# Patient Record
Sex: Male | Born: 1960 | Race: Black or African American | Hispanic: No | Marital: Single | Smoking: Current every day smoker
Health system: Southern US, Community
[De-identification: ages and names within clinical notes are randomized; demographics above are authoritative.]

## PROBLEM LIST (undated history)

## (undated) DIAGNOSIS — E872 Acidosis: Secondary | ICD-10-CM

## (undated) DIAGNOSIS — Z923 Personal history of irradiation: Secondary | ICD-10-CM

## (undated) DIAGNOSIS — J449 Chronic obstructive pulmonary disease, unspecified: Secondary | ICD-10-CM

## (undated) DIAGNOSIS — D649 Anemia, unspecified: Secondary | ICD-10-CM

## (undated) DIAGNOSIS — M1990 Unspecified osteoarthritis, unspecified site: Secondary | ICD-10-CM

## (undated) HISTORY — PX: LACERATION REPAIR: SHX5168

---
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
## 2011-10-17 ENCOUNTER — Emergency Department (HOSPITAL_COMMUNITY): Payer: Self-pay

## 2011-10-17 ENCOUNTER — Other Ambulatory Visit (HOSPITAL_COMMUNITY): Payer: Self-pay

## 2011-10-17 ENCOUNTER — Inpatient Hospital Stay (HOSPITAL_COMMUNITY)
Admission: EM | Admit: 2011-10-17 | Discharge: 2011-10-20 | DRG: 964 | Disposition: A | Discharge status: Home Health | Payer: Self-pay | Attending: General Surgery | Admitting: General Surgery

## 2011-10-17 DIAGNOSIS — S27329A Contusion of lung, unspecified, initial encounter: Principal | ICD-10-CM | POA: Diagnosis present

## 2011-10-17 DIAGNOSIS — IMO0002 Reserved for concepts with insufficient information to code with codable children: Secondary | ICD-10-CM

## 2011-10-17 DIAGNOSIS — F1010 Alcohol abuse, uncomplicated: Secondary | ICD-10-CM | POA: Diagnosis present

## 2011-10-17 DIAGNOSIS — S060X9A Concussion with loss of consciousness of unspecified duration, initial encounter: Secondary | ICD-10-CM | POA: Diagnosis present

## 2011-10-17 DIAGNOSIS — E041 Nontoxic single thyroid nodule: Secondary | ICD-10-CM | POA: Diagnosis present

## 2011-10-17 DIAGNOSIS — S7290XA Unspecified fracture of unspecified femur, initial encounter for closed fracture: Secondary | ICD-10-CM | POA: Diagnosis present

## 2011-10-17 DIAGNOSIS — D62 Acute posthemorrhagic anemia: Secondary | ICD-10-CM | POA: Diagnosis not present

## 2011-10-17 DIAGNOSIS — Z23 Encounter for immunization: Secondary | ICD-10-CM

## 2011-10-17 DIAGNOSIS — F17200 Nicotine dependence, unspecified, uncomplicated: Secondary | ICD-10-CM | POA: Diagnosis present

## 2011-10-17 DIAGNOSIS — Y92410 Unspecified street and highway as the place of occurrence of the external cause: Secondary | ICD-10-CM

## 2011-10-17 DIAGNOSIS — E876 Hypokalemia: Secondary | ICD-10-CM | POA: Diagnosis not present

## 2011-10-17 DIAGNOSIS — Y9301 Activity, walking, marching and hiking: Secondary | ICD-10-CM

## 2011-10-17 DIAGNOSIS — E871 Hypo-osmolality and hyponatremia: Secondary | ICD-10-CM | POA: Diagnosis not present

## 2011-10-17 LAB — ABO/RH: ABO/RH(D): O POS

## 2011-10-17 LAB — PROTIME-INR
INR: 1.02 (ref 0.00–1.49)
Prothrombin Time: 13.6 seconds (ref 11.6–15.2)

## 2011-10-17 LAB — CBC
HCT: 33.6 % — ABNORMAL LOW (ref 39.0–52.0)
Hemoglobin: 11.2 g/dL — ABNORMAL LOW (ref 13.0–17.0)
MCH: 31.5 pg (ref 26.0–34.0)
Platelets: 282 10*3/uL (ref 150–400)
RDW: 11.7 % (ref 11.5–15.5)
WBC: 5.2 10*3/uL (ref 4.0–10.5)

## 2011-10-17 LAB — POCT I-STAT, CHEM 8
Calcium, Ion: 1.1 mmol/L — ABNORMAL LOW (ref 1.12–1.32)
Chloride: 102 mEq/L (ref 96–112)
Creatinine, Ser: 1.3 mg/dL (ref 0.50–1.35)
HCT: 38 % — ABNORMAL LOW (ref 39.0–52.0)
Hemoglobin: 12.9 g/dL — ABNORMAL LOW (ref 13.0–17.0)
Potassium: 3.6 mEq/L (ref 3.5–5.1)
TCO2: 24 mmol/L (ref 0–100)

## 2011-10-17 LAB — RAPID URINE DRUG SCREEN, HOSP PERFORMED
Amphetamines: NOT DETECTED
Benzodiazepines: NOT DETECTED
Tetrahydrocannabinol: POSITIVE — AB

## 2011-10-17 LAB — URINALYSIS, ROUTINE W REFLEX MICROSCOPIC
Ketones, ur: NEGATIVE mg/dL
Leukocytes, UA: NEGATIVE
Nitrite: NEGATIVE
Protein, ur: NEGATIVE mg/dL
Urobilinogen, UA: 0.2 mg/dL (ref 0.0–1.0)

## 2011-10-17 LAB — COMPREHENSIVE METABOLIC PANEL
ALT: 14 U/L (ref 0–53)
AST: 25 U/L (ref 0–37)
BUN: 10 mg/dL (ref 6–23)
CO2: 25 mEq/L (ref 19–32)
Calcium: 8.8 mg/dL (ref 8.4–10.5)
Creatinine, Ser: 0.93 mg/dL (ref 0.50–1.35)
GFR calc Af Amer: >90 mL/min (ref >90)
GFR calc non Af Amer: >90 mL/min (ref >90)
Glucose, Bld: 120 mg/dL — ABNORMAL HIGH (ref 70–99)

## 2011-10-17 LAB — URINE MICROSCOPIC-ADD ON

## 2011-10-17 LAB — LACTIC ACID, PLASMA: Lactic Acid, Venous: 1.6 mmol/L (ref 0.5–2.2)

## 2011-10-17 LAB — TYPE AND SCREEN: ABO/RH(D): O POS

## 2011-10-17 MED ORDER — IOHEXOL 300 MG/ML  SOLN
100.0000 mL | Freq: Once | INTRAMUSCULAR | Status: AC | PRN
  Administered 2011-10-17: 100 mL via INTRAVENOUS

## 2011-10-18 ENCOUNTER — Emergency Department (HOSPITAL_COMMUNITY): Payer: Self-pay

## 2011-10-18 ENCOUNTER — Inpatient Hospital Stay (HOSPITAL_COMMUNITY): Payer: Self-pay

## 2011-10-18 LAB — BASIC METABOLIC PANEL
CO2: 27 mEq/L (ref 19–32)
Calcium: 8.1 mg/dL — ABNORMAL LOW (ref 8.4–10.5)
Chloride: 100 mEq/L (ref 96–112)
Creatinine, Ser: 0.79 mg/dL (ref 0.50–1.35)
GFR calc Af Amer: >90 mL/min (ref >90)
Sodium: 135 mEq/L (ref 135–145)

## 2011-10-18 LAB — CBC
Platelets: 241 10*3/uL (ref 150–400)
RBC: 3.13 MIL/uL — ABNORMAL LOW (ref 4.22–5.81)
RDW: 11.6 % (ref 11.5–15.5)
WBC: 6.6 10*3/uL (ref 4.0–10.5)

## 2011-10-19 LAB — CBC
HCT: 27 % — ABNORMAL LOW (ref 39.0–52.0)
MCV: 94.7 fL (ref 78.0–100.0)
Platelets: 225 10*3/uL (ref 150–400)
RBC: 2.85 MIL/uL — ABNORMAL LOW (ref 4.22–5.81)
RDW: 11.6 % (ref 11.5–15.5)
WBC: 5.1 10*3/uL (ref 4.0–10.5)

## 2011-10-19 LAB — BASIC METABOLIC PANEL
BUN: 7 mg/dL (ref 6–23)
CO2: 28 mEq/L (ref 19–32)
Chloride: 95 mEq/L — ABNORMAL LOW (ref 96–112)
Creatinine, Ser: 0.71 mg/dL (ref 0.50–1.35)
GFR calc Af Amer: >90 mL/min (ref >90)
Potassium: 3.3 mEq/L — ABNORMAL LOW (ref 3.5–5.1)

## 2011-10-20 LAB — BASIC METABOLIC PANEL
CO2: 27 mEq/L (ref 19–32)
Calcium: 8.1 mg/dL — ABNORMAL LOW (ref 8.4–10.5)
Chloride: 99 mEq/L (ref 96–112)
Glucose, Bld: 129 mg/dL — ABNORMAL HIGH (ref 70–99)
Sodium: 133 mEq/L — ABNORMAL LOW (ref 135–145)

## 2011-10-20 LAB — CBC
HCT: 25.4 % — ABNORMAL LOW (ref 39.0–52.0)
MCH: 32.5 pg (ref 26.0–34.0)
MCV: 93.7 fL (ref 78.0–100.0)
Platelets: 239 10*3/uL (ref 150–400)
RBC: 2.71 MIL/uL — ABNORMAL LOW (ref 4.22–5.81)
WBC: 6.3 10*3/uL (ref 4.0–10.5)
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
## 2011-10-30 ENCOUNTER — Telehealth (INDEPENDENT_AMBULATORY_CARE_PROVIDER_SITE_OTHER): Payer: Self-pay | Admitting: Physician Assistant

## 2011-10-30 NOTE — Telephone Encounter (Signed)
Pt states he is having pain in ribs and in leg, and is coughing up some rusty colored sputum. He has not been running a fever and does not feel like like he is getting pneumonia. He has a Rx for pain medication, but cannot afford to buy it and wonders if we could either give him some medication or help pay for it. I let him know that we could not help him this way, but that he could apply for emergency assistance at social services. The pt will call us back should we be able to do anything further for him

## 2011-11-03 ENCOUNTER — Telehealth (INDEPENDENT_AMBULATORY_CARE_PROVIDER_SITE_OTHER): Payer: Self-pay | Admitting: General Surgery

## 2011-11-03 NOTE — Telephone Encounter (Signed)
Perry Waller of [facility] called, requested a verbal order for social work from Dr. Lindie Spruce, pt wanted to apply of medicaid at this time. Trey Paula asked if he could be called back at [phone].

## 2011-11-03 NOTE — Telephone Encounter (Signed)
I called Trey Paula back from [facility] and gave verbal order to go out for social visit to discuss medicaid to help with medicine expense.

## 2011-11-06 ENCOUNTER — Emergency Department (HOSPITAL_COMMUNITY): Payer: Self-pay

## 2011-11-06 ENCOUNTER — Emergency Department (HOSPITAL_COMMUNITY)
Admission: EM | Admit: 2011-11-06 | Discharge: 2011-11-06 | Disposition: A | Discharge status: Home / Self Care | Payer: Self-pay | Attending: Emergency Medicine | Admitting: Emergency Medicine

## 2011-11-06 ENCOUNTER — Encounter: Payer: Self-pay | Admitting: Emergency Medicine

## 2011-11-06 DIAGNOSIS — S2242XA Multiple fractures of ribs, left side, initial encounter for closed fracture: Secondary | ICD-10-CM

## 2011-11-06 DIAGNOSIS — R079 Chest pain, unspecified: Secondary | ICD-10-CM | POA: Insufficient documentation

## 2011-11-06 DIAGNOSIS — S2249XA Multiple fractures of ribs, unspecified side, initial encounter for closed fracture: Secondary | ICD-10-CM | POA: Insufficient documentation

## 2011-11-06 DIAGNOSIS — IMO0002 Reserved for concepts with insufficient information to code with codable children: Secondary | ICD-10-CM | POA: Insufficient documentation

## 2011-11-06 DIAGNOSIS — M79609 Pain in unspecified limb: Secondary | ICD-10-CM | POA: Insufficient documentation

## 2011-11-06 MED ORDER — OXYCODONE-ACETAMINOPHEN 5-325 MG PO TABS
1.0000 | ORAL_TABLET | Freq: Once | ORAL | Status: AC
  Administered 2011-11-06: 1 via ORAL
  Filled 2011-11-06: qty 1

## 2011-11-06 MED ORDER — OXYCODONE-ACETAMINOPHEN 7.5-325 MG PO TABS: 1.0000 | ORAL_TABLET | Freq: Three times a day (TID) | ORAL | Status: DC | PRN
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
## 2012-07-06 IMAGING — CR DG RIBS W/ CHEST 3+V*L*
4 series · 4 of 4 positions shown · non-contrast
Comparison: 10/26/2011

CLINICAL DATA: Left rib pain

LEFT RIBS AND CHEST - 3+ VIEW

[w chest pa]
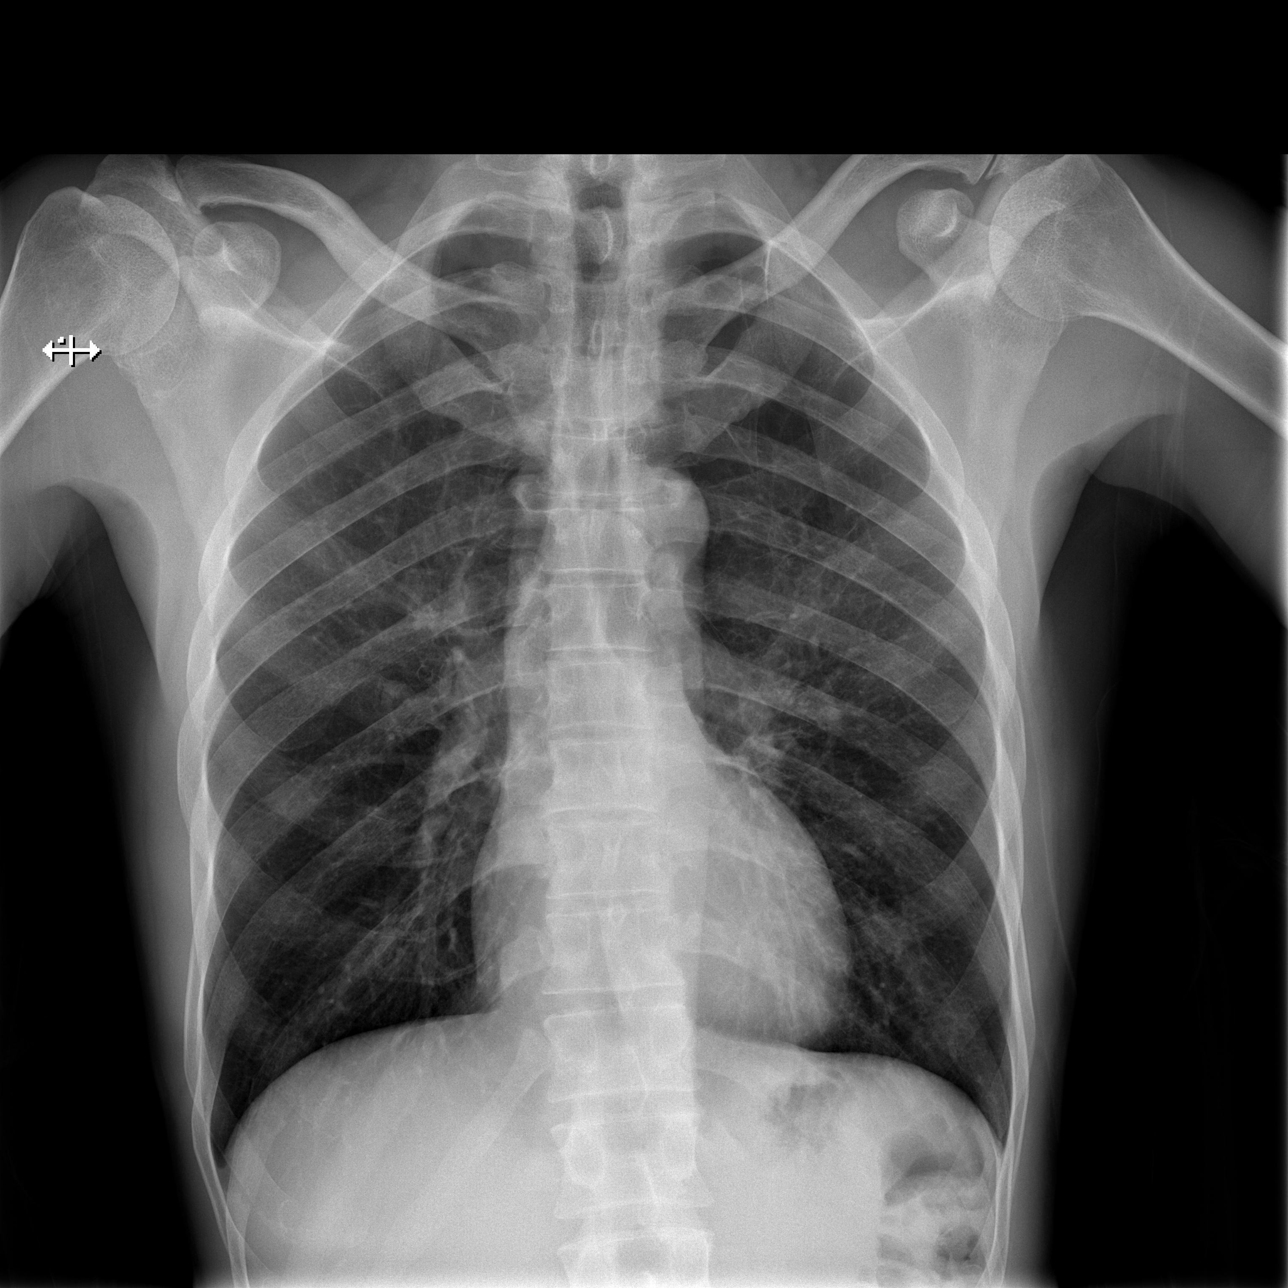

[w ribs ap upper left]
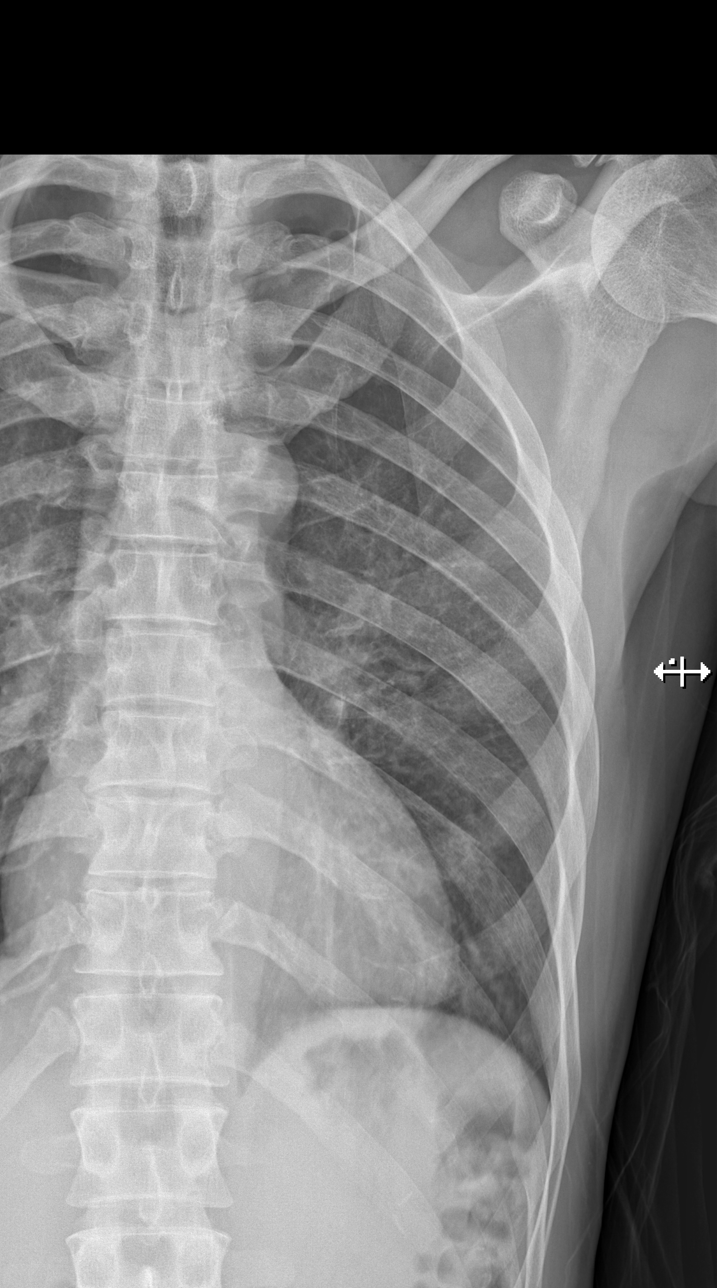

[w ribs ap lower left]
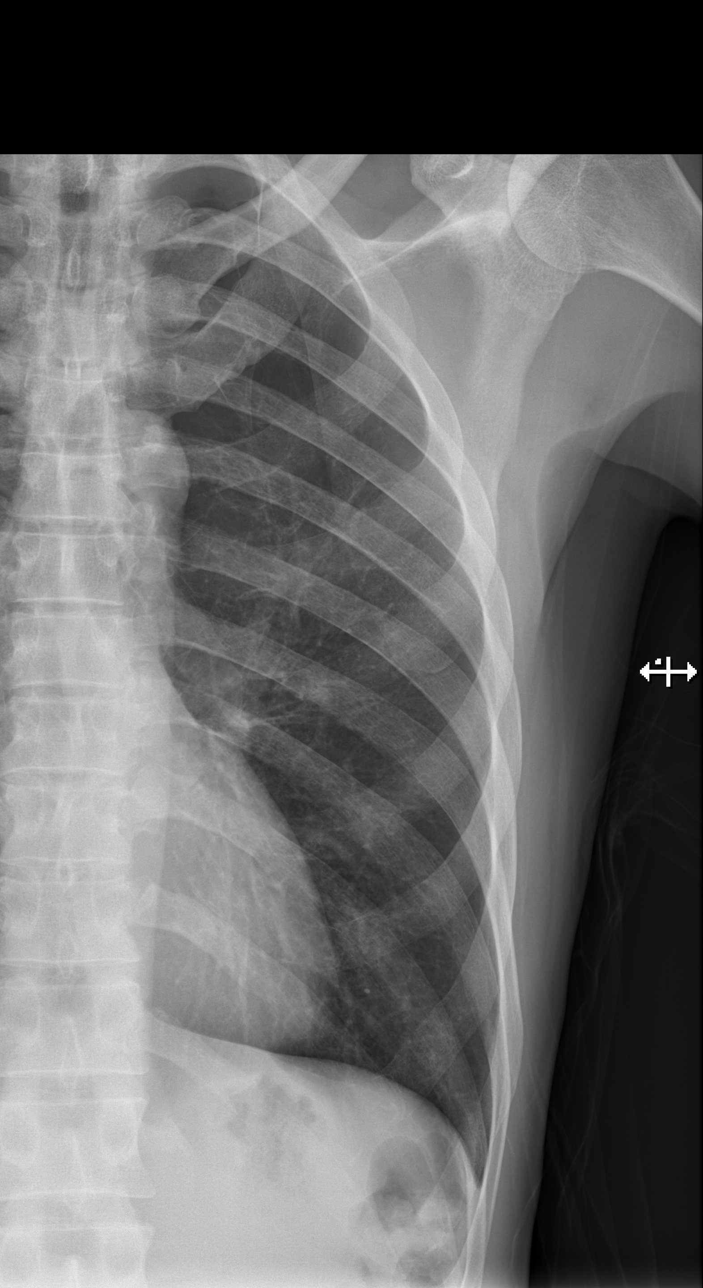

[w ribs obl left]
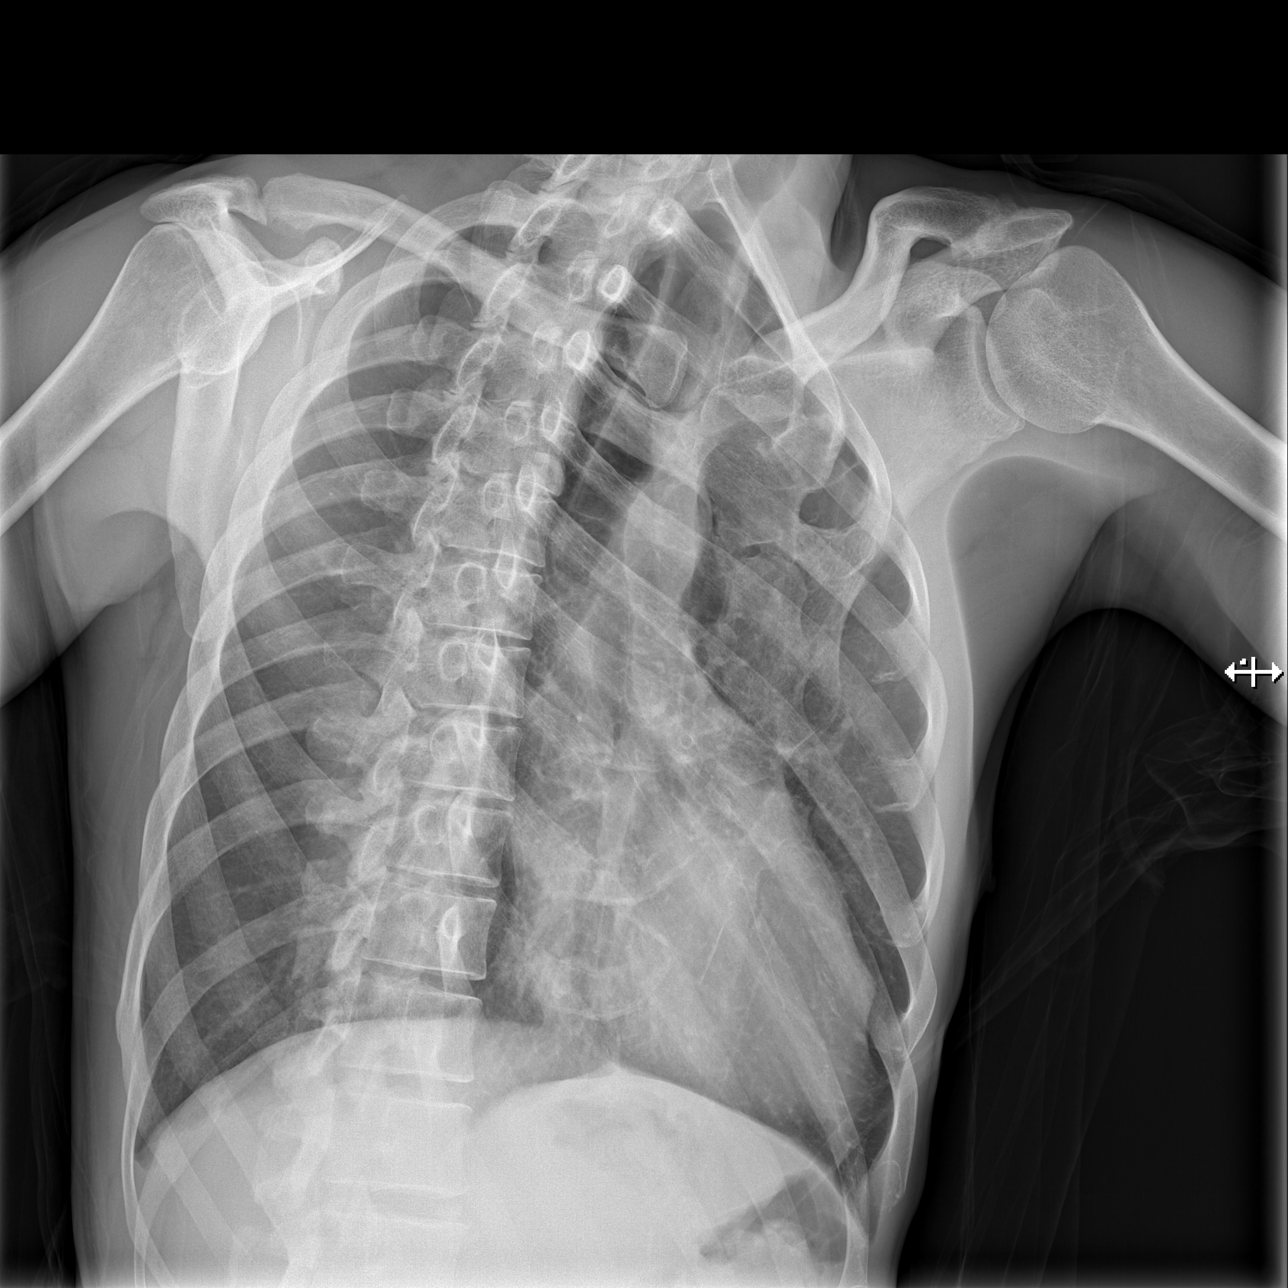

[4 of 4 positions shown; findings below may reference images not displayed]

FINDINGS: No pneumothorax.  No effusion.  Lungs clear.  Heart size
normal.  Minimally-displaced fractures of the posterolateral aspect
left ninth and tenth ribs.
IMPRESSION: 1.  Left ninth and tenth rib fractures without pneumothorax or
effusion.

## 2019-05-17 ENCOUNTER — Other Ambulatory Visit: Payer: Self-pay

## 2019-05-17 ENCOUNTER — Ambulatory Visit (INDEPENDENT_AMBULATORY_CARE_PROVIDER_SITE_OTHER): Payer: Medicaid Other

## 2019-05-17 ENCOUNTER — Ambulatory Visit (HOSPITAL_COMMUNITY)
Admission: EM | Admit: 2019-05-17 | Discharge: 2019-05-17 | Disposition: A | Discharge status: Home / Self Care | Payer: Medicaid Other | Attending: Family Medicine | Admitting: Family Medicine

## 2019-05-17 ENCOUNTER — Encounter (HOSPITAL_COMMUNITY): Payer: Self-pay | Admitting: Emergency Medicine

## 2019-05-17 DIAGNOSIS — J069 Acute upper respiratory infection, unspecified: Secondary | ICD-10-CM

## 2019-05-17 DIAGNOSIS — R05 Cough: Secondary | ICD-10-CM | POA: Diagnosis not present

## 2019-05-17 DIAGNOSIS — B9789 Other viral agents as the cause of diseases classified elsewhere: Secondary | ICD-10-CM

## 2019-05-17 MED ORDER — BENZONATATE 100 MG PO CAPS: ORAL_CAPSULE | ORAL | 0 refills | Status: DC

## 2019-05-17 NOTE — ED Provider Notes (Signed)
[name]   [id number] 05/17/19 Arrival Time: 6734  ASSESSMENT & PLAN:  1. Viral URI with cough    See AVS for discharge instructions.  I have personally viewed the imaging studies ordered this visit. No signs of pneumonia. Normal.  Meds ordered this encounter  Medications  . benzonatate (TESSALON) 100 MG capsule    Sig: Take 1 capsule by mouth every 8 (eight) hours for cough.    Dispense:  21 capsule    Refill:  0   Low suspicion for COVID-19. He does not wish to discuss any more details about his current symptoms; reports "I'm too hungry and have to leave." OTC symptom care as needed. Ensure adequate fluid intake and rest. May f/u with PCP or here as needed.  Reviewed expectations re: course of current medical issues. Questions answered. Outlined signs and symptoms indicating need for more acute intervention. Patient verbalized understanding. After Visit Summary given.   SUBJECTIVE: History from: patient.   Perry Waller is a 58 y.o. male who presents with complaint of nasal congestion, post-nasal drainage, and a persistent dry cough; without sore throat. Onset abrupt, a week ago; with mild fatigue and without body aches. SOB: "Maybe when I cough a lot and sometimes at night". Wheezing: none suspected. Fever: none suspected. Overall normal PO intake without n/v. Known sick contacts: no. No suspected COVID-19 exposure.No specific or significant aggravating or alleviating factors reported. OTC treatment: none reported.  Social History   Tobacco Use  Smoking Status Current Every Day Smoker  Cigarettes.  ROS: As per HPI. All other systems negative.   OBJECTIVE:  Vitals:   05/17/19 1117  BP: (!) 137/101  Pulse: 99  Resp: (!) 22  Temp: (!) 97.5 F (36.4 C)  TempSrc: Temporal  SpO2: 99%    Recheck RR: 18  General appearance: alert; no distress HEENT: mild nasal congestion; clear runny nose; throat irritation/cobblestoning; uvula midline Neck: supple  without LAD CV: RRR Lungs: unlabored respirations, symmetrical air entry without wheezing; cough: mild and dry Abd: soft; non-tender Ext: no LE edema Skin: warm and dry Neuro: normal ambulation Psychological: alert and cooperative; normal mood and affect  Imaging: Dg Chest 2 View  Result Date: 05/17/2019 CLINICAL DATA:  Cough. EXAM: CHEST - 2 VIEW COMPARISON:  Radiographs of November 06, 2011. FINDINGS: The heart size and mediastinal contours are within normal limits. Both lungs are clear. No pneumothorax or pleural effusion is noted. The visualized skeletal structures are unremarkable. IMPRESSION: No active cardiopulmonary disease. Electronically Signed   By: Marijo Conception M.D.   On: 05/17/2019 12:04    No Known Allergies  PMH: Rib fractures.  FH: Question of HTN.  Social History   Socioeconomic History  . Marital status: Single    Spouse name: Not on file  . Number of children: Not on file  . Years of education: Not on file  . Highest education level: Not on file  Occupational History  . Not on file  Social Needs  . Financial resource strain: Not on file  . Food insecurity:    Worry: Not on file    Inability: Not on file  . Transportation needs:    Medical: Not on file    Non-medical: Not on file  Tobacco Use  . Smoking status: Current Every Day Smoker  Substance and Sexual Activity  . Alcohol use: No  . Drug use: No  . Sexual activity: Not on file  Lifestyle  . Physical activity:    Days  per week: Not on file    Minutes per session: Not on file  . Stress: Not on file  Relationships  . Social connections:    Talks on phone: Not on file    Gets together: Not on file    Attends religious service: Not on file    Active member of club or organization: Not on file    Attends meetings of clubs or organizations: Not on file    Relationship status: Not on file  . Intimate partner violence:    Fear of current or ex partner: Not on file    Emotionally abused: Not  on file    Physically abused: Not on file    Forced sexual activity: Not on file  Other Topics Concern  . Not on file  Social History Narrative  . Not on file           Perry Kick, MD 05/17/19 1349

## 2019-05-17 NOTE — ED Triage Notes (Signed)
Pt here for cough and congestion with body aches and hoarse voice

## 2019-05-17 NOTE — Discharge Instructions (Signed)
Follow up with your primary care doctor or here if you are not seeing improvement of your symptoms over the next several days, sooner if you feel you are worsening.  Caring for yourself: Get plenty of rest. Drink plenty of fluids, enough so that your urine is light yellow or clear like water. If you have kidney, heart, or liver disease and have to limit fluids, talk with your doctor before you increase the amount of fluids you drink. Take an over-the-counter pain medicine if needed, such as acetaminophen (Tylenol), ibuprofen (Advil, Motrin), or naproxen (Aleve), to relieve fever, headache, and muscle aches. Read and follow all instructions on the label. No one younger than 20 should take aspirin. It has been linked to Reye syndrome, a serious illness. Before you use over the counter cough and cold medicines, check the label. These medicines may not be safe for children younger than age 48 or for people with certain health problems. If the skin around your nose and lips becomes sore, put some petroleum jelly on the area.  Avoid spreading a respiratory virus: Wash your hands regularly, and keep your hands away from your face.  Stay home from school, work, and other public places until you are feeling better and your fever has been gone for at least 24 hours. The fever needs to have gone away on its own without the help of medicine.

## 2019-06-16 ENCOUNTER — Emergency Department (HOSPITAL_COMMUNITY): Payer: Medicaid Other

## 2019-06-16 ENCOUNTER — Other Ambulatory Visit: Payer: Self-pay

## 2019-06-16 ENCOUNTER — Encounter (HOSPITAL_COMMUNITY): Payer: Self-pay | Admitting: Emergency Medicine

## 2019-06-16 ENCOUNTER — Emergency Department (HOSPITAL_COMMUNITY)
Admission: EM | Admit: 2019-06-16 | Discharge: 2019-06-16 | Disposition: A | Discharge status: Home / Self Care | Payer: Medicaid Other | Attending: Emergency Medicine | Admitting: Emergency Medicine

## 2019-06-16 DIAGNOSIS — R918 Other nonspecific abnormal finding of lung field: Secondary | ICD-10-CM | POA: Insufficient documentation

## 2019-06-16 DIAGNOSIS — R059 Cough, unspecified: Secondary | ICD-10-CM

## 2019-06-16 DIAGNOSIS — R05 Cough: Secondary | ICD-10-CM | POA: Diagnosis present

## 2019-06-16 DIAGNOSIS — R9389 Abnormal findings on diagnostic imaging of other specified body structures: Secondary | ICD-10-CM

## 2019-06-16 LAB — GROUP A STREP BY PCR: Group A Strep by PCR: NOT DETECTED

## 2019-06-16 MED ORDER — PREDNISONE 10 MG (21) PO TBPK: ORAL_TABLET | Freq: Every day | ORAL | 0 refills | Status: DC

## 2019-06-16 MED ORDER — PREDNISONE 20 MG PO TABS
60.0000 mg | ORAL_TABLET | Freq: Once | ORAL | Status: AC
Start: 2019-06-16 — End: 2019-06-16
  Administered 2019-06-16: 60 mg via ORAL
  Filled 2019-06-16: qty 3

## 2019-06-16 MED ORDER — ALBUTEROL SULFATE HFA 108 (90 BASE) MCG/ACT IN AERS
1.0000 | INHALATION_SPRAY | Freq: Once | RESPIRATORY_TRACT | Status: AC
  Administered 2019-06-16: 1 via RESPIRATORY_TRACT
  Filled 2019-06-16: qty 6.7

## 2019-06-16 MED ORDER — ALBUTEROL SULFATE (2.5 MG/3ML) 0.083% IN NEBU
5.0000 mg | INHALATION_SOLUTION | Freq: Once | RESPIRATORY_TRACT | Status: AC
Start: 2019-06-16 — End: 2019-06-16
  Administered 2019-06-16: 5 mg via RESPIRATORY_TRACT
  Filled 2019-06-16: qty 6

## 2019-06-16 NOTE — Discharge Instructions (Addendum)
You have an abnormality on the right side of your chest that was seen on your x-ray which could be cancer.  You need to have a CAT scan of your chest performed within the next 1 to 2 months to exclude this.

## 2019-06-16 NOTE — ED Provider Notes (Signed)
[facility] DEPT Provider Note   CSN: [id number] Arrival date & time: 06/16/19  2010     History   Chief Complaint Chief Complaint  Patient presents with  . Cough  . Hoarse  . Sore Throat    HPI Perry Waller is a 58 y.o. male.     58 year old male presents with 2 weeks of sore throat along with nonproductive cough and congestion.  Denies any fever or chills.  No trouble swallowing.  Has been using his albuterol inhaler without relief.  Does have a greater than 40-pack-year history tobacco use.  No hemoptysis.  No weight loss.  Denies any prior history of COPD.     History reviewed. No pertinent past medical history.  There are no active problems to display for this patient.   History reviewed. No pertinent surgical history.      Home Medications    Prior to Admission medications   Medication Sig Start Date End Date Taking? Authorizing Provider  benzonatate (TESSALON) 100 MG capsule Take 1 capsule by mouth every 8 (eight) hours for cough. 05/17/19   Vanessa Kick, MD  oxyCODONE-acetaminophen (PERCOCET) 7.5-325 MG per tablet Take 1 tablet by mouth every 8 (eight) hours as needed. For pain Patient not taking: Reported on 05/17/2019 11/06/11   Dorie Rank, MD    Family History No family history on file.  Social History Social History   Tobacco Use  . Smoking status: Current Every Day Smoker  . Smokeless tobacco: Never Used  Substance Use Topics  . Alcohol use: No  . Drug use: No     Allergies   Patient has no known allergies.   Review of Systems Review of Systems  All other systems reviewed and are negative.    Physical Exam Updated Vital Signs BP (!) 157/101 (BP Location: Right Arm)   Pulse 96   Temp 98.4 F (36.9 C) (Oral)   Resp 18   Ht 1.803 m (5\' 11" )   Wt 58.1 kg   SpO2 99%   BMI 17.85 kg/m   Physical Exam Vitals signs and nursing note reviewed.  Constitutional:      General: He is not in acute distress.    Appearance: Normal appearance. He is well-developed. He is not toxic-appearing.  HENT:     Head: Normocephalic and atraumatic.     Mouth/Throat:     Dentition: Dental caries present.  Eyes:     General: Lids are normal.     Conjunctiva/sclera: Conjunctivae normal.     Pupils: Pupils are equal, round, and reactive to light.  Neck:     Musculoskeletal: Normal range of motion and neck supple.     Thyroid: No thyroid mass.     Trachea: No tracheal deviation.  Cardiovascular:     Rate and Rhythm: Normal rate and regular rhythm.     Heart sounds: Normal heart sounds. No murmur. No gallop.   Pulmonary:     Effort: Pulmonary effort is normal. No respiratory distress.     Breath sounds: No stridor. Examination of the right-upper field reveals decreased breath sounds. Examination of the left-upper field reveals decreased breath sounds. Decreased breath sounds present. No wheezing, rhonchi or rales.  Abdominal:     General: Bowel sounds are normal. There is no distension.     Palpations: Abdomen is soft.     Tenderness: There is no abdominal tenderness. There is no rebound.  Musculoskeletal: Normal range of motion.  General: No tenderness.  Skin:    General: Skin is warm and dry.     Findings: No abrasion or rash.  Neurological:     Mental Status: He is alert and oriented to person, place, and time.     GCS: GCS eye subscore is 4. GCS verbal subscore is 5. GCS motor subscore is 6.     Cranial Nerves: No cranial nerve deficit.     Sensory: No sensory deficit.  Psychiatric:        Speech: Speech normal.        Behavior: Behavior normal.      ED Treatments / Results  Labs (all labs ordered are listed, but only abnormal results are displayed) Labs Reviewed  GROUP A STREP BY PCR    EKG    Radiology Dg Chest 2 View  Result Date: 06/16/2019 CLINICAL DATA:  Cough EXAM: CHEST - 2 VIEW COMPARISON:  05/17/2019 FINDINGS: Pulmonary hyperinflation. No pleural effusion. Normal  cardiomediastinal silhouette. Linear scarring in the right upper lobe. Bullous emphysematous disease. Possible small spiculated nodule in the right upper lobe. IMPRESSION: 1. Hyperinflation with emphysematous disease. No acute pulmonary infiltrate 2. Possible small spiculated right upper lobe lung nodule. CT chest could be obtained to further evaluate. Electronically Signed   By: Donavan Foil M.D.   On: 06/16/2019 21:37    Procedures Procedures (including critical care time)  Medications Ordered in ED Medications  predniSONE (DELTASONE) tablet 60 mg (has no administration in time range)  albuterol (VENTOLIN HFA) 108 (90 Base) MCG/ACT inhaler 1 puff (has no administration in time range)  albuterol (PROVENTIL) (2.5 MG/3ML) 0.083% nebulizer solution 5 mg (5 mg Nebulization Given 06/16/19 2044)     Initial Impression / Assessment and Plan / ED Course  I have reviewed the triage vital signs and the nursing notes.  Pertinent labs & imaging results that were available during my care of the patient were reviewed by me and considered in my medical decision making (see chart for details).       Rapid strep test was negative.  Chest x-ray does show a lung spicule and the possibility of lung cancer was explained to him in the importance of follow-up with a CT of the chest at a later date. Patient with mild expiratory wheezing here.  Has been treated with albuterol prior to me seeing the patient he feels much better.  Suspect mild COPD exacerbation and will be given albuterol HFA to go home with.  Was also dose of prednisone will be placed on taper.  Final Clinical Impressions(s) / ED Diagnoses   Final diagnoses:  None    ED Discharge Orders    None       Lacretia Leigh, MD 06/16/19 2154

## 2019-06-16 NOTE — ED Triage Notes (Signed)
Patient here from home with complaints of dry cough worse at night, sore throat, and wheezing x 1 month. Reports being seen at Urgent Care for same 1 month ago. No relief. Smoker. Denies COVID exposure.

## 2019-06-29 ENCOUNTER — Encounter (HOSPITAL_COMMUNITY): Payer: Self-pay | Admitting: *Deleted

## 2019-06-29 ENCOUNTER — Other Ambulatory Visit: Payer: Self-pay

## 2019-06-29 ENCOUNTER — Emergency Department (HOSPITAL_COMMUNITY): Payer: Medicaid Other

## 2019-06-29 ENCOUNTER — Observation Stay (HOSPITAL_COMMUNITY)
Admission: EM | Admit: 2019-06-29 | Discharge: 2019-06-30 | Disposition: A | Discharge status: Home / Self Care | Payer: Medicaid Other | Attending: Internal Medicine | Admitting: Internal Medicine

## 2019-06-29 DIAGNOSIS — J439 Emphysema, unspecified: Secondary | ICD-10-CM | POA: Insufficient documentation

## 2019-06-29 DIAGNOSIS — J441 Chronic obstructive pulmonary disease with (acute) exacerbation: Secondary | ICD-10-CM | POA: Diagnosis present

## 2019-06-29 DIAGNOSIS — J9601 Acute respiratory failure with hypoxia: Secondary | ICD-10-CM

## 2019-06-29 DIAGNOSIS — E872 Acidosis: Secondary | ICD-10-CM

## 2019-06-29 DIAGNOSIS — E871 Hypo-osmolality and hyponatremia: Secondary | ICD-10-CM

## 2019-06-29 DIAGNOSIS — I252 Old myocardial infarction: Secondary | ICD-10-CM | POA: Diagnosis not present

## 2019-06-29 DIAGNOSIS — R911 Solitary pulmonary nodule: Secondary | ICD-10-CM | POA: Diagnosis not present

## 2019-06-29 DIAGNOSIS — Z1159 Encounter for screening for other viral diseases: Secondary | ICD-10-CM | POA: Insufficient documentation

## 2019-06-29 DIAGNOSIS — R9431 Abnormal electrocardiogram [ECG] [EKG]: Secondary | ICD-10-CM | POA: Insufficient documentation

## 2019-06-29 DIAGNOSIS — E876 Hypokalemia: Secondary | ICD-10-CM

## 2019-06-29 DIAGNOSIS — R0603 Acute respiratory distress: Secondary | ICD-10-CM | POA: Diagnosis not present

## 2019-06-29 DIAGNOSIS — J449 Chronic obstructive pulmonary disease, unspecified: Secondary | ICD-10-CM

## 2019-06-29 DIAGNOSIS — F17210 Nicotine dependence, cigarettes, uncomplicated: Secondary | ICD-10-CM | POA: Insufficient documentation

## 2019-06-29 DIAGNOSIS — J9602 Acute respiratory failure with hypercapnia: Secondary | ICD-10-CM

## 2019-06-29 DIAGNOSIS — Z79899 Other long term (current) drug therapy: Secondary | ICD-10-CM | POA: Insufficient documentation

## 2019-06-29 HISTORY — DX: Unspecified osteoarthritis, unspecified site: M19.90

## 2019-06-29 HISTORY — DX: Acute respiratory failure with hypercapnia: J96.02

## 2019-06-29 HISTORY — DX: Acidosis: E87.2

## 2019-06-29 LAB — CBC WITH DIFFERENTIAL/PLATELET
Abs Immature Granulocytes: 0.1 10*3/uL — ABNORMAL HIGH (ref 0.00–0.07)
Basophils Absolute: 0.1 10*3/uL (ref 0.0–0.1)
Basophils Relative: 0 %
Eosinophils Absolute: 0.1 10*3/uL (ref 0.0–0.5)
Eosinophils Relative: 1 %
HCT: 42.2 % (ref 39.0–52.0)
Hemoglobin: 13.2 g/dL (ref 13.0–17.0)
Immature Granulocytes: 1 %
Lymphocytes Relative: 61 %
Lymphs Abs: 8 10*3/uL — ABNORMAL HIGH (ref 0.7–4.0)
MCH: 31 pg (ref 26.0–34.0)
MCHC: 31.3 g/dL (ref 30.0–36.0)
MCV: 99.1 fL (ref 80.0–100.0)
Monocytes Absolute: 0.9 10*3/uL (ref 0.1–1.0)
Monocytes Relative: 7 %
Neutro Abs: 3.9 10*3/uL (ref 1.7–7.7)
Neutrophils Relative %: 30 %
Platelets: 344 10*3/uL (ref 150–400)
RBC: 4.26 MIL/uL (ref 4.22–5.81)
RDW: 11.9 % (ref 11.5–15.5)
WBC: 13 10*3/uL — ABNORMAL HIGH (ref 4.0–10.5)
nRBC: 0 % (ref 0.0–0.2)

## 2019-06-29 LAB — MAGNESIUM: Magnesium: 1.8 mg/dL (ref 1.7–2.4)

## 2019-06-29 LAB — BASIC METABOLIC PANEL
Anion gap: 18 — ABNORMAL HIGH (ref 5–15)
BUN: 6 mg/dL (ref 6–20)
CO2: 22 mmol/L (ref 22–32)
Calcium: 8.8 mg/dL — ABNORMAL LOW (ref 8.9–10.3)
Chloride: 92 mmol/L — ABNORMAL LOW (ref 98–111)
Creatinine, Ser: 0.8 mg/dL (ref 0.61–1.24)
GFR calc Af Amer: >60 mL/min (ref >60)
GFR calc non Af Amer: >60 mL/min (ref >60)
Glucose, Bld: 181 mg/dL — ABNORMAL HIGH (ref 70–99)
Potassium: 3.1 mmol/L — ABNORMAL LOW (ref 3.5–5.1)
Sodium: 132 mmol/L — ABNORMAL LOW (ref 135–145)

## 2019-06-29 LAB — PATHOLOGIST SMEAR REVIEW

## 2019-06-29 LAB — HEMOGLOBIN A1C
Hgb A1c MFr Bld: 5.7 % — ABNORMAL HIGH (ref 4.8–5.6)
Mean Plasma Glucose: 116.89 mg/dL

## 2019-06-29 LAB — SARS CORONAVIRUS 2 BY RT PCR (HOSPITAL ORDER, PERFORMED IN ~~LOC~~ HOSPITAL LAB): SARS Coronavirus 2: NEGATIVE

## 2019-06-29 MED ORDER — SENNOSIDES-DOCUSATE SODIUM 8.6-50 MG PO TABS: 1.0000 | ORAL_TABLET | Freq: Every evening | ORAL | Status: DC | PRN

## 2019-06-29 MED ORDER — ENOXAPARIN SODIUM 40 MG/0.4ML ~~LOC~~ SOLN
40.0000 mg | SUBCUTANEOUS | Status: DC
  Administered 2019-06-29 – 2019-06-30 (×2): 40 mg via SUBCUTANEOUS
  Filled 2019-06-29 (×2): qty 0.4

## 2019-06-29 MED ORDER — ALBUTEROL SULFATE HFA 108 (90 BASE) MCG/ACT IN AERS
8.0000 | INHALATION_SPRAY | Freq: Once | RESPIRATORY_TRACT | Status: AC
  Administered 2019-06-29: 04:00:00 8 via RESPIRATORY_TRACT
  Filled 2019-06-29: qty 6.7

## 2019-06-29 MED ORDER — MAGNESIUM SULFATE 2 GM/50ML IV SOLN
2.0000 g | Freq: Once | INTRAVENOUS | Status: AC
  Administered 2019-06-29: 2 g via INTRAVENOUS
  Filled 2019-06-29: qty 50

## 2019-06-29 MED ORDER — IPRATROPIUM-ALBUTEROL 0.5-2.5 (3) MG/3ML IN SOLN
3.0000 mL | Freq: Four times a day (QID) | RESPIRATORY_TRACT | Status: DC
  Administered 2019-06-29 – 2019-06-30 (×6): 3 mL via RESPIRATORY_TRACT
  Filled 2019-06-29 (×6): qty 3

## 2019-06-29 MED ORDER — METHYLPREDNISOLONE SODIUM SUCC 125 MG IJ SOLR
125.0000 mg | Freq: Once | INTRAMUSCULAR | Status: AC
  Administered 2019-06-29: 05:00:00 125 mg via INTRAVENOUS
  Filled 2019-06-29: qty 2

## 2019-06-29 MED ORDER — AZITHROMYCIN 250 MG PO TABS
250.0000 mg | ORAL_TABLET | Freq: Every day | ORAL | Status: DC
  Administered 2019-06-30: 250 mg via ORAL
  Filled 2019-06-29: qty 1

## 2019-06-29 MED ORDER — IPRATROPIUM-ALBUTEROL 0.5-2.5 (3) MG/3ML IN SOLN: 3.0000 mL | Freq: Three times a day (TID) | RESPIRATORY_TRACT | Status: DC | PRN

## 2019-06-29 MED ORDER — IPRATROPIUM-ALBUTEROL 0.5-2.5 (3) MG/3ML IN SOLN
3.0000 mL | Freq: Once | RESPIRATORY_TRACT | Status: AC
  Administered 2019-06-29: 3 mL via RESPIRATORY_TRACT
  Filled 2019-06-29: qty 3

## 2019-06-29 MED ORDER — PREDNISONE 20 MG PO TABS
40.0000 mg | ORAL_TABLET | Freq: Every day | ORAL | Status: DC
  Administered 2019-06-30: 40 mg via ORAL
  Filled 2019-06-29: qty 2

## 2019-06-29 MED ORDER — ALBUTEROL SULFATE HFA 108 (90 BASE) MCG/ACT IN AERS
8.0000 | INHALATION_SPRAY | Freq: Once | RESPIRATORY_TRACT | Status: AC
  Administered 2019-06-29: 8 via RESPIRATORY_TRACT

## 2019-06-29 MED ORDER — POTASSIUM CHLORIDE CRYS ER 20 MEQ PO TBCR
40.0000 meq | EXTENDED_RELEASE_TABLET | Freq: Once | ORAL | Status: AC
  Administered 2019-06-29: 40 meq via ORAL
  Filled 2019-06-29: qty 2

## 2019-06-29 MED ORDER — DM-GUAIFENESIN ER 30-600 MG PO TB12
1.0000 | ORAL_TABLET | Freq: Two times a day (BID) | ORAL | Status: DC
  Administered 2019-06-29 – 2019-06-30 (×3): 1 via ORAL
  Filled 2019-06-29 (×3): qty 1

## 2019-06-29 MED ORDER — AEROCHAMBER PLUS FLO-VU LARGE MISC
Status: AC
  Administered 2019-06-29: 04:00:00
  Filled 2019-06-29: qty 1

## 2019-06-29 MED ORDER — ACETAMINOPHEN 325 MG PO TABS
650.0000 mg | ORAL_TABLET | Freq: Four times a day (QID) | ORAL | Status: DC | PRN
  Administered 2019-06-29: 650 mg via ORAL
  Filled 2019-06-29: qty 2

## 2019-06-29 MED ORDER — EPINEPHRINE 0.3 MG/0.3ML IJ SOAJ
0.3000 mg | Freq: Once | INTRAMUSCULAR | Status: AC
  Administered 2019-06-29: 0.3 mg via INTRAMUSCULAR
  Filled 2019-06-29: qty 0.3

## 2019-06-29 MED ORDER — PROMETHAZINE HCL 25 MG PO TABS: 12.5000 mg | ORAL_TABLET | Freq: Four times a day (QID) | ORAL | Status: DC | PRN

## 2019-06-29 MED ORDER — ORAL CARE MOUTH RINSE
15.0000 mL | Freq: Two times a day (BID) | OROMUCOSAL | Status: DC
  Administered 2019-06-29 – 2019-06-30 (×2): 15 mL via OROMUCOSAL

## 2019-06-29 MED ORDER — PHENOL 1.4 % MT LIQD
1.0000 | OROMUCOSAL | Status: DC | PRN
  Administered 2019-06-29: 1 via OROMUCOSAL
  Filled 2019-06-29: qty 177

## 2019-06-29 MED ORDER — ACETAMINOPHEN 650 MG RE SUPP: 650.0000 mg | Freq: Four times a day (QID) | RECTAL | Status: DC | PRN

## 2019-06-29 MED ORDER — AZITHROMYCIN 250 MG PO TABS
500.0000 mg | ORAL_TABLET | Freq: Once | ORAL | Status: AC
  Administered 2019-06-29: 500 mg via ORAL
  Filled 2019-06-29: qty 2

## 2019-06-29 NOTE — ED Notes (Addendum)
ED TO INPATIENT HANDOFF REPORT  ED Nurse Name and Phone #:   S Name/Age/Gender Tempie Hoist 58 y.o. male Room/Bed: 024C/024C  Code Status   Code Status: Full Code  Home/SNF/Other Home Patient oriented to: self and situation Is this baseline? Yes   Triage Complete: Triage complete  Chief Complaint sick  Triage Note Pt arrives via POV with onset of SOB that awoke him from sleep this morning. Obvious SOB, pt says he used his inhaler and has taken his steriods at home as prescribed   Allergies No Known Allergies  Level of Care/Admitting Diagnosis ED Disposition    ED Disposition Condition [place]: [facility] [[id number]]  Level of Care: Med-Surg [16]  Covid Evaluation: Confirmed COVID Negative  Diagnosis: Acute respiratory distress [329924]  Admitting Physician: Aline Brochure  Attending Physician: Larey Dresser A [[id number]]  PT Class (Do Not Modify): Observation [104]  PT Acc Code (Do Not Modify): Observation [10022]       B Medical/Surgery History History reviewed. No pertinent past medical history. History reviewed. No pertinent surgical history.   A IV Location/Drains/Wounds Patient Lines/Drains/Airways Status   Active Line/Drains/Airways    Name:   Placement date:   Placement time:   Site:   Days:   Peripheral IV 06/29/19 Right;Upper Arm   06/29/19    0414    Arm   less than 1          Intake/Output Last 24 hours No intake or output data in the 24 hours ending 06/29/19 0946  Labs/Imaging Results for orders placed or performed during the hospital encounter of 06/29/19 (from the past 48 hour(s))  SARS Coronavirus 2 (CEPHEID - Performed in [facility] hospital lab), Hosp Order     Status: None   Collection Time: 06/29/19  4:14 AM   Specimen: Nasopharyngeal Swab  Result Value Ref Range   SARS Coronavirus 2 NEGATIVE NEGATIVE    Comment: (NOTE) If result is NEGATIVE SARS-CoV-2 target nucleic acids are  NOT DETECTED. The SARS-CoV-2 RNA is generally detectable in upper and lower  respiratory specimens during the acute phase of infection. The lowest  concentration of SARS-CoV-2 viral copies this assay can detect is 250  copies / mL. A negative result does not preclude SARS-CoV-2 infection  and should not be used as the sole basis for treatment or other  patient management decisions.  A negative result may occur with  improper specimen collection / handling, submission of specimen other  than nasopharyngeal swab, presence of viral mutation(s) within the  areas targeted by this assay, and inadequate number of viral copies  (<250 copies / mL). A negative result must be combined with clinical  observations, patient history, and epidemiological information. If result is POSITIVE SARS-CoV-2 target nucleic acids are DETECTED. The SARS-CoV-2 RNA is generally detectable in upper and lower  respiratory specimens dur ing the acute phase of infection.  Positive  results are indicative of active infection with SARS-CoV-2.  Clinical  correlation with patient history and other diagnostic information is  necessary to determine patient infection status.  Positive results do  not rule out bacterial infection or co-infection with other viruses. If result is PRESUMPTIVE POSTIVE SARS-CoV-2 nucleic acids MAY BE PRESENT.   A presumptive positive result was obtained on the submitted specimen  and confirmed on repeat testing.  While 2019 novel coronavirus  (SARS-CoV-2) nucleic acids may be present in the submitted sample  additional confirmatory testing may be necessary  for epidemiological  and / or clinical management purposes  to differentiate between  SARS-CoV-2 and other Sarbecovirus currently known to infect humans.  If clinically indicated additional testing with an alternate test  methodology [phone]) is advised. The SARS-CoV-2 RNA is generally  detectable in upper and lower respiratory sp ecimens  during the acute  phase of infection. The expected result is Negative. Fact Sheet for Patients:  StrictlyIdeas.no Fact Sheet for Healthcare Providers: BankingDealers.co.za This test is not yet approved or cleared by the [place] FDA and has been authorized for detection and/or diagnosis of SARS-CoV-2 by FDA under an Emergency Use Authorization (EUA).  This EUA will remain in effect (meaning this test can be used) for the duration of the COVID-19 declaration under Section 564(b)(1) of the Act, 21 U.S.C. section 360bbb-3(b)(1), unless the authorization is terminated or revoked sooner. Performed at [facility], [place] [address] [place], [place] [address]   Basic metabolic panel     Status: Abnormal   Collection Time: 06/29/19  4:19 AM  Result Value Ref Range   Sodium 132 (L) 135 - 145 mmol/L   Potassium 3.1 (L) 3.5 - 5.1 mmol/L   Chloride 92 (L) 98 - 111 mmol/L   CO2 22 22 - 32 mmol/L   Glucose, Bld 181 (H) 70 - 99 mg/dL   BUN 6 6 - 20 mg/dL   Creatinine, Ser 0.80 0.61 - 1.24 mg/dL   Calcium 8.8 (L) 8.9 - 10.3 mg/dL   GFR calc non Af Amer >60 >60 mL/min   GFR calc Af Amer >60 >60 mL/min   Anion gap 18 (H) 5 - 15    Comment: Performed at [facility] [address] [place], [place] [address]  CBC with Differential     Status: Abnormal   Collection Time: 06/29/19  4:19 AM  Result Value Ref Range   WBC 13.0 (H) 4.0 - 10.5 K/uL   RBC 4.26 4.22 - 5.81 MIL/uL   Hemoglobin 13.2 13.0 - 17.0 g/dL   HCT 42.2 39.0 - 52.0 %   MCV 99.1 80.0 - 100.0 fL   MCH 31.0 26.0 - 34.0 pg   MCHC 31.3 30.0 - 36.0 g/dL   RDW 11.9 11.5 - 15.5 %   Platelets 344 150 - 400 K/uL   nRBC 0.0 0.0 - 0.2 %   Neutrophils Relative % 30 %   Neutro Abs 3.9 1.7 - 7.7 K/uL   Lymphocytes Relative 61 %   Lymphs Abs 8.0 (H) 0.7 - 4.0 K/uL   Monocytes Relative 7 %   Monocytes Absolute 0.9 0.1 - 1.0 K/uL   Eosinophils Relative 1 %   Eosinophils  Absolute 0.1 0.0 - 0.5 K/uL   Basophils Relative 0 %   Basophils Absolute 0.1 0.0 - 0.1 K/uL   WBC Morphology ABSOLUTE LYMPHOCYTOSIS     Comment: MILD LEFT SHIFT (1-5% METAS, OCC MYELO, OCC BANDS)   Immature Granulocytes 1 %   Abs Immature Granulocytes 0.10 (H) 0.00 - 0.07 K/uL    Comment: Performed at [facility], [place] [address] [place], [place] [address]  Magnesium     Status: None   Collection Time: 06/29/19  4:19 AM  Result Value Ref Range   Magnesium 1.8 1.7 - 2.4 mg/dL    Comment: Performed at [facility] [address] [place], [place] [address]   Dg Chest Port 1 View  Result Date: 06/29/2019 CLINICAL DATA:  Shortness of breath EXAM: PORTABLE  CHEST 1 VIEW COMPARISON:  06/16/2019 FINDINGS: Normal heart size and mediastinal contours. 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 Chronic mild right upper lobe scarring. No acute infiltrate or edema. No effusion or pneumothorax. No acute osseous findings. IMPRESSION: No acute finding. Emphysema. Electronically Signed   By: Monte Fantasia M.D.   On: 06/29/2019 05:20    Pending Labs Unresulted Labs (From admission, onward)    Start     Ordered   06/30/19 9562  Basic metabolic panel  Tomorrow morning,   R     06/29/19 0833   06/30/19 0500  CBC  Tomorrow morning,   R     06/29/19 0833   06/29/19 0828  HIV antibody (Routine Testing)  Once,   STAT     06/29/19 0833   06/29/19 0419  Pathologist smear review  Once,   STAT     06/29/19 0419          Vitals/Pain Today's Vitals   06/29/19 0830 06/29/19 0845 06/29/19 0900 06/29/19 0922  BP: (!) 147/98 (!) 149/97    Pulse:      Resp: 19 18    Temp:      TempSrc:      SpO2:   100% 100%  PainSc:        Isolation Precautions No active isolations  Medications Medications  enoxaparin (LOVENOX) injection 40 mg (has no administration in time range)  acetaminophen (TYLENOL) tablet 650 mg (has no administration in time range)    Or  acetaminophen (TYLENOL) suppository  650 mg (has no administration in time range)  senna-docusate (Senokot-S) tablet 1 tablet (has no administration in time range)  promethazine (PHENERGAN) tablet 12.5 mg (has no administration in time range)  azithromycin (ZITHROMAX) tablet 500 mg (has no administration in time range)    Followed by  azithromycin (ZITHROMAX) tablet 250 mg (has no administration in time range)  predniSONE (DELTASONE) tablet 40 mg (has no administration in time range)  ipratropium-albuterol (DUONEB) 0.5-2.5 (3) MG/3ML nebulizer solution 3 mL (3 mLs Nebulization Given 06/29/19 0921)  dextromethorphan-guaiFENesin (MUCINEX DM) 30-600 MG per 12 hr tablet 1 tablet (has no administration in time range)  magnesium sulfate IVPB 2 g 50 mL (0 g Intravenous Stopped 06/29/19 0518)  albuterol (VENTOLIN HFA) 108 (90 Base) MCG/ACT inhaler 8 puff (8 puffs Inhalation Given 06/29/19 0426)  methylPREDNISolone sodium succinate (SOLU-MEDROL) 125 mg/2 mL injection 125 mg (125 mg Intravenous Given 06/29/19 0433)  AeroChamber Plus Flo-Vu Large MISC (  Given 06/29/19 0426)  EPINEPHrine (EPI-PEN) injection 0.3 mg (0.3 mg Intramuscular Given 06/29/19 0515)  albuterol (VENTOLIN HFA) 108 (90 Base) MCG/ACT inhaler 8 puff (8 puffs Inhalation Given 06/29/19 0517)  potassium chloride SA (K-DUR) CR tablet 40 mEq (40 mEq Oral Given 06/29/19 0725)  ipratropium-albuterol (DUONEB) 0.5-2.5 (3) MG/3ML nebulizer solution 3 mL (3 mLs Nebulization Given 06/29/19 0724)    Mobility walks Low fall risk   Focused Assessments Pulmonary Assessment Handoff:  Lung sounds: Bilateral Breath Sounds: Expiratory wheezes L Breath Sounds: Stridor R Breath Sounds: Stridor O2 Device: Nasal Cannula O2 Flow Rate (L/min): 3 L/min      R Recommendations: See Admitting Provider Note  Report given to:   Additional Notes:  [name] is a 58 y.o male with tobacco use disorder who presents with dyspnea, productive cough, wheezing, and in respiratory distress. Treating for presumed  copd exacerbation.   Na=132, K=3.1, Mg 1.8 Anion gap 18 WBC 13

## 2019-06-29 NOTE — ED Triage Notes (Signed)
Pt arrives via POV with onset of SOB that awoke him from sleep this morning. Obvious SOB, pt says he used his inhaler and has taken his steriods at home as prescribed

## 2019-06-29 NOTE — ED Notes (Signed)
[place]

## 2019-06-29 NOTE — H&P (Addendum)
Date: 06/29/2019               Patient Name:  Rosaire Cueto MRN: [id number]  DOB: [birth year] Age / Sex: 58 y.o., male   PCP: Patient, No Pcp Per         Medical Service: Internal Medicine Teaching Service         Attending Physician: Dr. Bartholomew Crews, MD    First Contact: Dr. Benjamine Mola Pager: [phone]  Second Contact: Dr. Isac Sarna Pager: [phone]       After Hours (After 5p/  First Contact Pager: [phone]  weekends / holidays): Second Contact Pager: [phone]   Chief Complaint: Dyspnea   History of Present Illness:  History was elicited from patient, but it was difficult to understand him due to severe hoarseness.   Mr. Dudash is a 58 y.o male with tobacco use disorder who presented with severe dyspnea that woke him up from sleep one day prior to admission 6/30. He has been having accompanied chills, sweats, and productive cough bringing up white colored sputum.  He also has a hoarse voice since Saturday June 27th. He used an albuterol inhaler at home but states that it did not give him any relief.   He denies any sick contacts, fevers, muscle aches. States that he has seasonal allergies to pollen that he uses flonase for.   The patient does not have an official diagnosis of copd  And states that he has not had pfts done in the past.   Of note, the patient had an ED visit on 6/18 for sore throat, cough, and congestion that was treated as a mild copd exacerbation. He was given an albuterol inhaler to go home with along with a 12 day prednisone taper. The patient states he took all of his prednisone as instructed and required albuterol inhaler 2-3times daily, which caused him to run out day prior to admission.  In the ED the patient had normal pulse, tachypneic (20-30s), saturating at 100% on 2L via [place]. He was given a dose of solumedrol, epinephrine , and albuterol. His potassium and magnesium were also repleted.   Meds:  No outpatient medications have been marked as taking for  the 06/29/19 encounter [facility] Encounter).    Allergies: Allergies as of 06/29/2019  . (No Known Allergies)   History reviewed. No pertinent past medical history.  Family History:   None that he knows of   Social History:  Recently moved to [place], unable to understand from where  Lives with sister  1ppd tobacco use for 30 yrs  Drinks 1 pint of vodka on weekends No other substance use history   Review of Systems: A complete ROS was negative except as per HPI.   Physical Exam: Blood pressure (!) 156/102, pulse 82, temperature 98.3 F (36.8 C), temperature source Oral, resp. rate 18, SpO2 100 %.  Physical Exam  Constitutional: He is oriented to person, place, and time. He appears cachectic.  HENT:  Head: Normocephalic and atraumatic.  Mouth/Throat: Oropharynx is clear and moist. No oropharyngeal exudate.  Eyes: Conjunctivae are normal.  Cardiovascular: Normal rate, regular rhythm, normal heart sounds and intact distal pulses.  No murmur heard. Respiratory: He is in respiratory distress (use of accessory muscles ). He has wheezes (in bilateral upper lung fields, mildly decreased breaths sounds in lower lung fields ).  stridor  GI: Soft. Bowel sounds are normal. He exhibits no distension. There is no abdominal tenderness.  Musculoskeletal:  General: No tenderness or edema.  Neurological: He is alert and oriented to person, place, and time.  Skin: No rash noted. No erythema.  Psychiatric: He has a normal mood and affect. His behavior is normal. Judgment and thought content normal.   Labs: Na=132, K=3.1, Mg 1.8 Anion gap 18 WBC 13  EKG: personally reviewed my interpretation is sinus rhythm with lvh  CXR: personally reviewed my interpretation is barrel shaped chest, no significant infiltrate or effusion noted. Some chronic scarring in right upper lobe. Bullae on left lungs.   Assessment & Plan by Problem: Active Problems:   Acute respiratory distress  Mr. Margraf is  a 58 y.o male with tobacco use disorder who presents with dyspnea, productive cough, wheezing, and in respiratory distress. Treating for presumed copd exacerbation.   Acute respiratory distress  Patient with cardinal symptoms of copd, appears euvolemic on exam, and does not have any anemia. Therefore, will treat patient for presumed copd exacerbation. The patient does not have an official copd exacerbation. The exacerbation is likely due to a viral infection given that patient also has hoarseness and not being on any maintenance medications.  -Given Methylprednisone in ED, Prednisone 40mg  starting 7/2 -Azithromycin 500mg  day 1, then 250mg  day 2-5  -Duonebs q6hrs -Hydration, voice rest, and mucinex for hoarseness -PFTs should be done as an outpatient  -Will need to be started on maintenance medications on discharge -consulted care management to get patient a pcp  Possible lung nodule  Per chest xray 06/16/19 was read as with presence of small spiculated right upper lobe lung nodule that can still be visualized somewhat on 7/1 chest xray.   -Recommend follow up CT chest   Dispo: Admit patient to Observation with expected length of stay less than 2 midnights.  SignedLars Mage, MD 06/29/2019, 8:46 AM  Pager: [phone]

## 2019-06-29 NOTE — Progress Notes (Signed)
MD Ronnald Ramp paged regarding pt consistently elevated BP.  Call returned and no new order at this time. Advised to continue monitoring pt BP and symptom progression.  Will continue to monitor.

## 2019-06-29 NOTE — ED Provider Notes (Addendum)
[facility] EMERGENCY DEPARTMENT Provider Note   CSN: [id number] Arrival date & time: 06/29/19  0401   History   Chief Complaint Chief Complaint  Patient presents with  . Shortness of Breath    HPI Perry Waller is a 58 y.o. male.   The history is provided by the patient. The history is limited by the condition of the patient (Severe respiratory distress).  He had onset about 3 hours ago of severe difficulty breathing.  He has had subjective fever as well as chills and sweats and he has had a nonproductive cough.  He tried using his albuterol inhaler, but it did not give him any relief.  He denies any pain anywhere.  He denies any sick contacts and specifically denies exposure to COVID-19.  He is still smoking.  History reviewed. No pertinent past medical history.  There are no active problems to display for this patient.   No past surgical history on file.      Home Medications    Prior to Admission medications   Medication Sig Start Date End Date Taking? Authorizing Provider  benzonatate (TESSALON) 100 MG capsule Take 1 capsule by mouth every 8 (eight) hours for cough. 05/17/19   Vanessa Kick, MD  oxyCODONE-acetaminophen (PERCOCET) 7.5-325 MG per tablet Take 1 tablet by mouth every 8 (eight) hours as needed. For pain Patient not taking: Reported on 05/17/2019 11/06/11   Dorie Rank, MD  predniSONE (STERAPRED UNI-PAK 21 TAB) 10 MG (21) TBPK tablet Take by mouth daily. Take 6 tabs by mouth daily  for 2 days, then 5 tabs for 2 days, then 4 tabs for 2 days, then 3 tabs for 2 days, 2 tabs for 2 days, then 1 tab by mouth daily for 2 days 06/16/19   Lacretia Leigh, MD    Family History No family history on file.  Social History Social History   Tobacco Use  . Smoking status: Current Every Day Smoker  . Smokeless tobacco: Never Used  Substance Use Topics  . Alcohol use: No  . Drug use: No     Allergies   Patient has no known allergies.   Review of  Systems Review of Systems  Unable to perform ROS: Severe respiratory distress     Physical Exam Updated Vital Signs BP (!) 160/100   Pulse 87   Temp 98.3 F (36.8 C) (Oral)   Resp (!) 32   SpO2 100%   Physical Exam Vitals signs and nursing note reviewed.    Somewhat cachectic 58 year old male, in moderate respiratory distress. Vital signs are significant for elevated blood pressure. Oxygen saturation is 93%, which is normal. Head is normocephalic and atraumatic. PERRLA, EOMI. Oropharynx is clear. Neck is nontender and supple without adenopathy or JVD. Back is nontender and there is no CVA tenderness. Lungs: Some stridor is present as well as prolonged exhalation phase without overt rales, wheezes, or rhonchi. Chest is nontender.  Intercostal retractions are noted. Heart has regular rate and rhythm without murmur. Abdomen is soft, flat, nontender without masses or hepatosplenomegaly and peristalsis is normoactive. Extremities have no cyanosis or edema, full range of motion is present. Skin is warm and dry without rash. Neurologic: Mental status is normal, cranial nerves are intact, there are no motor or sensory deficits.  ED Treatments / Results  Labs (all labs ordered are listed, but only abnormal results are displayed) Labs Reviewed  BASIC METABOLIC PANEL - Abnormal; Notable for the following components:  Result Value   Sodium 132 (*)    Potassium 3.1 (*)    Chloride 92 (*)    Glucose, Bld 181 (*)    Calcium 8.8 (*)    Anion gap 18 (*)    All other components within normal limits  CBC WITH DIFFERENTIAL/PLATELET - Abnormal; Notable for the following components:   WBC 13.0 (*)    Lymphs Abs 8.0 (*)    Abs Immature Granulocytes 0.10 (*)    All other components within normal limits  SARS CORONAVIRUS 2 (HOSPITAL ORDER, [facility])  MAGNESIUM  PATHOLOGIST SMEAR REVIEW    EKG EKG Interpretation  Date/Time:  Wednesday June 29 2019  04:15:27 EDT Ventricular Rate:  101 PR Interval:    QRS Duration: 87 QT Interval:  389 QTC Calculation: 505 R Axis:   72 Text Interpretation:  Sinus tachycardia Probable left atrial enlargement Left ventricular hypertrophy Anterior infarct, old Prolonged QT interval No old tracing to compare Confirmed by Delora Fuel ([id number]) on 06/29/2019 4:22:41 AM   Radiology Dg Chest Port 1 View  Result Date: 06/29/2019 CLINICAL DATA:  Shortness of breath EXAM: PORTABLE CHEST 1 VIEW COMPARISON:  06/16/2019 FINDINGS: Normal heart size and mediastinal contours. 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 Chronic mild right upper lobe scarring. No acute infiltrate or edema. No effusion or pneumothorax. No acute osseous findings. IMPRESSION: No acute finding. Emphysema. Electronically Signed   By: Monte Fantasia M.D.   On: 06/29/2019 05:20    Procedures Procedures  CRITICAL CARE Performed by: Delora Fuel Total critical care time: 50 minutes Critical care time was exclusive of separately billable procedures and treating other patients. Critical care was necessary to treat or prevent imminent or life-threatening deterioration. Critical care was time spent personally by me on the following activities: development of treatment plan with patient and/or surrogate as well as nursing, discussions with consultants, evaluation of patient's response to treatment, examination of patient, obtaining history from patient or surrogate, ordering and performing treatments and interventions, ordering and review of laboratory studies, ordering and review of radiographic studies, pulse oximetry and re-evaluation of patient's condition.  Medications Ordered in ED Medications  magnesium sulfate IVPB 2 g 50 mL (0 g Intravenous Stopped 06/29/19 0518)  albuterol (VENTOLIN HFA) 108 (90 Base) MCG/ACT inhaler 8 puff (8 puffs Inhalation Given 06/29/19 0426)  methylPREDNISolone sodium succinate (SOLU-MEDROL) 125 mg/2 mL injection 125 mg (125  mg Intravenous Given 06/29/19 0433)  AeroChamber Plus Flo-Vu Large MISC (  Given 06/29/19 0426)  EPINEPHrine (EPI-PEN) injection 0.3 mg (0.3 mg Intramuscular Given 06/29/19 0515)  albuterol (VENTOLIN HFA) 108 (90 Base) MCG/ACT inhaler 8 puff (8 puffs Inhalation Given 06/29/19 0517)  potassium chloride SA (K-DUR) CR tablet 40 mEq (40 mEq Oral Given 06/29/19 0725)  ipratropium-albuterol (DUONEB) 0.5-2.5 (3) MG/3ML nebulizer solution 3 mL (3 mLs Nebulization Given 06/29/19 0724)     Initial Impression / Assessment and Plan / ED Course  I have reviewed the triage vital signs and the nursing notes.  Pertinent labs & imaging results that were available during my care of the patient were reviewed by me and considered in my medical decision making (see chart for details).  Acute respiratory distress with evidence of bronchospasm.  Old records are reviewed, and he was seen in the ED 2 weeks ago with cough and probable COPD.  Chest x-ray had shown a possible spiculated right upper lobe nodule.  He is given intravenous magnesium and methylprednisolone and is given albuterol via  inhaler.  Chest x-ray will be repeated.  ECG shows LVH and mildly prolonged QT interval.  Will check magnesium level even though he is already scheduled to get intravenous magnesium.  5:07 AM Resting a little bit more comfortably, but still using accessory muscles of respiration.  Airway issues seem to be more upper airway at this point.  Chest x-ray shows no obvious infiltrates.  We will give an injection of epinephrine and give additional albuterol via inhaler.  7:17 AM There is modest additional improvement with above-noted treatment.  He is resting but still using accessory muscles of respiration.  There is still mild upper airway stridor but now there is more lower airway wheezing.  COVID-19 test has come back negative.  He is given an albuterol with ipratropium nebulizer treatment.  He will need to be admitted.  Potassium is noted to be  low at 3.1, and he is given oral potassium.  Magnesium level has come back normal.  Case is discussed with resident on for internal medicine teaching service who agrees to admit the patient.  Final Clinical Impressions(s) / ED Diagnoses   Final diagnoses:  COPD exacerbation [facility])  Hypokalemia    ED Discharge Orders    None       Delora Fuel, MD 01/06/31 [id number]    Delora Fuel, MD 32/20/25 [phone]

## 2019-06-29 NOTE — ED Notes (Signed)
[name] Nicholson(SISTER)- [phone] [facility] UPDATES

## 2019-06-29 NOTE — ED Provider Notes (Signed)
Admit to hospitalist for COPD exacerbation. Had Epi, Steroid and multiple inhaler tx. Not somnolent but still increased WOB. Physical Exam  BP (!) 147/103   Pulse 86   Temp 98.3 F (36.8 C) (Oral)   Resp 16   SpO2 100%   Physical Exam  ED Course/Procedures     Procedures  MDM         Charlesetta Shanks, MD 06/29/19 0730

## 2019-06-30 ENCOUNTER — Encounter (HOSPITAL_COMMUNITY): Payer: Self-pay

## 2019-06-30 DIAGNOSIS — J9601 Acute respiratory failure with hypoxia: Secondary | ICD-10-CM

## 2019-06-30 DIAGNOSIS — R0603 Acute respiratory distress: Secondary | ICD-10-CM | POA: Diagnosis not present

## 2019-06-30 DIAGNOSIS — T17920A Food in respiratory tract, part unspecified causing asphyxiation, initial encounter: Secondary | ICD-10-CM | POA: Diagnosis not present

## 2019-06-30 DIAGNOSIS — Z720 Tobacco use: Secondary | ICD-10-CM

## 2019-06-30 DIAGNOSIS — R918 Other nonspecific abnormal finding of lung field: Secondary | ICD-10-CM | POA: Diagnosis not present

## 2019-06-30 DIAGNOSIS — J441 Chronic obstructive pulmonary disease with (acute) exacerbation: Secondary | ICD-10-CM

## 2019-06-30 DIAGNOSIS — J449 Chronic obstructive pulmonary disease, unspecified: Secondary | ICD-10-CM | POA: Diagnosis not present

## 2019-06-30 HISTORY — DX: Acute respiratory failure with hypoxia: J96.01

## 2019-06-30 LAB — CBC
HCT: 38.9 % — ABNORMAL LOW (ref 39.0–52.0)
Hemoglobin: 12.6 g/dL — ABNORMAL LOW (ref 13.0–17.0)
MCH: 30.7 pg (ref 26.0–34.0)
MCHC: 32.4 g/dL (ref 30.0–36.0)
MCV: 94.9 fL (ref 80.0–100.0)
Platelets: 253 10*3/uL (ref 150–400)
RBC: 4.1 MIL/uL — ABNORMAL LOW (ref 4.22–5.81)
RDW: 12 % (ref 11.5–15.5)
WBC: 18.8 10*3/uL — ABNORMAL HIGH (ref 4.0–10.5)
nRBC: 0 % (ref 0.0–0.2)

## 2019-06-30 LAB — BASIC METABOLIC PANEL
Anion gap: 10 (ref 5–15)
BUN: 10 mg/dL (ref 6–20)
CO2: 29 mmol/L (ref 22–32)
Calcium: 8.9 mg/dL (ref 8.9–10.3)
Chloride: 97 mmol/L — ABNORMAL LOW (ref 98–111)
Creatinine, Ser: 0.82 mg/dL (ref 0.61–1.24)
GFR calc Af Amer: >60 mL/min (ref >60)
GFR calc non Af Amer: >60 mL/min (ref >60)
Glucose, Bld: 124 mg/dL — ABNORMAL HIGH (ref 70–99)
Potassium: 4.6 mmol/L (ref 3.5–5.1)
Sodium: 136 mmol/L (ref 135–145)

## 2019-06-30 LAB — MAGNESIUM: Magnesium: 1.8 mg/dL (ref 1.7–2.4)

## 2019-06-30 LAB — HIV ANTIBODY (ROUTINE TESTING W REFLEX): HIV Screen 4th Generation wRfx: NONREACTIVE

## 2019-06-30 MED ORDER — SPIRIVA HANDIHALER 18 MCG IN CAPS: 18.0000 ug | ORAL_CAPSULE | Freq: Every day | RESPIRATORY_TRACT | 0 refills | Status: DC

## 2019-06-30 MED ORDER — PREDNISONE 20 MG PO TABS: 40.0000 mg | ORAL_TABLET | Freq: Every day | ORAL | 0 refills | Status: AC

## 2019-06-30 MED ORDER — AZITHROMYCIN 250 MG PO TABS: ORAL_TABLET | ORAL | 0 refills | Status: AC

## 2019-06-30 MED ORDER — ALBUTEROL SULFATE HFA 108 (90 BASE) MCG/ACT IN AERS: 2.0000 | INHALATION_SPRAY | RESPIRATORY_TRACT | 0 refills | Status: DC | PRN

## 2019-06-30 NOTE — Progress Notes (Signed)
  Subjective:  Patient states that he is feeling much better today and feels almost back to normal. His breathing has improved and he denies chest pain. Patient says that he can walk to the bathroom without getting short of breath.   UPDATE: Was called to patient room at approximately 1230 as patient had aspirated while eating corn flakes. Per RN, patient was given heimlich which aided in him coughing up some of what he aspirated. Of note, per staff, patient was still coughing at time heimlich was rendered. At time I arrived at room, patient was breathing comfortably and stated he did not have any increased difficulty breathing following the aspiration. Patient O2 sat at 94% while on 2L [place], unchanged from before aspiration episode. Nursing staff did not observe any desaturation. Patient denied any history of aspiration/difficulty eating/drinking. Patient was advised to come to ED or call physician if he developed fever following discharge as this could be a sign of pneumonia. Patient's sister was also called and informed of event and instructed to watch for fever.  Objective:    Vital Signs (last 24 hours): Vitals:   06/30/19 0240 06/30/19 0737 06/30/19 0820 06/30/19 1129  BP:   120/89 (!) 180/134  Pulse:  (!) 108 (!) 109 (!) 127  Resp:  20 19   Temp:   99 F (37.2 C)   TempSrc:   Oral   SpO2: 97% 98% 100% 94%  Weight:      Height:        Physical Exam: General Alert and oriented, no acute distress  Cardiac Regular rate and rhythm, no murmurs, rubs, or gallops  Pulmonary Clear to auscultation bilaterally without wheezes, rhonchi, or rales  Extremities No Peripheral edema        Assessment/Plan:   Principal Problem:   Acute respiratory failure with hypoxia (HCC) Active Problems:   COPD exacerbation (HCC)  Perry Waller is a 58 year old male with tobacco use disorder who presents with dyspnea, productive cough, wheezing, and respiratory distress. Treating for presumed COPD  exacerbation. Patient much improved this morning and is breathing comfortably and saturating well on 2 L [place]. Plan for discharge today.  # Acute Respiratory Distress likely representing COPD exacerbation: * Given methylprednisone x1 and started on 5 day course of prednisone 40 mg * Azithromycin 500 mg day 1, then 250 mg day 2-5 (started on 06/29/19) * Duonebs Q6HR PRN * Mucinex for hourseness * PFTs as outpatient. * Plan for discharge today  # Possible lung nodules * CXR on 6/18 ead as presence of small spiculated right upper lobe lung nodule - may still be appreciable on 7/1 CXR * Recommend followup CT as outpatient  Dispo: Anticipated discharge today  Jeanmarie Hubert, MD 06/30/2019, 1:37 PM Pager: [phone]

## 2019-06-30 NOTE — Progress Notes (Signed)
SATURATION QUALIFICATIONS: (This note is used to comply with regulatory documentation for home oxygen)  Patient Saturations on Room Air at Rest = 100%  Patient Saturations on Room Air while Ambulating = 96%  Patient Saturations on  Liters of oxygen while Ambulating = %  Please briefly explain why patient needs home oxygen:

## 2019-06-30 NOTE — Progress Notes (Signed)
Student Pharmacist rounding with the [employer] service. Asked by IMTS to provide patient education and technique for use of his Spiriva Handihaler.  Patient is admitted for a mild chronic obstructive pulmonary disease exacerbation who is planning to be discharged on Spiriva. He is managed inpatient on azithromycin 250mg  by mouth once daily for five days and prednisone 40mg  by mouth once daily for five days. Patient is a 58 year old male with a new onset chronic obstructive pulmonary disease exacerbation.  Team is planning to discharge Perry Waller later today with a new prescription for Spiriva. Patient received education on the proper usage of the Spiriva Handihaler device at bedside. Patient was provided a printout of instructions and states he and his sister will review inhaler technique once discharged. Continue to monitor for worsening pulmonary function and assess technique in the outpatient setting.

## 2019-07-02 NOTE — Discharge Summary (Signed)
Name: Perry Waller MRN: [id number] DOB: October 21, 1961 58 y.o. PCP: Patient, No Pcp Per  Date of Admission: 06/29/2019  4:01 AM Date of Discharge: 06/30/2019 Attending Physician: No att. providers found  Discharge Diagnosis: 1. Acute Respiratory Distress likely representing a COPD exacerbation  Discharge Medications: Allergies as of 06/30/2019   No Known Allergies     Medication List    STOP taking these medications   benzonatate 100 MG capsule Commonly known as: TESSALON   naproxen sodium 220 MG tablet Commonly known as: ALEVE   oxyCODONE-acetaminophen 7.5-325 MG tablet Commonly known as: PERCOCET     TAKE these medications   albuterol 108 (90 Base) MCG/ACT inhaler Commonly known as: VENTOLIN HFA Inhale 2 puffs into the lungs every 4 (four) hours as needed for wheezing or shortness of breath.   azithromycin 250 MG tablet Commonly known as: ZITHROMAX Take one tablet every day for the next three days   predniSONE 20 MG tablet Commonly known as: DELTASONE Take 2 tablets (40 mg total) by mouth daily with breakfast for 3 days. What changed:   medication strength  how much to take  when to take this  additional instructions  Another medication with the same name was removed. Continue taking this medication, and follow the directions you see here.   Spiriva HandiHaler 18 MCG inhalation capsule Generic drug: tiotropium Place 1 capsule (18 mcg total) into inhaler and inhale daily.       Disposition and follow-up:   Mr.Perry Waller was discharged from [facility] in Stable condition.  At the hospital follow up visit please address:  1.  PFTs needed to diagnose presumed COPD. Patient may need escalation of his pulmonary medications.  2.  Labs / imaging needed at time of follow-up: CT should be considered to assess potential right upper lobe lung nodule that was appreciated on the 06/16/19 CXR  3.  Pending labs/tests needing follow-up: None  Follow-up  Appointments: Follow-up [place] Follow up on 07/13/2019.   Specialty: Internal Medicine Why: 9:20 for hospital follow up Contact information: [place] [id number] [place] [facility] Course by problem list: 1. Acute Respiratory Distress: [name] presented with severe dyspnea that was accompanied by chills, sweats, and productive cough. Patient had similar episode on 6/18 and was treated in the ED as a mild COPD exacerbation. Patient has a 30 pack year history of smoking and has never had PFTs performed. On exam, patient had accessory muscle usage and wheezes in bilateral upper lung fields. Patient was provided standard COPD treatment and treated with prednisone, azithromycin, and duonebs. By the next day, the patient was significantly improved and was near baseline according to the patient. Oxygen saturation test performed and patient had saturations of 100% on room air and 96% while ambulating. Patient was discharged to home on albuterol, spirivia, prednisone, and azithromycin.  2. Possible lung nodule: CXR from 06/16/19 showed a possible small spiculated right upper lung nodule. Radiology recommended that followup CT should be considered.  Discharge Vitals:   BP (!) 180/134   Pulse (!) 120   Temp 99 F (37.2 C) (Oral)   Resp 20   Ht 5\' 11"  (1.803 m)   Wt 58.1 kg   SpO2 95%   BMI 17.85 kg/m   Pertinent Labs, Studies, and Procedures:   CXR Portable 1 View (06/29/2019): FINDINGS: Normal heart size and  mediastinal contours. 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 Chronic mild right upper lobe scarring. No acute infiltrate or edema. No effusion or pneumothorax. No acute osseous findings.  IMPRESSION: No acute finding.  Emphysema.  CXR 2 Views (06/16/19): FINDINGS: Pulmonary hyperinflation. No pleural effusion. Normal cardiomediastinal silhouette. Linear scarring in the right  upper lobe. Bullous emphysematous disease. Possible small spiculated nodule in the right upper lobe.  IMPRESSION: 1. Hyperinflation with emphysematous disease. No acute pulmonary infiltrate 2. Possible small spiculated right upper lobe lung nodule. CT chest could be obtained to further evaluate.   Discharge Instructions: Discharge Instructions    Call MD for:  difficulty breathing, headache or visual disturbances   Complete by: As directed    Call MD for:  difficulty breathing, headache or visual disturbances   Complete by: As directed    Call MD for:  temperature >100.4   Complete by: As directed    Call MD for:  temperature >100.4   Complete by: As directed    Diet - low sodium heart healthy   Complete by: As directed    Diet - low sodium heart healthy   Complete by: As directed    Discharge instructions   Complete by: As directed    You were treated at [facility] for a COPD exacerbation. COPD is a disease of your lungs that is most likely related to smoking. In addition to   Discharge instructions   Complete by: As directed    During your hospitalization, you were treated for a COPD exacerbation. COPD is a disease of your lungs that can be caused by smoking. In addition to quitting smoking, it is important that you take your medications as prescribed and followup with your primary care provider to help manage your medications and any issues that you might be having.   Increase activity slowly   Complete by: As directed    Increase activity slowly   Complete by: As directed       Signed: Jeanmarie Hubert, MD 07/02/2019, 3:18 PM   Pager: [phone]

## 2019-07-04 ENCOUNTER — Telehealth: Payer: Self-pay | Admitting: Internal Medicine

## 2019-07-13 ENCOUNTER — Ambulatory Visit: Payer: Medicaid Other | Admitting: Family Medicine

## 2019-07-13 ENCOUNTER — Encounter: Payer: Self-pay | Admitting: Family Medicine

## 2019-07-13 ENCOUNTER — Other Ambulatory Visit: Payer: Self-pay

## 2019-07-13 VITALS — BP 119/78 | HR 98 | Temp 97.7°F | Resp 24 | Ht 71.0 in | Wt 103.0 lb

## 2019-07-13 DIAGNOSIS — J449 Chronic obstructive pulmonary disease, unspecified: Secondary | ICD-10-CM | POA: Diagnosis not present

## 2019-07-13 DIAGNOSIS — Z7689 Persons encountering health services in other specified circumstances: Secondary | ICD-10-CM | POA: Diagnosis not present

## 2019-07-13 DIAGNOSIS — R911 Solitary pulmonary nodule: Secondary | ICD-10-CM

## 2019-07-13 DIAGNOSIS — R636 Underweight: Secondary | ICD-10-CM

## 2019-07-13 DIAGNOSIS — Z1211 Encounter for screening for malignant neoplasm of colon: Secondary | ICD-10-CM | POA: Diagnosis not present

## 2019-07-13 DIAGNOSIS — Z1159 Encounter for screening for other viral diseases: Secondary | ICD-10-CM

## 2019-07-13 MED ORDER — AMOXICILLIN-POT CLAVULANATE 875-125 MG PO TABS: 1.0000 | ORAL_TABLET | Freq: Two times a day (BID) | ORAL | 0 refills | Status: AC

## 2019-07-13 MED ORDER — SPIRIVA HANDIHALER 18 MCG IN CAPS: 18.0000 ug | ORAL_CAPSULE | Freq: Every day | RESPIRATORY_TRACT | 2 refills | Status: DC

## 2019-07-13 MED ORDER — PREDNISONE 10 MG (21) PO TBPK: ORAL_TABLET | ORAL | 0 refills | Status: DC

## 2019-07-13 MED ORDER — ALBUTEROL SULFATE HFA 108 (90 BASE) MCG/ACT IN AERS: 2.0000 | INHALATION_SPRAY | RESPIRATORY_TRACT | 2 refills | Status: DC | PRN

## 2019-07-13 NOTE — Progress Notes (Signed)
Patient [facility]  New Patient Encounter Provider: [name], [place]  XQJ:[id number]  DOB - 1961-05-17  SUBJECTIVE:   Perry Waller, is a 58 y.o. male who presents to establish care with this clinic.   Current problems/concerns:  Patient recently relocated to the area from [place]. He has been to the ED 3 times in the past 3 months for COPD exacerbations. Patient reports taking albuterol and spiriva regularly and ran out of albuterol in the past few days. He states that he is having coughing that is worsened when he lays down. He denies acid reflux or heartburn. He reports occassionally drinking and smoking 4 cigs per day. He has smoked for over 40 years.   No Known Allergies Past Medical History:  Diagnosis Date  . Acute respiratory acidosis 06/29/2019  . Arthritis    Current Outpatient Medications on File Prior to Visit  Medication Sig Dispense Refill  . albuterol (VENTOLIN HFA) 108 (90 Base) MCG/ACT inhaler Inhale 2 puffs into the lungs every 4 (four) hours as needed for wheezing or shortness of breath. 18 g 0  . tiotropium (SPIRIVA HANDIHALER) 18 MCG inhalation capsule Place 1 capsule (18 mcg total) into inhaler and inhale daily. 30 capsule 0   No current facility-administered medications on file prior to visit.    History reviewed. No pertinent family history. Social History   Socioeconomic History  . Marital status: Single    Spouse name: Not on file  . Number of children: Not on file  . Years of education: Not on file  . Highest education level: Not on file  Occupational History  . Not on file  Social Needs  . Financial resource strain: Not on file  . Food insecurity    Worry: Not on file    Inability: Not on file  . Transportation needs    Medical: Not on file    Non-medical: Not on file  Tobacco Use  . Smoking status: Current Every Day Smoker    Packs/day: 0.50    Years: 45.00    Pack years:  22.50    Types: Cigarettes  . Smokeless tobacco: Never Used  Substance and Sexual Activity  . Alcohol use: Yes    Comment: occ  . Drug use: No  . Sexual activity: Not on file  Lifestyle  . Physical activity    Days per week: Not on file    Minutes per session: Not on file  . Stress: Not on file  Relationships  . Social Herbalist on phone: Not on file    Gets together: Not on file    Attends religious service: Not on file    Active member of club or organization: Not on file    Attends meetings of clubs or organizations: Not on file    Relationship status: Not on file  . Intimate partner violence    Fear of current or ex partner: Not on file    Emotionally abused: Not on file    Physically abused: Not on file    Forced sexual activity: Not on file  Other Topics Concern  . Not on file  Social History Narrative   Patient is new to [facility], he moved from [place] two weeks ago.     Review of Systems  Constitutional: Negative.   HENT: Negative.   Eyes: Negative.   Respiratory: Positive for cough, sputum production, shortness of breath and wheezing.  Cardiovascular: Negative.   Gastrointestinal: Negative.   Genitourinary: Negative.   Musculoskeletal: Negative.   Skin: Negative.   Neurological: Negative.   Psychiatric/Behavioral: Negative.      OBJECTIVE:    BP 119/78 (BP Location: Left Arm, Patient Position: Sitting, Cuff Size: Normal)   Pulse 98   Temp 97.7 F (36.5 C) (Oral)   Resp (!) 24   Ht 5\' 11"  (1.803 m)   Wt 103 lb (46.7 kg)   SpO2 100%   BMI 14.37 kg/m   Physical Exam  Constitutional: He is oriented to person, place, and time and well-developed, well-nourished, and in no distress. No distress.  HENT:  Head: Normocephalic and atraumatic.  Mouth/Throat: Uvula is midline. Abnormal dentition. Dental caries present.  Eyes: Pupils are equal, round, and reactive to light. Conjunctivae and EOM are normal.  Neck: Normal range of motion. Neck  supple.  Cardiovascular: Normal rate, regular rhythm and intact distal pulses. Exam reveals no gallop and no friction rub.  No murmur heard. Pulmonary/Chest: Effort normal. No respiratory distress. He has wheezes.  Abdominal: Soft. Bowel sounds are normal. There is no abdominal tenderness.  Musculoskeletal: Normal range of motion.        General: No tenderness or edema.  Lymphadenopathy:    He has no cervical adenopathy.  Neurological: He is alert and oriented to person, place, and time. Gait normal.  Skin: Skin is warm and dry.  Psychiatric: Mood, memory, affect and judgment normal.  Nursing note and vitals reviewed.    ASSESSMENT/PLAN:  1. Chronic obstructive pulmonary disease, unspecified COPD type ([place]) Wheezed noted throughout all lung fields. Encouraged smoking cessation. Referral to pulm. Started on prednisone, augmentin. Refilled meds.  - Ambulatory referral to Pulmonology - albuterol (VENTOLIN HFA) 108 (90 Base) MCG/ACT inhaler; Inhale 2 puffs into the lungs every 4 (four) hours as needed for wheezing or shortness of breath.  Dispense: 18 g; Refill: 2 - tiotropium (SPIRIVA HANDIHALER) 18 MCG inhalation capsule; Place 1 capsule (18 mcg total) into inhaler and inhale daily.  Dispense: 30 capsule; Refill: 2 - amoxicillin-clavulanate (AUGMENTIN) 875-125 MG tablet; Take 1 tablet by mouth every 12 (twelve) hours for 7 days.  Dispense: 14 tablet; Refill: 0 - predniSONE (STERAPRED UNI-PAK 21 TAB) 10 MG (21) TBPK tablet; Take as directed on pack  Dispense: 21 tablet; Refill: 0 - B12 and Folate Panel  2. Right upper lobe pulmonary nodule Referral to pulm for further evaluation and treatment.   3. Screen for colon cancer Discussed with patient and brochure given.  - Cologuard - [facility] COLONOSCOPY  4. Establishing care with new doctor, encounter for Abnormal CBC at ED. Will repeat today.  - CBC with Differential - Comprehensive metabolic panel - Lipid Panel  5. Low weight Encourage  small frequent meals.  - B12 and Folate Panel - TSH - Vitamin D, 25-hydroxy  6. Need for hepatitis C screening test Ordered per Health Maintenance - Hepatitis c antibody (reflex)   Return in about 3 months (around 10/13/2019).  The patient was given clear instructions to go to ER or return to medical center if symptoms don't improve, worsen or new problems develop. The patient verbalized understanding. The patient was told to call to get lab results if they haven't heard anything in the next week.     This note has been created with Surveyor, quantity. Any transcriptional errors are unintentional.   Ms. Andr L. Nathaneil Canary, FNP-BC Patient [place]  [address]  [place], [place] [address] [phone]

## 2019-07-13 NOTE — Patient Instructions (Addendum)
It was a pleasure meeting you. I sent your refills to the [facility] at [place]. You will receive a package in the mail to do the colon screening. Follow the directions for that.  I also referred you to a specialist for your lungs (Pulmonologist).  I will see you in 6 weeks. If you need anything sooner than that, please give [place] a call.    Chronic Obstructive Pulmonary Disease Chronic obstructive pulmonary disease (COPD) is a long-term (chronic) lung problem. When you have COPD, it is hard for air to get in and out of your lungs. Usually the condition gets worse over time, and your lungs will never return to normal. There are things you can do to keep yourself as healthy as possible.  Your doctor may treat your condition with: ? Medicines. ? Oxygen. ? Lung surgery.  Your doctor may also recommend: ? Rehabilitation. This includes steps to make your body work better. It may involve a team of specialists. ? Quitting smoking, if you smoke. ? Exercise and changes to your diet. ? Comfort measures (palliative care). Follow these instructions at home: Medicines  Take over-the-counter and prescription medicines only as told by your doctor.  Talk to your doctor before taking any cough or allergy medicines. You may need to avoid medicines that cause your lungs to be dry. Lifestyle  If you smoke, stop. Smoking makes the problem worse. If you need help quitting, ask your doctor.  Avoid being around things that make your breathing worse. This may include smoke, chemicals, and fumes.  Stay active, but remember to rest as well.  Learn and use tips on how to relax.  Make sure you get enough sleep. Most adults need at least 7 hours of sleep every night.  Eat healthy foods. Eat smaller meals more often. Rest before meals. Controlled breathing Learn and use tips on how to control your breathing as told by your doctor. Try:  Breathing in (inhaling) through your nose for 1 second. Then, pucker  your lips and breath out (exhale) through your lips for 2 seconds.  Putting one hand on your belly (abdomen). Breathe in slowly through your nose for 1 second. Your hand on your belly should move out. Pucker your lips and breathe out slowly through your lips. Your hand on your belly should move in as you breathe out.  Controlled coughing Learn and use controlled coughing to clear mucus from your lungs. Follow these steps: 1. Lean your head a little forward. 2. Breathe in deeply. 3. Try to hold your breath for 3 seconds. 4. Keep your mouth slightly open while coughing 2 times. 5. Spit any mucus out into a tissue. 6. Rest and do the steps again 1 or 2 times as needed. General instructions  Make sure you get all the shots (vaccines) that your doctor recommends. Ask your doctor about a flu shot and a pneumonia shot.  Use oxygen therapy and pulmonary rehabilitation if told by your doctor. If you need home oxygen therapy, ask your doctor if you should buy a tool to measure your oxygen level (oximeter).  Make a COPD action plan with your doctor. This helps you to know what to do if you feel worse than usual.  Manage any other conditions you have as told by your doctor.  Avoid going outside when it is very hot, cold, or humid.  Avoid people who have a sickness you can catch (contagious).  Keep all follow-up visits as told by your doctor. This is  important. Contact a doctor if:  You cough up more mucus than usual.  There is a change in the color or thickness of the mucus.  It is harder to breathe than usual.  Your breathing is faster than usual.  You have trouble sleeping.  You need to use your medicines more often than usual.  You have trouble doing your normal activities such as getting dressed or walking around the house. Get help right away if:  You have shortness of breath while resting.  You have shortness of breath that stops you from: ? Being able to talk. ? Doing  normal activities.  Your chest hurts for longer than 5 minutes.  Your skin color is more blue than usual.  Your pulse oximeter shows that you have low oxygen for longer than 5 minutes.  You have a fever.  You feel too tired to breathe normally. Summary  Chronic obstructive pulmonary disease (COPD) is a long-term lung problem.  The way your lungs work will never return to normal. Usually the condition gets worse over time. There are things you can do to keep yourself as healthy as possible.  Take over-the-counter and prescription medicines only as told by your doctor.  If you smoke, stop. Smoking makes the problem worse. This information is not intended to replace advice given to you by your health care provider. Make sure you discuss any questions you have with your health care provider. Document Released: 06/02/2008 Document Revised: 11/27/2017 Document Reviewed: 01/19/2017 Elsevier Patient Education  2020 [employer]. Steps to Quit Smoking Smoking tobacco is the leading cause of preventable death. It can affect almost every organ in the body. Smoking puts you and people around you at risk for many serious, long-lasting (chronic) diseases. Quitting smoking can be hard, but it is one of the best things that you can do for your health. It is never too late to quit. How do I get ready to quit? When you decide to quit smoking, make a plan to help you succeed. Before you quit:  Pick a date to quit. Set a date within the next 2 weeks to give you time to prepare.  Write down the reasons why you are quitting. Keep this list in places where you will see it often.  Tell your family, friends, and co-workers that you are quitting. Their support is important.  Talk with your doctor about the choices that may help you quit.  Find out if your health insurance will pay for these treatments.  Know the people, places, things, and activities that make you want to smoke (triggers). Avoid them.  What first steps can I take to quit smoking?  Throw away all cigarettes at home, at work, and in your car.  Throw away the things that you use when you smoke, such as ashtrays and lighters.  Clean your car. Make sure to empty the ashtray.  Clean your home, including curtains and carpets. What can I do to help me quit smoking? Talk with your doctor about taking medicines and seeing a counselor at the same time. You are more likely to succeed when you do both.  If you are pregnant or breastfeeding, talk with your doctor about counseling or other ways to quit smoking. Do not take medicine to help you quit smoking unless your doctor tells you to do so. To quit smoking: Quit right away  Quit smoking totally, instead of slowly cutting back on how much you smoke over a period of time.  Go to counseling. You are more likely to quit if you go to counseling sessions regularly. Take medicine You may take medicines to help you quit. Some medicines need a prescription, and some you can buy over-the-counter. Some medicines may contain a drug called nicotine to replace the nicotine in cigarettes. Medicines may:  Help you to stop having the desire to smoke (cravings).  Help to stop the problems that come when you stop smoking (withdrawal symptoms). Your doctor may ask you to use:  Nicotine patches, gum, or lozenges.  Nicotine inhalers or sprays.  Non-nicotine medicine that is taken by mouth. Find resources Find resources and other ways to help you quit smoking and remain smoke-free after you quit. These resources are most helpful when you use them often. They include:  Online chats with a Social worker.  Phone quitlines.  Printed Furniture conservator/restorer.  Support groups or group counseling.  Text messaging programs.  Mobile phone apps. Use apps on your mobile phone or tablet that can help you stick to your quit plan. There are many free apps for mobile phones and tablets as well as websites.  Examples include Quit Guide from the [employer] and smokefree.gov  What things can I do to make it easier to quit?   Talk to your family and friends. Ask them to support and encourage you.  Call a phone quitline (1-800-QUIT-NOW), reach out to support groups, or work with a Social worker.  Ask people who smoke to not smoke around you.  Avoid places that make you want to smoke, such as: ? Bars. ? Parties. ? Smoke-break areas at work.  Spend time with people who do not smoke.  Lower the stress in your life. Stress can make you want to smoke. Try these things to help your stress: ? Getting regular exercise. ? Doing deep-breathing exercises. ? Doing yoga. ? Meditating. ? Doing a body scan. To do this, close your eyes, focus on one area of your body at a time from head to toe. Notice which parts of your body are tense. Try to relax the muscles in those areas. How will I feel when I quit smoking? Day 1 to 3 weeks Within the first 24 hours, you may start to have some problems that come from quitting tobacco. These problems are very bad 2-3 days after you quit, but they do not often last for more than 2-3 weeks. You may get these symptoms:  Mood swings.  Feeling restless, nervous, angry, or annoyed.  Trouble concentrating.  Dizziness.  Strong desire for high-sugar foods and nicotine.  Weight gain.  Trouble pooping (constipation).  Feeling like you may vomit (nausea).  Coughing or a sore throat.  Changes in how the medicines that you take for other issues work in your body.  Depression.  Trouble sleeping (insomnia). Week 3 and afterward After the first 2-3 weeks of quitting, you may start to notice more positive results, such as:  Better sense of smell and taste.  Less coughing and sore throat.  Slower heart rate.  Lower blood pressure.  Clearer skin.  Better breathing.  Fewer sick days. Quitting smoking can be hard. Do not give up if you fail the first time. Some people  need to try a few times before they succeed. Do your best to stick to your quit plan, and talk with your doctor if you have any questions or concerns. Summary  Smoking tobacco is the leading cause of preventable death. Quitting smoking can be hard, but it is one  of the best things that you can do for your health.  When you decide to quit smoking, make a plan to help you succeed.  Quit smoking right away, not slowly over a period of time.  When you start quitting, seek help from your doctor, family, or friends. This information is not intended to replace advice given to you by your health care provider. Make sure you discuss any questions you have with your health care provider. Document Released: 10/11/2009 Document Revised: 03/04/2019 Document Reviewed: 03/05/2019 Elsevier Patient Education  2020 [employer].

## 2019-07-14 ENCOUNTER — Other Ambulatory Visit: Payer: Self-pay | Admitting: Family Medicine

## 2019-07-14 DIAGNOSIS — J42 Unspecified chronic bronchitis: Secondary | ICD-10-CM

## 2019-07-14 LAB — B12 AND FOLATE PANEL
Folate: 5.2 ng/mL (ref >3.0)
Vitamin B-12: 390 pg/mL (ref 232–1245)

## 2019-07-14 LAB — COMPREHENSIVE METABOLIC PANEL
ALT: 10 IU/L (ref 0–44)
AST: 15 IU/L (ref 0–40)
Albumin/Globulin Ratio: 1.3 (ref 1.2–2.2)
Albumin: 4.1 g/dL (ref 3.8–4.9)
Alkaline Phosphatase: 62 IU/L (ref 39–117)
BUN/Creatinine Ratio: 12 (ref 9–20)
BUN: 10 mg/dL (ref 6–24)
Bilirubin Total: 0.6 mg/dL (ref 0.0–1.2)
CO2: 23 mmol/L (ref 20–29)
Calcium: 9.5 mg/dL (ref 8.7–10.2)
Chloride: 99 mmol/L (ref 96–106)
Creatinine, Ser: 0.86 mg/dL (ref 0.76–1.27)
GFR calc Af Amer: 110 mL/min/{1.73_m2} (ref >59)
GFR calc non Af Amer: 96 mL/min/{1.73_m2} (ref >59)
Globulin, Total: 3.2 g/dL (ref 1.5–4.5)
Glucose: 91 mg/dL (ref 65–99)
Potassium: 3.8 mmol/L (ref 3.5–5.2)
Sodium: 141 mmol/L (ref 134–144)
Total Protein: 7.3 g/dL (ref 6.0–8.5)

## 2019-07-14 LAB — CBC WITH DIFFERENTIAL/PLATELET
Basophils Absolute: 0 10*3/uL (ref 0.0–0.2)
Basos: 1 %
EOS (ABSOLUTE): 0.1 10*3/uL (ref 0.0–0.4)
Eos: 2 %
Hematocrit: 40.2 % (ref 37.5–51.0)
Hemoglobin: 13.3 g/dL (ref 13.0–17.7)
Immature Grans (Abs): 0 10*3/uL (ref 0.0–0.1)
Immature Granulocytes: 0 %
Lymphocytes Absolute: 2.2 10*3/uL (ref 0.7–3.1)
Lymphs: 52 %
MCH: 30.4 pg (ref 26.6–33.0)
MCHC: 33.1 g/dL (ref 31.5–35.7)
MCV: 92 fL (ref 79–97)
Monocytes Absolute: 0.3 10*3/uL (ref 0.1–0.9)
Monocytes: 7 %
Neutrophils Absolute: 1.6 10*3/uL (ref 1.4–7.0)
Neutrophils: 38 %
Platelets: 274 10*3/uL (ref 150–450)
RBC: 4.37 x10E6/uL (ref 4.14–5.80)
RDW: 11.2 % — ABNORMAL LOW (ref 11.6–15.4)
WBC: 4.1 10*3/uL (ref 3.4–10.8)

## 2019-07-14 LAB — LIPID PANEL
Chol/HDL Ratio: 3.5 ratio (ref 0.0–5.0)
Cholesterol, Total: 203 mg/dL — ABNORMAL HIGH (ref 100–199)
HDL: 58 mg/dL (ref >39)
LDL Calculated: 120 mg/dL — ABNORMAL HIGH (ref 0–99)
Triglycerides: 123 mg/dL (ref 0–149)
VLDL Cholesterol Cal: 25 mg/dL (ref 5–40)

## 2019-07-14 LAB — TSH: TSH: 1.09 u[IU]/mL (ref 0.450–4.500)

## 2019-07-14 LAB — HCV COMMENT:

## 2019-07-14 LAB — HEPATITIS C ANTIBODY (REFLEX): HCV Ab: <0.1 s/co ratio (ref 0.0–0.9)

## 2019-07-14 LAB — VITAMIN D 25 HYDROXY (VIT D DEFICIENCY, FRACTURES): Vit D, 25-Hydroxy: 20.3 ng/mL — ABNORMAL LOW (ref 30.0–100.0)

## 2019-07-25 ENCOUNTER — Other Ambulatory Visit: Payer: Self-pay | Admitting: Family Medicine

## 2019-07-25 MED ORDER — VITAMIN D (ERGOCALCIFEROL) 1.25 MG (50000 UNIT) PO CAPS: 50000.0000 [IU] | ORAL_CAPSULE | ORAL | 0 refills | Status: DC

## 2019-07-25 NOTE — Progress Notes (Signed)
Low Vitamin D. All other labs are stable. I will send Vit D to the pharmacy. After completion of the prescription, patient will need otc vitamin D of 1,000units daily.

## 2019-07-25 NOTE — Progress Notes (Unsigned)
Low Vitamin D. All other labs are stable. I will send Vit D to the pharmacy. After completion of the prescription, patient will need otc vitamin D of 1,000units daily.

## 2019-07-26 ENCOUNTER — Telehealth: Payer: Self-pay

## 2019-07-26 DIAGNOSIS — J449 Chronic obstructive pulmonary disease, unspecified: Secondary | ICD-10-CM

## 2019-07-26 MED ORDER — ALBUTEROL SULFATE HFA 108 (90 BASE) MCG/ACT IN AERS: 2.0000 | INHALATION_SPRAY | RESPIRATORY_TRACT | 2 refills | Status: DC | PRN

## 2019-07-26 NOTE — Telephone Encounter (Signed)
Patient's caregiver is concerned because patient hasn't been able to give a stool sample for cologard because he hasn't had a true bowel movement in a week. She also states he is not eating well. She is giving him ensure. She was advised to come in for these issues and appointment was moved up to 08/05/2019. Please advise any recommendations before coming in for appointment. Thanks !

## 2019-07-26 NOTE — Telephone Encounter (Signed)
Refill for albuterol sent into pharmacy. Thanks!

## 2019-07-26 NOTE — Telephone Encounter (Signed)
Called and spoke with patient's daughter regarding vitamin D being low and to start vitamin D once weekly as directed until completed. Asked that after completion he take otc vitamin d 1000 units daily. Caregiver verbalized understanding. Thanks!

## 2019-07-27 NOTE — Telephone Encounter (Signed)
Patient may not have large bowel movements if he is not eating. Continue with hydration.

## 2019-07-27 NOTE — Telephone Encounter (Signed)
Called and spoke with patient's care giver (sister) advised that he needs to continue to hydrate and he may not have large bowel movements due to not eating well. Asked that he keep next appointment on 08/05/2019. Thanks!

## 2019-08-05 ENCOUNTER — Ambulatory Visit (HOSPITAL_COMMUNITY)
Admission: RE | Admit: 2019-08-05 | Discharge: 2019-08-05 | Disposition: A | Discharge status: Home / Self Care | Payer: Medicaid Other | Source: Ambulatory Visit | Attending: Family Medicine | Admitting: Family Medicine

## 2019-08-05 ENCOUNTER — Ambulatory Visit (INDEPENDENT_AMBULATORY_CARE_PROVIDER_SITE_OTHER): Payer: Medicaid Other | Admitting: Family Medicine

## 2019-08-05 ENCOUNTER — Other Ambulatory Visit: Payer: Self-pay

## 2019-08-05 ENCOUNTER — Encounter (HOSPITAL_COMMUNITY): Payer: Self-pay | Admitting: Emergency Medicine

## 2019-08-05 ENCOUNTER — Encounter: Payer: Self-pay | Admitting: Family Medicine

## 2019-08-05 ENCOUNTER — Inpatient Hospital Stay (HOSPITAL_COMMUNITY)
Admission: EM | Admit: 2019-08-05 | Discharge: 2019-08-17 | DRG: 004 | Disposition: A | Discharge status: Home Health | Payer: Medicaid Other | Attending: Internal Medicine | Admitting: Internal Medicine

## 2019-08-05 ENCOUNTER — Emergency Department (HOSPITAL_COMMUNITY): Payer: Medicaid Other

## 2019-08-05 VITALS — BP 98/64 | HR 100 | Temp 97.8°F | Resp 20 | Ht 71.0 in | Wt 100.0 lb

## 2019-08-05 DIAGNOSIS — Z720 Tobacco use: Secondary | ICD-10-CM | POA: Diagnosis not present

## 2019-08-05 DIAGNOSIS — Z515 Encounter for palliative care: Secondary | ICD-10-CM

## 2019-08-05 DIAGNOSIS — J441 Chronic obstructive pulmonary disease with (acute) exacerbation: Secondary | ICD-10-CM

## 2019-08-05 DIAGNOSIS — J42 Unspecified chronic bronchitis: Secondary | ICD-10-CM | POA: Diagnosis not present

## 2019-08-05 DIAGNOSIS — T380X5A Adverse effect of glucocorticoids and synthetic analogues, initial encounter: Secondary | ICD-10-CM | POA: Diagnosis not present

## 2019-08-05 DIAGNOSIS — D638 Anemia in other chronic diseases classified elsewhere: Secondary | ICD-10-CM | POA: Diagnosis present

## 2019-08-05 DIAGNOSIS — Z681 Body mass index (BMI) 19 or less, adult: Secondary | ICD-10-CM | POA: Diagnosis not present

## 2019-08-05 DIAGNOSIS — Z7189 Other specified counseling: Secondary | ICD-10-CM

## 2019-08-05 DIAGNOSIS — R739 Hyperglycemia, unspecified: Secondary | ICD-10-CM | POA: Diagnosis not present

## 2019-08-05 DIAGNOSIS — M129 Arthropathy, unspecified: Secondary | ICD-10-CM | POA: Diagnosis not present

## 2019-08-05 DIAGNOSIS — K5900 Constipation, unspecified: Secondary | ICD-10-CM | POA: Diagnosis present

## 2019-08-05 DIAGNOSIS — E876 Hypokalemia: Secondary | ICD-10-CM | POA: Diagnosis present

## 2019-08-05 DIAGNOSIS — R221 Localized swelling, mass and lump, neck: Secondary | ICD-10-CM | POA: Diagnosis present

## 2019-08-05 DIAGNOSIS — J432 Centrilobular emphysema: Principal | ICD-10-CM | POA: Diagnosis present

## 2019-08-05 DIAGNOSIS — C329 Malignant neoplasm of larynx, unspecified: Secondary | ICD-10-CM | POA: Diagnosis not present

## 2019-08-05 DIAGNOSIS — C4442 Squamous cell carcinoma of skin of scalp and neck: Secondary | ICD-10-CM | POA: Diagnosis not present

## 2019-08-05 DIAGNOSIS — I10 Essential (primary) hypertension: Secondary | ICD-10-CM | POA: Diagnosis present

## 2019-08-05 DIAGNOSIS — C321 Malignant neoplasm of supraglottis: Secondary | ICD-10-CM | POA: Diagnosis present

## 2019-08-05 DIAGNOSIS — Z91013 Allergy to seafood: Secondary | ICD-10-CM

## 2019-08-05 DIAGNOSIS — E43 Unspecified severe protein-calorie malnutrition: Secondary | ICD-10-CM | POA: Diagnosis present

## 2019-08-05 DIAGNOSIS — Z66 Do not resuscitate: Secondary | ICD-10-CM | POA: Diagnosis not present

## 2019-08-05 DIAGNOSIS — F17210 Nicotine dependence, cigarettes, uncomplicated: Secondary | ICD-10-CM | POA: Diagnosis present

## 2019-08-05 DIAGNOSIS — Z23 Encounter for immunization: Secondary | ICD-10-CM

## 2019-08-05 DIAGNOSIS — Z20828 Contact with and (suspected) exposure to other viral communicable diseases: Secondary | ICD-10-CM | POA: Diagnosis present

## 2019-08-05 DIAGNOSIS — Z809 Family history of malignant neoplasm, unspecified: Secondary | ICD-10-CM

## 2019-08-05 DIAGNOSIS — J449 Chronic obstructive pulmonary disease, unspecified: Secondary | ICD-10-CM | POA: Diagnosis not present

## 2019-08-05 DIAGNOSIS — Z7901 Long term (current) use of anticoagulants: Secondary | ICD-10-CM | POA: Diagnosis not present

## 2019-08-05 DIAGNOSIS — Z794 Long term (current) use of insulin: Secondary | ICD-10-CM | POA: Diagnosis not present

## 2019-08-05 DIAGNOSIS — Z79899 Other long term (current) drug therapy: Secondary | ICD-10-CM | POA: Diagnosis not present

## 2019-08-05 DIAGNOSIS — Z930 Tracheostomy status: Secondary | ICD-10-CM | POA: Diagnosis not present

## 2019-08-05 HISTORY — DX: Chronic obstructive pulmonary disease, unspecified: J44.9

## 2019-08-05 HISTORY — DX: Localized swelling, mass and lump, neck: R22.1

## 2019-08-05 LAB — COMPREHENSIVE METABOLIC PANEL
ALT: 13 U/L (ref 0–44)
AST: 25 U/L (ref 15–41)
Albumin: 3.1 g/dL — ABNORMAL LOW (ref 3.5–5.0)
Alkaline Phosphatase: 66 U/L (ref 38–126)
Anion gap: 15 (ref 5–15)
BUN: 5 mg/dL — ABNORMAL LOW (ref 6–20)
CO2: 22 mmol/L (ref 22–32)
Calcium: 8.7 mg/dL — ABNORMAL LOW (ref 8.9–10.3)
Chloride: 99 mmol/L (ref 98–111)
Creatinine, Ser: 0.71 mg/dL (ref 0.61–1.24)
GFR calc Af Amer: >60 mL/min (ref >60)
GFR calc non Af Amer: >60 mL/min (ref >60)
Glucose, Bld: 82 mg/dL (ref 70–99)
Potassium: 3.4 mmol/L — ABNORMAL LOW (ref 3.5–5.1)
Sodium: 136 mmol/L (ref 135–145)
Total Bilirubin: 0.7 mg/dL (ref 0.3–1.2)
Total Protein: 6.8 g/dL (ref 6.5–8.1)

## 2019-08-05 LAB — CBC WITH DIFFERENTIAL/PLATELET
Abs Immature Granulocytes: 0.02 10*3/uL (ref 0.00–0.07)
Basophils Absolute: 0 10*3/uL (ref 0.0–0.1)
Basophils Relative: 0 %
Eosinophils Absolute: 0.1 10*3/uL (ref 0.0–0.5)
Eosinophils Relative: 1 %
HCT: 42.7 % (ref 39.0–52.0)
Hemoglobin: 13.7 g/dL (ref 13.0–17.0)
Immature Granulocytes: 0 %
Lymphocytes Relative: 29 %
Lymphs Abs: 1.4 10*3/uL (ref 0.7–4.0)
MCH: 30.6 pg (ref 26.0–34.0)
MCHC: 32.1 g/dL (ref 30.0–36.0)
MCV: 95.5 fL (ref 80.0–100.0)
Monocytes Absolute: 0.4 10*3/uL (ref 0.1–1.0)
Monocytes Relative: 8 %
Neutro Abs: 3 10*3/uL (ref 1.7–7.7)
Neutrophils Relative %: 62 %
Platelets: 257 10*3/uL (ref 150–400)
RBC: 4.47 MIL/uL (ref 4.22–5.81)
RDW: 12.1 % (ref 11.5–15.5)
WBC: 4.9 10*3/uL (ref 4.0–10.5)
nRBC: 0 % (ref 0.0–0.2)

## 2019-08-05 LAB — SARS CORONAVIRUS 2 BY RT PCR (HOSPITAL ORDER, PERFORMED IN ~~LOC~~ HOSPITAL LAB): SARS Coronavirus 2: NEGATIVE

## 2019-08-05 LAB — TSH: TSH: 0.924 u[IU]/mL (ref 0.350–4.500)

## 2019-08-05 MED ORDER — DM-GUAIFENESIN ER 30-600 MG PO TB12
1.0000 | ORAL_TABLET | Freq: Two times a day (BID) | ORAL | Status: DC | PRN
  Administered 2019-08-08 – 2019-08-13 (×5): 1 via ORAL
  Filled 2019-08-05 (×6): qty 1

## 2019-08-05 MED ORDER — IPRATROPIUM BROMIDE HFA 17 MCG/ACT IN AERS: 2.0000 | INHALATION_SPRAY | RESPIRATORY_TRACT | Status: DC

## 2019-08-05 MED ORDER — ONDANSETRON HCL 4 MG/2ML IJ SOLN: 4.0000 mg | Freq: Three times a day (TID) | INTRAMUSCULAR | Status: DC | PRN

## 2019-08-05 MED ORDER — ALBUTEROL SULFATE HFA 108 (90 BASE) MCG/ACT IN AERS
2.0000 | INHALATION_SPRAY | Freq: Once | RESPIRATORY_TRACT | Status: AC
  Administered 2019-08-05: 2 via RESPIRATORY_TRACT
  Filled 2019-08-05: qty 6.7

## 2019-08-05 MED ORDER — ALBUTEROL SULFATE HFA 108 (90 BASE) MCG/ACT IN AERS: 2.0000 | INHALATION_SPRAY | RESPIRATORY_TRACT | 2 refills | Status: DC | PRN

## 2019-08-05 MED ORDER — DEXAMETHASONE SODIUM PHOSPHATE 10 MG/ML IJ SOLN
10.0000 mg | Freq: Once | INTRAMUSCULAR | Status: AC
  Administered 2019-08-05: 19:00:00 10 mg via INTRAVENOUS
  Filled 2019-08-05: qty 1

## 2019-08-05 MED ORDER — ENSURE ENLIVE PO LIQD
237.0000 mL | Freq: Three times a day (TID) | ORAL | Status: DC
  Administered 2019-08-05 – 2019-08-13 (×13): 237 mL via ORAL
  Filled 2019-08-05: qty 237

## 2019-08-05 MED ORDER — SPIRIVA HANDIHALER 18 MCG IN CAPS: 18.0000 ug | ORAL_CAPSULE | Freq: Every day | RESPIRATORY_TRACT | 2 refills | Status: DC

## 2019-08-05 MED ORDER — POLYETHYLENE GLYCOL 3350 17 GM/SCOOP PO POWD: 17.0000 g | Freq: Every day | ORAL | 2 refills | Status: DC

## 2019-08-05 MED ORDER — ACETAMINOPHEN 325 MG PO TABS
650.0000 mg | ORAL_TABLET | Freq: Four times a day (QID) | ORAL | Status: DC | PRN
  Administered 2019-08-10 – 2019-08-11 (×2): 650 mg via ORAL
  Filled 2019-08-05 (×2): qty 2

## 2019-08-05 MED ORDER — SODIUM CHLORIDE 0.9 % IV BOLUS
1000.0000 mL | Freq: Once | INTRAVENOUS | Status: AC
  Administered 2019-08-05: 1000 mL via INTRAVENOUS

## 2019-08-05 MED ORDER — SODIUM CHLORIDE 0.9 % IV SOLN
INTRAVENOUS | Status: DC
  Administered 2019-08-06 – 2019-08-10 (×8): via INTRAVENOUS

## 2019-08-05 MED ORDER — IPRATROPIUM BROMIDE 0.02 % IN SOLN
0.5000 mg | RESPIRATORY_TRACT | Status: DC
  Administered 2019-08-05: 23:00:00 0.5 mg via RESPIRATORY_TRACT
  Filled 2019-08-05 (×2): qty 2.5

## 2019-08-05 MED ORDER — IOHEXOL 300 MG/ML  SOLN
75.0000 mL | Freq: Once | INTRAMUSCULAR | Status: AC | PRN
  Administered 2019-08-05: 16:00:00 75 mL via INTRAVENOUS

## 2019-08-05 MED ORDER — METHYLPREDNISOLONE SODIUM SUCC 125 MG IJ SOLR
60.0000 mg | Freq: Three times a day (TID) | INTRAMUSCULAR | Status: DC
  Administered 2019-08-05 – 2019-08-08 (×9): 60 mg via INTRAVENOUS
  Filled 2019-08-05 (×9): qty 2

## 2019-08-05 NOTE — ED Triage Notes (Signed)
Pt here for airway constriction. CT results confirmed cancer causing constriction to airway.

## 2019-08-05 NOTE — Progress Notes (Signed)
Dr Carlis Abbott, radiologist, called to report CT findings. 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 He advised to have emergency ENT referral through the ED. Called patient and spoke with sister who is his emergency contact. Advised her to have patient go to [facility] ED for further evaluation due to compromised airway. Sister agrees to take patient to the ED for further evaluation.

## 2019-08-05 NOTE — ED Notes (Signed)
ENT doctor at the bedside.
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
## 2019-08-05 NOTE — Patient Instructions (Signed)
Constipation, Adult Constipation is when a person:  Poops (has a bowel movement) fewer times in a week than normal.  Has a hard time pooping.  Has poop that is dry, hard, or bigger than normal. Follow these instructions at home: Eating and drinking   Eat foods that have a lot of fiber, such as: ? Fresh fruits and vegetables. ? Whole grains. ? Beans.  Eat less of foods that are high in fat, low in fiber, or overly processed, such as: ? [place] fries. ? Hamburgers. ? Cookies. ? Candy. ? Soda.  Drink enough fluid to keep your pee (urine) clear or pale yellow. General instructions  Exercise regularly or as told by your doctor.  Go to the restroom when you feel like you need to poop. Do not hold it in.  Take over-the-counter and prescription medicines only as told by your doctor. These include any fiber supplements.  Do pelvic floor retraining exercises, such as: ? Doing deep breathing while relaxing your lower belly (abdomen). ? Relaxing your pelvic floor while pooping.  Watch your condition for any changes.  Keep all follow-up visits as told by your doctor. This is important. Contact a doctor if:  You have pain that gets worse.  You have a fever.  You have not pooped for 4 days.  You throw up (vomit).  You are not hungry.  You lose weight.  You are bleeding from the anus.  You have thin, pencil-like poop (stool). Get help right away if:  You have a fever, and your symptoms suddenly get worse.  You leak poop or have blood in your poop.  Your belly feels hard or bigger than normal (is bloated).  You have very bad belly pain.  You feel dizzy or you faint. This information is not intended to replace advice given to you by your health care provider. Make sure you discuss any questions you have with your health care provider. Document Released: 06/02/2008 Document Revised: 11/27/2017 Document Reviewed: 06/04/2016 Elsevier Patient Education  2020 [employer].
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
## 2019-08-05 NOTE — ED Notes (Signed)
Ct scan done at [facility] of throat, MD called them with the report of edema and concerned about constriction of airway. Seen at [facility], treated and sent home.  Returned to [facility] and was treated with breathing tx. And was dc'd.   Edema started to neck 2 days ago. Audible wheezing to noted to upper airway.  No stridor noted.  Voice is hoarse States able to swallow fluids, but foods are getting stuck.  Does c/o difficulty breathing at times.
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
## 2019-08-05 NOTE — Progress Notes (Signed)
Patient [facility]   Progress Note: General Provider: [name], FNP  SUBJECTIVE:   Perry Waller is a 58 y.o. male who  has a past medical history of Acute respiratory acidosis (06/29/2019) and Arthritis.. Patient presents today for COPD, Edema (both sides of neck ), and Constipation  Patient presents for concerns of constipation. He reports not being able to swallow. Accompanied by family member who also serves as the historian. She reports that Mr. Krajewski has not had a bowel movement in over 2 weeks. She has tried to give him ensure. He is having difficulty with swallowing anything but soft foods. He has lost 28 pounds since June 29, 2019. Today he is 100 pounds. He states that he is having shortness of breath and wheezing. Continues to smoke daily and is not interested in quitting.  Patient referred to pulmonology at the last visit. Has appointment schedules for 08/09/2019. Also states that he has laryngitis.    Review of Systems  Constitutional: Positive for malaise/fatigue and weight loss.  HENT:       Difficulty with swallowing.    Respiratory: Positive for cough, sputum production, shortness of breath and wheezing.   All other systems reviewed and are negative.    OBJECTIVE: BP 98/64 (BP Location: Left Arm, Patient Position: Sitting, Cuff Size: Normal)   Pulse 100   Temp 97.8 F (36.6 C) (Oral)   Resp 20   Ht 5\' 11"  (1.803 m)   Wt 100 lb (45.4 kg)   SpO2 97%   BMI 13.95 kg/m   Wt Readings from Last 3 Encounters:  08/05/19 100 lb (45.4 kg)  07/13/19 103 lb (46.7 kg)  06/29/19 128 lb (58.1 kg)     Physical Exam Vitals signs and nursing note reviewed.  Constitutional:      General: He is not in acute distress.    Appearance: Normal appearance.  HENT:     Head: Normocephalic and atraumatic.     Mouth/Throat:     Mouth: Mucous membranes are moist.     Comments: dysphonia Eyes:     Extraocular Movements: Extraocular movements  intact.     Conjunctiva/sclera: Conjunctivae normal.     Pupils: Pupils are equal, round, and reactive to light.  Neck:     Comments: There are palpable large masses noted to the neck with anterior cervical adenopathy.  Cardiovascular:     Rate and Rhythm: Normal rate and regular rhythm.     Heart sounds: No murmur.  Pulmonary:     Effort: Pulmonary effort is normal.     Breath sounds: Wheezing (throughout all lung fields. Patient with laryngitis. ) present.  Abdominal:     General: Abdomen is flat. Bowel sounds are increased.     Palpations: Abdomen is soft.     Tenderness: There is no abdominal tenderness.  Musculoskeletal: Normal range of motion.  Lymphadenopathy:     Cervical: Cervical adenopathy present.  Skin:    General: Skin is warm and dry.  Neurological:     Mental Status: He is alert and oriented to person, place, and time.  Psychiatric:        Mood and Affect: Mood normal.        Behavior: Behavior normal.        Thought Content: Thought content normal.        Judgment: Judgment normal.     ASSESSMENT/PLAN:  1. Chronic obstructive pulmonary disease, unspecified COPD type (HCC) - albuterol (VENTOLIN HFA)  108 (90 Base) MCG/ACT inhaler; Inhale 2 puffs into the lungs every 4 (four) hours as needed for wheezing or shortness of breath.  Dispense: 18 g; Refill: 2 - tiotropium (SPIRIVA HANDIHALER) 18 MCG inhalation capsule; Place 1 capsule (18 mcg total) into inhaler and inhale daily.  Dispense: 30 capsule; Refill: 2  2. Constipation, unspecified constipation type - polyethylene glycol powder (GLYCOLAX/MIRALAX) 17 GM/SCOOP powder; Take 17 g by mouth daily.  Dispense: 578 g; Refill: 2  3. Localized swelling, mass or lump of neck - US SOFT TISSUE HEAD AND NECK; Future  Dr. Carlis Abbott, radiologist, called after [place] of neck stating that patient needs a stat CT with contrast due to him not being able to visualize Mr. Dunn airway on ultrasound. 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 Order placed for stat CT due to possible airway obstruction.   Return in about 2 weeks (around 08/19/2019).    The patient was given clear instructions to go to ER or return to medical center if symptoms do not improve, worsen or new problems develop. The patient verbalized understanding and agreed with plan of care.   Ms. Doug Sou. Nathaneil Canary, FNP-BC [facility] [address] [place], [place] [address] [phone]

## 2019-08-06 LAB — CBC WITH DIFFERENTIAL/PLATELET
Abs Immature Granulocytes: 0.02 10*3/uL (ref 0.00–0.07)
Basophils Absolute: 0 10*3/uL (ref 0.0–0.1)
Basophils Relative: 0 %
Eosinophils Absolute: 0 10*3/uL (ref 0.0–0.5)
Eosinophils Relative: 0 %
HCT: 36.3 % — ABNORMAL LOW (ref 39.0–52.0)
Hemoglobin: 12 g/dL — ABNORMAL LOW (ref 13.0–17.0)
Immature Granulocytes: 0 %
Lymphocytes Relative: 15 %
Lymphs Abs: 1.1 10*3/uL (ref 0.7–4.0)
MCH: 30.8 pg (ref 26.0–34.0)
MCHC: 33.1 g/dL (ref 30.0–36.0)
MCV: 93.1 fL (ref 80.0–100.0)
Monocytes Absolute: 0.2 10*3/uL (ref 0.1–1.0)
Monocytes Relative: 3 %
Neutro Abs: 5.7 10*3/uL (ref 1.7–7.7)
Neutrophils Relative %: 82 %
Platelets: 262 10*3/uL (ref 150–400)
RBC: 3.9 MIL/uL — ABNORMAL LOW (ref 4.22–5.81)
RDW: 12.2 % (ref 11.5–15.5)
WBC: 7 10*3/uL (ref 4.0–10.5)
nRBC: 0 % (ref 0.0–0.2)

## 2019-08-06 LAB — RESPIRATORY PANEL BY PCR

## 2019-08-06 LAB — BASIC METABOLIC PANEL
Anion gap: 10 (ref 5–15)
BUN: 6 mg/dL (ref 6–20)
CO2: 23 mmol/L (ref 22–32)
Calcium: 8.4 mg/dL — ABNORMAL LOW (ref 8.9–10.3)
Chloride: 105 mmol/L (ref 98–111)
Creatinine, Ser: 0.8 mg/dL (ref 0.61–1.24)
GFR calc Af Amer: >60 mL/min (ref >60)
GFR calc non Af Amer: >60 mL/min (ref >60)
Glucose, Bld: 170 mg/dL — ABNORMAL HIGH (ref 70–99)
Potassium: 4 mmol/L (ref 3.5–5.1)
Sodium: 138 mmol/L (ref 135–145)

## 2019-08-06 LAB — APTT: aPTT: 31 seconds (ref 24–36)

## 2019-08-06 LAB — MAGNESIUM: Magnesium: 1.6 mg/dL — ABNORMAL LOW (ref 1.7–2.4)

## 2019-08-06 LAB — PROTIME-INR
INR: 1.1 (ref 0.8–1.2)
Prothrombin Time: 13.9 seconds (ref 11.4–15.2)

## 2019-08-06 LAB — TYPE AND SCREEN
ABO/RH(D): O POS
Antibody Screen: NEGATIVE

## 2019-08-06 LAB — HIV ANTIBODY (ROUTINE TESTING W REFLEX): HIV Screen 4th Generation wRfx: NONREACTIVE

## 2019-08-06 MED ORDER — PNEUMOCOCCAL VAC POLYVALENT 25 MCG/0.5ML IJ INJ
0.5000 mL | INJECTION | INTRAMUSCULAR | Status: AC
  Administered 2019-08-07: 0.5 mL via INTRAMUSCULAR
  Filled 2019-08-06: qty 0.5

## 2019-08-06 MED ORDER — IPRATROPIUM-ALBUTEROL 0.5-2.5 (3) MG/3ML IN SOLN
3.0000 mL | Freq: Three times a day (TID) | RESPIRATORY_TRACT | Status: DC
  Administered 2019-08-06 – 2019-08-07 (×4): 3 mL via RESPIRATORY_TRACT
  Filled 2019-08-06 (×4): qty 3

## 2019-08-06 MED ORDER — POTASSIUM CHLORIDE CRYS ER 20 MEQ PO TBCR: 20.0000 meq | EXTENDED_RELEASE_TABLET | Freq: Once | ORAL | Status: DC

## 2019-08-06 MED ORDER — MAGNESIUM SULFATE 2 GM/50ML IV SOLN
2.0000 g | Freq: Once | INTRAVENOUS | Status: AC
  Administered 2019-08-06: 2 g via INTRAVENOUS
  Filled 2019-08-06: qty 50

## 2019-08-06 MED ORDER — POLYETHYLENE GLYCOL 3350 17 G PO PACK
17.0000 g | PACK | Freq: Every day | ORAL | Status: DC
  Administered 2019-08-07 – 2019-08-16 (×6): 17 g via ORAL
  Filled 2019-08-06 (×8): qty 1

## 2019-08-06 MED ORDER — ENOXAPARIN SODIUM 30 MG/0.3ML ~~LOC~~ SOLN
30.0000 mg | SUBCUTANEOUS | Status: DC
  Administered 2019-08-06 – 2019-08-10 (×4): 30 mg via SUBCUTANEOUS
  Filled 2019-08-06 (×5): qty 0.3

## 2019-08-06 MED ORDER — POTASSIUM CHLORIDE 20 MEQ/15ML (10%) PO SOLN
20.0000 meq | Freq: Once | ORAL | Status: AC
  Administered 2019-08-06: 20 meq via ORAL
  Filled 2019-08-06: qty 15

## 2019-08-06 MED ORDER — ENOXAPARIN SODIUM 40 MG/0.4ML ~~LOC~~ SOLN: 40.0000 mg | SUBCUTANEOUS | Status: DC

## 2019-08-06 MED ORDER — NICOTINE 21 MG/24HR TD PT24
21.0000 mg | MEDICATED_PATCH | Freq: Every day | TRANSDERMAL | Status: DC
  Administered 2019-08-08 – 2019-08-17 (×9): 21 mg via TRANSDERMAL
  Filled 2019-08-06 (×11): qty 1

## 2019-08-06 MED ORDER — ALBUTEROL SULFATE (2.5 MG/3ML) 0.083% IN NEBU
2.5000 mg | INHALATION_SOLUTION | Freq: Four times a day (QID) | RESPIRATORY_TRACT | Status: DC | PRN
  Administered 2019-08-06 (×2): 2.5 mg via RESPIRATORY_TRACT
  Filled 2019-08-06 (×2): qty 3

## 2019-08-06 MED ORDER — ADULT MULTIVITAMIN W/MINERALS CH
1.0000 | ORAL_TABLET | Freq: Every day | ORAL | Status: DC
  Administered 2019-08-06 – 2019-08-17 (×10): 1 via ORAL
  Filled 2019-08-06 (×9): qty 1

## 2019-08-06 MED ORDER — NAPROXEN 250 MG PO TABS
500.0000 mg | ORAL_TABLET | Freq: Every day | ORAL | Status: DC
  Administered 2019-08-07: 500 mg via ORAL
  Filled 2019-08-06 (×2): qty 2

## 2019-08-06 MED ORDER — SENNOSIDES-DOCUSATE SODIUM 8.6-50 MG PO TABS: 1.0000 | ORAL_TABLET | Freq: Every evening | ORAL | Status: DC | PRN

## 2019-08-06 NOTE — Progress Notes (Signed)
Admitted 4E22 VSS placed on monitor NSR, CHG completed, oriented to unit routine., call bell and phone within reach.

## 2019-08-06 NOTE — Progress Notes (Addendum)
Subjective/Chief Complaint: He is doing well but mad about being here and no biopsy   Objective: Vital signs in last 24 hours: Temp:  [97.8 F (36.6 C)-98.5 F (36.9 C)] 97.8 F (36.6 C) (08/08 0751) Pulse Rate:  [37-101] 62 (08/08 0751) Resp:  [16-25] 20 (08/08 0751) BP: (98-147)/(64-110) 135/95 (08/08 0751) SpO2:  [95 %-100 %] 95 % (08/08 0831) Weight:  [45.4 kg-47 kg] 47 kg (08/08 0120) Last BM Date: 08/01/19  Intake/Output from previous day: 08/07 0701 - 08/08 0700 In: 1000 [IV Piggyback:1000] Out: -  Intake/Output this shift: No intake/output data recorded.  vocie same and no stridor or breathing issues  Lab Results:  Recent Labs    08/05/19 1821  WBC 4.9  HGB 13.7  HCT 42.7  PLT 257   BMET Recent Labs    08/05/19 1821  NA 136  K 3.4*  CL 99  CO2 22  GLUCOSE 82  BUN 5*  CREATININE 0.71  CALCIUM 8.7*   PT/INR Recent Labs    08/06/19 0248  LABPROT 13.9  INR 1.1   ABG No results for input(s): PHART, HCO3 in the last 72 hours.  Invalid input(s): PCO2, PO2  Studies/Results: Ct Soft Tissue Neck W Contrast  Result Date: 08/05/2019 CLINICAL DATA:  Neck masses. Difficulty breathing. Hoarseness. Abnormal ultrasound today. EXAM: CT NECK WITH CONTRAST TECHNIQUE: Multidetector CT imaging of the neck was performed using the standard protocol following the bolus administration of intravenous contrast. CONTRAST:  76mL OMNIPAQUE IOHEXOL 300 MG/ML  SOLN COMPARISON:  Ultrasound neck today FINDINGS: Pharynx and larynx: Large mass involving the larynx. The mass is bilateral but larger on the right than the left. Overall, the mass measures approximately 38 x 36 mm. 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 The mass is narrowing the airway which is 8 mm in diameter. Possible subglottic extension especially on the right. Tumor extends through the thyroid cartilage on the right and possibly on the left. Mass extends superiorly into the  aryepiglottic fold and piriform sinus on the right Tongue and tonsils normal.  Epiglottis normal. Salivary glands: No inflammation, mass, or stone. Thyroid: No thyroid mass. 3 mm left thyroid nodule. Normal thyroid size Lymph nodes: Malignant adenopathy on the right. Posterior right upper lymph node 8.6 mm with irregular enhancement compatible with tumor. 9.5 mm adjacent lymph node posteriorly. Right level 2 lymph node with malignant enhancement pattern measuring 19 x 31 mm. Right posterior lymph node 15 mm in the mid neck. Subcentimeter enhancing lesions in the level 4 station compatible with metastatic disease on the right. Left level 4 lymph node 8 mm likely metastatic. Vascular: Right jugular vein is compressed due to enlarged lymph node on the right and appears severely stenotic or occluded. Left jugular vein enhances normally. Both carotid arteries are patent. Limited intracranial: Negative Visualized orbits: Not imaged Mastoids and visualized paranasal sinuses: Mucosal edema and air-fluid level left maxillary sinus. Mild mucosal edema right maxillary sinus. Skeleton: Cervical spondylosis.  No skeletal lesions. 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 Other: None IMPRESSION: 1. 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 The mass originated on the right and crosses the midline anteriorly into the left larynx. The tumor extends through the thyroid cartilage on the right and possibly on the left. Airway is significantly narrowed to 8 mm at the level of the tumor. 2. Malignant adenopathy in the neck. 8 mm probable malignant node in the left level 4 lymph node chain.  3. Compression right jugular vein due to enlarged lymph node on the right. 4. These results were called by telephone at the time of interpretation on 08/05/2019 at 4:50 pm to [name] NP , who verbally acknowledged these results. I recommend emergency evaluation of the airway Electronically Signed   By: Franchot Gallo M.D.   On: 08/05/2019 16:54   US Soft Tissue Head And Neck  Result Date: 08/05/2019 CLINICAL DATA:  Bilateral neck swelling/mass/lump. EXAM: ULTRASOUND OF HEAD/NECK SOFT TISSUES TECHNIQUE: Ultrasound examination of the head and neck soft tissues was performed in the area of clinical concern. COMPARISON:  None. FINDINGS: Anterior neck masses are present bilaterally and demonstrate heterogeneous internal echotexture with possible small areas of cystic change. More laterally located right neck masses measure 2.4 x 3.4 x 1.8 cm in level II and 1.5 x 2.6 x 1.1 cm in level IV. A more medially located right neck mass superior to the thyroid measures 3.2 x 4.9 x 2.7 cm. A left neck mass in level III measures 0.8 x 1.6 x 0.8 cm. IMPRESSION: Bilateral neck masses likely reflecting metastatic lymphadenopathy. Contrast-enhanced neck CT is recommended for further evaluation. Electronically Signed   By: Logan Bores M.D.   On: 08/05/2019 15:20   Dg Chest Port 1 View  Result Date: 08/05/2019 CLINICAL DATA:  Shortness of breath. EXAM: PORTABLE CHEST 1 VIEW COMPARISON:  June 29, 2019 FINDINGS: No pneumothorax. Scarring remains in the right apex. Hyperinflation of the lungs suggests COPD or emphysema. The cardiomediastinal silhouette is unremarkable. No other acute abnormalities. IMPRESSION: Chronic scarring in the right apex. Hyperinflation of the lungs suggesting COPD or emphysema. Electronically Signed   By: Dorise Bullion III M.D   On: 08/05/2019 19:30    Anti-infectives: Anti-infectives (From admission, onward)   None      Assessment/Plan: s/p * No surgery found * he is due to have Ultrasound guided FNA. He can have outpatient workup if  not able to get since he seems to have a stable airway. Patient wants to eat and go home. After talking to the patient about the tumor he is again totally against a laryngectomy. We need to get a radiation oncology consult. He will need a trach to get through the treatment  given his narrow airway and location of the tumor. He is in agreement with the trach and a direct laryngoscopy. We can cancel the FNA and schedule the above procedure on Monday or Tuesday.   LOS: 1 day    Lupe Bonner 08/06/2019

## 2019-08-06 NOTE — Plan of Care (Signed)
?  Problem: Clinical Measurements: ?Goal: Will remain free from infection ?Outcome: Progressing ?  ?

## 2019-08-06 NOTE — Progress Notes (Signed)
PROGRESS NOTE  Perry Waller RAQ:[id number] DOB: Jul 17, 1961 DOA: 08/05/2019 PCP: [name], FNP  HPI/Recap of past 24 hours: HPI from Dr Davis Gourd is a 58 y.o. male with medical history significant of COPD, tobacco abuse, arthritis, who presents with shortness breath, neck swelling. Patient states that he has been having cough, shortness of breath, wheezing for more than 1 week.  No chest pain.  No fever or chills.  He notices neck swelling and has difficult swallowing.  He states that he has lost more than 40 pounds in the past several months. Pt was seen by outpatient provider this morning who referred him for CT neck.Based on imaging findings there was concern for airway compromise, and patient was instructed to go to the ER. In the ED, oxygen saturation 97% on room air, chest x-ray showed COPD with emphysema. Patient admitted to stepdown as inpatient. Dr. Janace Hoard of ENT was consulted.   Today, patient complained of pain kept n.p.o., while no biopsy was done.  Denies any worsening shortness of breath, cough, chest pain, fever/chills.  Still noted to be hoarse.  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 Presented with hoarse voice, bilateral wheezing, SOB Currently somewhat stable, has airway narrowing to 8 mm on CT CT soft tissue neck showed large mass of the larynx consistent with carcinoma, compression right jugular vein due to enlarged lymph node on the right ENT is on board, plan for a trach and a direct laryngoscopy for treatment, likely on Monday or Tuesday of next week Radiation oncology consulted SLP consulted Continue IV steroid Monitor closely in the progressive unit for signs of respiratory compromise, may require intubation  Acute COPD exacerbation Likely worsened by airway narrowing secondary to neck  mass Afebrile, with no leukocytosis COVID-19 negative, respiratory viral panel negative Chest x-ray showed hyperinflation of the lungs, suggesting COPD or emphysema Duo nebs, IV Solu-Medrol, PRN inhalers, Mucinex Incentive spirometry, supplemental oxygen PRN  Hypokalemia/hypomagnesemia  Replace PRN  Tobacco abuse: Advised to quit Nicotine patch         Malnutrition Type:      Malnutrition Characteristics:      Nutrition Interventions:       Estimated body mass index is 17.24 kg/m as calculated from the following:   Height as of this encounter: 5\' 5"  (1.651 m).   Weight as of this encounter: 47 kg.     Code Status: Full  Family Communication: None at bedside  Disposition Plan: To be determined   Consultants:  ENT  Radiation oncology  Procedures:  None  Antimicrobials:  None  DVT prophylaxis: Lovenox   Objective: Vitals:   08/06/19 0354 08/06/19 0751 08/06/19 0831 08/06/19 1141  BP: (!) 122/93 (!) 135/95  122/89  Pulse: 69 62  67  Resp: 19 20  17   Temp: 97.8 F (36.6 C) 97.8 F (36.6 C)  98.9 F (37.2 C)  TempSrc: Oral Oral  Oral  SpO2: 98% 98% 95% 100%  Weight:      Height:        Intake/Output Summary (Last 24 hours) at 08/06/2019 1418 Last data filed at 08/06/2019 0933 Gross per 24 hour  Intake 1000 ml  Output --  Net 1000 ml   Filed Weights   08/06/19 0120  Weight: 47 kg    Exam:  General: NAD, hoarse  voice, no stridor noted  Cardiovascular: S1, S2 present  Respiratory:  Mild bilateral wheezing noted, decreased air entry bilaterally  Abdomen: Soft, nontender, nondistended, bowel sounds present  Musculoskeletal: No bilateral pedal edema noted  Skin: Normal  Psychiatry: Normal mood   Data Reviewed: CBC: Recent Labs  Lab 08/05/19 1821  WBC 4.9  NEUTROABS 3.0  HGB 13.7  HCT 42.7  MCV 95.5  PLT 657   Basic Metabolic Panel: Recent Labs  Lab 08/05/19 1821 08/06/19 0248  NA 136  --   K 3.4*  --    CL 99  --   CO2 22  --   GLUCOSE 82  --   BUN 5*  --   CREATININE 0.71  --   CALCIUM 8.7*  --   MG  --  1.6*   GFR: Estimated Creatinine Clearance: 66.9 mL/min (by C-G formula based on SCr of 0.71 mg/dL). Liver Function Tests: Recent Labs  Lab 08/05/19 1821  AST 25  ALT 13  ALKPHOS 66  BILITOT 0.7  PROT 6.8  ALBUMIN 3.1*   No results for input(s): LIPASE, AMYLASE in the last 168 hours. No results for input(s): AMMONIA in the last 168 hours. Coagulation Profile: Recent Labs  Lab 08/06/19 0248  INR 1.1   Cardiac Enzymes: No results for input(s): CKTOTAL, CKMB, CKMBINDEX, TROPONINI in the last 168 hours. BNP (last 3 results) No results for input(s): PROBNP in the last 8760 hours. HbA1C: No results for input(s): HGBA1C in the last 72 hours. CBG: No results for input(s): GLUCAP in the last 168 hours. Lipid Profile: No results for input(s): CHOL, HDL, LDLCALC, TRIG, CHOLHDL, LDLDIRECT in the last 72 hours. Thyroid Function Tests: Recent Labs    08/05/19 2000  TSH 0.924   Anemia Panel: No results for input(s): VITAMINB12, FOLATE, FERRITIN, TIBC, IRON, RETICCTPCT in the last 72 hours. Urine analysis:    Component Value Date/Time   COLORURINE YELLOW 10/17/2011 2052   APPEARANCEUR CLEAR 10/17/2011 2052   LABSPEC 1.024 10/17/2011 2052   PHURINE 6.5 10/17/2011 2052   GLUCOSEU NEGATIVE 10/17/2011 2052   HGBUR TRACE (A) 10/17/2011 2052   BILIRUBINUR NEGATIVE 10/17/2011 2052   KETONESUR NEGATIVE 10/17/2011 2052   PROTEINUR NEGATIVE 10/17/2011 2052   UROBILINOGEN 0.2 10/17/2011 2052   NITRITE NEGATIVE 10/17/2011 2052   LEUKOCYTESUR NEGATIVE 10/17/2011 2052   Sepsis Labs: @LABRCNTIP (procalcitonin:4,lacticidven:4)  ) Recent Results (from the past 240 hour(s))  SARS Coronavirus 2 [facility] order, Performed in [facility] hospital lab) Nasopharyngeal Nasopharyngeal Swab     Status: None   Collection Time: 08/05/19  7:35 PM   Specimen: Nasopharyngeal Swab  Result  Value Ref Range Status   SARS Coronavirus 2 NEGATIVE NEGATIVE Final    Comment: (NOTE) If result is NEGATIVE SARS-CoV-2 target nucleic acids are NOT DETECTED. The SARS-CoV-2 RNA is generally detectable in upper and lower  respiratory specimens during the acute phase of infection. The lowest  concentration of SARS-CoV-2 viral copies this assay can detect is 250  copies / mL. A negative result does not preclude SARS-CoV-2 infection  and should not be used as the sole basis for treatment or other  patient management decisions.  A negative result may occur with  improper specimen collection / handling, submission of specimen other  than nasopharyngeal swab, presence of viral mutation(s) within the  areas targeted by this assay, and inadequate number of viral copies  (<250 copies / mL). A negative result must be combined with clinical  observations, patient history, and epidemiological  information. If result is POSITIVE SARS-CoV-2 target nucleic acids are DETECTED. The SARS-CoV-2 RNA is generally detectable in upper and lower  respiratory specimens dur ing the acute phase of infection.  Positive  results are indicative of active infection with SARS-CoV-2.  Clinical  correlation with patient history and other diagnostic information is  necessary to determine patient infection status.  Positive results do  not rule out bacterial infection or co-infection with other viruses. If result is PRESUMPTIVE POSTIVE SARS-CoV-2 nucleic acids MAY BE PRESENT.   A presumptive positive result was obtained on the submitted specimen  and confirmed on repeat testing.  While 2019 novel coronavirus  (SARS-CoV-2) nucleic acids may be present in the submitted sample  additional confirmatory testing may be necessary for epidemiological  and / or clinical management purposes  to differentiate between  SARS-CoV-2 and other Sarbecovirus currently known to infect humans.  If clinically indicated additional testing  with an alternate test  methodology [phone]) is advised. The SARS-CoV-2 RNA is generally  detectable in upper and lower respiratory sp ecimens during the acute  phase of infection. The expected result is Negative. Fact Sheet for Patients:  StrictlyIdeas.no Fact Sheet for Healthcare Providers: BankingDealers.co.za This test is not yet approved or cleared by the [place] FDA and has been authorized for detection and/or diagnosis of SARS-CoV-2 by FDA under an Emergency Use Authorization (EUA).  This EUA will remain in effect (meaning this test can be used) for the duration of the COVID-19 declaration under Section 564(b)(1) of the Act, 21 U.S.C. section 360bbb-3(b)(1), unless the authorization is terminated or revoked sooner. Performed at [facility], [place] [address], [place], [place] [address]   Respiratory Panel by PCR     Status: None   Collection Time: 08/06/19  1:48 AM   Specimen: Nasopharyngeal Swab; Respiratory  Result Value Ref Range Status   Adenovirus NOT DETECTED NOT DETECTED Final   Coronavirus 229E NOT DETECTED NOT DETECTED Final    Comment: (NOTE) The Coronavirus on the Respiratory Panel, DOES NOT test for the novel  Coronavirus (2019 nCoV)    Coronavirus HKU1 NOT DETECTED NOT DETECTED Final   Coronavirus NL63 NOT DETECTED NOT DETECTED Final   Coronavirus OC43 NOT DETECTED NOT DETECTED Final   Metapneumovirus NOT DETECTED NOT DETECTED Final   Rhinovirus / Enterovirus NOT DETECTED NOT DETECTED Final   Influenza A NOT DETECTED NOT DETECTED Final   Influenza B NOT DETECTED NOT DETECTED Final   Parainfluenza Virus 1 NOT DETECTED NOT DETECTED Final   Parainfluenza Virus 2 NOT DETECTED NOT DETECTED Final   Parainfluenza Virus 3 NOT DETECTED NOT DETECTED Final   Parainfluenza Virus 4 NOT DETECTED NOT DETECTED Final   Respiratory Syncytial Virus NOT DETECTED NOT DETECTED Final   Bordetella pertussis NOT DETECTED NOT  DETECTED Final   Chlamydophila pneumoniae NOT DETECTED NOT DETECTED Final   Mycoplasma pneumoniae NOT DETECTED NOT DETECTED Final    Comment: Performed at [facility], [place]. [address], [place], [place] [address]      Studies: Ct Soft Tissue Neck W Contrast  Result Date: 08/05/2019 CLINICAL DATA:  Neck masses. Difficulty breathing. Hoarseness. Abnormal ultrasound today. EXAM: CT NECK WITH CONTRAST TECHNIQUE: Multidetector CT imaging of the neck was performed using the standard protocol following the bolus administration of intravenous contrast. CONTRAST:  3mL OMNIPAQUE IOHEXOL 300 MG/ML  SOLN COMPARISON:  Ultrasound neck today FINDINGS: Pharynx and larynx: Large mass involving the larynx. The mass is bilateral but larger on the right  than the left. Overall, the mass measures approximately 38 x 36 mm. 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 The mass is narrowing the airway which is 8 mm in diameter. Possible subglottic extension especially on the right. Tumor extends through the thyroid cartilage on the right and possibly on the left. Mass extends superiorly into the aryepiglottic fold and piriform sinus on the right Tongue and tonsils normal.  Epiglottis normal. Salivary glands: No inflammation, mass, or stone. Thyroid: No thyroid mass. 3 mm left thyroid nodule. Normal thyroid size Lymph nodes: Malignant adenopathy on the right. Posterior right upper lymph node 8.6 mm with irregular enhancement compatible with tumor. 9.5 mm adjacent lymph node posteriorly. Right level 2 lymph node with malignant enhancement pattern measuring 19 x 31 mm. Right posterior lymph node 15 mm in the mid neck. Subcentimeter enhancing lesions in the level 4 station compatible with metastatic disease on the right. Left level 4 lymph node 8 mm likely metastatic. Vascular: Right jugular vein is compressed due to enlarged lymph node on the right and appears severely stenotic or occluded. Left  jugular vein enhances normally. Both carotid arteries are patent. Limited intracranial: Negative Visualized orbits: Not imaged Mastoids and visualized paranasal sinuses: Mucosal edema and air-fluid level left maxillary sinus. Mild mucosal edema right maxillary sinus. Skeleton: Cervical spondylosis.  No skeletal lesions. 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 Other: None IMPRESSION: 1. 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 The mass originated on the right and crosses the midline anteriorly into the left larynx. The tumor extends through the thyroid cartilage on the right and possibly on the left. Airway is significantly narrowed to 8 mm at the level of the tumor. 2. Malignant adenopathy in the neck. 8 mm probable malignant node in the left level 4 lymph node chain. 3. Compression right jugular vein due to enlarged lymph node on the right. 4. These results were called by telephone at the time of interpretation on 08/05/2019 at 4:50 pm to [name] NP , who verbally acknowledged these results. I recommend emergency evaluation of the airway Electronically Signed   By: Franchot Gallo M.D.   On: 08/05/2019 16:54   US Soft Tissue Head And Neck  Result Date: 08/05/2019 CLINICAL DATA:  Bilateral neck swelling/mass/lump. EXAM: ULTRASOUND OF HEAD/NECK SOFT TISSUES TECHNIQUE: Ultrasound examination of the head and neck soft tissues was performed in the area of clinical concern. COMPARISON:  None. FINDINGS: Anterior neck masses are present bilaterally and demonstrate heterogeneous internal echotexture with possible small areas of cystic change. More laterally located right neck masses measure 2.4 x 3.4 x 1.8 cm in level II and 1.5 x 2.6 x 1.1 cm in level IV. A more medially located right neck mass superior to the thyroid measures 3.2 x 4.9 x 2.7 cm. A left neck mass in level III measures 0.8 x 1.6 x 0.8 cm. IMPRESSION: Bilateral neck masses likely reflecting metastatic  lymphadenopathy. Contrast-enhanced neck CT is recommended for further evaluation. Electronically Signed   By: Logan Bores M.D.   On: 08/05/2019 15:20   Dg Chest Port 1 View  Result Date: 08/05/2019 CLINICAL DATA:  Shortness of breath. EXAM: PORTABLE CHEST 1 VIEW COMPARISON:  June 29, 2019 FINDINGS: No pneumothorax. Scarring remains in the right apex. Hyperinflation of the lungs suggests COPD or emphysema. The cardiomediastinal silhouette is unremarkable. No other acute abnormalities. IMPRESSION: Chronic scarring in the right apex. Hyperinflation of the lungs suggesting COPD or emphysema. Electronically Signed  By: Dorise Bullion III M.D   On: 08/05/2019 19:30    Scheduled Meds:  feeding supplement (ENSURE ENLIVE)  237 mL Oral TID   ipratropium-albuterol  3 mL Nebulization TID   methylPREDNISolone (SOLU-MEDROL) injection  60 mg Intravenous TID   naproxen  500 mg Oral Daily   nicotine  21 mg Transdermal Daily   [START ON 08/07/2019] pneumococcal 23 valent vaccine  0.5 mL Intramuscular Tomorrow-1000   polyethylene glycol  17 g Oral Daily    Continuous Infusions:  sodium chloride 100 mL/hr at 08/06/19 0134     LOS: 1 day     Alma Friendly, MD [facility]  If 7PM-7AM, please contact night-coverage [email or web address] 08/06/2019, 2:18 PM

## 2019-08-06 NOTE — Progress Notes (Signed)
Initial Nutrition Assessment  DOCUMENTATION CODES:   Severe malnutrition in context of chronic illness, Underweight  INTERVENTION:  -Ensure Enlive po TID, each supplement provides 350 kcal and 20 grams of protein -Magic cup TID with meals, each supplement provides 290 kcal and 9 grams of protein -MVI   NUTRITION DIAGNOSIS:   Severe Malnutrition related to chronic illness as evidenced by energy intake < 75% for > or equal to 3 months, percent weight loss, per patient/family report  GOAL:   Patient will meet greater than or equal to 90% of their needs, Weight gain   MONITOR:   PO intake, Labs, I & O's, Supplement acceptance, Weight trends  REASON FOR ASSESSMENT:   Malnutrition Screening Tool    ASSESSMENT:  RD working remotely.   58 year old male with medical history significant of COPD and arthritis presented to ED  with SOB, neck swelling, reported 40lb wt loss in the past several months, and 2 week history of constipation. Patient seen by outpt provider yesterday who referred him CT of neck; based on imaging there were concerns for airway compromise and patient instructed to go to ER.  CT-Neck revealed lg mass of larynx consistent with carcinoma. Airway significantly narrowed to 55mm at the level of tumor. Malignant adenopathy in the neck  US-neck- bilateral neck masses likely reflecting metastatic lymphadenopathy Per surgery note today, patient against laryngectomy US guided FNA  biopsy cancelled; patient wishes for direct laryngoscopy; plans for procedure on Monday or Tuesday   Spoke with patient via phone this afternoon. Patient sounds hoarse and speaking very softly. He reports doing ok today. He recalls eating most of a hamburger steak for lunch today and tells RD that "it could have used some salt, but it was pretty good"   At home patients diet has consisted of small snacks, he reports recent swallowing difficulties and poor intake the last 3 months. Prior to that he  recalls healthy appetite and 3-4 meals/day. RD encouraged daily high protein high calorie ONS at home to meet daily needs. Patient receptive of recommendations and would like to try all Ensure flavors during admission.   Patient endorses 40+ lb wt loss over the past few months and recalls UBW 150 lbs stating "Donnald Garre been that way all my life"   Current wt - 47 kg (103.4 lb) Noted 46.6 lb (31.1%) wt loss in 3 months per pt; severe for time frame  Patient meets criteria for severe malnutrition in the context of chronic disease given severe wt loss and ongoing poor intake.   NUTRITION - FOCUSED PHYSICAL EXAM: Unable to complete at this time   Diet Order:   Diet Order            Diet regular Room service appropriate? Yes; Fluid consistency: Thin  Diet effective now              EDUCATION NEEDS:   Education needs have been addressed  Skin:  Skin Assessment: Reviewed RN Assessment  Last BM:  8/8; per pt  Height:   Ht Readings from Last 1 Encounters:  08/06/19 5\' 5"  (1.651 m)    Weight:   Wt Readings from Last 1 Encounters:  08/06/19 47 kg    Ideal Body Weight:  61.6 kg  BMI:  Body mass index is 17.24 kg/m.  Estimated Nutritional Needs:   Kcal:  1900-2100  Protein:  95-105  Fluid:  >1.9L  Lajuan Lines, RD, LDN [employer] Telephone [phone] After Hours/Weekend Pager: [phone]

## 2019-08-07 ENCOUNTER — Inpatient Hospital Stay (HOSPITAL_COMMUNITY): Payer: Medicaid Other

## 2019-08-07 ENCOUNTER — Encounter (HOSPITAL_COMMUNITY): Payer: Self-pay

## 2019-08-07 DIAGNOSIS — E43 Unspecified severe protein-calorie malnutrition: Secondary | ICD-10-CM | POA: Insufficient documentation

## 2019-08-07 LAB — CBC WITH DIFFERENTIAL/PLATELET
Abs Immature Granulocytes: 0.05 10*3/uL (ref 0.00–0.07)
Basophils Absolute: 0 10*3/uL (ref 0.0–0.1)
Basophils Relative: 0 %
Eosinophils Absolute: 0 10*3/uL (ref 0.0–0.5)
Eosinophils Relative: 0 %
HCT: 34.1 % — ABNORMAL LOW (ref 39.0–52.0)
Hemoglobin: 11 g/dL — ABNORMAL LOW (ref 13.0–17.0)
Immature Granulocytes: 1 %
Lymphocytes Relative: 11 %
Lymphs Abs: 0.9 10*3/uL (ref 0.7–4.0)
MCH: 30.6 pg (ref 26.0–34.0)
MCHC: 32.3 g/dL (ref 30.0–36.0)
MCV: 94.7 fL (ref 80.0–100.0)
Monocytes Absolute: 0.2 10*3/uL (ref 0.1–1.0)
Monocytes Relative: 2 %
Neutro Abs: 7 10*3/uL (ref 1.7–7.7)
Neutrophils Relative %: 86 %
Platelets: 257 10*3/uL (ref 150–400)
RBC: 3.6 MIL/uL — ABNORMAL LOW (ref 4.22–5.81)
RDW: 12 % (ref 11.5–15.5)
WBC: 8.1 10*3/uL (ref 4.0–10.5)
nRBC: 0 % (ref 0.0–0.2)

## 2019-08-07 LAB — BASIC METABOLIC PANEL
Anion gap: 12 (ref 5–15)
BUN: 6 mg/dL (ref 6–20)
CO2: 24 mmol/L (ref 22–32)
Calcium: 8.4 mg/dL — ABNORMAL LOW (ref 8.9–10.3)
Chloride: 101 mmol/L (ref 98–111)
Creatinine, Ser: 0.9 mg/dL (ref 0.61–1.24)
GFR calc Af Amer: >60 mL/min (ref >60)
GFR calc non Af Amer: >60 mL/min (ref >60)
Glucose, Bld: 258 mg/dL — ABNORMAL HIGH (ref 70–99)
Potassium: 4 mmol/L (ref 3.5–5.1)
Sodium: 137 mmol/L (ref 135–145)

## 2019-08-07 LAB — MAGNESIUM: Magnesium: 1.6 mg/dL — ABNORMAL LOW (ref 1.7–2.4)

## 2019-08-07 LAB — GLUCOSE, CAPILLARY
Glucose-Capillary: 157 mg/dL — ABNORMAL HIGH (ref 70–99)
Glucose-Capillary: 187 mg/dL — ABNORMAL HIGH (ref 70–99)

## 2019-08-07 MED ORDER — TRAMADOL HCL 50 MG PO TABS
25.0000 mg | ORAL_TABLET | Freq: Four times a day (QID) | ORAL | Status: DC | PRN
  Administered 2019-08-10 – 2019-08-17 (×7): 25 mg via ORAL
  Filled 2019-08-07 (×7): qty 1

## 2019-08-07 MED ORDER — RESOURCE THICKENUP CLEAR PO POWD
ORAL | Status: DC | PRN
  Filled 2019-08-07: qty 125

## 2019-08-07 MED ORDER — INSULIN ASPART 100 UNIT/ML ~~LOC~~ SOLN
0.0000 [IU] | Freq: Three times a day (TID) | SUBCUTANEOUS | Status: DC
  Administered 2019-08-07: 2 [IU] via SUBCUTANEOUS
  Administered 2019-08-08 – 2019-08-10 (×3): 1 [IU] via SUBCUTANEOUS
  Administered 2019-08-11 – 2019-08-13 (×2): 2 [IU] via SUBCUTANEOUS
  Administered 2019-08-13: 1 [IU] via SUBCUTANEOUS
  Administered 2019-08-14 – 2019-08-15 (×2): 2 [IU] via SUBCUTANEOUS
  Administered 2019-08-16: 1 [IU] via SUBCUTANEOUS

## 2019-08-07 MED ORDER — IPRATROPIUM-ALBUTEROL 0.5-2.5 (3) MG/3ML IN SOLN
3.0000 mL | Freq: Two times a day (BID) | RESPIRATORY_TRACT | Status: DC
  Administered 2019-08-07 – 2019-08-10 (×6): 3 mL via RESPIRATORY_TRACT
  Filled 2019-08-07 (×6): qty 3

## 2019-08-07 MED ORDER — MAGNESIUM SULFATE 2 GM/50ML IV SOLN
2.0000 g | Freq: Once | INTRAVENOUS | Status: AC
  Administered 2019-08-07: 2 g via INTRAVENOUS
  Filled 2019-08-07: qty 50

## 2019-08-07 NOTE — Plan of Care (Signed)
Poc progressing.  
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
## 2019-08-07 NOTE — Progress Notes (Signed)
Subjective/Chief Complaint: Doing well   Objective: Vital signs in last 24 hours: Temp:  [97.9 F (36.6 C)-98.9 F (37.2 C)] 97.9 F (36.6 C) (08/09 0500) Pulse Rate:  [59-79] 59 (08/09 0500) Resp:  [17-21] 19 (08/09 0500) BP: (115-148)/(76-91) 148/91 (08/09 0500) SpO2:  [97 %-100 %] 97 % (08/09 0753) Last BM Date: 08/06/19  Intake/Output from previous day: 08/08 0701 - 08/09 0700 In: 1860.8 [P.O.:780; I.V.:1080.8] Out: 400 [Urine:400] Intake/Output this shift: No intake/output data recorded.  no change  Lab Results:  Recent Labs    08/06/19 1522 08/07/19 0259  WBC 7.0 8.1  HGB 12.0* 11.0*  HCT 36.3* 34.1*  PLT 262 257   BMET Recent Labs    08/06/19 1522 08/07/19 0259  NA 138 137  K 4.0 4.0  CL 105 101  CO2 23 24  GLUCOSE 170* 258*  BUN 6 6  CREATININE 0.80 0.90  CALCIUM 8.4* 8.4*   PT/INR Recent Labs    08/06/19 0248  LABPROT 13.9  INR 1.1   ABG No results for input(s): PHART, HCO3 in the last 72 hours.  Invalid input(s): PCO2, PO2  Studies/Results: Ct Soft Tissue Neck W Contrast  Result Date: 08/05/2019 CLINICAL DATA:  Neck masses. Difficulty breathing. Hoarseness. Abnormal ultrasound today. EXAM: CT NECK WITH CONTRAST TECHNIQUE: Multidetector CT imaging of the neck was performed using the standard protocol following the bolus administration of intravenous contrast. CONTRAST:  47mL OMNIPAQUE IOHEXOL 300 MG/ML  SOLN COMPARISON:  Ultrasound neck today FINDINGS: Pharynx and larynx: Large mass involving the larynx. The mass is bilateral but larger on the right than the left. Overall, the mass measures approximately 38 x 36 mm. 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 The mass is narrowing the airway which is 8 mm in diameter. Possible subglottic extension especially on the right. Tumor extends through the thyroid cartilage on the right and possibly on the left. Mass extends superiorly into the aryepiglottic fold and  piriform sinus on the right Tongue and tonsils normal.  Epiglottis normal. Salivary glands: No inflammation, mass, or stone. Thyroid: No thyroid mass. 3 mm left thyroid nodule. Normal thyroid size Lymph nodes: Malignant adenopathy on the right. Posterior right upper lymph node 8.6 mm with irregular enhancement compatible with tumor. 9.5 mm adjacent lymph node posteriorly. Right level 2 lymph node with malignant enhancement pattern measuring 19 x 31 mm. Right posterior lymph node 15 mm in the mid neck. Subcentimeter enhancing lesions in the level 4 station compatible with metastatic disease on the right. Left level 4 lymph node 8 mm likely metastatic. Vascular: Right jugular vein is compressed due to enlarged lymph node on the right and appears severely stenotic or occluded. Left jugular vein enhances normally. Both carotid arteries are patent. Limited intracranial: Negative Visualized orbits: Not imaged Mastoids and visualized paranasal sinuses: Mucosal edema and air-fluid level left maxillary sinus. Mild mucosal edema right maxillary sinus. Skeleton: Cervical spondylosis.  No skeletal lesions. 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 Other: None IMPRESSION: 1. 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 The mass originated on the right and crosses the midline anteriorly into the left larynx. The tumor extends through the thyroid cartilage on the right and possibly on the left. Airway is significantly narrowed to 8 mm at the level of the tumor. 2. Malignant adenopathy in the neck. 8 mm probable malignant node in the left level 4 lymph node chain. 3. Compression right jugular vein due to enlarged lymph  node on the right. 4. These results were called by telephone at the time of interpretation on 08/05/2019 at 4:50 pm to [name] NP , who verbally acknowledged these results. I recommend emergency evaluation of the airway Electronically Signed   By: Franchot Gallo M.D.   On:  08/05/2019 16:54   US Soft Tissue Head And Neck  Result Date: 08/05/2019 CLINICAL DATA:  Bilateral neck swelling/mass/lump. EXAM: ULTRASOUND OF HEAD/NECK SOFT TISSUES TECHNIQUE: Ultrasound examination of the head and neck soft tissues was performed in the area of clinical concern. COMPARISON:  None. FINDINGS: Anterior neck masses are present bilaterally and demonstrate heterogeneous internal echotexture with possible small areas of cystic change. More laterally located right neck masses measure 2.4 x 3.4 x 1.8 cm in level II and 1.5 x 2.6 x 1.1 cm in level IV. A more medially located right neck mass superior to the thyroid measures 3.2 x 4.9 x 2.7 cm. A left neck mass in level III measures 0.8 x 1.6 x 0.8 cm. IMPRESSION: Bilateral neck masses likely reflecting metastatic lymphadenopathy. Contrast-enhanced neck CT is recommended for further evaluation. Electronically Signed   By: Logan Bores M.D.   On: 08/05/2019 15:20   Dg Chest Port 1 View  Result Date: 08/05/2019 CLINICAL DATA:  Shortness of breath. EXAM: PORTABLE CHEST 1 VIEW COMPARISON:  June 29, 2019 FINDINGS: No pneumothorax. Scarring remains in the right apex. Hyperinflation of the lungs suggests COPD or emphysema. The cardiomediastinal silhouette is unremarkable. No other acute abnormalities. IMPRESSION: Chronic scarring in the right apex. Hyperinflation of the lungs suggesting COPD or emphysema. Electronically Signed   By: Dorise Bullion III M.D   On: 08/05/2019 19:30    Anti-infectives: Anti-infectives (From admission, onward)   None      Assessment/Plan: s/p * No surgery found * I discussed the tracheotomy and direct laryngoscopy with him.  He was informed the risk and benefits as well as options of the procedure.  The tracheotomy will be performed under local and he understands how that will be performed.  All his questions were answered and consent was obtained.  The procedure is scheduled for 730 tomorrow morning.  LOS: 2 days     Perry Waller 08/07/2019

## 2019-08-07 NOTE — Anesthesia Preprocedure Evaluation (Addendum)
Anesthesia Evaluation  Patient identified by MRN, date of birth, ID band Patient awake    Reviewed: Allergy & Precautions, NPO status , Patient's Chart, lab work & pertinent test results  History of Anesthesia Complications Negative for: history of anesthetic complications  Airway Mallampati: I  TM Distance: >3 FB     Dental no notable dental hx. (+) Edentulous Upper, Missing   Pulmonary COPD,  COPD inhaler, Current Smoker,    Pulmonary exam normal        Cardiovascular negative cardio ROS Normal cardiovascular exam     Neuro/Psych negative neurological ROS     GI/Hepatic negative GI ROS, Neg liver ROS,   Endo/Other  negative endocrine ROS  Renal/GU negative Renal ROS     Musculoskeletal negative musculoskeletal ROS (+)   Abdominal   Peds  Hematology negative hematology ROS (+)   Anesthesia Other Findings Day of surgery medications reviewed with the patient.  Reproductive/Obstetrics                            Anesthesia Physical Anesthesia Plan  ASA: IV  Anesthesia Plan: General   Post-op Pain Management:    Induction: Intravenous  PONV Risk Score and Plan: 1 and 2 and Ondansetron and Dexamethasone  Airway Management Planned: Tracheostomy  Additional Equipment:   Intra-op Plan:   Post-operative Plan:   Informed Consent: I have reviewed the patients History and Physical, chart, labs and discussed the procedure including the risks, benefits and alternatives for the proposed anesthesia with the patient or authorized representative who has indicated his/her understanding and acceptance.     Dental advisory given  Plan Discussed with: CRNA and Anesthesiologist  Anesthesia Plan Comments: (Tracheostomy under local, then GA for DL once airway is secure.)      Anesthesia Quick Evaluation

## 2019-08-07 NOTE — Progress Notes (Signed)
Modified Barium Swallow Progress Note  Patient Details  Name: Perry Waller MRN: [id number] Date of Birth: 1961-05-25  Today's Date: 08/07/2019  Modified Barium Swallow completed.  Full report located under Chart Review in the Imaging Section.  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 He does not want any surgical intervention at this time. He is being scheduled for a tracheostomy, laryngeal biopsies and following up with radiation. MBS was completed due to signs/symptoms of dysphagia at the bedside. Patient had prolonged mastication (edentulous) with soft solids and piecemeal swallow, but had mild oral residue. He had delayed swallow reflex with all consistencies to the valleculae,  reduced laryngeal elevation, laryngeal anterior movement and incomplete laryngeal vestibule closure resulting in penetration to the cords. Pt was sensate and had a delayed cough that appeared to be successful in ejecting aspirate. Thin and nectar was given in only tsp size bolus. Likely his ability to protect airway would be reduced with larger bolus sizes. he tolerated Honey thickened liquids with no penetration. Recommend Dys 3, honey thickened liquids at this time. Medication to be given crushed in puree. ST will need to follow patient closely as he gets tracheostomy and begins radiation to make best recommendations for safest diet.     Swallow Evaluation Recommendations       SLP Diet Recommendations: Dysphagia 3 (Mech soft) solids;Honey thick liquids   Liquid Administration via: Cup;No straw   Medication Administration: Crushed with puree   Supervision: Patient able to self feed   Compensations: Slow rate;Small sips/bites   Postural Changes: Remain semi-upright after after feeds/meals (Comment)   Oral Care Recommendations: Oral care BID   Other Recommendations: Order thickener from [place], [place],  CCC-SLP 08/07/2019 1:21 PM

## 2019-08-08 ENCOUNTER — Inpatient Hospital Stay (HOSPITAL_COMMUNITY): Payer: Medicaid Other

## 2019-08-08 ENCOUNTER — Ambulatory Visit
Admission: RE | Admit: 2019-08-08 | Discharge: 2019-08-08 | Disposition: A | Discharge status: Home / Self Care | Payer: Medicaid Other | Source: Ambulatory Visit | Attending: Radiation Oncology | Admitting: Radiation Oncology

## 2019-08-08 ENCOUNTER — Encounter (HOSPITAL_COMMUNITY): Payer: Self-pay | Admitting: Certified Registered"

## 2019-08-08 ENCOUNTER — Encounter (HOSPITAL_COMMUNITY)
Admission: EM | Disposition: A | Discharge status: Home / Self Care | Payer: Self-pay | Source: Home / Self Care | Attending: Internal Medicine

## 2019-08-08 ENCOUNTER — Inpatient Hospital Stay (HOSPITAL_COMMUNITY): Payer: Medicaid Other | Admitting: Anesthesiology

## 2019-08-08 DIAGNOSIS — C321 Malignant neoplasm of supraglottis: Secondary | ICD-10-CM | POA: Insufficient documentation

## 2019-08-08 DIAGNOSIS — Z510 Encounter for antineoplastic radiation therapy: Secondary | ICD-10-CM | POA: Insufficient documentation

## 2019-08-08 HISTORY — PX: DIRECT LARYNGOSCOPY: SHX5326

## 2019-08-08 HISTORY — PX: TRACHEOSTOMY TUBE PLACEMENT: SHX814

## 2019-08-08 LAB — GLUCOSE, CAPILLARY
Glucose-Capillary: 128 mg/dL — ABNORMAL HIGH (ref 70–99)
Glucose-Capillary: 96 mg/dL (ref 70–99)
Glucose-Capillary: 158 mg/dL — ABNORMAL HIGH (ref 70–99)

## 2019-08-08 LAB — CBC WITH DIFFERENTIAL/PLATELET
Abs Immature Granulocytes: 0.03 10*3/uL (ref 0.00–0.07)
Basophils Absolute: 0 10*3/uL (ref 0.0–0.1)
Basophils Relative: 0 %
Eosinophils Absolute: 0 10*3/uL (ref 0.0–0.5)
Eosinophils Relative: 0 %
HCT: 34.2 % — ABNORMAL LOW (ref 39.0–52.0)
Hemoglobin: 11 g/dL — ABNORMAL LOW (ref 13.0–17.0)
Immature Granulocytes: 0 %
Lymphocytes Relative: 13 %
Lymphs Abs: 1 10*3/uL (ref 0.7–4.0)
MCH: 30.3 pg (ref 26.0–34.0)
MCHC: 32.2 g/dL (ref 30.0–36.0)
MCV: 94.2 fL (ref 80.0–100.0)
Monocytes Absolute: 0.2 10*3/uL (ref 0.1–1.0)
Monocytes Relative: 3 %
Neutro Abs: 6.6 10*3/uL (ref 1.7–7.7)
Neutrophils Relative %: 84 %
Platelets: 237 10*3/uL (ref 150–400)
RBC: 3.63 MIL/uL — ABNORMAL LOW (ref 4.22–5.81)
RDW: 11.9 % (ref 11.5–15.5)
WBC: 7.8 10*3/uL (ref 4.0–10.5)
nRBC: 0 % (ref 0.0–0.2)

## 2019-08-08 LAB — BASIC METABOLIC PANEL
Anion gap: 8 (ref 5–15)
BUN: 7 mg/dL (ref 6–20)
CO2: 27 mmol/L (ref 22–32)
Calcium: 8.4 mg/dL — ABNORMAL LOW (ref 8.9–10.3)
Chloride: 104 mmol/L (ref 98–111)
Creatinine, Ser: 0.65 mg/dL (ref 0.61–1.24)
GFR calc Af Amer: >60 mL/min (ref >60)
GFR calc non Af Amer: >60 mL/min (ref >60)
Glucose, Bld: 166 mg/dL — ABNORMAL HIGH (ref 70–99)
Potassium: 4.2 mmol/L (ref 3.5–5.1)
Sodium: 139 mmol/L (ref 135–145)

## 2019-08-08 LAB — MAGNESIUM: Magnesium: 1.7 mg/dL (ref 1.7–2.4)

## 2019-08-08 LAB — SURGICAL PCR SCREEN
MRSA, PCR: NEGATIVE
Staphylococcus aureus: NEGATIVE

## 2019-08-08 SURGERY — CREATION, TRACHEOSTOMY
Anesthesia: General | Site: Throat

## 2019-08-08 MED ORDER — CELECOXIB 200 MG PO CAPS
400.0000 mg | ORAL_CAPSULE | Freq: Once | ORAL | Status: AC
  Administered 2019-08-08: 400 mg via ORAL
  Filled 2019-08-08: qty 2
  Filled 2019-08-08: qty 1

## 2019-08-08 MED ORDER — LIDOCAINE-EPINEPHRINE 1 %-1:100000 IJ SOLN
INTRAMUSCULAR | Status: AC
  Filled 2019-08-08: qty 1

## 2019-08-08 MED ORDER — METHYLPREDNISOLONE SODIUM SUCC 125 MG IJ SOLR
40.0000 mg | Freq: Two times a day (BID) | INTRAMUSCULAR | Status: DC
  Administered 2019-08-09 – 2019-08-10 (×3): 40 mg via INTRAVENOUS
  Filled 2019-08-08 (×3): qty 2

## 2019-08-08 MED ORDER — SUGAMMADEX SODIUM 200 MG/2ML IV SOLN
INTRAVENOUS | Status: DC | PRN
  Administered 2019-08-08: 150 mg via INTRAVENOUS

## 2019-08-08 MED ORDER — LIDOCAINE 2% (20 MG/ML) 5 ML SYRINGE
INTRAMUSCULAR | Status: DC | PRN
  Administered 2019-08-08: 20 mg via INTRAVENOUS
  Administered 2019-08-08: 80 mg via INTRAVENOUS

## 2019-08-08 MED ORDER — SUCCINYLCHOLINE CHLORIDE 200 MG/10ML IV SOSY
PREFILLED_SYRINGE | INTRAVENOUS | Status: AC
  Filled 2019-08-08: qty 10

## 2019-08-08 MED ORDER — FENTANYL CITRATE (PF) 100 MCG/2ML IJ SOLN: 25.0000 ug | INTRAMUSCULAR | Status: DC | PRN

## 2019-08-08 MED ORDER — HYDRALAZINE HCL 20 MG/ML IJ SOLN
10.0000 mg | Freq: Four times a day (QID) | INTRAMUSCULAR | Status: DC | PRN
  Administered 2019-08-08 – 2019-08-12 (×7): 10 mg via INTRAVENOUS
  Filled 2019-08-08 (×7): qty 1

## 2019-08-08 MED ORDER — OXYMETAZOLINE HCL 0.05 % NA SOLN
NASAL | Status: DC | PRN
  Administered 2019-08-08: 1 via TOPICAL

## 2019-08-08 MED ORDER — DEXAMETHASONE SODIUM PHOSPHATE 10 MG/ML IJ SOLN
INTRAMUSCULAR | Status: DC | PRN
  Administered 2019-08-08: 10 mg via INTRAVENOUS

## 2019-08-08 MED ORDER — PHENYLEPHRINE 40 MCG/ML (10ML) SYRINGE FOR IV PUSH (FOR BLOOD PRESSURE SUPPORT)
PREFILLED_SYRINGE | INTRAVENOUS | Status: AC
  Filled 2019-08-08: qty 10

## 2019-08-08 MED ORDER — GLYCOPYRROLATE 0.2 MG/ML IJ SOLN
INTRAMUSCULAR | Status: DC | PRN
  Administered 2019-08-08: 0.1 mg via INTRAVENOUS

## 2019-08-08 MED ORDER — ONDANSETRON HCL 4 MG/2ML IJ SOLN
INTRAMUSCULAR | Status: AC
  Filled 2019-08-08: qty 2

## 2019-08-08 MED ORDER — IOHEXOL 300 MG/ML  SOLN
75.0000 mL | Freq: Once | INTRAMUSCULAR | Status: AC | PRN
  Administered 2019-08-08: 75 mL via INTRAVENOUS

## 2019-08-08 MED ORDER — LIDOCAINE-EPINEPHRINE 1 %-1:100000 IJ SOLN
INTRAMUSCULAR | Status: DC | PRN
  Administered 2019-08-08: 8 mL

## 2019-08-08 MED ORDER — ROCURONIUM BROMIDE 50 MG/5ML IV SOSY
PREFILLED_SYRINGE | INTRAVENOUS | Status: DC | PRN
  Administered 2019-08-08: 50 mg via INTRAVENOUS

## 2019-08-08 MED ORDER — PROPOFOL 10 MG/ML IV BOLUS
INTRAVENOUS | Status: AC
  Filled 2019-08-08: qty 20

## 2019-08-08 MED ORDER — ONDANSETRON HCL 4 MG/2ML IJ SOLN
INTRAMUSCULAR | Status: DC | PRN
  Administered 2019-08-08: 4 mg via INTRAVENOUS

## 2019-08-08 MED ORDER — ACETAMINOPHEN 500 MG PO TABS
1000.0000 mg | ORAL_TABLET | Freq: Once | ORAL | Status: AC
  Administered 2019-08-08: 1000 mg via ORAL
  Filled 2019-08-08: qty 2

## 2019-08-08 MED ORDER — PROMETHAZINE HCL 25 MG/ML IJ SOLN: 6.2500 mg | INTRAMUSCULAR | Status: DC | PRN

## 2019-08-08 MED ORDER — CHLORHEXIDINE GLUCONATE CLOTH 2 % EX PADS
6.0000 | MEDICATED_PAD | Freq: Every day | CUTANEOUS | Status: DC
  Administered 2019-08-08 – 2019-08-13 (×6): 6 via TOPICAL

## 2019-08-08 MED ORDER — 0.9 % SODIUM CHLORIDE (POUR BTL) OPTIME
TOPICAL | Status: DC | PRN
  Administered 2019-08-08: 1000 mL

## 2019-08-08 MED ORDER — MIDAZOLAM HCL 5 MG/5ML IJ SOLN
INTRAMUSCULAR | Status: DC | PRN
  Administered 2019-08-08: 1 mg via INTRAVENOUS

## 2019-08-08 MED ORDER — GLYCOPYRROLATE PF 0.2 MG/ML IJ SOSY
PREFILLED_SYRINGE | INTRAMUSCULAR | Status: AC
  Filled 2019-08-08: qty 1

## 2019-08-08 MED ORDER — MIDAZOLAM HCL 2 MG/2ML IJ SOLN
INTRAMUSCULAR | Status: AC
  Filled 2019-08-08: qty 2

## 2019-08-08 MED ORDER — FENTANYL CITRATE (PF) 100 MCG/2ML IJ SOLN
INTRAMUSCULAR | Status: DC | PRN
  Administered 2019-08-08: 25 ug via INTRAVENOUS
  Administered 2019-08-08: 50 ug via INTRAVENOUS

## 2019-08-08 MED ORDER — ROCURONIUM BROMIDE 10 MG/ML (PF) SYRINGE
PREFILLED_SYRINGE | INTRAVENOUS | Status: AC
  Filled 2019-08-08: qty 10

## 2019-08-08 MED ORDER — DEXAMETHASONE SODIUM PHOSPHATE 10 MG/ML IJ SOLN
INTRAMUSCULAR | Status: AC
  Filled 2019-08-08: qty 1

## 2019-08-08 MED ORDER — HYDRALAZINE HCL 20 MG/ML IJ SOLN
INTRAMUSCULAR | Status: AC
  Filled 2019-08-08: qty 1

## 2019-08-08 MED ORDER — DEXMEDETOMIDINE HCL 200 MCG/2ML IV SOLN
INTRAVENOUS | Status: DC | PRN
  Administered 2019-08-08: 12 ug via INTRAVENOUS

## 2019-08-08 MED ORDER — PROPOFOL 10 MG/ML IV BOLUS
INTRAVENOUS | Status: DC | PRN
  Administered 2019-08-08: 100 mg via INTRAVENOUS

## 2019-08-08 MED ORDER — LACTATED RINGERS IV SOLN
INTRAVENOUS | Status: DC | PRN
  Administered 2019-08-08: 07:00:00 via INTRAVENOUS

## 2019-08-08 MED ORDER — HYDROCOD POLST-CPM POLST ER 10-8 MG/5ML PO SUER
5.0000 mL | Freq: Once | ORAL | Status: AC
  Administered 2019-08-08: 5 mL via ORAL
  Filled 2019-08-08: qty 5

## 2019-08-08 MED ORDER — HYDRALAZINE HCL 20 MG/ML IJ SOLN
5.0000 mg | INTRAMUSCULAR | Status: AC
  Administered 2019-08-08: 5 mg via INTRAVENOUS

## 2019-08-08 MED ORDER — FENTANYL CITRATE (PF) 250 MCG/5ML IJ SOLN
INTRAMUSCULAR | Status: AC
  Filled 2019-08-08: qty 5

## 2019-08-08 MED ORDER — OXYMETAZOLINE HCL 0.05 % NA SOLN
NASAL | Status: AC
  Filled 2019-08-08: qty 30

## 2019-08-08 MED ORDER — LIDOCAINE 2% (20 MG/ML) 5 ML SYRINGE
INTRAMUSCULAR | Status: AC
  Filled 2019-08-08: qty 5

## 2019-08-08 MED ORDER — MORPHINE SULFATE (PF) 2 MG/ML IV SOLN
1.0000 mg | INTRAVENOUS | Status: DC | PRN
  Administered 2019-08-08 – 2019-08-09 (×2): 1 mg via INTRAVENOUS
  Filled 2019-08-08 (×2): qty 1

## 2019-08-08 SURGICAL SUPPLY — 46 items
BLADE CLIPPER SURG (BLADE) IMPLANT
BLADE SURG 15 STRL LF DISP TIS (BLADE) IMPLANT
BLADE SURG 15 STRL SS (BLADE)
CANISTER SUCT 3000ML PPV (MISCELLANEOUS) ×4 IMPLANT
CLEANER TIP ELECTROSURG 2X2 (MISCELLANEOUS) ×4 IMPLANT
CLOSURE WOUND 1/2 X4 (GAUZE/BANDAGES/DRESSINGS)
COVER BACK TABLE 60X90IN (DRAPES) ×4 IMPLANT
COVER MAYO STAND STRL (DRAPES) ×4 IMPLANT
COVER SURGICAL LIGHT HANDLE (MISCELLANEOUS) ×4 IMPLANT
COVER WAND RF STERILE (DRAPES) ×4 IMPLANT
DRAPE HALF SHEET 40X57 (DRAPES) ×4 IMPLANT
DRESSING ALLEVYN 2X4 GNTL LT (GAUZE/BANDAGES/DRESSINGS) ×4 IMPLANT
ELECT COATED BLADE 2.86 ST (ELECTRODE) ×4 IMPLANT
ELECT REM PT RETURN 9FT ADLT (ELECTROSURGICAL) ×4
ELECTRODE REM PT RTRN 9FT ADLT (ELECTROSURGICAL) ×2 IMPLANT
GAUZE 4X4 16PLY RFD (DISPOSABLE) ×4 IMPLANT
GLOVE ECLIPSE 7.5 STRL STRAW (GLOVE) ×8 IMPLANT
GOWN STRL REUS W/ TWL LRG LVL3 (GOWN DISPOSABLE) ×4 IMPLANT
GOWN STRL REUS W/TWL LRG LVL3 (GOWN DISPOSABLE) ×4
GUARD TEETH (MISCELLANEOUS) IMPLANT
KIT BASIN OR (CUSTOM PROCEDURE TRAY) ×4 IMPLANT
KIT TURNOVER KIT B (KITS) ×4 IMPLANT
NEEDLE HYPO 25GX1X1/2 BEV (NEEDLE) ×4 IMPLANT
NS IRRIG 1000ML POUR BTL (IV SOLUTION) ×4 IMPLANT
PAD ARMBOARD 7.5X6 YLW CONV (MISCELLANEOUS) ×8 IMPLANT
PATTIES SURGICAL .5 X3 (DISPOSABLE) IMPLANT
PENCIL FOOT CONTROL (ELECTRODE) ×4 IMPLANT
POSITIONER HEAD DONUT 9IN (MISCELLANEOUS) IMPLANT
SOLUTION ANTI FOG 6CC (MISCELLANEOUS) IMPLANT
SPECIMEN JAR SMALL (MISCELLANEOUS) IMPLANT
SPLINT NASAL DOYLE BI-VL (GAUZE/BANDAGES/DRESSINGS) ×4 IMPLANT
SPONGE DRAIN TRACH 4X4 STRL 2S (GAUZE/BANDAGES/DRESSINGS) IMPLANT
STRIP CLOSURE SKIN 1/2X4 (GAUZE/BANDAGES/DRESSINGS) IMPLANT
SURGILUBE 2OZ TUBE FLIPTOP (MISCELLANEOUS) IMPLANT
SUT CHROMIC GUT 2 0 PS 2 27 (SUTURE) ×4 IMPLANT
SUT ETHILON 3 0 PS 1 (SUTURE) ×4 IMPLANT
SUT SILK 3 0 SH 30 (SUTURE) ×4 IMPLANT
SUT SILK 3 0 TIES 17X18 (SUTURE) ×2
SUT SILK 3-0 18XBRD TIE BLK (SUTURE) ×2 IMPLANT
SYR CONTROL 10ML LL (SYRINGE) ×4 IMPLANT
TOWEL GREEN STERILE FF (TOWEL DISPOSABLE) ×8 IMPLANT
TRAY ENT MC OR (CUSTOM PROCEDURE TRAY) ×4 IMPLANT
TUBE CONNECTING 12'X1/4 (SUCTIONS) ×1
TUBE CONNECTING 12X1/4 (SUCTIONS) ×3 IMPLANT
TUBE TRACH SHILEY  6 DIST  CUF (TUBING) ×4 IMPLANT
WATER STERILE IRR 1000ML POUR (IV SOLUTION) ×4 IMPLANT

## 2019-08-08 NOTE — Anesthesia Procedure Notes (Signed)
Date/Time: 08/08/2019 7:44 AM Performed by: Orlie Dakin, CRNA Pre-anesthesia Checklist: Patient identified, Emergency Drugs available, Suction available and Patient being monitored Patient Re-evaluated:Patient Re-evaluated prior to induction Oxygen Delivery Method: Circle system utilized Preoxygenation: Pre-oxygenation with 100% oxygen Induction Type: IV induction Number of attempts: 1 Airway Equipment and Method: Tracheostomy Placement Confirmation: positive ETCO2 Comments: Trach performed by Dr Janace Hoard, +EtCO2 present, then induction.

## 2019-08-08 NOTE — Anesthesia Postprocedure Evaluation (Signed)
Anesthesia Post Note  Patient: Perry Waller  Procedure(s) Performed: TRACHEOSTOMY (N/A Neck) DIRECT LARYNGOSCOPY (N/A Throat)     Patient location during evaluation: PACU Anesthesia Type: General Level of consciousness: sedated Pain management: pain level controlled Vital Signs Assessment: post-procedure vital signs reviewed and stable Respiratory status: spontaneous breathing and respiratory function stable Cardiovascular status: stable Postop Assessment: no apparent nausea or vomiting Anesthetic complications: no    Last Vitals:  Vitals:   08/08/19 0845 08/08/19 0847  BP:  (!) 159/94  Pulse: 68 69  Resp: 14 (!) 23  Temp:  36.4 C  SpO2: 99% 99%    Last Pain:  Vitals:   08/08/19 0847  TempSrc:   PainSc: 0-No pain                 Jaeline Whobrey DANIEL

## 2019-08-08 NOTE — Progress Notes (Signed)
Pt left to OR via bed with OR staff. Report called to [facility]. Vitals stable . CCMD called and notified.

## 2019-08-08 NOTE — Anesthesia Procedure Notes (Signed)
Procedure Name: MAC Date/Time: 08/08/2019 7:40 AM Performed by: Orlie Dakin, CRNA Pre-anesthesia Checklist: Patient identified, Emergency Drugs available, Suction available and Patient being monitored Patient Re-evaluated:Patient Re-evaluated prior to induction Oxygen Delivery Method: Nasal cannula Preoxygenation: Pre-oxygenation with 100% oxygen Induction Type: IV induction

## 2019-08-08 NOTE — Progress Notes (Signed)
Talked to Anesthesia about pre up meds, she said they give it in holding. Made pt aware that he will be going to OR in an hour. Will continue to monitor.

## 2019-08-08 NOTE — Progress Notes (Addendum)
PROGRESS NOTE  Perry Waller ERX:[id number] DOB: Jan 28, 1961 DOA: 08/05/2019 PCP: [name], FNP  HPI/Recap of past 24 hours: HPI from Dr Davis Gourd is a 58 y.o. male with medical history significant of COPD, tobacco abuse, arthritis, who presents with shortness breath, neck swelling. Patient states that he has been having cough, shortness of breath, wheezing for more than 1 week.  No chest pain.  No fever or chills.  He notices neck swelling and has difficult swallowing.  He states that he has lost more than 40 pounds in the past several months. Pt was seen by outpatient provider this morning who referred him for CT neck.Based on imaging findings there was concern for airway compromise, and patient was instructed to go to the ER. In the ED, oxygen saturation 97% on room air, chest x-ray showed COPD with emphysema. Patient admitted to stepdown as inpatient. Dr. Janace Hoard of ENT was consulted.    Today, patient reports persistent cough, denies any new complaints.  Patient denies any chest pain, worsening shortness of breath, abdominal pain, nausea/vomiting/diarrhea, fever/chills.  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 SLP on board, will follow Continue IV steroid, tapering down Monitor closely in the SDU for signs of respiratory compromise  Acute COPD exacerbation Likely worsened by airway narrowing secondary to neck mass Afebrile, with no  leukocytosis COVID-19 negative, respiratory viral panel negative Chest x-ray showed hyperinflation of the lungs, suggesting COPD or emphysema Duo nebs, IV Solu-Medrol, PRN inhalers, Mucinex Incentive spirometry, supplemental oxygen PRN  Steroid-induced hyperglycemia SSI, Accu-Cheks Continue to taper of steroids  Hypokalemia/hypomagnesemia  Replace PRN  Tobacco abuse: Advised to quit Nicotine patch         Malnutrition Type:  Nutrition Problem: Severe Malnutrition Etiology: chronic illness   Malnutrition Characteristics:  Signs/Symptoms: energy intake < 75% for > or equal to 3 months, percent weight loss, per patient/family report Percent weight loss: 31.1 %(46.6 lbs x 3 months)   Nutrition Interventions:  Interventions: Ensure Enlive (each supplement provides 350kcal and 20 grams of protein), MVI, Magic cup    Estimated body mass index is 17.24 kg/m as calculated from the following:   Height as of this encounter: 5\' 5"  (1.651 m).   Weight as of this encounter: 47 kg.     Code Status: Full  Family Communication: None at bedside  Disposition Plan: To be determined   Consultants:  ENT  Radiation oncology  Procedures:  None  Antimicrobials:  None  DVT prophylaxis: Lovenox   Objective: Vitals:   08/08/19 1415 08/08/19 1430 08/08/19 1445 08/08/19 1530  BP: (!) 183/96 (!) 171/103 (!) 180/114 (!) 178/91  Pulse: (!) 58 (!) 59 (!) 54 67  Resp: 13 20 18 17   Temp:      TempSrc:      SpO2: 100% 100% 99% 94%  Weight:      Height:        Intake/Output Summary (Last 24 hours) at 08/08/2019 1537 Last data filed at 08/08/2019  1400 Gross per 24 hour  Intake 2606.85 ml  Output 1450 ml  Net 1156.85 ml   Filed Weights   08/06/19 0120  Weight: 47 kg    Exam:  General: NAD, trach site noted  Cardiovascular: S1, S2 present  Respiratory: CTAB  Abdomen: Soft, nontender, nondistended, bowel sounds present  Musculoskeletal: No bilateral  pedal edema noted  Skin: Normal  Psychiatry: Normal mood   Data Reviewed: CBC: Recent Labs  Lab 08/05/19 1821 08/06/19 1522 08/07/19 0259 08/08/19 0325  WBC 4.9 7.0 8.1 7.8  NEUTROABS 3.0 5.7 7.0 6.6  HGB 13.7 12.0* 11.0* 11.0*  HCT 42.7 36.3* 34.1* 34.2*  MCV 95.5 93.1 94.7 94.2  PLT 257 262 257 867   Basic Metabolic Panel: Recent Labs  Lab 08/05/19 1821 08/06/19 0248 08/06/19 1522 08/07/19 0259 08/08/19 0325  NA 136  --  138 137 139  K 3.4*  --  4.0 4.0 4.2  CL 99  --  105 101 104  CO2 22  --  23 24 27   GLUCOSE 82  --  170* 258* 166*  BUN 5*  --  6 6 7   CREATININE 0.71  --  0.80 0.90 0.65  CALCIUM 8.7*  --  8.4* 8.4* 8.4*  MG  --  1.6*  --  1.6* 1.7   GFR: Estimated Creatinine Clearance: 66.9 mL/min (by C-G formula based on SCr of 0.65 mg/dL). Liver Function Tests: Recent Labs  Lab 08/05/19 1821  AST 25  ALT 13  ALKPHOS 66  BILITOT 0.7  PROT 6.8  ALBUMIN 3.1*   No results for input(s): LIPASE, AMYLASE in the last 168 hours. No results for input(s): AMMONIA in the last 168 hours. Coagulation Profile: Recent Labs  Lab 08/06/19 0248  INR 1.1   Cardiac Enzymes: No results for input(s): CKTOTAL, CKMB, CKMBINDEX, TROPONINI in the last 168 hours. BNP (last 3 results) No results for input(s): PROBNP in the last 8760 hours. HbA1C: No results for input(s): HGBA1C in the last 72 hours. CBG: Recent Labs  Lab 08/07/19 1524 08/07/19 2129 08/08/19 0625 08/08/19 0821  GLUCAP 157* 187* 128* 96   Lipid Profile: No results for input(s): CHOL, HDL, LDLCALC, TRIG, CHOLHDL, LDLDIRECT in the last 72 hours. Thyroid Function Tests: Recent Labs    08/05/19 2000  TSH 0.924   Anemia Panel: No results for input(s): VITAMINB12, FOLATE, FERRITIN, TIBC, IRON, RETICCTPCT in the last 72 hours. Urine analysis:    Component Value Date/Time   COLORURINE YELLOW 10/17/2011 2052   APPEARANCEUR CLEAR 10/17/2011 2052   LABSPEC 1.024 10/17/2011 2052   PHURINE 6.5  10/17/2011 2052   GLUCOSEU NEGATIVE 10/17/2011 2052   HGBUR TRACE (A) 10/17/2011 2052   BILIRUBINUR NEGATIVE 10/17/2011 2052   KETONESUR NEGATIVE 10/17/2011 2052   PROTEINUR NEGATIVE 10/17/2011 2052   UROBILINOGEN 0.2 10/17/2011 2052   NITRITE NEGATIVE 10/17/2011 2052   LEUKOCYTESUR NEGATIVE 10/17/2011 2052   Sepsis Labs: @LABRCNTIP (procalcitonin:4,lacticidven:4)  ) Recent Results (from the past 240 hour(s))  SARS Coronavirus 2 [facility] order, Performed in [facility] hospital lab) Nasopharyngeal Nasopharyngeal Swab     Status: None   Collection Time: 08/05/19  7:35 PM   Specimen: Nasopharyngeal Swab  Result Value Ref Range Status   SARS Coronavirus 2 NEGATIVE NEGATIVE Final    Comment: (NOTE) If result is NEGATIVE SARS-CoV-2 target nucleic acids are NOT DETECTED. The SARS-CoV-2 RNA is generally detectable in upper and lower  respiratory specimens during the acute phase of infection. The lowest  concentration of SARS-CoV-2  viral copies this assay can detect is 250  copies / mL. A negative result does not preclude SARS-CoV-2 infection  and should not be used as the sole basis for treatment or other  patient management decisions.  A negative result may occur with  improper specimen collection / handling, submission of specimen other  than nasopharyngeal swab, presence of viral mutation(s) within the  areas targeted by this assay, and inadequate number of viral copies  (<250 copies / mL). A negative result must be combined with clinical  observations, patient history, and epidemiological information. If result is POSITIVE SARS-CoV-2 target nucleic acids are DETECTED. The SARS-CoV-2 RNA is generally detectable in upper and lower  respiratory specimens dur ing the acute phase of infection.  Positive  results are indicative of active infection with SARS-CoV-2.  Clinical  correlation with patient history and other diagnostic information is  necessary to determine patient infection  status.  Positive results do  not rule out bacterial infection or co-infection with other viruses. If result is PRESUMPTIVE POSTIVE SARS-CoV-2 nucleic acids MAY BE PRESENT.   A presumptive positive result was obtained on the submitted specimen  and confirmed on repeat testing.  While 2019 novel coronavirus  (SARS-CoV-2) nucleic acids may be present in the submitted sample  additional confirmatory testing may be necessary for epidemiological  and / or clinical management purposes  to differentiate between  SARS-CoV-2 and other Sarbecovirus currently known to infect humans.  If clinically indicated additional testing with an alternate test  methodology [phone]) is advised. The SARS-CoV-2 RNA is generally  detectable in upper and lower respiratory sp ecimens during the acute  phase of infection. The expected result is Negative. Fact Sheet for Patients:  StrictlyIdeas.no Fact Sheet for Healthcare Providers: BankingDealers.co.za This test is not yet approved or cleared by the [place] FDA and has been authorized for detection and/or diagnosis of SARS-CoV-2 by FDA under an Emergency Use Authorization (EUA).  This EUA will remain in effect (meaning this test can be used) for the duration of the COVID-19 declaration under Section 564(b)(1) of the Act, 21 U.S.C. section 360bbb-3(b)(1), unless the authorization is terminated or revoked sooner. Performed at [facility], [place] [address], [place], [place] [address]   Respiratory Panel by PCR     Status: None   Collection Time: 08/06/19  1:48 AM   Specimen: Nasopharyngeal Swab; Respiratory  Result Value Ref Range Status   Adenovirus NOT DETECTED NOT DETECTED Final   Coronavirus 229E NOT DETECTED NOT DETECTED Final    Comment: (NOTE) The Coronavirus on the Respiratory Panel, DOES NOT test for the novel  Coronavirus (2019 nCoV)    Coronavirus HKU1 NOT DETECTED NOT DETECTED Final    Coronavirus NL63 NOT DETECTED NOT DETECTED Final   Coronavirus OC43 NOT DETECTED NOT DETECTED Final   Metapneumovirus NOT DETECTED NOT DETECTED Final   Rhinovirus / Enterovirus NOT DETECTED NOT DETECTED Final   Influenza A NOT DETECTED NOT DETECTED Final   Influenza B NOT DETECTED NOT DETECTED Final   Parainfluenza Virus 1 NOT DETECTED NOT DETECTED Final   Parainfluenza Virus 2 NOT DETECTED NOT DETECTED Final   Parainfluenza Virus 3 NOT DETECTED NOT DETECTED Final   Parainfluenza Virus 4 NOT DETECTED NOT DETECTED Final   Respiratory Syncytial Virus NOT DETECTED NOT DETECTED Final   Bordetella pertussis NOT DETECTED NOT DETECTED Final   Chlamydophila pneumoniae NOT DETECTED NOT DETECTED Final   Mycoplasma pneumoniae NOT DETECTED NOT DETECTED Final    Comment: Performed  at [facility], [place] [address], [place], [place] [address]  Surgical PCR screen     Status: None   Collection Time: 08/07/19 10:15 PM   Specimen: Nasal Mucosa; Nasal Swab  Result Value Ref Range Status   MRSA, PCR NEGATIVE NEGATIVE Final   Staphylococcus aureus NEGATIVE NEGATIVE Final    Comment: (NOTE) The Xpert SA Assay (FDA approved for NASAL specimens in patients 72 years of age and older), is one component of a comprehensive surveillance program. It is not intended to diagnose infection nor to guide or monitor treatment. Performed at [facility], [place] [address], [place], [place] [address]       Studies: Ct Chest W Contrast  Result Date: 08/08/2019 CLINICAL DATA:  Supraglottic and glottic mass. Tracheostomy placement. EXAM: CT CHEST WITH CONTRAST TECHNIQUE: Multidetector CT imaging of the chest was performed during intravenous contrast administration. CONTRAST:  69mL OMNIPAQUE IOHEXOL 300 MG/ML  SOLN COMPARISON:  CT neck 08/05/2019 FINDINGS: Cardiovascular: Coronary artery calcification and aortic atherosclerotic calcification. Great vessels normal Mediastinum/Nodes: Interval placement of a  tracheostomy tube with tip in the proximal trachea. There is gas within the tissue planes of the the thoracic inlet consistent with recent the procedure same day. Small LEFT paratracheal lymph node measures 8 mm. RIGHT hilar lymph node measures 10 mm (image 69/3). No supraclavicular adenopathy. No axillary adenopathy. No internal mammary adenopathy Lungs/Pleura: Severe centrilobular emphysema the upper lobes as well as paraseptal emphysema. No suspicious pulmonary nodules. Small RIGHT effusion. Small LEFT effusion additionally Upper Abdomen: Limited view of the liver, kidneys, pancreas are unremarkable. Normal adrenal glands. Musculoskeletal: No aggressive osseous lesion. IMPRESSION: 1. Interval placement of tracheostomy tube without complication. Gas within the tissue planes of the thoracic inlet consistent with procedure same day. 2. Small mediastinal lymph nodes are upper limits of normal in measurement. PET-CT may add specificity as to malignant potential. 3. No suspicious pulmonary nodules. Paraseptal and centrilobular emphysema noted. Electronically Signed   By: Suzy Bouchard M.D.   On: 08/08/2019 15:32    Scheduled Meds:  Chlorhexidine Gluconate Cloth  6 each Topical Daily   enoxaparin (LOVENOX) injection  30 mg Subcutaneous Q24H   feeding supplement (ENSURE ENLIVE)  237 mL Oral TID   hydrALAZINE       insulin aspart  0-9 Units Subcutaneous TID WC   ipratropium-albuterol  3 mL Nebulization BID   methylPREDNISolone (SOLU-MEDROL) injection  60 mg Intravenous TID   multivitamin with minerals  1 tablet Oral Daily   nicotine  21 mg Transdermal Daily   polyethylene glycol  17 g Oral Daily    Continuous Infusions:  sodium chloride 75 mL/hr at 08/08/19 1400     LOS: 3 days     Alma Friendly, MD [facility]  If 7PM-7AM, please contact night-coverage [email or web address] 08/08/2019, 3:37 PM

## 2019-08-08 NOTE — Progress Notes (Signed)
Pts trach site began bleeding after coughing. Blood was bright red in color and had several clots. Trach suctioned using sterile technique, mouth suctioned, and site was cleaned.  [facility] paged and made aware of the situation.   Will continue to monitor closely.

## 2019-08-08 NOTE — Progress Notes (Signed)
Pt began coughing again and trach site began bleeding again.  Site cleaned again and new gauze applied. [facility] was paged and ordered tussionex. Medication administered. Will continue to monitor closely.

## 2019-08-08 NOTE — Op Note (Signed)
Preop/postop diagnosis: Laryngeal mass Procedure: Tracheotomy under local, direct laryngoscopy with biopsy Anesthesia: General Estimated blood loss: Less than 5 cc Indications: 58 year old with a large mass in his larynx that seems to be both supraglottic glottic and subglottic.  It does appear to erode the thyroid cartilage.  The patient has no interest in a total laryngectomy and wants to proceed with radiation treatment.  This will require a tracheotomy given the narrow nests of his airway.  He was informed the risk and benefits of the procedure and options were discussed all questions were answered and consent was obtained. Procedure: Patient was taken the operating placed supine position after prepping and draping in the usual sterile manner the trach site was outlined and injected with 1% lidocaine with 1 100,000 epinephrine.  The dissection was carried out with electrocautery through the skin and then through the diastases of the strap muscles.  The thyroid isthmus was immediately identified and divided with the electrocautery.  There was good hemostasis.  A inferior based flap was created and a 3-0 chromic suture placed through the flap and sutured to the skin.  The flap was then elevated inferiorly and the #6 Shiley was placed without difficulty.  Good end-tidal CO2 return.  The trach was sutured with 3-0 nylon and cuff inflated.  The direction was then carried to his pharynx where the Dedo scope was inserted and the glottis was examined.  The tumor was on the laryngeal surface of the epiglottis extending down into the left side looked more prominent than the right but tumor identified on both sides.  The cords were even difficulty even identify structurally.  The tumor definitely appeared to go subglottic.  Both cords were stiff but the left one seemed to be fixed.  Biopsies were taken with the cup forcep in multiple locations.  The pledget was used for hemostasis.  The scope was removed the patient  was then awakened brought to recovery in stable condition counts correct

## 2019-08-08 NOTE — Transfer of Care (Signed)
Immediate Anesthesia Transfer of Care Note  Patient: Perry Waller  Procedure(s) Performed: TRACHEOSTOMY (N/A Neck) DIRECT LARYNGOSCOPY (N/A Throat)  Patient Location: PACU  Anesthesia Type:General  Level of Consciousness: drowsy  Airway & Oxygen Therapy: Patient Spontanous Breathing  Post-op Assessment: Report given to RN and Post -op Vital signs reviewed and stable  Post vital signs: Reviewed and stable  Last Vitals:  Vitals Value Taken Time  BP 149/102 08/08/19 0811  Temp    Pulse 59 08/08/19 0812  Resp 19 08/08/19 0812  SpO2 100 % 08/08/19 0812  Vitals shown include unvalidated device data.  Last Pain:  Vitals:   08/08/19 0415  TempSrc: Oral  PainSc:          Complications: No apparent anesthesia complications

## 2019-08-08 NOTE — Progress Notes (Signed)
Spoke with patient sister Blain Pais) and informed her of patients location and status post-trach placement. Stated she will be visiting within the hour. Patients belongings received from 4E and given to patient.

## 2019-08-08 NOTE — Progress Notes (Signed)
RN made this RT aware of some bleeding that developed at pts. new Trach site after pt. coughing, #6 Cuffed Shiley was placed this date by ENT at [facility] by Dr. Janace Hoard with sutures remaining intact. This RT has seen pt. initially upon beginning of shift after his arrival shortly there before, transferred from [facility] to [facility] on room air for Radiation tx.'s, bleeding was mild to moderated and was controlled initially by RN with towel, RT place # gauze pads@ site with bleeding noted to be minimal at this point, b/l b.s. with pt. in little distress, IC changed prior upon arrival by this RT on initial pt. contact/rounds, pt. remains on 28% Fi02 ATC with plans to transist to humidified room air soon. RN made NP-Kirby aware of pt. with plans for RT/RN to monitor and contact further if needed.

## 2019-08-08 NOTE — Progress Notes (Signed)
  Speech Language Pathology Treatment: Dysphagia  Patient Details Name: Perry Waller MRN: [id number] DOB: 01/12/1961 Today's Date: 08/08/2019 Time: 1212-1232 SLP Time Calculation (min) (ACUTE ONLY): 20 min  Assessment / Plan / Recommendation Clinical Impression  Pt has had Shiley #6 cuffed trach placed this morning.  Cuff inflated.  RN reports that RT had deflated cuff earlier, but it was reinflated following increased WoB with higher activity level.  Pt seen for swallowing assessment post procedure.  On 8/9 pt had MBS with recommendations for mechanical soft diet and honey thick liquids. Based on this study, only honey thick liquid, puree, and mechanical soft solids were trialed at bedside today. Audible swallow noted.  Pt tolerated all consistencies trialed.  Pt required two swallows with honey thick liquids.  Pt exhibited good oral clearance of solids.  Plans to transfer to [facility] step down unit later today to initiate tumor management.  Pt has declined total laryngectomy with plans to pursue radiation treatment.  Recommend resuming mechanical soft diet with honey thick liquid with cuff inflated.  If pt exhibits any difficulty with PO intake, please make pt NPO and notify SLP so that a repeat instrumental evaluation can be scheduled.  Consider PMV evaluation when pt is medically appropriate and can tolerate cuff deflation.    HPI HPI: [name], 58y/m, presented to ER with shortness of breath and neck swelling. PMH significant for COPD, tobacco abuse and arthritis. 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 He has lost more than 40 pounds in the last several months. Tumor is stable with no obvious immedicate airway distress. Trach placed 8/10 at 8 AM, Shiley #6 cuffed.      SLP Plan  Continue with current plan of care       Recommendations  Diet recommendations: Dysphagia 3 (mechanical soft);Honey-thick  liquid Liquids provided via: Cup;No straw Supervision: Staff to assist with self feeding Compensations: Slow rate;Small sips/bites Postural Changes and/or Swallow Maneuvers: Seated upright 90 degrees                Oral Care Recommendations: Oral care BID SLP Visit Diagnosis: Dysphagia, oropharyngeal phase (R13.12) Plan: Continue with current plan of care       [place], [place], [place] Office: [phone]; Pager [phone]): [phone] 08/08/2019, 1:05 PM

## 2019-08-08 NOTE — Progress Notes (Signed)
Report given to [facility] ICU RN and [facility] transport request submitted. Will arrive in appx 2 hours.

## 2019-08-09 ENCOUNTER — Telehealth: Payer: Self-pay | Admitting: *Deleted

## 2019-08-09 ENCOUNTER — Encounter (HOSPITAL_COMMUNITY): Payer: Self-pay | Admitting: Otolaryngology

## 2019-08-09 ENCOUNTER — Ambulatory Visit
Admit: 2019-08-09 | Discharge: 2019-08-09 | Disposition: A | Discharge status: Home / Self Care | Payer: Medicaid Other | Attending: Radiation Oncology | Admitting: Radiation Oncology

## 2019-08-09 ENCOUNTER — Institutional Professional Consult (permissible substitution): Payer: Medicaid Other | Admitting: Critical Care Medicine

## 2019-08-09 ENCOUNTER — Ambulatory Visit
Admit: 2019-08-09 | Discharge: 2019-08-09 | Disposition: A | Discharge status: Home / Self Care | Payer: Medicaid Other | Source: Ambulatory Visit | Attending: Radiation Oncology | Admitting: Radiation Oncology

## 2019-08-09 ENCOUNTER — Encounter: Payer: Self-pay | Admitting: *Deleted

## 2019-08-09 VITALS — BP 137/88 | HR 78 | Temp 98.3°F

## 2019-08-09 DIAGNOSIS — J449 Chronic obstructive pulmonary disease, unspecified: Secondary | ICD-10-CM | POA: Insufficient documentation

## 2019-08-09 DIAGNOSIS — Z809 Family history of malignant neoplasm, unspecified: Secondary | ICD-10-CM | POA: Insufficient documentation

## 2019-08-09 DIAGNOSIS — Z79899 Other long term (current) drug therapy: Secondary | ICD-10-CM | POA: Insufficient documentation

## 2019-08-09 DIAGNOSIS — Z794 Long term (current) use of insulin: Secondary | ICD-10-CM | POA: Insufficient documentation

## 2019-08-09 DIAGNOSIS — C321 Malignant neoplasm of supraglottis: Secondary | ICD-10-CM

## 2019-08-09 DIAGNOSIS — C329 Malignant neoplasm of larynx, unspecified: Secondary | ICD-10-CM

## 2019-08-09 DIAGNOSIS — E43 Unspecified severe protein-calorie malnutrition: Secondary | ICD-10-CM

## 2019-08-09 DIAGNOSIS — Z7901 Long term (current) use of anticoagulants: Secondary | ICD-10-CM | POA: Insufficient documentation

## 2019-08-09 DIAGNOSIS — M129 Arthropathy, unspecified: Secondary | ICD-10-CM | POA: Insufficient documentation

## 2019-08-09 DIAGNOSIS — F17210 Nicotine dependence, cigarettes, uncomplicated: Secondary | ICD-10-CM | POA: Insufficient documentation

## 2019-08-09 DIAGNOSIS — K5900 Constipation, unspecified: Secondary | ICD-10-CM | POA: Insufficient documentation

## 2019-08-09 LAB — BASIC METABOLIC PANEL
Anion gap: 9 (ref 5–15)
BUN: 8 mg/dL (ref 6–20)
CO2: 28 mmol/L (ref 22–32)
Calcium: 8.4 mg/dL — ABNORMAL LOW (ref 8.9–10.3)
Chloride: 100 mmol/L (ref 98–111)
Creatinine, Ser: 0.59 mg/dL — ABNORMAL LOW (ref 0.61–1.24)
GFR calc Af Amer: >60 mL/min (ref >60)
GFR calc non Af Amer: >60 mL/min (ref >60)
Glucose, Bld: 140 mg/dL — ABNORMAL HIGH (ref 70–99)
Potassium: 3.6 mmol/L (ref 3.5–5.1)
Sodium: 137 mmol/L (ref 135–145)

## 2019-08-09 LAB — CBC WITH DIFFERENTIAL/PLATELET
Abs Immature Granulocytes: 0.05 10*3/uL (ref 0.00–0.07)
Basophils Absolute: 0 10*3/uL (ref 0.0–0.1)
Basophils Relative: 0 %
Eosinophils Absolute: 0 10*3/uL (ref 0.0–0.5)
Eosinophils Relative: 0 %
HCT: 40.9 % (ref 39.0–52.0)
Hemoglobin: 12.9 g/dL — ABNORMAL LOW (ref 13.0–17.0)
Immature Granulocytes: 1 %
Lymphocytes Relative: 10 %
Lymphs Abs: 1.1 10*3/uL (ref 0.7–4.0)
MCH: 30.1 pg (ref 26.0–34.0)
MCHC: 31.5 g/dL (ref 30.0–36.0)
MCV: 95.6 fL (ref 80.0–100.0)
Monocytes Absolute: 0.6 10*3/uL (ref 0.1–1.0)
Monocytes Relative: 5 %
Neutro Abs: 9.3 10*3/uL — ABNORMAL HIGH (ref 1.7–7.7)
Neutrophils Relative %: 84 %
Platelets: 257 10*3/uL (ref 150–400)
RBC: 4.28 MIL/uL (ref 4.22–5.81)
RDW: 12.5 % (ref 11.5–15.5)
WBC: 11 10*3/uL — ABNORMAL HIGH (ref 4.0–10.5)
nRBC: 0 % (ref 0.0–0.2)

## 2019-08-09 LAB — GLUCOSE, CAPILLARY
Glucose-Capillary: 124 mg/dL — ABNORMAL HIGH (ref 70–99)
Glucose-Capillary: 112 mg/dL — ABNORMAL HIGH (ref 70–99)
Glucose-Capillary: 121 mg/dL — ABNORMAL HIGH (ref 70–99)
Glucose-Capillary: 142 mg/dL — ABNORMAL HIGH (ref 70–99)

## 2019-08-09 NOTE — Progress Notes (Addendum)
  Speech Language Pathology Treatment: Dysphagia;Passy Muir Speaking valve  Patient Details Name: Perry Waller MRN: [id number] DOB: 05/31/1961 Today's Date: 08/09/2019 Time: 1310-1350 SLP Time Calculation (min) (ACUTE ONLY): 40 min  Assessment / Plan / Recommendation Clinical Impression  Pt was seen at bedside during lunch to assess diet tolerance and provide education. MBS was completed 08/07/2019, with recommendation for mechanical soft solids and honey thick liquids. Pt demonstrated no overt s/s aspiration today. Safe swallow precautions per MBS were reviewed with pt and posted at [name].    #6 Shiley trach was placed 08/08/2019, which may alter pressures necessary for safe swallow. Pt was unable to produce voicing with finger occlusion today. Prior to trach placement, SLP notes indicate hoarse voice quality. Recommend [name] evaluation to maximize swallow safety and facilitate effective communication with staff and family. RN informed.   HPI HPI: [name], 58y/m, presented to ER with shortness of breath and neck swelling. PMH significant for COPD, tobacco abuse and arthritis. 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 He has lost more than 40 pounds in the last several months. Tumor is stable with no obvious immediate airway distress. Trach placed 8/10 at 8 AM, Shiley #6 cuffed.      SLP Plan  Continue with current plan of care: recommend [name] evaluation       Recommendations  Diet recommendations: Dysphagia 3 (mechanical soft);Honey-thick liquid Liquids provided via: Cup;No straw Medication Administration: Crushed with puree Supervision: Staff to assist with self feeding;Intermittent supervision to cue for compensatory strategies Compensations: Slow rate;Small sips/bites;Minimize environmental distractions Postural Changes and/or Swallow Maneuvers: Seated upright 90 degrees;Upright 30-60 min after meal      Patient may  use Passy-Muir Speech Valve: (Recommend order for [name] evaluation) MD: Please consider changing trach tube to : Smaller size;Cuffless         Oral Care Recommendations: Oral care BID Follow up Recommendations: (TBD) SLP Visit Diagnosis: Dysphagia, oropharyngeal phase (R13.12) Plan: Continue with current plan of care(recommend [name] orders)       GO           Perry Waller B. Perry Waller [facility] [facility], CCC-SLP Speech Language Pathologist [phone]  Shonna Chock 08/09/2019, 2:02 PM

## 2019-08-09 NOTE — Telephone Encounter (Signed)
Oncology Nurse Navigator Documentation  Called pt's sister to provide update re treatment plan for XRT and possibly chemotherapy.  She was bedside. She voiced understanding of 7 weeks XRT, M-F, first tmt next Wed. She confirmed he will need transportation assistance as she is returning to work next week. She indicated she has been reading through New Patient Packet I provided him earlier, noted my contact information. I encouraged her to call me with questions/concerns as he moves forward with tmt.  She agreed.  [name], RN, BSN Head & Neck Oncology Nurse [place] [facility] [place] [phone]
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
## 2019-08-09 NOTE — Progress Notes (Signed)
Pt sister Blain Pais) called and would like a physician to call her and update her on what is going on with the pt, says that she nor the pt knows what is going on with the pt condition and treatment. RN tried to update her on what information she knew. Will continue to monitor.
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
## 2019-08-09 NOTE — Progress Notes (Signed)
Marland Kitchen  PROGRESS NOTE    Perry Waller  XIP:[id number] DOB: [birth year] DOA: 08/05/2019 PCP: [name], FNP   Brief Narrative:   Perry Waller a 58 y.o.malewith medical history significant ofCOPD, tobacco abuse, arthritis, who presents with shortness breath, neck swelling. Patient states that he has been having cough, shortness of breath, wheezing for more than 1 week. No chest pain. No fever or chills. He notices neckswelling and has difficult swallowing. He states that he haslost more than 40 pounds in the past several months. Pt was seen byoutpatient provider this morning who referred him for CT neck.Based on imaging findings there was concern for airway compromise, and patient was instructed to go to the ER. In the ED, oxygen saturation 97% on room air, chest x-ray showed COPD with emphysema. Patient admitted to stepdown asinpatient. Dr. Janace Hoard of ENT was consulted.   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     - Presented with hoarse voice, bilateral wheezing, SOB     - CT soft tissue neck showed large mass of the larynx consistent with carcinoma, compression right jugular vein due to enlarged lymph node on the right     - CT chest with no suspicious pulmonary nodules     - ENT and rad onc on board     - SLP on board (dysphagia 3 diet)     - Continue IV steroid, tapering down     - Bx results pending  Acute COPD exacerbation     - Likely worsened by airway narrowing secondary to neck mass     - Afebrile, with no leukocytosis     - COVID-19 negative, respiratory viral panel negative     - Chest x-ray showed hyperinflation of the lungs, suggesting COPD or emphysema     - Duo nebs, IV Solu-Medrol, PRN inhalers, Mucinex  Steroid-induced hyperglycemia     - SSI,  Accu-Cheks     - Continue to taper of steroids  Hypokalemia/hypomagnesemia      - Replete, monitor  Tobacco abuse:     - Advised to quit     - Nicotine patch  DVT prophylaxis: lovenox Code Status: FULL Family Communication: to sister by phone   Disposition Plan: TBD  Consultants:   ENT  Rad Onc  Procedures:   Trach  ROS:  Denies CP, dyspnea. Reports pain. Remainder 10-pt ROS is negative for all not previously mentioned.  Subjective: Writing that he had blood around his trach cuff  Objective: Vitals:   08/09/19 0800 08/09/19 0900 08/09/19 1152 08/09/19 1205  BP: (!) 148/102 (!) 128/96    Pulse: 70 84    Resp: 17 (!) 26    Temp: 98.5 F (36.9 C)   98.6 F (37 C)  TempSrc: Oral   Oral  SpO2: 98% 100% 100%   Weight:      Height:        Intake/Output Summary (Last 24 hours) at 08/09/2019 1400 Last data filed at 08/09/2019 0900 Gross per 24 hour  Intake 1425 ml  Output 1025 ml  Net 400 ml   Filed Weights   08/06/19 0120  Weight: 47 kg    Examination:  General: 58 y.o. male resting in bed in NAD, ill appearing Eyes: PERRL, normal sclera ENMT:  Nares patent w/o discharge, orophaynx clear, dentition normal, ears w/o discharge/lesions/ulcers; trach noted w/ dried blood Cardiovascular: tachy, +S1, S2, no m/g/r, equal pulses throughout Respiratory: minor b/l wheeze noted, no w/r/r, normal WOB GI: BS+, NDNT, no masses noted, no organomegaly noted MSK: No e/c/c Skin: No rashes, bruises, ulcerations noted Neuro: alert to name, follows commands, no focal deficits Psyc: Appropriate interaction and affect, calm/cooperative   Data Reviewed: I have personally reviewed following labs and imaging studies.  CBC: Recent Labs  Lab 08/05/19 1821 08/06/19 1522 08/07/19 0259 08/08/19 0325 08/09/19 0834  WBC 4.9 7.0 8.1 7.8 11.0*  NEUTROABS 3.0 5.7 7.0 6.6 9.3*  HGB 13.7 12.0* 11.0* 11.0* 12.9*  HCT 42.7 36.3* 34.1* 34.2* 40.9  MCV 95.5 93.1 94.7 94.2 95.6    PLT 257 262 257 237 035   Basic Metabolic Panel: Recent Labs  Lab 08/05/19 1821 08/06/19 0248 08/06/19 1522 08/07/19 0259 08/08/19 0325 08/09/19 0834  NA 136  --  138 137 139 137  K 3.4*  --  4.0 4.0 4.2 3.6  CL 99  --  105 101 104 100  CO2 22  --  23 24 27 28   GLUCOSE 82  --  170* 258* 166* 140*  BUN 5*  --  6 6 7 8   CREATININE 0.71  --  0.80 0.90 0.65 0.59*  CALCIUM 8.7*  --  8.4* 8.4* 8.4* 8.4*  MG  --  1.6*  --  1.6* 1.7  --    GFR: Estimated Creatinine Clearance: 66.9 mL/min (A) (by C-G formula based on SCr of 0.59 mg/dL (L)). Liver Function Tests: Recent Labs  Lab 08/05/19 1821  AST 25  ALT 13  ALKPHOS 66  BILITOT 0.7  PROT 6.8  ALBUMIN 3.1*   No results for input(s): LIPASE, AMYLASE in the last 168 hours. No results for input(s): AMMONIA in the last 168 hours. Coagulation Profile: Recent Labs  Lab 08/06/19 0248  INR 1.1   Cardiac Enzymes: No results for input(s): CKTOTAL, CKMB, CKMBINDEX, TROPONINI in the last 168 hours. BNP (last 3 results) No results for input(s): PROBNP in the last 8760 hours. HbA1C: No results for input(s): HGBA1C in the last 72 hours. CBG: Recent Labs  Lab 08/08/19 0625 08/08/19 0821 08/08/19 2150 08/09/19 0803 08/09/19 1206  GLUCAP 128* 96 158* 124* 112*   Lipid Profile: No results for input(s): CHOL, HDL, LDLCALC, TRIG, CHOLHDL, LDLDIRECT in the last 72 hours. Thyroid Function Tests: No results for input(s): TSH, T4TOTAL, FREET4, T3FREE, THYROIDAB in the last 72 hours. Anemia Panel: No results for input(s): VITAMINB12, FOLATE, FERRITIN, TIBC, IRON, RETICCTPCT in the last 72 hours. Sepsis Labs: No results for input(s): PROCALCITON, LATICACIDVEN in the last 168 hours.  Recent Results (from the past 240 hour(s))  SARS Coronavirus 2 [facility] order, Performed in [facility] hospital lab) Nasopharyngeal Nasopharyngeal Swab     Status: None   Collection Time: 08/05/19  7:35 PM   Specimen: Nasopharyngeal Swab  Result  Value Ref Range Status   SARS Coronavirus 2 NEGATIVE NEGATIVE Final    Comment: (NOTE) If result is NEGATIVE SARS-CoV-2 target nucleic acids are NOT DETECTED. The SARS-CoV-2 RNA is generally detectable in upper and lower  respiratory specimens during the acute phase of infection. The lowest  concentration of SARS-CoV-2 viral copies this assay can detect is 250  copies / mL. A negative result does not preclude SARS-CoV-2 infection  and should not be used as the sole basis for treatment or other  patient management  decisions.  A negative result may occur with  improper specimen collection / handling, submission of specimen other  than nasopharyngeal swab, presence of viral mutation(s) within the  areas targeted by this assay, and inadequate number of viral copies  (<250 copies / mL). A negative result must be combined with clinical  observations, patient history, and epidemiological information. If result is POSITIVE SARS-CoV-2 target nucleic acids are DETECTED. The SARS-CoV-2 RNA is generally detectable in upper and lower  respiratory specimens dur ing the acute phase of infection.  Positive  results are indicative of active infection with SARS-CoV-2.  Clinical  correlation with patient history and other diagnostic information is  necessary to determine patient infection status.  Positive results do  not rule out bacterial infection or co-infection with other viruses. If result is PRESUMPTIVE POSTIVE SARS-CoV-2 nucleic acids MAY BE PRESENT.   A presumptive positive result was obtained on the submitted specimen  and confirmed on repeat testing.  While 2019 novel coronavirus  (SARS-CoV-2) nucleic acids may be present in the submitted sample  additional confirmatory testing may be necessary for epidemiological  and / or clinical management purposes  to differentiate between  SARS-CoV-2 and other Sarbecovirus currently known to infect humans.  If clinically indicated additional testing  with an alternate test  methodology [phone]) is advised. The SARS-CoV-2 RNA is generally  detectable in upper and lower respiratory sp ecimens during the acute  phase of infection. The expected result is Negative. Fact Sheet for Patients:  StrictlyIdeas.no Fact Sheet for Healthcare Providers: BankingDealers.co.za This test is not yet approved or cleared by the [place] FDA and has been authorized for detection and/or diagnosis of SARS-CoV-2 by FDA under an Emergency Use Authorization (EUA).  This EUA will remain in effect (meaning this test can be used) for the duration of the COVID-19 declaration under Section 564(b)(1) of the Act, 21 U.S.C. section 360bbb-3(b)(1), unless the authorization is terminated or revoked sooner. Performed at [facility], [place] [address] [place], [place] [address]   Respiratory Panel by PCR     Status: None   Collection Time: 08/06/19  1:48 AM   Specimen: Nasopharyngeal Swab; Respiratory  Result Value Ref Range Status   Adenovirus NOT DETECTED NOT DETECTED Final   Coronavirus 229E NOT DETECTED NOT DETECTED Final    Comment: (NOTE) The Coronavirus on the Respiratory Panel, DOES NOT test for the novel  Coronavirus (2019 nCoV)    Coronavirus HKU1 NOT DETECTED NOT DETECTED Final   Coronavirus NL63 NOT DETECTED NOT DETECTED Final   Coronavirus OC43 NOT DETECTED NOT DETECTED Final   Metapneumovirus NOT DETECTED NOT DETECTED Final   Rhinovirus / Enterovirus NOT DETECTED NOT DETECTED Final   Influenza A NOT DETECTED NOT DETECTED Final   Influenza B NOT DETECTED NOT DETECTED Final   Parainfluenza Virus 1 NOT DETECTED NOT DETECTED Final   Parainfluenza Virus 2 NOT DETECTED NOT DETECTED Final   Parainfluenza Virus 3 NOT DETECTED NOT DETECTED Final   Parainfluenza Virus 4 NOT DETECTED NOT DETECTED Final   Respiratory Syncytial Virus NOT DETECTED NOT DETECTED Final   Bordetella pertussis NOT DETECTED NOT  DETECTED Final   Chlamydophila pneumoniae NOT DETECTED NOT DETECTED Final   Mycoplasma pneumoniae NOT DETECTED NOT DETECTED Final    Comment: Performed at [facility], [place]. [address] [place], [place] [address]  Surgical PCR screen     Status: None   Collection Time: 08/07/19 10:15 PM   Specimen: Nasal Mucosa; Nasal Swab  Result Value Ref Range Status   MRSA, PCR NEGATIVE NEGATIVE Final   Staphylococcus aureus NEGATIVE NEGATIVE Final    Comment: (NOTE) The Xpert SA Assay (FDA approved for NASAL specimens in patients 44 years of age and older), is one component of a comprehensive surveillance program. It is not intended to diagnose infection nor to guide or monitor treatment. Performed at [facility], [place] [address], [place], [place] [address]       Radiology Studies: Ct Chest W Contrast  Result Date: 08/08/2019 CLINICAL DATA:  Supraglottic and glottic mass. Tracheostomy placement. EXAM: CT CHEST WITH CONTRAST TECHNIQUE: Multidetector CT imaging of the chest was performed during intravenous contrast administration. CONTRAST:  30mL OMNIPAQUE IOHEXOL 300 MG/ML  SOLN COMPARISON:  CT neck 08/05/2019 FINDINGS: Cardiovascular: Coronary artery calcification and aortic atherosclerotic calcification. Great vessels normal Mediastinum/Nodes: Interval placement of a tracheostomy tube with tip in the proximal trachea. There is gas within the tissue planes of the the thoracic inlet consistent with recent the procedure same day. Small LEFT paratracheal lymph node measures 8 mm. RIGHT hilar lymph node measures 10 mm (image 69/3). No supraclavicular adenopathy. No axillary adenopathy. No internal mammary adenopathy Lungs/Pleura: Severe centrilobular emphysema the upper lobes as well as paraseptal emphysema. No suspicious pulmonary nodules. Small RIGHT effusion. Small LEFT effusion additionally Upper Abdomen: Limited view of the liver, kidneys, pancreas are unremarkable. Normal adrenal glands.  Musculoskeletal: No aggressive osseous lesion. IMPRESSION: 1. Interval placement of tracheostomy tube without complication. Gas within the tissue planes of the thoracic inlet consistent with procedure same day. 2. Small mediastinal lymph nodes are upper limits of normal in measurement. PET-CT may add specificity as to malignant potential. 3. No suspicious pulmonary nodules. Paraseptal and centrilobular emphysema noted. Electronically Signed   By: Suzy Bouchard M.D.   On: 08/08/2019 15:32     Scheduled Meds:  Chlorhexidine Gluconate Cloth  6 each Topical Daily   enoxaparin (LOVENOX) injection  30 mg Subcutaneous Q24H   feeding supplement (ENSURE ENLIVE)  237 mL Oral TID   insulin aspart  0-9 Units Subcutaneous TID WC   ipratropium-albuterol  3 mL Nebulization BID   methylPREDNISolone (SOLU-MEDROL) injection  40 mg Intravenous Q12H   multivitamin with minerals  1 tablet Oral Daily   nicotine  21 mg Transdermal Daily   polyethylene glycol  17 g Oral Daily   Continuous Infusions:  sodium chloride 75 mL/hr at 08/08/19 2255     LOS: 4 days    Time spent: 25 minutes spent in the coordination of care today.    Jonnie Finner, DO [facility] Pager [phone]  If 7PM-7AM, please contact night-coverage [email or web address] Password [email or web address] 08/09/2019, 2:00 PM

## 2019-08-09 NOTE — Progress Notes (Signed)
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  All relevant records and images related to the planned course of therapy were reviewed.  The patient freely provided informed written consent to proceed with treatment after reviewing the details related to the planned course of therapy. The consent form was witnessed and verified by the simulation staff.    The patient was laid in the supine position on the table. An Aquaplast head and shoulder mask was custom fitted to the patient's anatomy. High-resolution CT axial imaging was obtained of the head and neck without contrast. I verified that the quality of the imaging is good for treatment planning. 1 Medically Necessary Treatment Device was fabricated and supervised by me: Aquaplast mask.  Treatment planning note I plan to treat the patient with IMRT. I plan to treat the patient's tumor and bilateral neck nodes. I plan to treat to a total dose of 70 Gray in 35  fractions. Dose calculation was ordered from dosimetry.  IMRT planning Note  IMRT is medically necessary and an important modality to deliver adequate dose to the patient's at risk tissues while sparing the patient's normal structures, including the: esophagus, parotid tissue, mandible, brain stem, spinal cord, oral cavity, brachial plexus.  This justifies the use of IMRT in the patient's treatment.   Special Treatment Procedure Note:  The patient will likely be receiving chemotherapy concurrently. Chemotherapy heightens the risk of side effects. I have considered this during the patient's treatment planning process and will monitor the patient accordingly for side effects on a weekly basis. Concurrent chemotherapy increases the complexity of this patient's treatment and therefore  this constitutes a special treatment procedure.  -----------------------------------  Eppie Gibson, MD

## 2019-08-09 NOTE — Progress Notes (Signed)
Oncology Nurse Navigator Documentation  Met with patient during initial consult with Dr. Isidore Moos.  He was brought to RadOnc by bed from [facility] 1226.  He arrived with trach O2 on 4L.    . Iintroduced myself as his Navigator, explained my role as a member of the Care Team.   . Provided New Patient Information packet, discussed contents: o Contact information for physician(s), myself, other members of the Care Team. o Advance Directive information ([place] blue pamphlet with LCSW contact info); provided [facility] AD booklet at his request, encouraged him to contact Gwinda Maine LCSW to complete. o Fall Prevention Patient [facility] campus map with highlight of [facility] information . Provided introductory explanation of radiation treatment including SIM planning and purpose of Aquaplast head and shoulder mask, showed him example.   . Provided and discussed education handouts for PEG and PAC.  Marland Kitchen He indicated: Marland Kitchen Will need transportation assistance upon DC and start of RT.  He indicated understanding I will coordinate. . Lives with sister and niece.  He requested she be called to provide update, voiced understanding I will do so. . Accompanied him to CT SIM.  He tolerated procedure without incident. . I encouraged him to contact me with questions/concerns as treatments/procedures begin.    [name], RN, BSN Head & Neck Oncology Nurse [place] at [place] [phone]

## 2019-08-10 ENCOUNTER — Other Ambulatory Visit: Payer: Self-pay | Admitting: Radiation Oncology

## 2019-08-10 ENCOUNTER — Encounter: Payer: Self-pay | Admitting: Radiation Oncology

## 2019-08-10 ENCOUNTER — Telehealth: Payer: Self-pay | Admitting: *Deleted

## 2019-08-10 DIAGNOSIS — C321 Malignant neoplasm of supraglottis: Secondary | ICD-10-CM

## 2019-08-10 LAB — GLUCOSE, CAPILLARY
Glucose-Capillary: 136 mg/dL — ABNORMAL HIGH (ref 70–99)
Glucose-Capillary: 103 mg/dL — ABNORMAL HIGH (ref 70–99)
Glucose-Capillary: 122 mg/dL — ABNORMAL HIGH (ref 70–99)
Glucose-Capillary: 97 mg/dL (ref 70–99)

## 2019-08-10 LAB — CBC WITH DIFFERENTIAL/PLATELET
Abs Immature Granulocytes: 0.03 10*3/uL (ref 0.00–0.07)
Basophils Absolute: 0 10*3/uL (ref 0.0–0.1)
Basophils Relative: 0 %
Eosinophils Absolute: 0 10*3/uL (ref 0.0–0.5)
Eosinophils Relative: 0 %
HCT: 36.6 % — ABNORMAL LOW (ref 39.0–52.0)
Hemoglobin: 11.5 g/dL — ABNORMAL LOW (ref 13.0–17.0)
Immature Granulocytes: 0 %
Lymphocytes Relative: 17 %
Lymphs Abs: 1.6 10*3/uL (ref 0.7–4.0)
MCH: 30.4 pg (ref 26.0–34.0)
MCHC: 31.4 g/dL (ref 30.0–36.0)
MCV: 96.8 fL (ref 80.0–100.0)
Monocytes Absolute: 1 10*3/uL (ref 0.1–1.0)
Monocytes Relative: 11 %
Neutro Abs: 6.7 10*3/uL (ref 1.7–7.7)
Neutrophils Relative %: 72 %
Platelets: 254 10*3/uL (ref 150–400)
RBC: 3.78 MIL/uL — ABNORMAL LOW (ref 4.22–5.81)
RDW: 12.4 % (ref 11.5–15.5)
WBC: 9.4 10*3/uL (ref 4.0–10.5)
nRBC: 0 % (ref 0.0–0.2)

## 2019-08-10 LAB — BASIC METABOLIC PANEL
Anion gap: 8 (ref 5–15)
BUN: 15 mg/dL (ref 6–20)
CO2: 29 mmol/L (ref 22–32)
Calcium: 8 mg/dL — ABNORMAL LOW (ref 8.9–10.3)
Chloride: 103 mmol/L (ref 98–111)
Creatinine, Ser: 0.75 mg/dL (ref 0.61–1.24)
GFR calc Af Amer: >60 mL/min (ref >60)
GFR calc non Af Amer: >60 mL/min (ref >60)
Glucose, Bld: 147 mg/dL — ABNORMAL HIGH (ref 70–99)
Potassium: 3.6 mmol/L (ref 3.5–5.1)
Sodium: 140 mmol/L (ref 135–145)

## 2019-08-10 LAB — MAGNESIUM: Magnesium: 2 mg/dL (ref 1.7–2.4)

## 2019-08-10 MED ORDER — METHYLPREDNISOLONE SODIUM SUCC 125 MG IJ SOLR
60.0000 mg | Freq: Every day | INTRAMUSCULAR | Status: DC
  Administered 2019-08-11 – 2019-08-12 (×2): 60 mg via INTRAVENOUS
  Filled 2019-08-10 (×2): qty 2

## 2019-08-10 MED ORDER — AMLODIPINE BESYLATE 5 MG PO TABS
5.0000 mg | ORAL_TABLET | Freq: Every day | ORAL | Status: DC
  Administered 2019-08-10 – 2019-08-12 (×3): 5 mg via ORAL
  Filled 2019-08-10 (×3): qty 1

## 2019-08-10 NOTE — Progress Notes (Signed)
Pt. Sister updated on patient status and transfer to 1510. She states she will come after she gets off work at 7 pm.

## 2019-08-10 NOTE — Telephone Encounter (Signed)
Oncology Nurse Navigator Documentation  Per agreed upon notification procedure, sent e-mail to Transportation Coordinator Drucie Ip requesting transportation for patient beginning 8/19.  [name], RN, BSN Head & Neck Oncology Nurse [place] at [place] [phone]

## 2019-08-10 NOTE — Progress Notes (Signed)
Speech Language Pathology Treatment: Dysphagia  Patient Details Name: Perry Waller MRN: [id number] DOB: 03/14/1961 Today's Date: 08/10/2019 Time: 3295-1884 SLP Time Calculation (min) (ACUTE ONLY): 39 min  Assessment / Plan / Recommendation Clinical Impression  Pt is reporting frustration as he "wants to talk".  SLP concerned regarding lumen of pt's airway/laryngeal opening being 8 mm- suspect air flow via upper airway will be severely compromised with a [name] and may quickly result in breath-stacking/CO2 retention. He understood concept when described in laymen terms *eg straw vs paper towel cardboard.  Advised pt SLP would document his concerns/wishes.    SLP provided pt with 2 communication boards for efficient communication and he is using pen and paper. Also conveyed possibility for [name] use in future with tumor decreasing in size with XRT, defer to ENT for [name] appropriateness at this time.    Addressed importance of adequate po intake with pt undergoing radiation tx - as he is resistant to feeding tube.  He wrote that his weight loss was "over months" however educated him to dysphagia with XRT and feeding tube would provide nutrition while pt would be encouraged to continue po by mouth for enjoyment and to decrease disuse muscle atrophy.  Assured pt that he would eat by mouth also in addition to feeding tube.  He continues to deny having it placed.    Reviewed pt's MBS compared to a normal MBS with him to facilitate his understanding.   Pt provided with honey thick liquid, nectar liquid - using soda to try more palatable liquid.  NO indication of aspiration or significant dysphagia with po observed.  Pt's MBS was done prior to trach placement therefore his risk of silent aspiration is higher.  Will monitor pt's po tolerance closely by monitoring lung sounds, WBC, temperatures, etc.    Recommend to advance to nectar liquids, allow pt ice - SLP will follow up for dysphagia management and to aid pt  with expressive communication.  He does not have a smart phone, thus apps are not available to him for communication unfortunately.      HPI HPI: Mr. Linley Moxley, 58y/m, presented to ER with shortness of breath and neck swelling. PMH significant for COPD, tobacco abuse and arthritis. 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 He has lost more than 40 pounds in the last several months. Tumor is stable with no obvious immediate airway distress. Trach placed 8/10 at 8 AM, Shiley #6 cuffed.      SLP Plan  Continue with current plan of care(recommend [name] orders)       Recommendations  Diet recommendations: Dysphagia 3 (mechanical soft);Nectar-thick liquid(free ice chips) Liquids provided via: Cup;No straw Medication Administration: Whole meds with puree Supervision: Staff to assist with self feeding;Intermittent supervision to cue for compensatory strategies Compensations: Slow rate;Small sips/bites;Minimize environmental distractions Postural Changes and/or Swallow Maneuvers: Seated upright 90 degrees;Upright 30-60 min after meal(stop intake if pt coughing)      Patient may use Passy-Muir Speech Valve: (Recommend order for [name] evaluation) MD: Please consider changing trach tube to : Smaller size;Cuffless         Oral Care Recommendations: Oral care BID Follow up Recommendations: (TBD) SLP Visit Diagnosis: Dysphagia, oropharyngeal phase (R13.12) Plan: Continue with current plan of care(recommend [name] orders)       GO                Macario Golds 08/10/2019, 5:05 PM  Luanna Salk, [facility] SLP Acute Rehab Services Pager [phone] Office [phone]

## 2019-08-10 NOTE — Progress Notes (Signed)
PROGRESS NOTE    Perry Waller  OQH:[id number] DOB: 27-Jan-1961 DOA: 08/05/2019 PCP: [name], FNP   Brief Narrative:  58 year old male with prior h/o COPD, tobacco abuse, arthritis presents with sob and neck swelling associated with weight loss . CT neck showed large laryngeal mass with air way compromise, admitted to the [facility] for further evaluation. ENT consulted, underwent tracheostomy and biopsy showing squamous cell carcinoma. Oncology consulted, recommendations were for laryngectomy but patient refused, chemotherapy and radiation therapy.  Radiation therapy starting next week. No plans for chemotherapy. SLP evaluation recommending soft diet with honey nectar liquids.   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  Plan to taper his steroids now that his wheezing has improved.  Oncology further suggests PEG placement as his radiation therapy is going to worsen his dysphagia. Pain control with tramadol and morphine for neck pain.   Acute COPD exacerbation   Continues to improve, with better air entry and improvement in wheezing.  Plan to transition to oral prednisone in the next 24 to 48 hours.  Resume duo nebs.   Severe Protein calorie malnutrition:  - nutrition on board, SLP on board. Meanwhile IR consulted to see if he will benefit from PEG for nutrition, as his radiation therapy might worsen his dysphagia.     Hypokalemia:  Replaced.    Hypertension:  Sub optimally controlled. Probably from neck mass pain.  Prn hydralazine. Add low dose norvasc.   Tobacco abuse  On Nicotine patch.  Constipation: on senna  and docusate   Mild anemia of chronic disease Monitor prn.    DVT prophylaxis: Lovenox.  Code Status: FULL CODE.  Family Communication: NONE AT BEDSIDE.  Disposition Plan: pending further evaluation by IR.   Consultants:   ENT  IR  Oncology  Radiation oncology.    Procedures: Tracheostomy  By Dr Janace Hoard on 8/10   Antimicrobials: none.    Subjective: Reports he is hurting at the neck and chest area.   Objective: Vitals:   08/10/19 0342 08/10/19 0500 08/10/19 0600 08/10/19 0603  BP:  (!) 181/97  (!) 166/88  Pulse:  (!) 59 72 70  Resp:  14 15 16   Temp: 98.2 F (36.8 C)     TempSrc: Oral     SpO2:  100% 98% 100%  Weight:      Height:        Intake/Output Summary (Last 24 hours) at 08/10/2019 0859 Last data filed at 08/10/2019 0400 Gross per 24 hour  Intake 2037.35 ml  Output 700 ml  Net 1337.35 ml   Filed Weights   08/06/19 0120  Weight: 47 kg    Examination:  General exam: reports he is in pain. Mild distress from neck pain. S/p trach on 28% oxygen.  Respiratory system: clear to auscultation, scatter wheezing heard.  Cardiovascular system: S1 & S2 heard, RRR. Gastrointestinal system: Abdomen is nondistended, soft and nontender. No organomegaly or masses felt. Normal bowel sounds heard. Central nervous system: Alert and oriented. Non focal Extremities: no leg edema.  Skin: No rashes, lesions or ulcers Psychiatry:  Mood okay.  Data Reviewed: I have personally reviewed following labs and imaging studies  CBC: Recent Labs  Lab 08/06/19 1522 08/07/19 0259 08/08/19 0325 08/09/19 0834 08/10/19 0231  WBC 7.0 8.1 7.8 11.0* 9.4  NEUTROABS 5.7 7.0 6.6 9.3* 6.7  HGB 12.0* 11.0* 11.0* 12.9* 11.5*  HCT 36.3* 34.1* 34.2* 40.9 36.6*  MCV 93.1 94.7 94.2 95.6 96.8  PLT 262 257 237 257 517   Basic Metabolic Panel: Recent Labs  Lab 08/06/19 0248 08/06/19 1522 08/07/19 0259 08/08/19 0325 08/09/19 0834 08/10/19 0231  NA  --  138 137 139 137 140    K  --  4.0 4.0 4.2 3.6 3.6  CL  --  105 101 104 100 103  CO2  --  23 24 27 28 29   GLUCOSE  --  170* 258* 166* 140* 147*  BUN  --  6 6 7 8 15   CREATININE  --  0.80 0.90 0.65 0.59* 0.75  CALCIUM  --  8.4* 8.4* 8.4* 8.4* 8.0*  MG 1.6*  --  1.6* 1.7  --  2.0   GFR: Estimated Creatinine Clearance: 66.9 mL/min (by C-G formula based on SCr of 0.75 mg/dL). Liver Function Tests: Recent Labs  Lab 08/05/19 1821  AST 25  ALT 13  ALKPHOS 66  BILITOT 0.7  PROT 6.8  ALBUMIN 3.1*   No results for input(s): LIPASE, AMYLASE in the last 168 hours. No results for input(s): AMMONIA in the last 168 hours. Coagulation Profile: Recent Labs  Lab 08/06/19 0248  INR 1.1   Cardiac Enzymes: No results for input(s): CKTOTAL, CKMB, CKMBINDEX, TROPONINI in the last 168 hours. BNP (last 3 results) No results for input(s): PROBNP in the last 8760 hours. HbA1C: No results for input(s): HGBA1C in the last 72 hours. CBG: Recent Labs  Lab 08/09/19 0803 08/09/19 1206 08/09/19 1700 08/09/19 2121 08/10/19 0813  GLUCAP 124* 112* 121* 142* 136*   Lipid Profile: No results for input(s): CHOL, HDL, LDLCALC, TRIG, CHOLHDL, LDLDIRECT in the last 72 hours. Thyroid Function Tests: No results for input(s): TSH, T4TOTAL, FREET4, T3FREE, THYROIDAB in the last 72 hours. Anemia Panel: No results for input(s): VITAMINB12, FOLATE, FERRITIN, TIBC, IRON, RETICCTPCT in the last 72 hours. Sepsis Labs: No results for input(s): PROCALCITON, LATICACIDVEN in the last 168 hours.  Recent Results (from the past 240 hour(s))  SARS Coronavirus 2 [facility] order, Performed in [facility] hospital lab) Nasopharyngeal Nasopharyngeal Swab     Status: None   Collection Time: 08/05/19  7:35 PM   Specimen: Nasopharyngeal Swab  Result Value Ref Range Status   SARS Coronavirus 2 NEGATIVE NEGATIVE Final    Comment: (NOTE) If result is NEGATIVE SARS-CoV-2 target nucleic acids are NOT DETECTED. The SARS-CoV-2 RNA is generally  detectable in upper and lower  respiratory specimens during the acute phase of infection. The lowest  concentration of SARS-CoV-2 viral copies this assay can detect is 250  copies / mL. A negative result does not preclude SARS-CoV-2 infection  and should not be used as the sole basis for treatment or other  patient management decisions.  A negative result may occur with  improper specimen collection / handling, submission of specimen other  than nasopharyngeal swab, presence of viral mutation(s) within the  areas targeted by this assay, and inadequate number of viral copies  (<250 copies / mL). A negative result must be combined with clinical  observations, patient history, and epidemiological information. If result is POSITIVE SARS-CoV-2 target nucleic acids are DETECTED. The SARS-CoV-2 RNA  is generally detectable in upper and lower  respiratory specimens dur ing the acute phase of infection.  Positive  results are indicative of active infection with SARS-CoV-2.  Clinical  correlation with patient history and other diagnostic information is  necessary to determine patient infection status.  Positive results do  not rule out bacterial infection or co-infection with other viruses. If result is PRESUMPTIVE POSTIVE SARS-CoV-2 nucleic acids MAY BE PRESENT.   A presumptive positive result was obtained on the submitted specimen  and confirmed on repeat testing.  While 2019 novel coronavirus  (SARS-CoV-2) nucleic acids may be present in the submitted sample  additional confirmatory testing may be necessary for epidemiological  and / or clinical management purposes  to differentiate between  SARS-CoV-2 and other Sarbecovirus currently known to infect humans.  If clinically indicated additional testing with an alternate test  methodology [id number]) is advised. The SARS-CoV-2 RNA is generally  detectable in upper and lower respiratory sp ecimens during the acute  phase of infection. The  expected result is Negative. Fact Sheet for Patients:  StrictlyIdeas.no Fact Sheet for Healthcare Providers: BankingDealers.co.za This test is not yet approved or cleared by the [place] FDA and has been authorized for detection and/or diagnosis of SARS-CoV-2 by FDA under an Emergency Use Authorization (EUA).  This EUA will remain in effect (meaning this test can be used) for the duration of the COVID-19 declaration under Section 564(b)(1) of the Act, 21 U.S.C. section 360bbb-3(b)(1), unless the authorization is terminated or revoked sooner. Performed at [facility], [place] [address] [place], [place] [address]   Respiratory Panel by PCR     Status: None   Collection Time: 08/06/19  1:48 AM   Specimen: Nasopharyngeal Swab; Respiratory  Result Value Ref Range Status   Adenovirus NOT DETECTED NOT DETECTED Final   Coronavirus 229E NOT DETECTED NOT DETECTED Final    Comment: (NOTE) The Coronavirus on the Respiratory Panel, DOES NOT test for the novel  Coronavirus (2019 nCoV)    Coronavirus HKU1 NOT DETECTED NOT DETECTED Final   Coronavirus NL63 NOT DETECTED NOT DETECTED Final   Coronavirus OC43 NOT DETECTED NOT DETECTED Final   Metapneumovirus NOT DETECTED NOT DETECTED Final   Rhinovirus / Enterovirus NOT DETECTED NOT DETECTED Final   Influenza A NOT DETECTED NOT DETECTED Final   Influenza B NOT DETECTED NOT DETECTED Final   Parainfluenza Virus 1 NOT DETECTED NOT DETECTED Final   Parainfluenza Virus 2 NOT DETECTED NOT DETECTED Final   Parainfluenza Virus 3 NOT DETECTED NOT DETECTED Final   Parainfluenza Virus 4 NOT DETECTED NOT DETECTED Final   Respiratory Syncytial Virus NOT DETECTED NOT DETECTED Final   Bordetella pertussis NOT DETECTED NOT DETECTED Final   Chlamydophila pneumoniae NOT DETECTED NOT DETECTED Final   Mycoplasma pneumoniae NOT DETECTED NOT DETECTED Final    Comment: Performed at [facility], [place].  [address], [place], [place] [address]  Surgical PCR screen     Status: None   Collection Time: 08/07/19 10:15 PM   Specimen: Nasal Mucosa; Nasal Swab  Result Value Ref Range Status   MRSA, PCR NEGATIVE NEGATIVE Final   Staphylococcus aureus NEGATIVE NEGATIVE Final    Comment: (NOTE) The Xpert SA Assay (FDA approved for NASAL specimens in patients 7 years of age and older), is one component of a comprehensive surveillance program. It is not intended to diagnose infection nor to guide or monitor treatment. Performed at [facility], [place],  [place] [address]          Radiology Studies: Ct Chest W Contrast  Result Date: 08/08/2019 CLINICAL DATA:  Supraglottic and glottic mass. Tracheostomy placement. EXAM: CT CHEST WITH CONTRAST TECHNIQUE: Multidetector CT imaging of the chest was performed during intravenous contrast administration. CONTRAST:  27mL OMNIPAQUE IOHEXOL 300 MG/ML  SOLN COMPARISON:  CT neck 08/05/2019 FINDINGS: Cardiovascular: Coronary artery calcification and aortic atherosclerotic calcification. Great vessels normal Mediastinum/Nodes: Interval placement of a tracheostomy tube with tip in the proximal trachea. There is gas within the tissue planes of the the thoracic inlet consistent with recent the procedure same day. Small LEFT paratracheal lymph node measures 8 mm. RIGHT hilar lymph node measures 10 mm (image 69/3). No supraclavicular adenopathy. No axillary adenopathy. No internal mammary adenopathy Lungs/Pleura: Severe centrilobular emphysema the upper lobes as well as paraseptal emphysema. No suspicious pulmonary nodules. Small RIGHT effusion. Small LEFT effusion additionally Upper Abdomen: Limited view of the liver, kidneys, pancreas are unremarkable. Normal adrenal glands. Musculoskeletal: No aggressive osseous lesion. IMPRESSION: 1. Interval placement of tracheostomy tube without complication. Gas within the tissue planes of the thoracic inlet consistent  with procedure same day. 2. Small mediastinal lymph nodes are upper limits of normal in measurement. PET-CT may add specificity as to malignant potential. 3. No suspicious pulmonary nodules. Paraseptal and centrilobular emphysema noted. Electronically Signed   By: Suzy Bouchard M.D.   On: 08/08/2019 15:32        Scheduled Meds:  Chlorhexidine Gluconate Cloth  6 each Topical Daily   enoxaparin (LOVENOX) injection  30 mg Subcutaneous Q24H   feeding supplement (ENSURE ENLIVE)  237 mL Oral TID   insulin aspart  0-9 Units Subcutaneous TID WC   ipratropium-albuterol  3 mL Nebulization BID   methylPREDNISolone (SOLU-MEDROL) injection  40 mg Intravenous Q12H   multivitamin with minerals  1 tablet Oral Daily   nicotine  21 mg Transdermal Daily   polyethylene glycol  17 g Oral Daily   Continuous Infusions:  sodium chloride 75 mL/hr at 08/10/19 0524     LOS: 5 days    Time spent: 32 minutes    Hosie Poisson, MD [facility] Pager [phone]   If 7PM-7AM, please contact night-coverage [email or web address] Password [facility] 08/10/2019, 8:59 AM

## 2019-08-10 NOTE — Progress Notes (Signed)
Pt. Endorses that he does not want to receive any surgical procedures. Order was placed IR to do PEG. Pt. Adamant he does not want procedure, even after explaining reasoning.  He also has declined the CT imaging for preparation for PEG tube placement. MD notified. We will further educate pt. but also respect his wishes. Will continue to monitor.

## 2019-08-11 ENCOUNTER — Inpatient Hospital Stay (HOSPITAL_COMMUNITY): Payer: Medicaid Other

## 2019-08-11 ENCOUNTER — Encounter (HOSPITAL_COMMUNITY): Payer: Self-pay | Admitting: Radiology

## 2019-08-11 DIAGNOSIS — Z7189 Other specified counseling: Secondary | ICD-10-CM

## 2019-08-11 LAB — BASIC METABOLIC PANEL
Anion gap: 6 (ref 5–15)
BUN: 8 mg/dL (ref 6–20)
CO2: 30 mmol/L (ref 22–32)
Calcium: 8 mg/dL — ABNORMAL LOW (ref 8.9–10.3)
Chloride: 102 mmol/L (ref 98–111)
Creatinine, Ser: 0.55 mg/dL — ABNORMAL LOW (ref 0.61–1.24)
GFR calc Af Amer: >60 mL/min (ref >60)
GFR calc non Af Amer: >60 mL/min (ref >60)
Glucose, Bld: 106 mg/dL — ABNORMAL HIGH (ref 70–99)
Potassium: 3.1 mmol/L — ABNORMAL LOW (ref 3.5–5.1)
Sodium: 138 mmol/L (ref 135–145)

## 2019-08-11 LAB — GLUCOSE, CAPILLARY
Glucose-Capillary: 93 mg/dL (ref 70–99)
Glucose-Capillary: 116 mg/dL — ABNORMAL HIGH (ref 70–99)
Glucose-Capillary: 197 mg/dL — ABNORMAL HIGH (ref 70–99)
Glucose-Capillary: 102 mg/dL — ABNORMAL HIGH (ref 70–99)

## 2019-08-11 LAB — CBC WITH DIFFERENTIAL/PLATELET
Abs Immature Granulocytes: 0.01 10*3/uL (ref 0.00–0.07)
Basophils Absolute: 0 10*3/uL (ref 0.0–0.1)
Basophils Relative: 0 %
Eosinophils Absolute: 0.1 10*3/uL (ref 0.0–0.5)
Eosinophils Relative: 1 %
HCT: 38.4 % — ABNORMAL LOW (ref 39.0–52.0)
Hemoglobin: 11.9 g/dL — ABNORMAL LOW (ref 13.0–17.0)
Immature Granulocytes: 0 %
Lymphocytes Relative: 27 %
Lymphs Abs: 1.7 10*3/uL (ref 0.7–4.0)
MCH: 29.7 pg (ref 26.0–34.0)
MCHC: 31 g/dL (ref 30.0–36.0)
MCV: 95.8 fL (ref 80.0–100.0)
Monocytes Absolute: 0.8 10*3/uL (ref 0.1–1.0)
Monocytes Relative: 12 %
Neutro Abs: 3.7 10*3/uL (ref 1.7–7.7)
Neutrophils Relative %: 60 %
Platelets: 238 10*3/uL (ref 150–400)
RBC: 4.01 MIL/uL — ABNORMAL LOW (ref 4.22–5.81)
RDW: 12.4 % (ref 11.5–15.5)
WBC: 6.2 10*3/uL (ref 4.0–10.5)
nRBC: 0 % (ref 0.0–0.2)

## 2019-08-11 MED ORDER — POTASSIUM CHLORIDE 10 MEQ/100ML IV SOLN
10.0000 meq | INTRAVENOUS | Status: AC
  Administered 2019-08-11 (×2): 10 meq via INTRAVENOUS
  Filled 2019-08-11 (×2): qty 100

## 2019-08-11 MED ORDER — ENOXAPARIN SODIUM 30 MG/0.3ML ~~LOC~~ SOLN
30.0000 mg | SUBCUTANEOUS | Status: DC
  Administered 2019-08-13 – 2019-08-16 (×4): 30 mg via SUBCUTANEOUS
  Filled 2019-08-11 (×4): qty 0.3

## 2019-08-11 NOTE — Progress Notes (Signed)
Placed PT on new ATC set up- uneventful. Replaced inner cannula, replaced drain sponge, cleaned trach flange, PT refused suctioning at this time.
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
## 2019-08-11 NOTE — Progress Notes (Signed)
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, I do not think that he is a good candidate for chemotherapy concurrent with radiation -I agree with proceeding with radiation alone -I informed the patient that as he will not be receiving chemotherapy, radiation oncology will be guiding his care  Severe malnutrition -Likely multifactorial, including advanced laryngeal cancer, history of cocaine and EtOH abuse, and prolonged homelessness -Patient initially declined feeding tube -I discussed with patient some of the benefits of feeding tube, including maintaining nutrition and hydration while receiving radiation treatment, which significantly increases risk of mucositis, further weight loss and dehydration -Patient expressed understanding, and will think about it more before making a decision  Goals of care  -I discussed with the patient's sister that while CT did not show any evidence of definite metastatic disease, he is at a high risk for occult metastases due to the very locally advanced disease -While it is still theoretically possible to achieve cure with radiation alone in the absence of metastatic disease, the chance of cure is very small -Patient has mentioned to the nursing staff several times that he did not wish to pursue aggressive care -Therefore, I think it is reasonable to consider palliative care consult to further define goals of care  Thank you for the consult.  Oncology will sign off.  Please feel free to contact us if there are any questions.  Tish Men, MD 08/11/2019  5:55 PM  Subjective:  Perry Waller is nonverbal due to the tracheostomy,  but is able to write down his symptoms.  He reports that he has persistent coughing with mostly white secretions, as well as some discomfort in the throat.  He asks for a longer tubing to [place] so that he can reach his tracheostomy easier.  He denies any other complaint this afternoon.  ROS: Constitutional: ( - ) fevers, ( - )  chills , ( - ) night sweats Ears, nose, mouth, throat, and face: ( - ) mucositis, ( + ) sore throat Respiratory: ( + ) cough, ( + ) dyspnea, ( - ) wheezes Cardiovascular: ( - ) palpitation, ( - ) chest discomfort, ( - ) lower extremity swelling Gastrointestinal:  ( - ) nausea, ( - ) heartburn, ( - ) change in bowel habits Skin: ( - ) abnormal skin rashes Behavioral/Psych: ( - ) mood change, ( - ) new changes  All other systems were reviewed with the patient and are negative.  Objective:  Vitals:   08/11/19 1157 08/11/19 1535  BP:    Pulse: 77 76  Resp: 18   Temp:    SpO2: 100% 98%     Intake/Output Summary (Last 24 hours) at 08/11/2019 1755 Last data filed at 08/11/2019 1425 Gross per 24 hour  Intake -  Output 1300 ml  Net -1300 ml    GENERAL: alert, no distress, frail appearing, older than stated age  SKIN: skin color, texture, turgor are normal, no rashes or significant lesions EYES: conjunctiva are pink and non-injected, sclera clear NECK: thin, tracheostomy in place  LUNGS: decreased air movement bilaterally on anterior auscultation, some upper respiratory rattling from secretions  HEART: regular rate & rhythm  and no murmurs and no lower extremity edema ABDOMEN: soft, non-tender, non-distended, normal bowel sounds PSYCH: flat affect    Labs:  Lab Results  Component Value Date   WBC 6.2 08/11/2019   HGB 11.9 (L) 08/11/2019   HCT 38.4 (L) 08/11/2019   MCV 95.8 08/11/2019   PLT 238 08/11/2019   NEUTROABS 3.7 08/11/2019    Lab Results  Component Value Date   NA 138 08/11/2019   K 3.1 (L) 08/11/2019   CL 102 08/11/2019   CO2 30 08/11/2019     Studies:  Ct Abdomen Wo Contrast  Result Date: 08/11/2019 CLINICAL DATA:  Laryngeal mass with airway narrowing, regional malignant adenopathy, now post tracheostomy placement. Preop planning for gastrostomy tube EXAM: CT ABDOMEN WITHOUT CONTRAST TECHNIQUE: Multidetector CT imaging of the abdomen was performed following the standard protocol without IV contrast. COMPARISON:  10/17/2011 FINDINGS: Lower chest: Small bilateral pleural effusions. Consolidation/atelectasis posteriorly in both lower lobes. Coronary calcifications. Hepatobiliary: No focal liver abnormality is seen. No gallstones, gallbladder Mcconahy thickening, or biliary dilatation. Pancreas: Unremarkable. No pancreatic ductal dilatation or surrounding inflammatory changes. Spleen: Normal in size without focal abnormality. Adrenals/Urinary Tract: Adrenal glands are unremarkable. Kidneys are normal, without renal calculi, focal lesion, or hydronephrosis. Stomach/Bowel: Stomach is incompletely distended. There is a safe percutaneous window for gastrostomy placement. Visualized small bowel decompressed. Residual oral contrast in the visualized colon which is nondilated. Vascular/Lymphatic: Ectatic abdominal aorta up to 2.5 cm diameter with coarse atheromatous calcifications. No evident abdominal or mesenteric adenopathy. Other: No ascites.  No free air. Musculoskeletal: Spondylitic changes in the lower lumbar spine. No fracture or worrisome bone lesion. IMPRESSION: 1. Safe percutaneous approach for gastrostomy placement identified. 2. Small bilateral pleural effusions with consolidation/atelectasis in the lower lobes. 3. Coronary and aortoiliac  atherosclerosis (ICD10-170.0). 4. Ectatic abdominal aorta at risk for aneurysm development. Recommend followup by ultrasound in 5 years. This recommendation follows ACR consensus guidelines: White Paper of the ACR Incidental Findings Committee II on Vascular Findings. J Am Coll Radiol 2013; 10:789-794.  Electronically Signed   By: Lucrezia Europe M.D.   On: 08/11/2019 09:30

## 2019-08-11 NOTE — Progress Notes (Signed)
PROGRESS NOTE    Perry Waller  QPR:[id number] DOB: June 01, 1961 DOA: 08/05/2019 PCP: [name], FNP   Brief Narrative:  58 year old male with prior h/o COPD, tobacco abuse, arthritis presents with sob and neck swelling associated with weight loss . CT neck showed large laryngeal mass with air way compromise, admitted to the [facility] for further evaluation. ENT consulted, underwent tracheostomy and biopsy showing squamous cell carcinoma. Oncology consulted, recommendations were for laryngectomy but patient refused, chemotherapy and radiation therapy.  Radiation therapy starting next week. No plans for chemotherapy. SLP evaluation recommending soft diet with honey nectar liquids.   Meanwhile as per oncology recommendations,  IR consulted for PEG placement,.  8/13 PT seen and examined today. He has cough and increased secretions. initially refused PEG placement but later on agreed.       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  Radiation treatments to start next week.  Continue with IV solumedrol as he has wheezing.  Oncology further suggests PEG placement as his radiation therapy is going to worsen his dysphagia. Patient initially refused CT aabd, but agreed to get it done today.  Pain control with tramadol and morphine for neck pain.   Acute COPD exacerbation   Persistent wheezing and rhonchi on exam today. Would recommend to continue with IV solumedrol for another 24 hours if his wheezing improves, Plan to transition to oral prednisone in the next 24 to 48 hours.  Resume duo nebs.   Severe Protein calorie malnutrition:  - nutrition on  board, SLP on board. Meanwhile IR consulted to see if he will benefit from PEG for nutrition, as his radiation therapy might worsen his dysphagia.  Plan for PEG tomorrow.     Hypokalemia:  Replaced. Repeat in am. Check magnesium levels.    Hypertension:  Better controlled than yesterday. On norvasc. Will increase the dose of norvasc to 10 mg if he continues to remain hypertensive.   Tobacco abuse  On Nicotine patch.  Constipation: on senna and docusate , will add prn miralax.   Mild anemia of chronic disease Hemoglobin stable between 11 to 12.  No signs of bleeding from the trach site.     DVT prophylaxis: Lovenox.  Code Status: FULL CODE.  Family Communication: NONE AT BEDSIDE.  Disposition Plan: pending further evaluation by IR.   Consultants:   ENT  IR  Oncology  Radiation oncology.    Procedures: Tracheostomy  By Dr Janace Hoard on 8/10   Antimicrobials: none.    Subjective: Has increased secretions from the trach site.  Continues to cough.  He initially did not want any PEG tube. But later on agreed.    Objective: Vitals:   08/11/19 0609 08/11/19 0844 08/11/19 1157 08/11/19 1535  BP: (!) 136/94     Pulse: 85 68 77 76  Resp: 18 18 18    Temp: 98.7 F (37.1 C)     TempSrc: Oral     SpO2: 100% 100% 100% 98%  Weight:      Height:        Intake/Output Summary (Last 24 hours) at 08/11/2019 1827 Last data filed at 08/11/2019  1425 Gross per 24 hour  Intake -  Output 1300 ml  Net -1300 ml   Filed Weights   08/06/19 0120  Weight: 47 kg    Examination:  General exam:  Cachetic looking gentleman. Alert and not in distress. S/p trach on 28% of oxygen.  Respiratory system: rhonchi and wheezing heard both anteriorly and posteriorly.  Cardiovascular system: s1s2, RRR, no pedal edema.  Gastrointestinal system: Abdomen is soft NT nd bs+ Central nervous system: Alert and oriented. Non focal Extremities: no leg edema. No cyanosis or clubbing.  Skin: No  rashes, lesions or ulcers Psychiatry:  Flat affect.     Data Reviewed: I have personally reviewed following labs and imaging studies  CBC: Recent Labs  Lab 08/07/19 0259 08/08/19 0325 08/09/19 0834 08/10/19 0231 08/11/19 0543  WBC 8.1 7.8 11.0* 9.4 6.2  NEUTROABS 7.0 6.6 9.3* 6.7 3.7  HGB 11.0* 11.0* 12.9* 11.5* 11.9*  HCT 34.1* 34.2* 40.9 36.6* 38.4*  MCV 94.7 94.2 95.6 96.8 95.8  PLT 257 237 257 254 130   Basic Metabolic Panel: Recent Labs  Lab 08/06/19 0248  08/07/19 0259 08/08/19 0325 08/09/19 0834 08/10/19 0231 08/11/19 0543  NA  --    < > 137 139 137 140 138  K  --    < > 4.0 4.2 3.6 3.6 3.1*  CL  --    < > 101 104 100 103 102  CO2  --    < > 24 27 28 29 30   GLUCOSE  --    < > 258* 166* 140* 147* 106*  BUN  --    < > 6 7 8 15 8   CREATININE  --    < > 0.90 0.65 0.59* 0.75 0.55*  CALCIUM  --    < > 8.4* 8.4* 8.4* 8.0* 8.0*  MG 1.6*  --  1.6* 1.7  --  2.0  --    < > = values in this interval not displayed.   GFR: Estimated Creatinine Clearance: 66.9 mL/min (A) (by C-G formula based on SCr of 0.55 mg/dL (L)). Liver Function Tests: Recent Labs  Lab 08/05/19 1821  AST 25  ALT 13  ALKPHOS 66  BILITOT 0.7  PROT 6.8  ALBUMIN 3.1*   No results for input(s): LIPASE, AMYLASE in the last 168 hours. No results for input(s): AMMONIA in the last 168 hours. Coagulation Profile: Recent Labs  Lab 08/06/19 0248  INR 1.1   Cardiac Enzymes: No results for input(s): CKTOTAL, CKMB, CKMBINDEX, TROPONINI in the last 168 hours. BNP (last 3 results) No results for input(s): PROBNP in the last 8760 hours. HbA1C: No results for input(s): HGBA1C in the last 72 hours. CBG: Recent Labs  Lab 08/10/19 1628 08/10/19 2231 08/11/19 0743 08/11/19 1141 08/11/19 1611  GLUCAP 122* 97 93 116* 197*   Lipid Profile: No results for input(s): CHOL, HDL, LDLCALC, TRIG, CHOLHDL, LDLDIRECT in the last 72 hours. Thyroid Function Tests: No results for input(s): TSH, T4TOTAL,  FREET4, T3FREE, THYROIDAB in the last 72 hours. Anemia Panel: No results for input(s): VITAMINB12, FOLATE, FERRITIN, TIBC, IRON, RETICCTPCT in the last 72 hours. Sepsis Labs: No results for input(s): PROCALCITON, LATICACIDVEN in the last 168 hours.  Recent Results (from the past 240 hour(s))  SARS Coronavirus 2 [facility] order, Performed in [facility] hospital lab) Nasopharyngeal Nasopharyngeal Swab     Status: None   Collection Time: 08/05/19  7:35 PM   Specimen: Nasopharyngeal Swab  Result Value Ref Range Status  SARS Coronavirus 2 NEGATIVE NEGATIVE Final    Comment: (NOTE) If result is NEGATIVE SARS-CoV-2 target nucleic acids are NOT DETECTED. The SARS-CoV-2 RNA is generally detectable in upper and lower  respiratory specimens during the acute phase of infection. The lowest  concentration of SARS-CoV-2 viral copies this assay can detect is 250  copies / mL. A negative result does not preclude SARS-CoV-2 infection  and should not be used as the sole basis for treatment or other  patient management decisions.  A negative result may occur with  improper specimen collection / handling, submission of specimen other  than nasopharyngeal swab, presence of viral mutation(s) within the  areas targeted by this assay, and inadequate number of viral copies  (<250 copies / mL). A negative result must be combined with clinical  observations, patient history, and epidemiological information. If result is POSITIVE SARS-CoV-2 target nucleic acids are DETECTED. The SARS-CoV-2 RNA is generally detectable in upper and lower  respiratory specimens dur ing the acute phase of infection.  Positive  results are indicative of active infection with SARS-CoV-2.  Clinical  correlation with patient history and other diagnostic information is  necessary to determine patient infection status.  Positive results do  not rule out bacterial infection or co-infection with other viruses. If result is PRESUMPTIVE  POSTIVE SARS-CoV-2 nucleic acids MAY BE PRESENT.   A presumptive positive result was obtained on the submitted specimen  and confirmed on repeat testing.  While 2019 novel coronavirus  (SARS-CoV-2) nucleic acids may be present in the submitted sample  additional confirmatory testing may be necessary for epidemiological  and / or clinical management purposes  to differentiate between  SARS-CoV-2 and other Sarbecovirus currently known to infect humans.  If clinically indicated additional testing with an alternate test  methodology [phone]) is advised. The SARS-CoV-2 RNA is generally  detectable in upper and lower respiratory sp ecimens during the acute  phase of infection. The expected result is Negative. Fact Sheet for Patients:  StrictlyIdeas.no Fact Sheet for Healthcare Providers: BankingDealers.co.za This test is not yet approved or cleared by the [place] FDA and has been authorized for detection and/or diagnosis of SARS-CoV-2 by FDA under an Emergency Use Authorization (EUA).  This EUA will remain in effect (meaning this test can be used) for the duration of the COVID-19 declaration under Section 564(b)(1) of the Act, 21 U.S.C. section 360bbb-3(b)(1), unless the authorization is terminated or revoked sooner. Performed at [facility], [place] [address] [name], [place] [address]   Respiratory Panel by PCR     Status: None   Collection Time: 08/06/19  1:48 AM   Specimen: Nasopharyngeal Swab; Respiratory  Result Value Ref Range Status   Adenovirus NOT DETECTED NOT DETECTED Final   Coronavirus 229E NOT DETECTED NOT DETECTED Final    Comment: (NOTE) The Coronavirus on the Respiratory Panel, DOES NOT test for the novel  Coronavirus (2019 nCoV)    Coronavirus HKU1 NOT DETECTED NOT DETECTED Final   Coronavirus NL63 NOT DETECTED NOT DETECTED Final   Coronavirus OC43 NOT DETECTED NOT DETECTED Final   Metapneumovirus NOT  DETECTED NOT DETECTED Final   Rhinovirus / Enterovirus NOT DETECTED NOT DETECTED Final   Influenza A NOT DETECTED NOT DETECTED Final   Influenza B NOT DETECTED NOT DETECTED Final   Parainfluenza Virus 1 NOT DETECTED NOT DETECTED Final   Parainfluenza Virus 2 NOT DETECTED NOT DETECTED Final   Parainfluenza Virus 3 NOT DETECTED NOT DETECTED Final   Parainfluenza Virus 4  NOT DETECTED NOT DETECTED Final   Respiratory Syncytial Virus NOT DETECTED NOT DETECTED Final   Bordetella pertussis NOT DETECTED NOT DETECTED Final   Chlamydophila pneumoniae NOT DETECTED NOT DETECTED Final   Mycoplasma pneumoniae NOT DETECTED NOT DETECTED Final    Comment: Performed at [facility], [place] [address], [place], [place] [address]  Surgical PCR screen     Status: None   Collection Time: 08/07/19 10:15 PM   Specimen: Nasal Mucosa; Nasal Swab  Result Value Ref Range Status   MRSA, PCR NEGATIVE NEGATIVE Final   Staphylococcus aureus NEGATIVE NEGATIVE Final    Comment: (NOTE) The Xpert SA Assay (FDA approved for NASAL specimens in patients 53 years of age and older), is one component of a comprehensive surveillance program. It is not intended to diagnose infection nor to guide or monitor treatment. Performed at [facility], [place] [address] [place], [place] [address]          Radiology Studies: Ct Abdomen Wo Contrast  Result Date: 08/11/2019 CLINICAL DATA:  Laryngeal mass with airway narrowing, regional malignant adenopathy, now post tracheostomy placement. Preop planning for gastrostomy tube EXAM: CT ABDOMEN WITHOUT CONTRAST TECHNIQUE: Multidetector CT imaging of the abdomen was performed following the standard protocol without IV contrast. COMPARISON:  10/17/2011 FINDINGS: Lower chest: Small bilateral pleural effusions. Consolidation/atelectasis posteriorly in both lower lobes. Coronary calcifications. Hepatobiliary: No focal liver abnormality is seen. No gallstones, gallbladder Fore  thickening, or biliary dilatation. Pancreas: Unremarkable. No pancreatic ductal dilatation or surrounding inflammatory changes. Spleen: Normal in size without focal abnormality. Adrenals/Urinary Tract: Adrenal glands are unremarkable. Kidneys are normal, without renal calculi, focal lesion, or hydronephrosis. Stomach/Bowel: Stomach is incompletely distended. There is a safe percutaneous window for gastrostomy placement. Visualized small bowel decompressed. Residual oral contrast in the visualized colon which is nondilated. Vascular/Lymphatic: Ectatic abdominal aorta up to 2.5 cm diameter with coarse atheromatous calcifications. No evident abdominal or mesenteric adenopathy. Other: No ascites.  No free air. Musculoskeletal: Spondylitic changes in the lower lumbar spine. No fracture or worrisome bone lesion. IMPRESSION: 1. Safe percutaneous approach for gastrostomy placement identified. 2. Small bilateral pleural effusions with consolidation/atelectasis in the lower lobes. 3. Coronary and aortoiliac  atherosclerosis (ICD10-170.0). 4. Ectatic abdominal aorta at risk for aneurysm development. Recommend followup by ultrasound in 5 years. This recommendation follows ACR consensus guidelines: White Paper of the ACR Incidental Findings Committee II on Vascular Findings. J Am Coll Radiol 2013; 10:789-794. Electronically Signed   By: Lucrezia Europe M.D.   On: 08/11/2019 09:30        Scheduled Meds: . amLODipine  5 mg Oral Daily  . Chlorhexidine Gluconate Cloth  6 each Topical Daily  . [START ON 08/13/2019] enoxaparin (LOVENOX) injection  30 mg Subcutaneous Q24H  . feeding supplement (ENSURE ENLIVE)  237 mL Oral TID  . insulin aspart  0-9 Units Subcutaneous TID WC  . methylPREDNISolone (SOLU-MEDROL) injection  60 mg Intravenous Daily  . multivitamin with minerals  1 tablet Oral Daily  . nicotine  21 mg Transdermal Daily  . polyethylene glycol  17 g Oral Daily   Continuous Infusions: . sodium chloride 75 mL/hr at  08/10/19 1750     LOS: 6 days    Time spent: 36 minutes    Hosie Poisson, MD [facility] Pager [phone]   If 7PM-7AM, please contact night-coverage [email or web address] Password [facility] 08/11/2019, 6:27 PM

## 2019-08-12 ENCOUNTER — Encounter (HOSPITAL_COMMUNITY): Payer: Self-pay | Admitting: Interventional Radiology

## 2019-08-12 ENCOUNTER — Inpatient Hospital Stay (HOSPITAL_COMMUNITY): Payer: Medicaid Other

## 2019-08-12 HISTORY — PX: IR GASTROSTOMY TUBE MOD SED: IMG625

## 2019-08-12 LAB — CBC WITH DIFFERENTIAL/PLATELET
Abs Immature Granulocytes: 0.02 10*3/uL (ref 0.00–0.07)
Basophils Absolute: 0 10*3/uL (ref 0.0–0.1)
Basophils Relative: 0 %
Eosinophils Absolute: 0.1 10*3/uL (ref 0.0–0.5)
Eosinophils Relative: 1 %
HCT: 40.6 % (ref 39.0–52.0)
Hemoglobin: 12.5 g/dL — ABNORMAL LOW (ref 13.0–17.0)
Immature Granulocytes: 0 %
Lymphocytes Relative: 28 %
Lymphs Abs: 1.9 10*3/uL (ref 0.7–4.0)
MCH: 29.8 pg (ref 26.0–34.0)
MCHC: 30.8 g/dL (ref 30.0–36.0)
MCV: 96.9 fL (ref 80.0–100.0)
Monocytes Absolute: 0.9 10*3/uL (ref 0.1–1.0)
Monocytes Relative: 13 %
Neutro Abs: 3.9 10*3/uL (ref 1.7–7.7)
Neutrophils Relative %: 58 %
Platelets: 272 10*3/uL (ref 150–400)
RBC: 4.19 MIL/uL — ABNORMAL LOW (ref 4.22–5.81)
RDW: 12.3 % (ref 11.5–15.5)
WBC: 6.8 10*3/uL (ref 4.0–10.5)
nRBC: 0 % (ref 0.0–0.2)

## 2019-08-12 LAB — SARS CORONAVIRUS 2 (TAT 6-24 HRS): SARS Coronavirus 2: NEGATIVE

## 2019-08-12 LAB — BASIC METABOLIC PANEL
Anion gap: 10 (ref 5–15)
BUN: 9 mg/dL (ref 6–20)
CO2: 30 mmol/L (ref 22–32)
Calcium: 8.2 mg/dL — ABNORMAL LOW (ref 8.9–10.3)
Chloride: 99 mmol/L (ref 98–111)
Creatinine, Ser: 0.54 mg/dL — ABNORMAL LOW (ref 0.61–1.24)
GFR calc Af Amer: >60 mL/min (ref >60)
GFR calc non Af Amer: >60 mL/min (ref >60)
Glucose, Bld: 89 mg/dL (ref 70–99)
Potassium: 3.1 mmol/L — ABNORMAL LOW (ref 3.5–5.1)
Sodium: 139 mmol/L (ref 135–145)

## 2019-08-12 LAB — GLUCOSE, CAPILLARY
Glucose-Capillary: 104 mg/dL — ABNORMAL HIGH (ref 70–99)
Glucose-Capillary: 154 mg/dL — ABNORMAL HIGH (ref 70–99)

## 2019-08-12 LAB — PROTIME-INR
INR: 0.9 (ref 0.8–1.2)
Prothrombin Time: 12.3 seconds (ref 11.4–15.2)

## 2019-08-12 LAB — MAGNESIUM: Magnesium: 2 mg/dL (ref 1.7–2.4)

## 2019-08-12 MED ORDER — AMLODIPINE BESYLATE 10 MG PO TABS
10.0000 mg | ORAL_TABLET | Freq: Every day | ORAL | Status: DC
  Administered 2019-08-13 – 2019-08-17 (×5): 10 mg via ORAL
  Filled 2019-08-12 (×5): qty 1

## 2019-08-12 MED ORDER — CEFAZOLIN SODIUM-DEXTROSE 2-4 GM/100ML-% IV SOLN
2.0000 g | INTRAVENOUS | Status: AC
  Administered 2019-08-12: 2 g via INTRAVENOUS
  Filled 2019-08-12: qty 100

## 2019-08-12 MED ORDER — IOHEXOL 300 MG/ML  SOLN
50.0000 mL | Freq: Once | INTRAMUSCULAR | Status: AC | PRN
  Administered 2019-08-12: 15 mL

## 2019-08-12 MED ORDER — PREDNISONE 20 MG PO TABS
40.0000 mg | ORAL_TABLET | Freq: Every day | ORAL | Status: AC
  Administered 2019-08-13 – 2019-08-15 (×3): 40 mg via ORAL
  Filled 2019-08-12 (×3): qty 2

## 2019-08-12 MED ORDER — FENTANYL CITRATE (PF) 100 MCG/2ML IJ SOLN
INTRAMUSCULAR | Status: AC | PRN
  Administered 2019-08-12: 50 ug via INTRAVENOUS

## 2019-08-12 MED ORDER — MIDAZOLAM HCL 2 MG/2ML IJ SOLN
INTRAMUSCULAR | Status: AC | PRN
  Administered 2019-08-12: 1 mg via INTRAVENOUS

## 2019-08-12 MED ORDER — BACITRACIN-NEOMYCIN-POLYMYXIN 400-5-5000 EX OINT
1.0000 "application " | TOPICAL_OINTMENT | Freq: Every day | CUTANEOUS | Status: DC
  Administered 2019-08-15 – 2019-08-17 (×2): 1 via TOPICAL
  Filled 2019-08-12: qty 1

## 2019-08-12 MED ORDER — MIDAZOLAM HCL 2 MG/2ML IJ SOLN
INTRAMUSCULAR | Status: AC
  Filled 2019-08-12: qty 2

## 2019-08-12 MED ORDER — CEFAZOLIN SODIUM-DEXTROSE 2-4 GM/100ML-% IV SOLN
INTRAVENOUS | Status: AC
  Filled 2019-08-12: qty 100

## 2019-08-12 MED ORDER — FENTANYL CITRATE (PF) 100 MCG/2ML IJ SOLN
INTRAMUSCULAR | Status: AC
  Filled 2019-08-12: qty 2

## 2019-08-12 MED ORDER — LIDOCAINE HCL 1 % IJ SOLN
INTRAMUSCULAR | Status: AC
  Filled 2019-08-12: qty 20

## 2019-08-12 NOTE — Procedures (Signed)
Interventional Radiology Procedure Note  Procedure: Placement of percutaneous 20F pull-through gastrostomy tube. Complications: None Recommendations: - NPO except for sips and chips remainder of today and overnight - Maintain G-tube to LWS until tomorrow morning  - May advance diet as tolerated and begin using tube tomorrow morning  Signed,   Onyekachi Gathright S. [place] Hesser, DO   

## 2019-08-12 NOTE — Progress Notes (Signed)
PT denies need for suctioning at this time- PT does not appear to be in respiratory distress at this time.

## 2019-08-12 NOTE — Progress Notes (Signed)
PROGRESS NOTE    Perry Waller  YQM:[id number] DOB: 01/31/1961 DOA: 08/05/2019 PCP: [name], FNP   Brief Narrative:  58 year old male with prior h/o COPD, tobacco abuse, arthritis presents with sob and neck swelling associated with weight loss . CT neck showed large laryngeal mass with air way compromise, admitted to the [facility] for further evaluation. ENT consulted, underwent tracheostomy and biopsy showing squamous cell carcinoma. Oncology consulted, recommendations were for laryngectomy but patient refused, chemotherapy and radiation therapy.  Radiation therapy starting next week. No plans for chemotherapy. SLP evaluation recommending soft diet with honey nectar liquids.   Meanwhile as per oncology recommendations,  IR consulted for PEG placement,.  8/14 PT seen and examined today.continues to have increased secretions. Underwent PEG placement today.    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  Radiation treatments to start next week if patient is agreeable.  Oncology further suggests PEG placement as his radiation therapy is going to worsen his dysphagia. PEG placed today.  Pain control with tramadol and morphine for neck pain.   Acute COPD exacerbation   Wheezing has improved, basilar rhonchi persistent.  Transition IV solumedrol to oral prednisone. Resume duo nebs.   Severe Protein calorie malnutrition:  - nutrition on board, SLP on board. Meanwhile IR consulted to see if he will benefit from PEG for nutrition, as his radiation therapy might worsen his dysphagia.  Underwent PEG placement today.      Hypokalemia:   Replaced.   Hypertension:  Slightly elevated today. Increase norvasc to 10 mg daily.   Tobacco abuse  On Nicotine patch.  Constipation: resume senna, colace and miralax. .   Mild anemia of chronic disease Hemoglobin stable between 11 to 12.  No signs of bleeding from the trach site.     DVT prophylaxis: Lovenox.  Code Status: FULL CODE.  Family Communication: NONE AT BEDSIDE.  Disposition Plan: pending palliative care consult.   Consultants:   ENT  IR  Oncology  Radiation oncology.    Procedures: Tracheostomy  By Dr Janace Hoard on 8/10   Antimicrobials: none.    Subjective: Reports he has some cough.  Underwent PEG today.  No nausea, vomiting or abdominal pain.     Objective: Vitals:   08/12/19 1105 08/12/19 1116 08/12/19 1146 08/12/19 1357  BP: (!) 161/108 (!) 163/111  (!) 149/103  Pulse: 74 80 87 83  Resp: 15 17 19 18   Temp:    98.1 F (36.7 C)  TempSrc:      SpO2: 97% 99% 97% 98%  Weight:      Height:        Intake/Output Summary (Last 24 hours) at 08/12/2019 1453 Last data filed at 08/12/2019 1015 Gross per 24 hour  Intake --  Output 1550 ml  Net -1550 ml   Filed Weights   08/06/19 0120  Weight: 47 kg    Examination:  General exam:  Cachetic looking gentleman.  S/p trach on 28% of oxygen. Alert and not in distress.  Respiratory system: scattered wheezing, rhonchi at bases.  Cardiovascular system: S1S2, RRR, no pedal edema.  Gastrointestinal system: abd is soft  NT ND BS+ Central nervous system: ALERT and answering questions appropriately.  Extremities; no leg edema.  Skin: No rashes, lesions or ulcers Psychiatry:  Flat affect.     Data Reviewed: I have personally reviewed following labs and imaging studies  CBC: Recent Labs  Lab 08/08/19 0325 08/09/19 0834 08/10/19 0231 08/11/19 0543 08/12/19 0949  WBC 7.8 11.0* 9.4 6.2 6.8  NEUTROABS 6.6 9.3* 6.7 3.7 3.9  HGB 11.0* 12.9* 11.5* 11.9* 12.5*  HCT 34.2* 40.9 36.6* 38.4* 40.6   MCV 94.2 95.6 96.8 95.8 96.9  PLT 237 257 254 238 160   Basic Metabolic Panel: Recent Labs  Lab 08/06/19 0248  08/07/19 0259 08/08/19 0325 08/09/19 0834 08/10/19 0231 08/11/19 0543 08/12/19 0949  NA  --    < > 137 139 137 140 138 139  K  --    < > 4.0 4.2 3.6 3.6 3.1* 3.1*  CL  --    < > 101 104 100 103 102 99  CO2  --    < > 24 27 28 29 30 30   GLUCOSE  --    < > 258* 166* 140* 147* 106* 89  BUN  --    < > 6 7 8 15 8 9   CREATININE  --    < > 0.90 0.65 0.59* 0.75 0.55* 0.54*  CALCIUM  --    < > 8.4* 8.4* 8.4* 8.0* 8.0* 8.2*  MG 1.6*  --  1.6* 1.7  --  2.0  --  2.0   < > = values in this interval not displayed.   GFR: Estimated Creatinine Clearance: 66.9 mL/min (A) (by C-G formula based on SCr of 0.54 mg/dL (L)). Liver Function Tests: Recent Labs  Lab 08/05/19 1821  AST 25  ALT 13  ALKPHOS 66  BILITOT 0.7  PROT 6.8  ALBUMIN 3.1*   No results for input(s): LIPASE, AMYLASE in the last 168 hours. No results for input(s): AMMONIA in the last 168 hours. Coagulation Profile: Recent Labs  Lab 08/06/19 0248 08/12/19 0949  INR 1.1 0.9   Cardiac Enzymes: No results for input(s): CKTOTAL, CKMB, CKMBINDEX, TROPONINI in the last 168 hours. BNP (last 3 results) No results for input(s): PROBNP in the last 8760 hours. HbA1C: No results for input(s): HGBA1C in the last 72 hours. CBG: Recent Labs  Lab 08/11/19 0743 08/11/19 1141 08/11/19 1611 08/11/19 2131 08/12/19 0754  GLUCAP 93 116* 197* 102* 104*   Lipid Profile: No results for input(s): CHOL, HDL, LDLCALC, TRIG, CHOLHDL, LDLDIRECT in the last 72 hours. Thyroid Function Tests: No results for input(s): TSH, T4TOTAL, FREET4, T3FREE, THYROIDAB in the last 72 hours. Anemia Panel: No results for input(s): VITAMINB12, FOLATE, FERRITIN, TIBC, IRON, RETICCTPCT in the last 72 hours. Sepsis Labs: No results for input(s): PROCALCITON, LATICACIDVEN in the last 168 hours.  Recent Results (from the past 240 hour(s))  SARS  Coronavirus 2 [facility] order, Performed in [facility] hospital lab) Nasopharyngeal Nasopharyngeal Swab     Status: None   Collection Time: 08/05/19  7:35 PM   Specimen: Nasopharyngeal Swab  Result Value Ref Range Status   SARS Coronavirus 2 NEGATIVE NEGATIVE Final    Comment: (NOTE) If result is NEGATIVE SARS-CoV-2 target nucleic acids are NOT DETECTED. The SARS-CoV-2 RNA is generally detectable in upper and lower  respiratory specimens during the acute phase of infection. The lowest  concentration of SARS-CoV-2 viral copies this assay can detect is 250  copies / mL. A negative result does not  preclude SARS-CoV-2 infection  and should not be used as the sole basis for treatment or other  patient management decisions.  A negative result may occur with  improper specimen collection / handling, submission of specimen other  than nasopharyngeal swab, presence of viral mutation(s) within the  areas targeted by this assay, and inadequate number of viral copies  (<250 copies / mL). A negative result must be combined with clinical  observations, patient history, and epidemiological information. If result is POSITIVE SARS-CoV-2 target nucleic acids are DETECTED. The SARS-CoV-2 RNA is generally detectable in upper and lower  respiratory specimens dur ing the acute phase of infection.  Positive  results are indicative of active infection with SARS-CoV-2.  Clinical  correlation with patient history and other diagnostic information is  necessary to determine patient infection status.  Positive results do  not rule out bacterial infection or co-infection with other viruses. If result is PRESUMPTIVE POSTIVE SARS-CoV-2 nucleic acids MAY BE PRESENT.   A presumptive positive result was obtained on the submitted specimen  and confirmed on repeat testing.  While 2019 novel coronavirus  (SARS-CoV-2) nucleic acids may be present in the submitted sample  additional confirmatory testing may be necessary  for epidemiological  and / or clinical management purposes  to differentiate between  SARS-CoV-2 and other Sarbecovirus currently known to infect humans.  If clinically indicated additional testing with an alternate test  methodology [id number]) is advised. The SARS-CoV-2 RNA is generally  detectable in upper and lower respiratory sp ecimens during the acute  phase of infection. The expected result is Negative. Fact Sheet for Patients:  StrictlyIdeas.no Fact Sheet for Healthcare Providers: BankingDealers.co.za This test is not yet approved or cleared by the [place] FDA and has been authorized for detection and/or diagnosis of SARS-CoV-2 by FDA under an Emergency Use Authorization (EUA).  This EUA will remain in effect (meaning this test can be used) for the duration of the COVID-19 declaration under Section 564(b)(1) of the Act, 21 U.S.C. section 360bbb-3(b)(1), unless the authorization is terminated or revoked sooner. Performed at [facility], [place] [address] [place], [place] [address]   Respiratory Panel by PCR     Status: None   Collection Time: 08/06/19  1:48 AM   Specimen: Nasopharyngeal Swab; Respiratory  Result Value Ref Range Status   Adenovirus NOT DETECTED NOT DETECTED Final   Coronavirus 229E NOT DETECTED NOT DETECTED Final    Comment: (NOTE) The Coronavirus on the Respiratory Panel, DOES NOT test for the novel  Coronavirus (2019 nCoV)    Coronavirus HKU1 NOT DETECTED NOT DETECTED Final   Coronavirus NL63 NOT DETECTED NOT DETECTED Final   Coronavirus OC43 NOT DETECTED NOT DETECTED Final   Metapneumovirus NOT DETECTED NOT DETECTED Final   Rhinovirus / Enterovirus NOT DETECTED NOT DETECTED Final   Influenza A NOT DETECTED NOT DETECTED Final   Influenza B NOT DETECTED NOT DETECTED Final   Parainfluenza Virus 1 NOT DETECTED NOT DETECTED Final   Parainfluenza Virus 2 NOT DETECTED NOT DETECTED Final   Parainfluenza  Virus 3 NOT DETECTED NOT DETECTED Final   Parainfluenza Virus 4 NOT DETECTED NOT DETECTED Final   Respiratory Syncytial Virus NOT DETECTED NOT DETECTED Final   Bordetella pertussis NOT DETECTED NOT DETECTED Final   Chlamydophila pneumoniae NOT DETECTED NOT DETECTED Final   Mycoplasma pneumoniae NOT DETECTED NOT DETECTED Final    Comment: Performed at [facility], [place]. [address], [place], [place] [address]  Surgical PCR screen  Status: None   Collection Time: 08/07/19 10:15 PM   Specimen: Nasal Mucosa; Nasal Swab  Result Value Ref Range Status   MRSA, PCR NEGATIVE NEGATIVE Final   Staphylococcus aureus NEGATIVE NEGATIVE Final    Comment: (NOTE) The Xpert SA Assay (FDA approved for NASAL specimens in patients 48 years of age and older), is one component of a comprehensive surveillance program. It is not intended to diagnose infection nor to guide or monitor treatment. Performed at [facility], [place] [address] [place], [place] [address]   SARS CORONAVIRUS 2 Nasal Swab Aptima Multi Swab     Status: None   Collection Time: 08/11/19  5:52 PM   Specimen: Aptima Multi Swab; Nasal Swab  Result Value Ref Range Status   SARS Coronavirus 2 NEGATIVE NEGATIVE Final    Comment: (NOTE) SARS-CoV-2 target nucleic acids are NOT DETECTED. The SARS-CoV-2 RNA is generally detectable in upper and lower respiratory specimens during the acute phase of infection. Negative results do not preclude SARS-CoV-2 infection, do not rule out co-infections with other pathogens, and should not be used as the sole basis for treatment or other patient management decisions. Negative results must be combined with clinical observations, patient history, and epidemiological information. The expected result is Negative. Fact Sheet for Patients: SugarRoll.be Fact Sheet for Healthcare Providers: https://www.woods-mathews.com/ This test is not yet approved or cleared  by the [place] FDA and  has been authorized for detection and/or diagnosis of SARS-CoV-2 by FDA under an Emergency Use Authorization (EUA). This EUA will remain  in effect (meaning this test can be used) for the duration of the COVID-19 declaration under Section 56 4(b)(1) of the Act, 21 U.S.C. section 360bbb-3(b)(1), unless the authorization is terminated or revoked sooner. Performed at [facility], [place] [address] [place], [place] [address]          Radiology Studies: Ct Abdomen Wo Contrast  Result Date: 08/11/2019 CLINICAL DATA:  Laryngeal mass with airway narrowing, regional malignant adenopathy, now post tracheostomy placement. Preop planning for gastrostomy tube EXAM: CT ABDOMEN WITHOUT CONTRAST TECHNIQUE: Multidetector CT imaging of the abdomen was performed following the standard protocol without IV contrast. COMPARISON:  10/17/2011 FINDINGS: Lower chest: Small bilateral pleural effusions. Consolidation/atelectasis posteriorly in both lower lobes. Coronary calcifications. Hepatobiliary: No focal liver abnormality is seen. No gallstones, gallbladder Tow thickening, or biliary dilatation. Pancreas: Unremarkable. No pancreatic ductal dilatation or surrounding inflammatory changes. Spleen: Normal in size without focal abnormality. Adrenals/Urinary Tract: Adrenal glands are unremarkable. Kidneys are normal, without renal calculi, focal lesion, or hydronephrosis. Stomach/Bowel: Stomach is incompletely distended. There is a safe percutaneous window for gastrostomy placement. Visualized small bowel decompressed. Residual oral contrast in the visualized colon which is nondilated. Vascular/Lymphatic: Ectatic abdominal aorta up to 2.5 cm diameter with coarse atheromatous calcifications. No evident abdominal or mesenteric adenopathy. Other: No ascites.  No free air. Musculoskeletal: Spondylitic changes in the lower lumbar spine. No fracture or worrisome bone lesion. IMPRESSION: 1. Safe  percutaneous approach for gastrostomy placement identified. 2. Small bilateral pleural effusions with consolidation/atelectasis in the lower lobes. 3. Coronary and aortoiliac  atherosclerosis (ICD10-170.0). 4. Ectatic abdominal aorta at risk for aneurysm development. Recommend followup by ultrasound in 5 years. This recommendation follows ACR consensus guidelines: White Paper of the ACR Incidental Findings Committee II on Vascular Findings. J Am Coll Radiol 2013; 10:789-794. Electronically Signed   By: Lucrezia Europe M.D.   On: 08/11/2019 09:30   Ir Gastrostomy Tube Mod Sed  Result Date: 08/12/2019  INDICATION: 58 year old male with dysphagia secondary to head and neck carcinoma EXAM: PERC PLACEMENT GASTROSTOMY MEDICATIONS: 2 g Ancef; Antibiotics were administered within 1 hour of the procedure. ANESTHESIA/SEDATION: Versed 1.0 mg IV; Fentanyl 50 mcg IV Moderate Sedation Time:  17 minutes The patient was continuously monitored during the procedure by the interventional radiology nurse under my direct supervision. CONTRAST:  15 cc-administered into the gastric lumen. FLUOROSCOPY TIME:  Fluoroscopy Time: 3 minutes 42 seconds (38 mGy). COMPLICATIONS: None immediate. PROCEDURE: Informed written consent was obtained from the patient and the patient's family after a thorough discussion of the procedural risks, benefits and alternatives. All questions were addressed. Maximal Sterile Barrier Technique was utilized including caps, mask, sterile gowns, sterile gloves, sterile drape, hand hygiene and skin antiseptic. A timeout was performed prior to the initiation of the procedure. The epigastrium was prepped with Betadine in a sterile fashion, and a sterile drape was applied covering the operative field. A sterile gown and sterile gloves were used for the procedure. A 5-French orogastric tube is placed under fluoroscopic guidance. Scout imaging of the abdomen confirms barium within the transverse colon. The stomach was  distended with gas. Under fluoroscopic guidance, an 18 gauge needle was utilized to puncture the anterior [place] of the body of the stomach. An Amplatz wire was advanced through the needle passing a T fastener into the lumen of the stomach. The T fastener was secured for gastropexy. A 9-French sheath was inserted. A snare was advanced through the 9-French sheath. A Britta Mccreedy was advanced through the orogastric tube. It was snared then pulled out the oral cavity, pulling the snare, as well. The leading edge of the gastrostomy was attached to the snare. It was then pulled down the esophagus and out the percutaneous site. Tube secured in place. Contrast was injected. Patient tolerated the procedure well and remained hemodynamically stable throughout. No complications were encountered and no significant blood loss encountered. IMPRESSION: Status post fluoroscopic placed percutaneous gastrostomy tube, with 20 [place] pull-through. Signed, Dulcy Fanny. Earleen Newport, DO [facility] Radiology Electronically Signed   By: Corrie Mckusick D.O.   On: 08/12/2019 11:19        Scheduled Meds:  amLODipine  5 mg Oral Daily   Chlorhexidine Gluconate Cloth  6 each Topical Daily   [START ON 08/13/2019] enoxaparin (LOVENOX) injection  30 mg Subcutaneous Q24H   feeding supplement (ENSURE ENLIVE)  237 mL Oral TID   fentaNYL       insulin aspart  0-9 Units Subcutaneous TID WC   lidocaine       methylPREDNISolone (SOLU-MEDROL) injection  60 mg Intravenous Daily   midazolam       multivitamin with minerals  1 tablet Oral Daily   nicotine  21 mg Transdermal Daily   polyethylene glycol  17 g Oral Daily   Continuous Infusions:  ceFAZolin       LOS: 7 days    Time spent: 32 minutes    Hosie Poisson, MD [facility] Pager [phone]   If 7PM-7AM, please contact night-coverage [email or web address] Password [facility] 08/12/2019, 2:53 PM

## 2019-08-12 NOTE — Progress Notes (Signed)
SLP Cancellation Note  Patient Details Name: Perry Waller MRN: [id number] DOB: Nov 28, 1961   Cancelled treatment:       Reason Eval/Treat Not Completed: Medical issues which prohibited therapy(pt currently npo x sips/chips per IR MD remainder of today and overnight after Gtube placement, po diet in am.  SLP will continue efforts)  Luanna Salk, [facility] SLP Acute Rehab Services Pager [phone] Office [phone]  Macario Golds 08/12/2019, 3:45 PM

## 2019-08-12 NOTE — Progress Notes (Signed)
PT has strong productive cough at this time- denies need for suctioning at this time.

## 2019-08-13 DIAGNOSIS — Z515 Encounter for palliative care: Secondary | ICD-10-CM

## 2019-08-13 DIAGNOSIS — Z66 Do not resuscitate: Secondary | ICD-10-CM

## 2019-08-13 LAB — CBC WITH DIFFERENTIAL/PLATELET
Abs Immature Granulocytes: 0.04 10*3/uL (ref 0.00–0.07)
Basophils Absolute: 0 10*3/uL (ref 0.0–0.1)
Basophils Relative: 0 %
Eosinophils Absolute: 0 10*3/uL (ref 0.0–0.5)
Eosinophils Relative: 1 %
HCT: 36.1 % — ABNORMAL LOW (ref 39.0–52.0)
Hemoglobin: 11.4 g/dL — ABNORMAL LOW (ref 13.0–17.0)
Immature Granulocytes: 1 %
Lymphocytes Relative: 21 %
Lymphs Abs: 1.8 10*3/uL (ref 0.7–4.0)
MCH: 29.5 pg (ref 26.0–34.0)
MCHC: 31.6 g/dL (ref 30.0–36.0)
MCV: 93.3 fL (ref 80.0–100.0)
Monocytes Absolute: 0.8 10*3/uL (ref 0.1–1.0)
Monocytes Relative: 9 %
Neutro Abs: 5.7 10*3/uL (ref 1.7–7.7)
Neutrophils Relative %: 68 %
Platelets: 258 10*3/uL (ref 150–400)
RBC: 3.87 MIL/uL — ABNORMAL LOW (ref 4.22–5.81)
RDW: 12.3 % (ref 11.5–15.5)
WBC: 8.3 10*3/uL (ref 4.0–10.5)
nRBC: 0 % (ref 0.0–0.2)

## 2019-08-13 LAB — BASIC METABOLIC PANEL
Anion gap: 10 (ref 5–15)
BUN: 9 mg/dL (ref 6–20)
CO2: 28 mmol/L (ref 22–32)
Calcium: 8.3 mg/dL — ABNORMAL LOW (ref 8.9–10.3)
Chloride: 99 mmol/L (ref 98–111)
Creatinine, Ser: 0.49 mg/dL — ABNORMAL LOW (ref 0.61–1.24)
GFR calc Af Amer: >60 mL/min (ref >60)
GFR calc non Af Amer: >60 mL/min (ref >60)
Glucose, Bld: 93 mg/dL (ref 70–99)
Potassium: 3.3 mmol/L — ABNORMAL LOW (ref 3.5–5.1)
Sodium: 137 mmol/L (ref 135–145)

## 2019-08-13 LAB — GLUCOSE, CAPILLARY
Glucose-Capillary: 86 mg/dL (ref 70–99)
Glucose-Capillary: 146 mg/dL — ABNORMAL HIGH (ref 70–99)
Glucose-Capillary: 146 mg/dL — ABNORMAL HIGH (ref 70–99)
Glucose-Capillary: 133 mg/dL — ABNORMAL HIGH (ref 70–99)

## 2019-08-13 MED ORDER — JEVITY 1.2 CAL PO LIQD
1000.0000 mL | ORAL | Status: DC
  Administered 2019-08-13: 1000 mL
  Filled 2019-08-13 (×2): qty 1000

## 2019-08-13 MED ORDER — POTASSIUM CHLORIDE CRYS ER 20 MEQ PO TBCR
40.0000 meq | EXTENDED_RELEASE_TABLET | Freq: Once | ORAL | Status: AC
  Administered 2019-08-13: 16:00:00 40 meq via ORAL
  Filled 2019-08-13: qty 2

## 2019-08-13 MED ORDER — DM-GUAIFENESIN ER 30-600 MG PO TB12
1.0000 | ORAL_TABLET | Freq: Two times a day (BID) | ORAL | Status: DC
  Administered 2019-08-13 – 2019-08-17 (×8): 1 via ORAL
  Filled 2019-08-13 (×8): qty 1

## 2019-08-13 NOTE — Progress Notes (Signed)
Brief Nutrition Note  Consult received for enteral/tube feeding initiation and management.  Adult Enteral Nutrition Protocol initiated. See full assessment o from 8/8.   Admitting Dx: Neck mass [R22.1] Chronic bronchitis, unspecified chronic bronchitis type ([place]) [J42]  Body mass index is 17.24 kg/m. Pt meets criteria for underweight based on current BMI.  Labs:  Recent Labs  Lab 08/08/19 0325  08/10/19 0231 08/11/19 0543 08/12/19 0949 08/13/19 0547  NA 139   < > 140 138 139 137  K 4.2   < > 3.6 3.1* 3.1* 3.3*  CL 104   < > 103 102 99 99  CO2 27   < > 29 30 30 28   BUN 7   < > 15 8 9 9   CREATININE 0.65   < > 0.75 0.55* 0.54* 0.49*  CALCIUM 8.4*   < > 8.0* 8.0* 8.2* 8.3*  MG 1.7  --  2.0  --  2.0  --   GLUCOSE 166*   < > 147* 106* 89 93   < > = values in this interval not displayed.   Mariana Single RD, LDN Clinical Nutrition Pager # [phone]

## 2019-08-13 NOTE — Progress Notes (Signed)
PROGRESS NOTE    Perry Waller  NOB:[id number] DOB: 02/22/1961 DOA: 08/05/2019 PCP: [name], FNP   Brief Narrative:  58 year old male with prior h/o COPD, tobacco abuse, arthritis presents with sob and neck swelling associated with weight loss . CT neck showed large laryngeal mass with air way compromise, admitted to the [facility] for further evaluation. ENT consulted, underwent tracheostomy and biopsy showing squamous cell carcinoma. Oncology consulted, recommendations were for laryngectomy but patient refused, chemotherapy and radiation therapy.  Radiation therapy starting next week. No plans for chemotherapy. SLP evaluation recommending soft diet with honey nectar liquids.   Meanwhile as per oncology recommendations,  IR consulted for PEG placement,.  He underwent PEG placement on 08/12/2019.  Palliative care consulted and awaiting recommendations  8/15 Patient seen and examined today he appears to be in better spirits, diet was restarted and tube feeds started at the same time.   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  Radiation treatments to start next week if patient is agreeable.  Oncology further suggests PEG placement as his radiation therapy is going to worsen his dysphagia.  PEG placement 08/12/2019 and tube feeds were started on 08/13/2019 as per nutrition recommendations. Pain control with tramadol and morphine for neck pain.   Acute COPD exacerbation   Wheezing is much better and he is currently on oral steroids for a quick taper  Severe Protein calorie malnutrition:  -  nutrition on board, SLP on board. Meanwhile IR consulted to see if he will benefit from PEG for nutrition, as his radiation therapy might worsen his dysphagia.  Underwent PEG placement on 08/12/2019 and tube feeds started slowly by nutritionist.    Hypokalemia:  Replaced.   Hypertension:  Well controlled. Resume Norvasc 10 mg daily.   Tobacco abuse  On Nicotine patch.  Constipation: resume senna, colace and miralax. .   Mild anemia of chronic disease Hemoglobin stable between 11 to 12.  No signs of bleeding from the trach site.     DVT prophylaxis: Lovenox.  Code Status: FULL CODE.  Family Communication: NONE AT BEDSIDE.  Disposition Plan: pending palliative care consult.   Consultants:   ENT  IR  Oncology  Radiation oncology.   Palliative care consult   Procedures: Tracheostomy  By Dr Janace Hoard on 8/10   Antimicrobials: none.    Subjective: Patient reports his breathing is better he appears to be in good spirits. Cough and secretions are better, he wants to see if PSV can be placed by SLP.   Objective: Vitals:   08/13/19 0448 08/13/19 0815 08/13/19 1245 08/13/19 1432  BP: (!) 137/95   122/80  Pulse: 82   97  Resp: 16   16  Temp: 99 F (37.2 C)   98.1 F (36.7 C)  TempSrc: Oral   Oral  SpO2: 100% 100% 100% 100%  Weight:      Height:        Intake/Output Summary (Last 24 hours) at 08/13/2019 1535 Last data filed at 08/13/2019 1020 Gross per 24 hour  Intake 360 ml  Output 1325 ml  Net -965 ml   Filed Weights   08/06/19 0120  Weight: 47 kg    Examination:  General exam:  Cachectic looking gentleman status post trach on oxygen and comfortable Respiratory system: Wheezing has improved, air entry fair scattered crackles at bases Cardiovascular system: S1 S2 heard, regular rate rhythm, no pedal edema Gastrointestinal system: abd is soft, nontender, nondistended, bowel sounds are heard, PEG in place Central nervous system: Alert and answering all  questions appropriately Extremities; no leg edema.  No cyanosis or clubbing Skin: No rashes, lesions or ulcers Psychiatry:  Flat affect.     Data Reviewed: I have personally reviewed following labs and imaging studies  CBC: Recent Labs  Lab 08/09/19 0834 08/10/19 0231 08/11/19 0543 08/12/19 0949 08/13/19 0547  WBC 11.0* 9.4 6.2 6.8 8.3  NEUTROABS 9.3* 6.7 3.7 3.9 5.7  HGB 12.9* 11.5* 11.9* 12.5* 11.4*  HCT 40.9 36.6* 38.4* 40.6 36.1*  MCV 95.6 96.8 95.8 96.9 93.3  PLT 257 254 238 272 650   Basic Metabolic Panel: Recent Labs  Lab 08/07/19 0259 08/08/19 0325 08/09/19 0834 08/10/19 0231 08/11/19 0543 08/12/19 0949 08/13/19 0547  NA 137 139 137 140 138 139 137  K 4.0 4.2 3.6 3.6 3.1* 3.1* 3.3*  CL 101 104 100 103 102 99 99  CO2 24 27 28 29 30 30 28   GLUCOSE 258* 166* 140* 147* 106* 89 93  BUN 6 7 8 15 8 9 9   CREATININE 0.90 0.65 0.59* 0.75 0.55* 0.54* 0.49*  CALCIUM 8.4* 8.4* 8.4* 8.0* 8.0* 8.2* 8.3*  MG 1.6* 1.7  --  2.0  --  2.0  --    GFR: Estimated Creatinine Clearance: 66.9 mL/min (A) (by C-G formula based on SCr of 0.49 mg/dL (L)). Liver Function Tests: No results for input(s): AST, ALT, ALKPHOS, BILITOT, PROT, ALBUMIN in the last 168 hours. No results for input(s): LIPASE, AMYLASE in the last 168 hours. No results for input(s): AMMONIA in the last 168 hours. Coagulation Profile: Recent Labs  Lab 08/12/19 0949  INR 0.9   Cardiac Enzymes: No results for input(s): CKTOTAL, CKMB, CKMBINDEX, TROPONINI in the last 168 hours. BNP (last 3 results) No results for input(s): PROBNP in the last 8760 hours. HbA1C: No results for input(s): HGBA1C in the last 72 hours. CBG: Recent Labs  Lab 08/11/19 2131 08/12/19 0754 08/12/19 2049 08/13/19 0736 08/13/19 1153  GLUCAP 102* 104* 154* 86 146*   Lipid Profile: No results for input(s): CHOL, HDL, LDLCALC, TRIG, CHOLHDL, LDLDIRECT in the last 72 hours. Thyroid Function Tests: No results for input(s): TSH,  T4TOTAL, FREET4, T3FREE, THYROIDAB in the last 72 hours. Anemia Panel: No results for input(s): VITAMINB12, FOLATE, FERRITIN, TIBC, IRON, RETICCTPCT in the last 72 hours. Sepsis Labs: No results for input(s): PROCALCITON, LATICACIDVEN in the last 168 hours.  Recent Results (from the past 240 hour(s))  SARS Coronavirus 2 [facility] order, Performed in [facility] hospital lab) Nasopharyngeal Nasopharyngeal Swab     Status: None   Collection Time: 08/05/19  7:35 PM   Specimen: Nasopharyngeal Swab  Result Value Ref Range Status   SARS Coronavirus 2 NEGATIVE NEGATIVE Final    Comment: (NOTE) If result is NEGATIVE SARS-CoV-2 target nucleic acids are NOT DETECTED. The SARS-CoV-2 RNA is generally detectable in upper and lower  respiratory specimens during the acute phase of infection. The lowest  concentration of SARS-CoV-2 viral copies this assay can detect is 250  copies / mL. A negative result does not  preclude SARS-CoV-2 infection  and should not be used as the sole basis for treatment or other  patient management decisions.  A negative result may occur with  improper specimen collection / handling, submission of specimen other  than nasopharyngeal swab, presence of viral mutation(s) within the  areas targeted by this assay, and inadequate number of viral copies  (<250 copies / mL). A negative result must be combined with clinical  observations, patient history, and epidemiological information. If result is POSITIVE SARS-CoV-2 target nucleic acids are DETECTED. The SARS-CoV-2 RNA is generally detectable in upper and lower  respiratory specimens dur ing the acute phase of infection.  Positive  results are indicative of active infection with SARS-CoV-2.  Clinical  correlation with patient history and other diagnostic information is  necessary to determine patient infection status.  Positive results do  not rule out bacterial infection or co-infection with other viruses. If result is  PRESUMPTIVE POSTIVE SARS-CoV-2 nucleic acids MAY BE PRESENT.   A presumptive positive result was obtained on the submitted specimen  and confirmed on repeat testing.  While 2019 novel coronavirus  (SARS-CoV-2) nucleic acids may be present in the submitted sample  additional confirmatory testing may be necessary for epidemiological  and / or clinical management purposes  to differentiate between  SARS-CoV-2 and other Sarbecovirus currently known to infect humans.  If clinically indicated additional testing with an alternate test  methodology [phone]) is advised. The SARS-CoV-2 RNA is generally  detectable in upper and lower respiratory sp ecimens during the acute  phase of infection. The expected result is Negative. Fact Sheet for Patients:  StrictlyIdeas.no Fact Sheet for Healthcare Providers: BankingDealers.co.za This test is not yet approved or cleared by the [place] FDA and has been authorized for detection and/or diagnosis of SARS-CoV-2 by FDA under an Emergency Use Authorization (EUA).  This EUA will remain in effect (meaning this test can be used) for the duration of the COVID-19 declaration under Section 564(b)(1) of the Act, 21 U.S.C. section 360bbb-3(b)(1), unless the authorization is terminated or revoked sooner. Performed at [facility], [place] [address], [place], [place] [address]   Respiratory Panel by PCR     Status: None   Collection Time: 08/06/19  1:48 AM   Specimen: Nasopharyngeal Swab; Respiratory  Result Value Ref Range Status   Adenovirus NOT DETECTED NOT DETECTED Final   Coronavirus 229E NOT DETECTED NOT DETECTED Final    Comment: (NOTE) The Coronavirus on the Respiratory Panel, DOES NOT test for the novel  Coronavirus (2019 nCoV)    Coronavirus HKU1 NOT DETECTED NOT DETECTED Final   Coronavirus NL63 NOT DETECTED NOT DETECTED Final   Coronavirus OC43 NOT DETECTED NOT DETECTED Final    Metapneumovirus NOT DETECTED NOT DETECTED Final   Rhinovirus / Enterovirus NOT DETECTED NOT DETECTED Final   Influenza A NOT DETECTED NOT DETECTED Final   Influenza B NOT DETECTED NOT DETECTED Final   Parainfluenza Virus 1 NOT DETECTED NOT DETECTED Final   Parainfluenza Virus 2 NOT DETECTED NOT DETECTED Final   Parainfluenza Virus 3 NOT DETECTED NOT DETECTED Final   Parainfluenza Virus 4 NOT DETECTED NOT DETECTED Final   Respiratory Syncytial Virus NOT DETECTED NOT DETECTED Final   Bordetella pertussis NOT DETECTED NOT DETECTED Final   Chlamydophila pneumoniae NOT DETECTED NOT DETECTED Final   Mycoplasma pneumoniae NOT DETECTED NOT DETECTED Final    Comment: Performed at [facility], [place]. [address] [place], [place] [address]  Surgical PCR screen  Status: None   Collection Time: 08/07/19 10:15 PM   Specimen: Nasal Mucosa; Nasal Swab  Result Value Ref Range Status   MRSA, PCR NEGATIVE NEGATIVE Final   Staphylococcus aureus NEGATIVE NEGATIVE Final    Comment: (NOTE) The Xpert SA Assay (FDA approved for NASAL specimens in patients 38 years of age and older), is one component of a comprehensive surveillance program. It is not intended to diagnose infection nor to guide or monitor treatment. Performed at [facility], [place] [address], [place], [place] [address]   SARS CORONAVIRUS 2 Nasal Swab Aptima Multi Swab     Status: None   Collection Time: 08/11/19  5:52 PM   Specimen: Aptima Multi Swab; Nasal Swab  Result Value Ref Range Status   SARS Coronavirus 2 NEGATIVE NEGATIVE Final    Comment: (NOTE) SARS-CoV-2 target nucleic acids are NOT DETECTED. The SARS-CoV-2 RNA is generally detectable in upper and lower respiratory specimens during the acute phase of infection. Negative results do not preclude SARS-CoV-2 infection, do not rule out co-infections with other pathogens, and should not be used as the sole basis for treatment or other patient management decisions.  Negative results must be combined with clinical observations, patient history, and epidemiological information. The expected result is Negative. Fact Sheet for Patients: SugarRoll.be Fact Sheet for Healthcare Providers: https://www.woods-mathews.com/ This test is not yet approved or cleared by the [place] FDA and  has been authorized for detection and/or diagnosis of SARS-CoV-2 by FDA under an Emergency Use Authorization (EUA). This EUA will remain  in effect (meaning this test can be used) for the duration of the COVID-19 declaration under Section 56 4(b)(1) of the Act, 21 U.S.C. section 360bbb-3(b)(1), unless the authorization is terminated or revoked sooner. Performed at [facility], [place] [address], [place], [place] [address]          Radiology Studies: Ir Gastrostomy Tube Mod Sed  Result Date: 08/12/2019 INDICATION: 58 year old male with dysphagia secondary to head and neck carcinoma EXAM: PERC PLACEMENT GASTROSTOMY MEDICATIONS: 2 g Ancef; Antibiotics were administered within 1 hour of the procedure. ANESTHESIA/SEDATION: Versed 1.0 mg IV; Fentanyl 50 mcg IV Moderate Sedation Time:  17 minutes The patient was continuously monitored during the procedure by the interventional radiology nurse under my direct supervision. CONTRAST:  15 cc-administered into the gastric lumen. FLUOROSCOPY TIME:  Fluoroscopy Time: 3 minutes 42 seconds (38 mGy). COMPLICATIONS: None immediate. PROCEDURE: Informed written consent was obtained from the patient and the patient's family after a thorough discussion of the procedural risks, benefits and alternatives. All questions were addressed. Maximal Sterile Barrier Technique was utilized including caps, mask, sterile gowns, sterile gloves, sterile drape, hand hygiene and skin antiseptic. A timeout was performed prior to the initiation of the procedure. The epigastrium was prepped with Betadine in a sterile  fashion, and a sterile drape was applied covering the operative field. A sterile gown and sterile gloves were used for the procedure. A 5-French orogastric tube is placed under fluoroscopic guidance. Scout imaging of the abdomen confirms barium within the transverse colon. The stomach was distended with gas. Under fluoroscopic guidance, an 18 gauge needle was utilized to puncture the anterior Rahrig of the body of the stomach. An Amplatz wire was advanced through the needle passing a T fastener into the lumen of the stomach. The T fastener was secured for gastropexy. A 9-French sheath was inserted. A snare was advanced through the 9-French sheath. A Britta Mccreedy was advanced through the orogastric tube. It was  snared then pulled out the oral cavity, pulling the snare, as well. The leading edge of the gastrostomy was attached to the snare. It was then pulled down the esophagus and out the percutaneous site. Tube secured in place. Contrast was injected. Patient tolerated the procedure well and remained hemodynamically stable throughout. No complications were encountered and no significant blood loss encountered. IMPRESSION: Status post fluoroscopic placed percutaneous gastrostomy tube, with 20 [place] pull-through. Signed, Dulcy Fanny. Earleen Newport, DO [facility] Electronically Signed   By: Corrie Mckusick D.O.   On: 08/12/2019 11:19        Scheduled Meds: . amLODipine  10 mg Oral Daily  . Chlorhexidine Gluconate Cloth  6 each Topical Daily  . dextromethorphan-guaiFENesin  1 tablet Oral BID  . enoxaparin (LOVENOX) injection  30 mg Subcutaneous Q24H  . feeding supplement (ENSURE ENLIVE)  237 mL Oral TID  . insulin aspart  0-9 Units Subcutaneous TID WC  . multivitamin with minerals  1 tablet Oral Daily  . neomycin-bacitracin-polymyxin  1 application Topical Daily  . nicotine  21 mg Transdermal Daily  . polyethylene glycol  17 g Oral Daily  . potassium chloride  40  mEq Oral Once  . predniSONE  40 mg Oral QAC breakfast   Continuous Infusions: . feeding supplement (JEVITY 1.2 CAL) 1,000 mL (08/13/19 1347)     LOS: 8 days    Time spent: 30 minutes    Hosie Poisson, MD [facility] Pager [phone]   If 7PM-7AM, please contact night-coverage [email or web address] Password [facility] 08/13/2019, 3:35 PM

## 2019-08-13 NOTE — Consult Note (Signed)
Consultation Note Date: 08/13/2019   Patient Name: Perry Waller  DOB: [birth year]  MRN: [id number]  Age / Sex: 58 y.o., male  PCP: [name], [place] Referring Physician: Hosie Poisson, MD  Reason for Consultation: Establishing goals of care and Psychosocial/spiritual support  HPI/Patient Profile: 58 y.o. male  with past medical history of COPD, arthritis and polysubstance abuse who was admitted on 08/05/2019 with neck swelling and dysphagia.  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  He has received a tracheostomy and PEG this admission.  Clinical Assessment and Goals of Care:  I have reviewed medical records including EPIC notes, labs and imaging, assessed the patient and then spoke on the phone with his sister Henriette Nickelson (Air traffic controller) to discuss diagnosis prognosis, GOC, EOL wishes, disposition and options.  I introduced Palliative Medicine as specialized medical care for people living with serious illness. It focuses on providing relief from the symptoms and stress of a serious illness. The goal is to improve quality of life for both the patient and the family.  We discussed a brief life review of the patient. Per his sister Tanner has always been a quiet man.  He never married and had no children.  He is one of 5 children.  2 of his brothers have died in the last two years.  He has 1 brother and 1 sister (Henriette) left.  He currently lives with Henriette and her daughter Phineas Real.  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  Per Henriette in later years he lived on the street and unfortunately was into drugs and alcohol.  He came to [place] 3 months ago from [facility] because he wanted to be closer to family.  He knew something was wrong and he needed help (but never imagined he had cancer).  He lives with his sister now and spends most of his  time playing spades and dominos and watching TV.  He believes in God and loves gospel music.    As far as functional and nutritional status he was walking and eating (with difficulty) on admission.  He has never been a big eater but his sister stated he had lost a dramatic amount of weight over the last several months.  When I met Stran in his hospital room he was able to write on a clip board but was not speaking.  He wanted to eat - so we ordered his breakfast tray.  He wrote "I'm scared"  I tried to reassure him.  He indicated that he never wanted this as he pointed to the tracheostomy and the PEG tube.  He indicated that he consented to them in order to make his sister happy.  We talked about code status.  Lavan indicates clearly by shaking his head that he would not want resuscitation.  (His sister agrees that he would not want resuscitation).    Evon stated his PEG site was painful last night but the pain medications helped.  He  was very complimentary of his nurse and the care he has received at [facility].  Hospice and Palliative Care services outpatient were explained and offered. Patient indicates that he would like for Palliative to follow him at home in addition to home health services.  Questions and concerns were addressed.  The family was encouraged to call with questions or concerns.     Primary Decision Maker:  PATIENT. His sister is the decision making surrogate if Martyn is unable to answer for himself.    SUMMARY OF RECOMMENDATIONS    Will request PT (per sister he needs to get up and walk)  Code status changed to DNR/DNI -  Patient states he did not want any of this.  Home health for trach and PEG care  Palliative Care to follow outpatient  Consider asking speech to try a PMV one more time (patient indicates he is scared).  Patient requesting mucinex - seems appropriate consider the secretions from trach.  Thanks for allowing [place] to see this very nice man.   Code  Status/Advance Care Planning:  DNR   Palliative Prophylaxis:   Frequent Pain Assessment  Psycho-social/Spiritual:   Desire for further Chaplaincy support: welcomed.  Prognosis:   Unable to determine  Discharge Planning: Home with Home Health and Palliative       Primary Diagnoses: Present on Admission: . COPD exacerbation ([place]) . Neck mass . Hypokalemia . Tobacco abuse . Constipation   I have reviewed the medical record, interviewed the patient and family, and examined the patient. The following aspects are pertinent.  Past Medical History:  Diagnosis Date  . Acute respiratory acidosis 06/29/2019  . Arthritis   . COPD (chronic obstructive pulmonary disease) (HCC)    Social History   Socioeconomic History  . Marital status: Single    Spouse name: Not on file  . Number of children: Not on file  . Years of education: Not on file  . Highest education level: Not on file  Occupational History  . Not on file  Social Needs  . Financial resource strain: Not on file  . Food insecurity    Worry: Not on file    Inability: Not on file  . Transportation needs    Medical: Not on file    Non-medical: Not on file  Tobacco Use  . Smoking status: Current Every Day Smoker    Packs/day: 0.50    Years: 45.00    Pack years: 22.50    Types: Cigarettes  . Smokeless tobacco: Never Used  Substance and Sexual Activity  . Alcohol use: Yes    Comment: occ  . Drug use: No  . Sexual activity: Not on file  Lifestyle  . Physical activity    Days per week: Not on file    Minutes per session: Not on file  . Stress: Not on file  Relationships  . Social Herbalist on phone: Not on file    Gets together: Not on file    Attends religious service: Not on file    Active member of club or organization: Not on file    Attends meetings of clubs or organizations: Not on file    Relationship status: Not on file  Other Topics Concern  . Not on file  Social History  Narrative   Patient is new to [facility], he moved from [place] two weeks ago.    Family History  Problem Relation Age of Onset  . Cancer Father   . Cancer  Brother    Scheduled Meds: . amLODipine  10 mg Oral Daily  . Chlorhexidine Gluconate Cloth  6 each Topical Daily  . dextromethorphan-guaiFENesin  1 tablet Oral BID  . enoxaparin (LOVENOX) injection  30 mg Subcutaneous Q24H  . feeding supplement (ENSURE ENLIVE)  237 mL Oral TID  . insulin aspart  0-9 Units Subcutaneous TID WC  . multivitamin with minerals  1 tablet Oral Daily  . neomycin-bacitracin-polymyxin  1 application Topical Daily  . nicotine  21 mg Transdermal Daily  . polyethylene glycol  17 g Oral Daily  . predniSONE  40 mg Oral QAC breakfast   Continuous Infusions: . feeding supplement (JEVITY 1.2 CAL)     PRN Meds:.acetaminophen, albuterol, hydrALAZINE, morphine injection, ondansetron (ZOFRAN) IV, Resource ThickenUp Clear, senna-docusate, traMADol Allergies  Allergen Reactions  . Fish Allergy Other (See Comments)    Breaks out   Review of Systems denies pain currently.  Requests mucinex for secretions. Denies constipation.  Requesting food.  Physical Exam  Thin well developed male awake, alert, orientated. Wearing glasses. Trach in place.  Thick secretions from trach.  No resp distress CV rrr no m/r/g Abdomen thin, soft, nt, nd, PEG site clean without erythema or drainage. LE with no edema.  Able to move.  Vital Signs: BP (!) 137/95 (BP Location: Left Arm)   Pulse 82   Temp 99 F (37.2 C) (Oral)   Resp 16   Ht _0  (1.651 m)   Wt 47 kg   SpO2 100%   BMI 17.24 kg/m  Pain Scale: 0-10   Pain Score: 0-No pain   SpO2: SpO2: 100 % O2 Device:SpO2: 100 % O2 Flow Rate: .O2 Flow Rate (L/min): 5 L/min  IO: Intake/output summary:   Intake/Output Summary (Last 24 hours) at 08/13/2019 1030 Last data filed at 08/13/2019 1020 Gross per 24 hour  Intake 360 ml  Output 1825 ml  Net -1465 ml    LBM: Last BM  Date: 08/11/19 Baseline Weight: Weight: 47 kg Most recent weight: Weight: 47 kg     Palliative Assessment/Data: 50%     Time In: 9:40 Time Out: 10:50 Time Total: 70 min. Visit consisted of counseling and education dealing with the complex and emotionally intense issues surrounding the need for palliative care and symptom management in the setting of serious and potentially life-threatening illness. Greater than 50%  of this time was spent counseling and coordinating care related to the above assessment and plan.  Signed by: Florentina Jenny, PA-C Palliative Medicine Pager: [phone]  Please contact Palliative Medicine Team phone at [phone] for questions and concerns.  For individual provider: See Shea Evans

## 2019-08-13 NOTE — Progress Notes (Signed)
Patient ID: Perry Waller, male   DOB: [birth year], 58 y.o.   MRN: [id number] Pt s/p G tube yesterday; doing ok this pm; some soreness at site as expected; G tube intact, insertion site ok, mildly tender; abd soft,ND; ok to use tube as needed

## 2019-08-13 NOTE — Progress Notes (Addendum)
Occupational Therapy Evaluation Patient Details Name: Perry Waller MRN: [id number] DOB: Dec 13, 1961 Today's Date: 08/13/2019    History of Present Illness Perry Waller is a 58 y.o. malewho presents to [facility] with dysphagia and squamous cell carcinoma. PMH consist of COPD, acute resp. acidosis, and OA.   Clinical Impression   Pt presents with above diagnosis. PTA pt PLOF independent in all ADLs living with siblings. Pt currently limited in functional transfers for safety awareness and UE exercises to increase strength. Pt limited in verbal expression due to tracheostomy, written communication required, pt able to comprehend full sentences. Pt will benefit from continued acute OT to address safety in ADLs and energy conservation prior to dc to [facility] with speech recommended.      Follow Up Recommendations  Home health OT    Equipment Recommendations  3 in 1 bedside commode    Recommendations for Other Services Speech consult     Precautions / Restrictions Restrictions Weight Bearing Restrictions: No      Mobility Bed Mobility Overal bed mobility: Independent             General bed mobility comments: no physical assistance required.  Transfers Overall transfer level: Independent Equipment used: None                  Balance Overall balance assessment: Needs assistance Sitting-balance support: No upper extremity supported;Feet unsupported Sitting balance-Leahy Scale: Good     Standing balance support: No upper extremity supported Standing balance-Leahy Scale: Good                             ADL either performed or assessed with clinical judgement   ADL Overall ADL's : Needs assistance/impaired     Grooming: Wash/dry hands;Wash/dry face;Oral care;Set up;Standing   Upper Body Bathing: Independent;Sitting   Lower Body Bathing: Independent;Sitting/lateral leans   Upper Body Dressing : Independent;Sitting   Lower Body Dressing:  Independent;Sitting/lateral leans   Toilet Transfer: Supervision/safety           Functional mobility during ADLs: Supervision/safety General ADL Comments: Supervision for safety. Simulated toilet transfer from bed back to bed.      Vision         Perception     Praxis      Pertinent Vitals/Pain Pain Assessment: 0-10 Faces Pain Scale: Hurts a little bit Pain Location: neck Pain Descriptors / Indicators: Aching Pain Intervention(s): Monitored during session     Hand Dominance Right   Extremity/Trunk Assessment Upper Extremity Assessment Upper Extremity Assessment: Overall WFL for tasks assessed   Lower Extremity Assessment Lower Extremity Assessment: Defer to PT evaluation       Communication Communication Communication: Tracheostomy   Cognition Arousal/Alertness: Awake/alert Behavior During Therapy: Anxious;WFL for tasks assessed/performed Overall Cognitive Status: Within Functional Limits for tasks assessed                                 General Comments: Pt observed to get anxious with some commands or question. Likely frustration due to inability to vocally express.   General Comments  written communication required for pt to answer questions.     Exercises     Shoulder Instructions      Home Living Family/patient expects to be discharged to:: Private residence Living Arrangements: Other relatives Available Help at Discharge: Family;Friend(s) Type of Home: House       Home Layout:  Two level     Bathroom Shower/Tub: Tub/shower unit         Home Equipment: None          Prior Functioning/Environment Level of Independence: Independent        Comments: pt reports not requiring assistance with any ADLs PTA        OT Problem List: Decreased activity tolerance;Decreased safety awareness;Decreased knowledge of use of DME or AE      OT Treatment/Interventions: Self-care/ADL training;Therapeutic exercise;DME and/or AE  instruction;Therapeutic activities;Patient/family education;Balance training    OT Goals(Current goals can be found in the care plan section) Acute Rehab OT Goals Patient Stated Goal: no goal stated OT Goal Formulation: With patient Time For Goal Achievement: 08/27/19 Potential to Achieve Goals: Good  OT Frequency: Min 1X/week   Barriers to D/C:            Co-evaluation              AM-PAC OT "6 Clicks" Daily Activity     Outcome Measure Help from another person eating meals?: None Help from another person taking care of personal grooming?: None Help from another person toileting, which includes using toliet, bedpan, or urinal?: None Help from another person bathing (including washing, rinsing, drying)?: None Help from another person to put on and taking off regular upper body clothing?: None Help from another person to put on and taking off regular lower body clothing?: None 6 Click Score: 24   End of Session Equipment Utilized During Treatment: Gait belt Nurse Communication: Mobility status  Activity Tolerance: Patient tolerated treatment well Patient left: in bed;with call bell/phone within reach;with nursing/sitter in room  OT Visit Diagnosis: Muscle weakness (generalized) (M62.81)                Time: 0315-9458 OT Time Calculation (min): 28 min Charges:  OT General Charges $OT Visit: 1 Visit OT Evaluation $OT Eval Low Complexity: 1 Low OT Treatments $Self Care/Home Management : 8-22 mins  Perry Waller, MSOT, OTR/L  Supplemental Rehabilitation Services  [phone]   Perry Waller 08/13/2019, 3:02 PM

## 2019-08-13 NOTE — Evaluation (Signed)
Physical Therapy Evaluation Patient Details Name: Perry Waller MRN: [id number] DOB: 07-19-1961 Today's Date: 08/13/2019   History of Present Illness  Perry Waller is a 58 y.o. male who presents to [facility] with dysphagia and squamous cell carcinoma. PMH consist of COPD, acute resp. acidosis, and OA.  Clinical Impression  Pt admitted with above diagnosis.  Pt will benefit from PT in acute setting. HHPT vs no f/u depending on progress;  Pt currently with functional limitations due to the deficits listed below (see PT Problem List). Pt will benefit from skilled PT to increase their independence and safety with mobility to allow discharge to the venue listed below.       Follow Up Recommendations Home health PT;Supervision - Intermittent(vs no f/u depending on progress)    Equipment Recommendations  None recommended by PT    Recommendations for Other Services       Precautions / Restrictions Precautions Precautions: Fall;Other (comment) Precaution Comments: trach/T-collar Restrictions Weight Bearing Restrictions: No      Mobility  Bed Mobility Overal bed mobility: Independent             General bed mobility comments: no physical assistance required.  Transfers Overall transfer level: Needs assistance Equipment used: Rolling walker (2 wheeled) Transfers: Sit to/from Stand Sit to Stand: Supervision         General transfer comment: for lines/safety, no physical assist  Ambulation/Gait Ambulation/Gait assistance: Min guard;+2 safety/equipment Gait Distance (Feet): 400 Feet Assistive device: None Gait Pattern/deviations: Step-through pattern;Drifts right/left;Narrow base of support;Decreased stride length     General Gait Details: cues for safety, unsteady initially, improved stability with incr distance; SpO2= 95% or greater  Stairs            Wheelchair Mobility    Modified Rankin (Stroke Patients Only)       Balance Overall balance assessment: Needs  assistance Sitting-balance support: No upper extremity supported;Feet unsupported Sitting balance-Leahy Scale: Good     Standing balance support: During functional activity;No upper extremity supported Standing balance-Leahy Scale: Fair Standing balance comment: unable to tolerate mod challenges             High level balance activites: Direction changes;Turns;Head turns High Level Balance Comments: mild gait instability with delayed reactions but able to self recover             Pertinent Vitals/Pain Pain Assessment: No/denies pain Faces Pain Scale: Hurts a little bit Pain Location: neck Pain Descriptors / Indicators: Aching Pain Intervention(s): Monitored during session    Home Living Family/patient expects to be discharged to:: Private residence Living Arrangements: Other relatives Available Help at Discharge: Family;Friend(s) Type of Home: House       Home Layout: Two level Home Equipment: None Additional Comments: lives with sister and pt reports she assists with anything that he needs    Prior Function Level of Independence: Independent         Comments: pt reports not requiring assistance with any ADLs PTA     Hand Dominance   Dominant Hand: Right    Extremity/Trunk Assessment   Upper Extremity Assessment Upper Extremity Assessment: Overall WFL for tasks assessed;Defer to OT evaluation    Lower Extremity Assessment Lower Extremity Assessment: Overall WFL for tasks assessed(diffuse atrophy but [facility] for activities tested)       Communication   Communication: Tracheostomy  Cognition Arousal/Alertness: Awake/alert Behavior During Therapy: WFL for tasks assessed/performed Overall Cognitive Status: Within Functional Limits for tasks assessed  General Comments: Pt observed to get anxious with some commands or question. Likely frustration due to inability to vocally express.      General Comments  General comments (skin integrity, edema, etc.): written communication required for pt to answer questions.     Exercises     Assessment/Plan    PT Assessment Patient needs continued PT services  PT Problem List Decreased activity tolerance;Decreased balance;Pain;Decreased knowledge of use of DME;Decreased mobility       PT Treatment Interventions DME instruction;Gait training;Functional mobility training;Therapeutic exercise;Patient/family education;Therapeutic activities    PT Goals (Current goals can be found in the Care Plan section)  Acute Rehab PT Goals Patient Stated Goal: no goal stated PT Goal Formulation: With patient Time For Goal Achievement: 08/20/19 Potential to Achieve Goals: Good    Frequency Min 3X/week   Barriers to discharge        Co-evaluation               AM-PAC PT "6 Clicks" Mobility  Outcome Measure Help needed turning from your back to your side while in a flat bed without using bedrails?: None Help needed moving from lying on your back to sitting on the side of a flat bed without using bedrails?: None Help needed moving to and from a bed to a chair (including a wheelchair)?: A Little Help needed standing up from a chair using your arms (e.g., wheelchair or bedside chair)?: A Little Help needed to walk in hospital room?: A Little Help needed climbing 3-5 steps with a railing? : A Little 6 Click Score: 20    End of Session Equipment Utilized During Treatment: Gait belt;Other (comment)(O2/trach collar) Activity Tolerance: Patient tolerated treatment well Patient left: in bed;with call bell/phone within reach;with bed alarm set Nurse Communication: Mobility status PT Visit Diagnosis: Unsteadiness on feet (R26.81)    Time: 3785-8850 PT Time Calculation (min) (ACUTE ONLY): 28 min   Charges:   PT Evaluation $PT Eval Low Complexity: 1 Low PT Treatments $Gait Training: 8-22 mins        Kenyon Ana, PT  Pager: [phone] Acute  Rehab Dept [facility]): [phone]   08/13/2019   [facility] 08/13/2019, 4:58 PM

## 2019-08-14 LAB — CBC WITH DIFFERENTIAL/PLATELET
Abs Immature Granulocytes: 0.02 10*3/uL (ref 0.00–0.07)
Basophils Absolute: 0 10*3/uL (ref 0.0–0.1)
Basophils Relative: 0 %
Eosinophils Absolute: 0.1 10*3/uL (ref 0.0–0.5)
Eosinophils Relative: 2 %
HCT: 32.8 % — ABNORMAL LOW (ref 39.0–52.0)
Hemoglobin: 10.4 g/dL — ABNORMAL LOW (ref 13.0–17.0)
Immature Granulocytes: 0 %
Lymphocytes Relative: 37 %
Lymphs Abs: 2.2 10*3/uL (ref 0.7–4.0)
MCH: 30.2 pg (ref 26.0–34.0)
MCHC: 31.7 g/dL (ref 30.0–36.0)
MCV: 95.3 fL (ref 80.0–100.0)
Monocytes Absolute: 0.8 10*3/uL (ref 0.1–1.0)
Monocytes Relative: 13 %
Neutro Abs: 2.9 10*3/uL (ref 1.7–7.7)
Neutrophils Relative %: 48 %
Platelets: 265 10*3/uL (ref 150–400)
RBC: 3.44 MIL/uL — ABNORMAL LOW (ref 4.22–5.81)
RDW: 12.3 % (ref 11.5–15.5)
WBC: 6 10*3/uL (ref 4.0–10.5)
nRBC: 0 % (ref 0.0–0.2)

## 2019-08-14 LAB — POTASSIUM: Potassium: 3.5 mmol/L (ref 3.5–5.1)

## 2019-08-14 LAB — GLUCOSE, CAPILLARY
Glucose-Capillary: 104 mg/dL — ABNORMAL HIGH (ref 70–99)
Glucose-Capillary: 112 mg/dL — ABNORMAL HIGH (ref 70–99)
Glucose-Capillary: 120 mg/dL — ABNORMAL HIGH (ref 70–99)
Glucose-Capillary: 125 mg/dL — ABNORMAL HIGH (ref 70–99)
Glucose-Capillary: 157 mg/dL — ABNORMAL HIGH (ref 70–99)
Glucose-Capillary: 176 mg/dL — ABNORMAL HIGH (ref 70–99)

## 2019-08-14 LAB — MAGNESIUM: Magnesium: 1.6 mg/dL — ABNORMAL LOW (ref 1.7–2.4)

## 2019-08-14 LAB — HEMOGLOBIN AND HEMATOCRIT, BLOOD
HCT: 33.1 % — ABNORMAL LOW (ref 39.0–52.0)
Hemoglobin: 10.6 g/dL — ABNORMAL LOW (ref 13.0–17.0)

## 2019-08-14 MED ORDER — RESOURCE THICKENUP CLEAR PO POWD: ORAL | Status: DC

## 2019-08-14 MED ORDER — AMLODIPINE BESYLATE 10 MG PO TABS: 10.0000 mg | ORAL_TABLET | Freq: Every day | ORAL | 0 refills | Status: DC

## 2019-08-14 MED ORDER — ENSURE ENLIVE PO LIQD: 237.0000 mL | Freq: Two times a day (BID) | ORAL | 12 refills | Status: DC

## 2019-08-14 MED ORDER — ENSURE ENLIVE PO LIQD
237.0000 mL | Freq: Two times a day (BID) | ORAL | Status: DC
  Administered 2019-08-14 – 2019-08-17 (×5): 237 mL via ORAL

## 2019-08-14 MED ORDER — DM-GUAIFENESIN ER 30-600 MG PO TB12: 1.0000 | ORAL_TABLET | Freq: Two times a day (BID) | ORAL | 1 refills | Status: DC

## 2019-08-14 MED ORDER — ADULT MULTIVITAMIN W/MINERALS CH: 1.0000 | ORAL_TABLET | Freq: Every day | ORAL | Status: DC

## 2019-08-14 MED ORDER — OSMOLITE 1.5 CAL PO LIQD
1000.0000 mL | ORAL | Status: AC
  Administered 2019-08-14: 1000 mL
  Filled 2019-08-14 (×2): qty 1000

## 2019-08-14 MED ORDER — SENNOSIDES-DOCUSATE SODIUM 8.6-50 MG PO TABS: 1.0000 | ORAL_TABLET | Freq: Every evening | ORAL | 0 refills | Status: DC | PRN

## 2019-08-14 MED ORDER — PREDNISONE 20 MG PO TABS: ORAL_TABLET | ORAL | 0 refills | Status: DC

## 2019-08-14 MED ORDER — BACITRACIN-NEOMYCIN-POLYMYXIN 400-5-5000 EX OINT: 1.0000 "application " | TOPICAL_OINTMENT | Freq: Every day | CUTANEOUS | 0 refills | Status: DC

## 2019-08-14 MED ORDER — OSMOLITE 1.5 CAL PO LIQD: 1000.0000 mL | ORAL | 0 refills | Status: DC

## 2019-08-14 MED ORDER — NICOTINE 21 MG/24HR TD PT24: 21.0000 mg | MEDICATED_PATCH | Freq: Every day | TRANSDERMAL | 0 refills | Status: DC

## 2019-08-14 NOTE — Discharge Summary (Signed)
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 Soft, NT, ND, bowel sounds +PEG in place.  Extremities: no edema, no cyanosis    The results of significant diagnostics from this hospitalization (including imaging, microbiology, ancillary and laboratory) are listed below for reference.     Microbiology: Recent Results (from the past 240 hour(s))  Surgical PCR screen     Status: None   Collection Time: 08/07/19 10:15 PM   Specimen: Nasal Mucosa; Nasal Swab  Result Value Ref Range Status   MRSA, PCR NEGATIVE NEGATIVE Final   Staphylococcus aureus NEGATIVE NEGATIVE Final    Comment: (NOTE) The Xpert SA Assay (FDA approved for NASAL specimens in patients 16 years of age and older), is one component of a comprehensive surveillance program. It is not intended to diagnose infection nor to guide or monitor treatment. Performed at [facility], [place] [address], [place], [place] [address]   SARS CORONAVIRUS 2 Nasal Swab Aptima Multi Swab     Status: None   Collection Time: 08/11/19  5:52 PM   Specimen: Aptima Multi Swab; Nasal Swab  Result Value Ref Range Status   SARS Coronavirus 2  NEGATIVE NEGATIVE Final    Comment: (NOTE) SARS-CoV-2 target nucleic acids are NOT DETECTED. The SARS-CoV-2 RNA is generally detectable in upper and lower respiratory specimens during the acute phase of infection. Negative results do not preclude SARS-CoV-2 infection, do not rule out co-infections with other pathogens, and should not be used as the sole basis for treatment or other patient management decisions. Negative results must be combined with clinical observations, patient history, and epidemiological information. The expected result is Negative. Fact Sheet for Patients: SugarRoll.be Fact Sheet for Healthcare Providers: https://www.woods-mathews.com/ This test is not yet approved or cleared by  the [employer] and  has been authorized for detection and/or diagnosis of SARS-CoV-2 by FDA under an Emergency Use Authorization (EUA). This EUA will remain  in effect (meaning this test can be used) for the duration of the COVID-19 declaration under Section 56 4(b)(1) of the Act, 21 U.S.C. section 360bbb-3(b)(1), unless the authorization is terminated or revoked sooner. Performed at [facility], [place] [address] [place], [place] [address]      Labs: BNP (last 3 results) No results for input(s): BNP in the last 8760 hours. Basic Metabolic Panel: Recent Labs  Lab 08/10/19 0231 08/11/19 0543 08/12/19 0949 08/13/19 0547 08/14/19 1418 08/16/19 0513  NA 140 138 139 137  --  136  K 3.6 3.1* 3.1* 3.3* 3.5 3.7  CL 103 102 99 99  --  99  CO2 29 30 30 28   --  30  GLUCOSE 147* 106* 89 93  --  147*  BUN 15 8 9 9   --  14  CREATININE 0.75 0.55* 0.54* 0.49*  --  0.51*  CALCIUM 8.0* 8.0* 8.2* 8.3*  --  8.6*  MG 2.0  --  2.0  --  1.6*  --    Liver Function Tests: No results for input(s): AST, ALT, ALKPHOS, BILITOT, PROT, ALBUMIN in the last 168 hours. No results for input(s): LIPASE, AMYLASE in the last 168 hours. No results for  input(s): AMMONIA in the last 168 hours. CBC: Recent Labs  Lab 08/11/19 0543 08/12/19 0949 08/13/19 0547 08/14/19 0704 08/14/19 1418 08/15/19 0547  WBC 6.2 6.8 8.3 6.0  --  8.3  NEUTROABS 3.7 3.9 5.7 2.9  --  5.1  HGB 11.9* 12.5* 11.4* 10.4* 10.6* 10.6*  HCT 38.4* 40.6 36.1* 32.8* 33.1* 33.7*  MCV 95.8 96.9 93.3 95.3  --  97.1  PLT 238 272 258 265  --  255   Cardiac Enzymes: No results for input(s): CKTOTAL, CKMB, CKMBINDEX, TROPONINI in the last 168 hours. BNP: Invalid input(s): POCBNP CBG: Recent Labs  Lab 08/15/19 2314 08/16/19 0512 08/16/19 0742 08/16/19 1150 08/16/19 1629  GLUCAP 156* 144* 138* 109* 130*   D-Dimer No results for input(s): DDIMER in the last 72 hours. Hgb A1c No results for input(s): HGBA1C in the last 72 hours. Lipid Profile No results for input(s): CHOL, HDL, LDLCALC, TRIG, CHOLHDL, LDLDIRECT in the last 72 hours. Thyroid function studies No results for input(s): TSH, T4TOTAL, T3FREE, THYROIDAB in the last 72 hours.  Invalid input(s): FREET3 Anemia work up No results for input(s): VITAMINB12, FOLATE, FERRITIN, TIBC, IRON, RETICCTPCT in the last 72 hours. Urinalysis    Component Value Date/Time   COLORURINE YELLOW 10/17/2011 2052   APPEARANCEUR CLEAR 10/17/2011 2052   LABSPEC 1.024 10/17/2011 2052   PHURINE 6.5 10/17/2011 2052   GLUCOSEU NEGATIVE 10/17/2011 2052   HGBUR TRACE (A) 10/17/2011 2052   BILIRUBINUR NEGATIVE 10/17/2011 2052   KETONESUR NEGATIVE 10/17/2011 2052   PROTEINUR NEGATIVE 10/17/2011 2052   UROBILINOGEN 0.2 10/17/2011 2052   NITRITE NEGATIVE 10/17/2011 2052   LEUKOCYTESUR NEGATIVE 10/17/2011 2052   Sepsis Labs Invalid input(s): PROCALCITONIN,  WBC,  LACTICIDVEN Microbiology Recent Results (from the past 240 hour(s))  Surgical PCR screen     Status: None   Collection Time: 08/07/19 10:15 PM   Specimen: Nasal Mucosa; Nasal Swab  Result Value Ref Range Status   MRSA, PCR NEGATIVE NEGATIVE Final   Staphylococcus  aureus NEGATIVE NEGATIVE Final    Comment: (NOTE) The Xpert SA Assay (FDA approved for  NASAL specimens in patients 44 years of age and older), is one component of a comprehensive surveillance program. It is not intended to diagnose infection nor to guide or monitor treatment. Performed at [facility], [place] [address], [place], [place] [address]   SARS CORONAVIRUS 2 Nasal Swab Aptima Multi Swab     Status: None   Collection Time: 08/11/19  5:52 PM   Specimen: Aptima Multi Swab; Nasal Swab  Result Value Ref Range Status   SARS Coronavirus 2 NEGATIVE NEGATIVE Final    Comment: (NOTE) SARS-CoV-2 target nucleic acids are NOT DETECTED. The SARS-CoV-2 RNA is generally detectable in upper and lower respiratory specimens during the acute phase of infection. Negative results do not preclude SARS-CoV-2 infection, do not rule out co-infections with other pathogens, and should not be used as the sole basis for treatment or other patient management decisions. Negative results must be combined with clinical observations, patient history, and epidemiological information. The expected result is Negative. Fact Sheet for Patients: SugarRoll.be Fact Sheet for Healthcare Providers: https://www.woods-mathews.com/ This test is not yet approved or cleared by the [place] FDA and  has been authorized for detection and/or diagnosis of SARS-CoV-2 by FDA under an Emergency Use Authorization (EUA). This EUA will remain  in effect (meaning this test can be used) for the duration of the COVID-19 declaration under Section 56 4(b)(1) of the Act, 21 U.S.C. section 360bbb-3(b)(1), unless the authorization is terminated or revoked sooner. Performed at [facility], [place] [address], [place], [place] [address]      Time coordinating discharge: 31 minutes  SIGNED:   Hosie Poisson, MD  [facility] 08/16/2019, 8:35 PM Pager   If 7PM-7AM, please  contact night-coverage [email or web address] Password [email or web address]

## 2019-08-14 NOTE — Progress Notes (Signed)
Nutrition Follow-up  DOCUMENTATION CODES:   Severe malnutrition in context of chronic illness, Underweight  INTERVENTION:   8/16: -Switch formula to Osmolite 1.5 @ 40 ml/hr via G-tube -If tolerates, will transition 8/17 to 16 hour feeds to allow time off the pump for meals.  8/17: Initiate Osmolite 1.5 @ 50 ml/hr x 16 hours (1800-1000) via G-tube 8/18: Advance to Osmolite 1.5 @ 65 ml/hr x 16 hours (1800-1000) via G-tube  Goal rate of 65 ml/hr x 16 hours provides 1560 kcal (82% of needs), 65g protein (68% of needs) and 792 ml H2O.  -Continue Ensure Enlive po BID, each supplement provides 350 kcal and 20 grams of protein -Continue Magic cup TID with meals, each supplement provides 290 kcal and 9 grams of protein -Continue MVI  NUTRITION DIAGNOSIS:   Severe Malnutrition related to chronic illness as evidenced by energy intake < 75% for > or equal to 3 months, percent weight loss, per patient/family report.  Ongoing.  GOAL:   Patient will meet greater than or equal to 90% of their needs, Weight gain  Progressing.  MONITOR:   PO intake, Labs, I & O's, Supplement acceptance, Weight trends  REASON FOR ASSESSMENT:   Consult Enteral/tube feeding initiation and management  ASSESSMENT:   58 year old male with medical history significant of COPD and arthritis presented to ED  with SOB, neck swelling, reported 40lb wt loss in the past several months, and 2 week history of constipation. Patient seen by outpt provider yesterday who referred him CT of neck; based on imaging there were concerns for airway compromise and patient instructed to go to ER.  8/14: s/p G-tube placed  **RD working remotely**  Tube feeding protocol has been initiated 8/15. Pt currently receiving Jevity 1.2 @ 30 ml/hr. Patient has been consuming 30-40% of meals. Pt is also drinking at least 2 Ensures daily. Given his PO intake, will order tube feeding to only provide ~75% of estimated needs. To allow for meals  during the day, will aim for 16 hour feeds to allow time off the pump.  No new weights measured for this admission since 8/8.  Medications: Multivitamin with minerals daily  Labs reviewed: CBGs: 120-125 Low K  Diet Order:   Diet Order            DIET DYS 3 Room service appropriate? Yes; Fluid consistency: Nectar Thick  Diet effective now              EDUCATION NEEDS:   Education needs have been addressed  Skin:  Skin Assessment: Reviewed RN Assessment  Last BM:  8/15  Height:   Ht Readings from Last 1 Encounters:  08/06/19 5\' 5"  (1.651 m)    Weight:   Wt Readings from Last 1 Encounters:  08/06/19 47 kg    Ideal Body Weight:  61.6 kg  BMI:  Body mass index is 17.24 kg/m.  Estimated Nutritional Needs:   Kcal:  1900-2100  Protein:  95-105  Fluid:  >1.9L  Clayton Bibles, MS, RD, LDN [place] Dietitian Pager: [phone] After Hours Pager: [phone]
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
## 2019-08-14 NOTE — Progress Notes (Signed)
PROGRESS NOTE    Perry Waller  XYV:[id number] DOB: 03-13-1961 DOA: 08/05/2019 PCP: [name], FNP   Brief Narrative:  58 year old male with prior h/o COPD, tobacco abuse, arthritis presents with sob and neck swelling associated with weight loss . CT neck showed large laryngeal mass with air way compromise, admitted to the [facility] for further evaluation. ENT consulted, underwent tracheostomy and biopsy showing squamous cell carcinoma. Oncology consulted, recommendations were for laryngectomy but patient refused, chemotherapy and radiation therapy.  Radiation therapy starting next week. No plans for chemotherapy. SLP evaluation recommending soft diet with honey nectar liquids.   Meanwhile as per oncology recommendations,  IR consulted for PEG placement,.  He underwent PEG placement on 08/12/2019.  Palliative care consulted and awaiting recommendations  8/16 Patient seen and examined today he appears to be in better spirits, TUBE FEEDS started, nutrition on board. Home health PT, RN, SLP , resp care ordered. Waiting for everything to be arranged so he can be discharged.    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  Radiation treatments to start next week if patient is agreeable.  Oncology further suggests PEG placement as his radiation therapy is going to worsen his dysphagia.  PEG placement done on  08/12/2019 and tube feeds were started on 08/13/2019 as per nutrition recommendations. Pain control with tramadol for neck pain is adequately controlling his pain.   Acute COPD exacerbation    Wheezing is much better and he is currently on oral steroids for a quick taper. Continue with mucinex for tracheal secreations.   Severe Protein calorie malnutrition:  - nutrition on board, SLP on board. IR consulted to see if he will benefit from PEG for nutrition, as his radiation therapy might worsen his dysphagia.  Underwent PEG placement on 08/12/2019 and tube feeds started on 8/15. Slowly increase the tube feeds to the appropriate rate for optimal .     Hypokalemia:  Replaced.repeat today. Get magnesium levels.    Hypertension:  Well controlled. Resume Norvasc 10 mg daily.   Tobacco abuse  On Nicotine patch.  Constipation: resume senna, colace and miralax. .   Mild anemia of chronic disease Hemoglobin stable between 10 to 12.  No signs of bleeding from the trach site.     DVT prophylaxis: Lovenox.  Code Status: FULL CODE.  Family Communication: NONE AT BEDSIDE. Discussed with sister over the phone.  Disposition Plan: discharge home once home health arrangements are taken care of.   Consultants:   ENT  IR  Oncology  Radiation oncology.   Palliative care consult   Procedures: Tracheostomy  By Dr Janace Hoard on 8/10   Antimicrobials: none.    Subjective: Breathing better, able to suction by himself.  Cough is better, pain is improving.  SLP eval for PSV request.    Objective: Vitals:   08/13/19 2100 08/14/19 0533 08/14/19 0810 08/14/19 1215  BP:  128/86    Pulse:  69    Resp:  18    Temp:  98.1 F (36.7 C)    TempSrc:  Oral  SpO2: 100% 100% 100% 100%  Weight:      Height:        Intake/Output Summary (Last 24 hours) at 08/14/2019 1400 Last data filed at 08/14/2019 0500 Gross per 24 hour  Intake 522.17 ml  Output 500 ml  Net 22.17 ml   Filed Weights   08/06/19 0120  Weight: 47 kg    Examination:  General exam:  Cachetic looking gentleman s/p trach on oxygen.  Respiratory system: air entry fair, scattered rhonchi at bases.   Cardiovascular system: S1 S2 heard,RRR, no pedal edema.  Gastrointestinal system: abd is soft, NT nd bs+ Central nervous system: alert and oriented and communicating by writing.  Extremities; no pedal edema, cyanosis or clubbing.  Skin: no rashes seen.  Psychiatry: mood appropriate.     Data Reviewed: I have personally reviewed following labs and imaging studies  CBC: Recent Labs  Lab 08/10/19 0231 08/11/19 0543 08/12/19 0949 08/13/19 0547 08/14/19 0704  WBC 9.4 6.2 6.8 8.3 6.0  NEUTROABS 6.7 3.7 3.9 5.7 2.9  HGB 11.5* 11.9* 12.5* 11.4* 10.4*  HCT 36.6* 38.4* 40.6 36.1* 32.8*  MCV 96.8 95.8 96.9 93.3 95.3  PLT 254 238 272 258 448   Basic Metabolic Panel: Recent Labs  Lab 08/08/19 0325 08/09/19 0834 08/10/19 0231 08/11/19 0543 08/12/19 0949 08/13/19 0547  NA 139 137 140 138 139 137  K 4.2 3.6 3.6 3.1* 3.1* 3.3*  CL 104 100 103 102 99 99  CO2 27 28 29 30 30 28   GLUCOSE 166* 140* 147* 106* 89 93  BUN 7 8 15 8 9 9   CREATININE 0.65 0.59* 0.75 0.55* 0.54* 0.49*  CALCIUM 8.4* 8.4* 8.0* 8.0* 8.2* 8.3*  MG 1.7  --  2.0  --  2.0  --    GFR: Estimated Creatinine Clearance: 66.9 mL/min (A) (by C-G formula based on SCr of 0.49 mg/dL (L)). Liver Function Tests: No results for input(s): AST, ALT, ALKPHOS, BILITOT, PROT, ALBUMIN in the last 168 hours. No results for input(s): LIPASE, AMYLASE in the last 168 hours. No results for input(s): AMMONIA in the last 168 hours. Coagulation Profile: Recent Labs  Lab 08/12/19 0949  INR 0.9   Cardiac Enzymes: No results for input(s): CKTOTAL, CKMB, CKMBINDEX, TROPONINI in the last 168 hours. BNP (last 3 results) No results for input(s): PROBNP in the last 8760 hours. HbA1C: No results for input(s): HGBA1C in the last 72 hours. CBG: Recent Labs  Lab 08/13/19 2120 08/14/19 0044 08/14/19 0404 08/14/19 0749 08/14/19 1158  GLUCAP 133* 112* 125* 120* 104*   Lipid Profile: No results for input(s): CHOL, HDL, LDLCALC, TRIG,  CHOLHDL, LDLDIRECT in the last 72 hours. Thyroid Function Tests: No results for input(s): TSH, T4TOTAL, FREET4, T3FREE, THYROIDAB in the last 72 hours. Anemia Panel: No results for input(s): VITAMINB12, FOLATE, FERRITIN, TIBC, IRON, RETICCTPCT in the last 72 hours. Sepsis Labs: No results for input(s): PROCALCITON, LATICACIDVEN in the last 168 hours.  Recent Results (from the past 240 hour(s))  SARS Coronavirus 2 [facility] order, Performed in [facility] hospital lab) Nasopharyngeal Nasopharyngeal Swab     Status: None   Collection Time: 08/05/19  7:35 PM   Specimen: Nasopharyngeal Swab  Result Value Ref Range Status   SARS Coronavirus 2 NEGATIVE NEGATIVE Final    Comment: (NOTE) If result is NEGATIVE SARS-CoV-2 target nucleic acids are NOT DETECTED. The SARS-CoV-2 RNA is generally detectable in upper and lower  respiratory specimens during the acute phase of infection. The lowest  concentration  of SARS-CoV-2 viral copies this assay can detect is 250  copies / mL. A negative result does not preclude SARS-CoV-2 infection  and should not be used as the sole basis for treatment or other  patient management decisions.  A negative result may occur with  improper specimen collection / handling, submission of specimen other  than nasopharyngeal swab, presence of viral mutation(s) within the  areas targeted by this assay, and inadequate number of viral copies  (<250 copies / mL). A negative result must be combined with clinical  observations, patient history, and epidemiological information. If result is POSITIVE SARS-CoV-2 target nucleic acids are DETECTED. The SARS-CoV-2 RNA is generally detectable in upper and lower  respiratory specimens dur ing the acute phase of infection.  Positive  results are indicative of active infection with SARS-CoV-2.  Clinical  correlation with patient history and other diagnostic information is  necessary to determine patient infection status.  Positive  results do  not rule out bacterial infection or co-infection with other viruses. If result is PRESUMPTIVE POSTIVE SARS-CoV-2 nucleic acids MAY BE PRESENT.   A presumptive positive result was obtained on the submitted specimen  and confirmed on repeat testing.  While 2019 novel coronavirus  (SARS-CoV-2) nucleic acids may be present in the submitted sample  additional confirmatory testing may be necessary for epidemiological  and / or clinical management purposes  to differentiate between  SARS-CoV-2 and other Sarbecovirus currently known to infect humans.  If clinically indicated additional testing with an alternate test  methodology [phone]) is advised. The SARS-CoV-2 RNA is generally  detectable in upper and lower respiratory sp ecimens during the acute  phase of infection. The expected result is Negative. Fact Sheet for Patients:  StrictlyIdeas.no Fact Sheet for Healthcare Providers: BankingDealers.co.za This test is not yet approved or cleared by the [place] FDA and has been authorized for detection and/or diagnosis of SARS-CoV-2 by FDA under an Emergency Use Authorization (EUA).  This EUA will remain in effect (meaning this test can be used) for the duration of the COVID-19 declaration under Section 564(b)(1) of the Act, 21 U.S.C. section 360bbb-3(b)(1), unless the authorization is terminated or revoked sooner. Performed at [facility], [place] [address], [place], [place] [address]   Respiratory Panel by PCR     Status: None   Collection Time: 08/06/19  1:48 AM   Specimen: Nasopharyngeal Swab; Respiratory  Result Value Ref Range Status   Adenovirus NOT DETECTED NOT DETECTED Final   Coronavirus 229E NOT DETECTED NOT DETECTED Final    Comment: (NOTE) The Coronavirus on the Respiratory Panel, DOES NOT test for the novel  Coronavirus (2019 nCoV)    Coronavirus HKU1 NOT DETECTED NOT DETECTED Final   Coronavirus NL63 NOT  DETECTED NOT DETECTED Final   Coronavirus OC43 NOT DETECTED NOT DETECTED Final   Metapneumovirus NOT DETECTED NOT DETECTED Final   Rhinovirus / Enterovirus NOT DETECTED NOT DETECTED Final   Influenza A NOT DETECTED NOT DETECTED Final   Influenza B NOT DETECTED NOT DETECTED Final   Parainfluenza Virus 1 NOT DETECTED NOT DETECTED Final   Parainfluenza Virus 2 NOT DETECTED NOT DETECTED Final   Parainfluenza Virus 3 NOT DETECTED NOT DETECTED Final   Parainfluenza Virus 4 NOT DETECTED NOT DETECTED Final   Respiratory Syncytial Virus NOT DETECTED NOT DETECTED Final   Bordetella pertussis NOT DETECTED NOT DETECTED Final   Chlamydophila pneumoniae NOT DETECTED NOT DETECTED Final   Mycoplasma pneumoniae NOT DETECTED NOT DETECTED Final  Comment: Performed at [facility], [place] [address], [place], [place] [address]  Surgical PCR screen     Status: None   Collection Time: 08/07/19 10:15 PM   Specimen: Nasal Mucosa; Nasal Swab  Result Value Ref Range Status   MRSA, PCR NEGATIVE NEGATIVE Final   Staphylococcus aureus NEGATIVE NEGATIVE Final    Comment: (NOTE) The Xpert SA Assay (FDA approved for NASAL specimens in patients 89 years of age and older), is one component of a comprehensive surveillance program. It is not intended to diagnose infection nor to guide or monitor treatment. Performed at [facility], [place] [address], [place], [place] [address]   SARS CORONAVIRUS 2 Nasal Swab Aptima Multi Swab     Status: None   Collection Time: 08/11/19  5:52 PM   Specimen: Aptima Multi Swab; Nasal Swab  Result Value Ref Range Status   SARS Coronavirus 2 NEGATIVE NEGATIVE Final    Comment: (NOTE) SARS-CoV-2 target nucleic acids are NOT DETECTED. The SARS-CoV-2 RNA is generally detectable in upper and lower respiratory specimens during the acute phase of infection. Negative results do not preclude SARS-CoV-2 infection, do not rule out co-infections with other pathogens, and should not be  used as the sole basis for treatment or other patient management decisions. Negative results must be combined with clinical observations, patient history, and epidemiological information. The expected result is Negative. Fact Sheet for Patients: SugarRoll.be Fact Sheet for Healthcare Providers: https://www.woods-mathews.com/ This test is not yet approved or cleared by the [place] FDA and  has been authorized for detection and/or diagnosis of SARS-CoV-2 by FDA under an Emergency Use Authorization (EUA). This EUA will remain  in effect (meaning this test can be used) for the duration of the COVID-19 declaration under Section 56 4(b)(1) of the Act, 21 U.S.C. section 360bbb-3(b)(1), unless the authorization is terminated or revoked sooner. Performed at [facility], [place] [address], [place], [place] [address]          Radiology Studies: No results found.      Scheduled Meds: . amLODipine  10 mg Oral Daily  . Chlorhexidine Gluconate Cloth  6 each Topical Daily  . dextromethorphan-guaiFENesin  1 tablet Oral BID  . enoxaparin (LOVENOX) injection  30 mg Subcutaneous Q24H  . feeding supplement (ENSURE ENLIVE)  237 mL Oral BID BM  . feeding supplement (OSMOLITE 1.5 CAL)  1,000 mL Per Tube Q24H  . insulin aspart  0-9 Units Subcutaneous TID WC  . multivitamin with minerals  1 tablet Oral Daily  . neomycin-bacitracin-polymyxin  1 application Topical Daily  . nicotine  21 mg Transdermal Daily  . polyethylene glycol  17 g Oral Daily  . predniSONE  40 mg Oral QAC breakfast   Continuous Infusions:    LOS: 9 days    Time spent: 30 minutes    Hosie Poisson, MD [facility] Pager [phone]   If 7PM-7AM, please contact night-coverage [email or web address] Password [email or web address] 08/14/2019, 2:00 PM

## 2019-08-15 LAB — CBC WITH DIFFERENTIAL/PLATELET
Abs Immature Granulocytes: 0.04 10*3/uL (ref 0.00–0.07)
Basophils Absolute: 0 10*3/uL (ref 0.0–0.1)
Basophils Relative: 0 %
Eosinophils Absolute: 0.1 10*3/uL (ref 0.0–0.5)
Eosinophils Relative: 1 %
HCT: 33.7 % — ABNORMAL LOW (ref 39.0–52.0)
Hemoglobin: 10.6 g/dL — ABNORMAL LOW (ref 13.0–17.0)
Immature Granulocytes: 1 %
Lymphocytes Relative: 25 %
Lymphs Abs: 2.1 10*3/uL (ref 0.7–4.0)
MCH: 30.5 pg (ref 26.0–34.0)
MCHC: 31.5 g/dL (ref 30.0–36.0)
MCV: 97.1 fL (ref 80.0–100.0)
Monocytes Absolute: 1 10*3/uL (ref 0.1–1.0)
Monocytes Relative: 12 %
Neutro Abs: 5.1 10*3/uL (ref 1.7–7.7)
Neutrophils Relative %: 61 %
Platelets: 255 10*3/uL (ref 150–400)
RBC: 3.47 MIL/uL — ABNORMAL LOW (ref 4.22–5.81)
RDW: 12.4 % (ref 11.5–15.5)
WBC: 8.3 10*3/uL (ref 4.0–10.5)
nRBC: 0 % (ref 0.0–0.2)

## 2019-08-15 LAB — GLUCOSE, CAPILLARY
Glucose-Capillary: 109 mg/dL — ABNORMAL HIGH (ref 70–99)
Glucose-Capillary: 111 mg/dL — ABNORMAL HIGH (ref 70–99)
Glucose-Capillary: 118 mg/dL — ABNORMAL HIGH (ref 70–99)
Glucose-Capillary: 131 mg/dL — ABNORMAL HIGH (ref 70–99)
Glucose-Capillary: 141 mg/dL — ABNORMAL HIGH (ref 70–99)
Glucose-Capillary: 156 mg/dL — ABNORMAL HIGH (ref 70–99)
Glucose-Capillary: 171 mg/dL — ABNORMAL HIGH (ref 70–99)
Glucose-Capillary: 175 mg/dL — ABNORMAL HIGH (ref 70–99)

## 2019-08-15 MED ORDER — MAGNESIUM SULFATE 2 GM/50ML IV SOLN
2.0000 g | Freq: Once | INTRAVENOUS | Status: AC
  Administered 2019-08-15: 2 g via INTRAVENOUS
  Filled 2019-08-15: qty 50

## 2019-08-15 MED ORDER — BENZONATATE 100 MG PO CAPS
200.0000 mg | ORAL_CAPSULE | Freq: Three times a day (TID) | ORAL | Status: DC | PRN
  Administered 2019-08-15: 200 mg via ORAL
  Filled 2019-08-15: qty 2

## 2019-08-15 MED ORDER — OSMOLITE 1.5 CAL PO LIQD
1000.0000 mL | ORAL | Status: DC
  Filled 2019-08-15 (×3): qty 1000

## 2019-08-15 MED ORDER — GUAIFENESIN 100 MG/5ML PO SOLN: 5.0000 mL | ORAL | Status: DC | PRN

## 2019-08-15 NOTE — Progress Notes (Signed)
Occupational Therapy Treatment Patient Details Name: Perry Waller MRN: [id number] DOB: 02-28-1961 Today's Date: 08/15/2019    History of present illness Perry Waller is a 58 y.o. male who presents to [facility] with dysphagia and squamous cell carcinoma. PMH consist of COPD, acute resp. acidosis, and OA.   OT comments  Pt slowly progressing toward OT goals. Session involved functional mobility and activity tolerance for ADL engagement. Pt was observed to have some instability, however, able to self recover. No significant decrease in O2 levels, and pt did not complain of fatigue. Pt will benefit from continued acute OT to address safety and to maximize independence in ADLs. DC and freq remains the same. OT will continue to follow acutely.    Follow Up Recommendations  Home health OT    Equipment Recommendations  3 in 1 bedside commode    Recommendations for Other Services Speech consult    Precautions / Restrictions Precautions Precautions: Fall;Other (comment) Precaution Comments: trach/T-collar Restrictions Weight Bearing Restrictions: No       Mobility Bed Mobility Overal bed mobility: Independent             General bed mobility comments: no physical assistance required.  Transfers Overall transfer level: Needs assistance Equipment used: Rolling walker (2 wheeled) Transfers: Sit to/from Stand Sit to Stand: Supervision         General transfer comment: for lines/safety, no physical assist    Balance Overall balance assessment: Needs assistance Sitting-balance support: No upper extremity supported;Feet unsupported Sitting balance-Leahy Scale: Good     Standing balance support: During functional activity;No upper extremity supported Standing balance-Leahy Scale: Fair Standing balance comment: unable to tolerate mod challenges               High Level Balance Comments: mild gait instability with delayed reactions but able to self recover           ADL either  performed or assessed with clinical judgement   ADL Overall ADL's : Needs assistance/impaired                         Toilet Transfer: Min guard           Functional mobility during ADLs: Min guard General ADL Comments: Min gaurd for functional mobility due to some instability when walking with no AE. Simulated toilet transfer from bed to chair. No decrease in O2 levels with in the 96-99 range     Vision       Perception     Praxis      Cognition Arousal/Alertness: Awake/alert Behavior During Therapy: WFL for tasks assessed/performed Overall Cognitive Status: Within Functional Limits for tasks assessed                                 General Comments: very pleasant this session.        Exercises     Shoulder Instructions       General Comments      Pertinent Vitals/ Pain       Pain Assessment: No/denies pain Pain Location: neck Pain Descriptors / Indicators: Aching Pain Intervention(s): Monitored during session;Repositioned  Home Living                                          Prior Functioning/Environment  Frequency  Min 1X/week        Progress Toward Goals  OT Goals(current goals can now be found in the care plan section)  Progress towards OT goals: Progressing toward goals  Acute Rehab OT Goals Patient Stated Goal: no goal stated OT Goal Formulation: With patient Time For Goal Achievement: 08/27/19 Potential to Achieve Goals: Good ADL Goals Pt Will Perform Grooming: Independently;standing Pt Will Transfer to Toilet: Independently;ambulating;regular height toilet Pt/caregiver will Perform Home Exercise Program: Increased strength;Both right and left upper extremity;With theraband;Independently;With written HEP provided  Plan Discharge plan remains appropriate;Frequency remains appropriate    Co-evaluation                 AM-PAC OT "6 Clicks" Daily Activity     Outcome  Measure   Help from another person eating meals?: None Help from another person taking care of personal grooming?: None Help from another person toileting, which includes using toliet, bedpan, or urinal?: None Help from another person bathing (including washing, rinsing, drying)?: None Help from another person to put on and taking off regular upper body clothing?: None Help from another person to put on and taking off regular lower body clothing?: None 6 Click Score: 24    End of Session Equipment Utilized During Treatment: Gait belt  OT Visit Diagnosis: Muscle weakness (generalized) (M62.81)   Activity Tolerance Patient tolerated treatment well   Patient Left with call bell/phone within reach;with nursing/sitter in room;in chair   Nurse Communication Mobility status        Time: 8206-0156 OT Time Calculation (min): 31 min  Charges: OT General Charges $OT Visit: 1 Visit OT Treatments $Self Care/Home Management : 23-37 mins  Minus Breeding, MSOT, OTR/L  Supplemental Rehabilitation Services  [phone]   Marius Ditch 08/15/2019, 11:31 AM

## 2019-08-15 NOTE — Progress Notes (Signed)
PROGRESS NOTE    Perry Waller  PIR:[id number] DOB: [birth year] DOA: 08/05/2019 PCP: [name], FNP   Brief Narrative:  58 year old male with prior h/o COPD, tobacco abuse, arthritis presents with sob and neck swelling associated with weight loss . CT neck showed large laryngeal mass with air way compromise, admitted to the [facility] for further evaluation. ENT consulted, underwent tracheostomy and biopsy showing squamous cell carcinoma. Oncology consulted, recommendations were for laryngectomy but patient refused, chemotherapy and radiation therapy.  Radiation therapy starting next week. No plans for chemotherapy. SLP evaluation recommending dysphagia 3 diet with nectar thick liquids.  Meanwhile as per oncology recommendations,  IR consulted for PEG placement,.  He underwent PEG placement on 08/12/2019.  Palliative care consulted, recommendations are for outpatient follow up on discharge.   Tube feeds set up . Currently waiting for ENT to change the cuffed trach to cuff less prior to discharge.  SLP eval requested fro [facility] prior to discharge.    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  Radiation treatments to start next week if patient is agreeable.  Oncology further suggests PEG placement as his radiation therapy is going to worsen his dysphagia.  PEG placement done on  08/12/2019 and tube feeds were started on 08/13/2019 as per nutrition recommendations. Pain control with tramadol for neck pain is adequately controlling his pain.  Waiting for ENT to change the cuffed trach tube to smaller  cuffless prior to discharge.  Dr Janace Hoard office requested.     Acute COPD exacerbation   Wheezing improved. On prednisone taper. Continue with mucinex for tracheal secreations.   Severe Protein calorie malnutrition:  - nutrition on board, SLP on board. IR consulted to see if he will benefit from PEG for nutrition, as his radiation therapy might worsen his dysphagia.  Underwent PEG placement on 08/12/2019 and tube feeds started on 8/15. Slowly increase the tube feeds to optimal rate by discharge.     Hypokalemia:  Replaced.  Hypomagnesemia: replaced.    Hypertension:  Well controlled. Resume Norvasc 10 mg daily.   Tobacco abuse  On Nicotine patch.  Constipation: resume senna, colace and miralax. .   Mild anemia of chronic disease Hemoglobin stable between 10 to 12.  No signs of bleeding from the trach site.     DVT prophylaxis: Lovenox.  Code Status: FULL CODE.  Family Communication: NONE AT BEDSIDE.  Discussed with sister over the phone.  Disposition Plan: discharge home once home health arrangements are taken care of and after changing to cuff less by ENT   Consultants:   ENT  IR  Oncology  Radiation oncology.   Palliative care consult   Procedures: Tracheostomy  By Dr Janace Hoard on 8/10   Antimicrobials: none.    Subjective: No new complaints wanted me to call his sister and update her.    Objective: Vitals:   08/15/19 0850 08/15/19 1236 08/15/19 1300 08/15/19 1523  BP:  (!) 144/97    Pulse:  (!) 110  98  Resp:  18    Temp:  99.1 F (  37.3 C)    TempSrc:  Oral    SpO2: 100% 100% 100% 100%  Weight:      Height:        Intake/Output Summary (Last 24 hours) at 08/15/2019 1935 Last data filed at 08/15/2019 1736 Gross per 24 hour  Intake 1161.33 ml  Output 1300 ml  Net -138.67 ml   Filed Weights   08/06/19 0120  Weight: 47 kg    Examination:  General exam:  Alert and comfortable. S./p tach on onxygen.  Respiratory system: air entry fair ,  coarse breath sounds at bases.  Cardiovascular system: s1s2, RRR, no JVD.  Gastrointestinal system: abd is soft, non tender non distended bowel sounds good, PEG in placed.  Central nervous system:  Alert and oriented. No n focal.  Extremities; no pedal edema or cyanosis  Skin: no rashes seen.  Psychiatry: mood appropriate.      Data Reviewed: I have personally reviewed following labs and imaging studies  CBC: Recent Labs  Lab 08/11/19 0543 08/12/19 0949 08/13/19 0547 08/14/19 0704 08/14/19 1418 08/15/19 0547  WBC 6.2 6.8 8.3 6.0  --  8.3  NEUTROABS 3.7 3.9 5.7 2.9  --  5.1  HGB 11.9* 12.5* 11.4* 10.4* 10.6* 10.6*  HCT 38.4* 40.6 36.1* 32.8* 33.1* 33.7*  MCV 95.8 96.9 93.3 95.3  --  97.1  PLT 238 272 258 265  --  301   Basic Metabolic Panel: Recent Labs  Lab 08/09/19 0834 08/10/19 0231 08/11/19 0543 08/12/19 0949 08/13/19 0547 08/14/19 1418  NA 137 140 138 139 137  --   K 3.6 3.6 3.1* 3.1* 3.3* 3.5  CL 100 103 102 99 99  --   CO2 28 29 30 30 28   --   GLUCOSE 140* 147* 106* 89 93  --   BUN 8 15 8 9 9   --   CREATININE 0.59* 0.75 0.55* 0.54* 0.49*  --   CALCIUM 8.4* 8.0* 8.0* 8.2* 8.3*  --   MG  --  2.0  --  2.0  --  1.6*   GFR: Estimated Creatinine Clearance: 66.9 mL/min (A) (by C-G formula based on SCr of 0.49 mg/dL (L)). Liver Function Tests: No results for input(s): AST, ALT, ALKPHOS, BILITOT, PROT, ALBUMIN in the last 168 hours. No results for input(s): LIPASE, AMYLASE in the last 168 hours. No results for input(s): AMMONIA in the last 168 hours. Coagulation Profile: Recent Labs  Lab 08/12/19 0949  INR 0.9   Cardiac Enzymes: No results for input(s): CKTOTAL, CKMB, CKMBINDEX, TROPONINI in the last 168 hours. BNP (last 3 results) No results for input(s): PROBNP in the last 8760 hours. HbA1C: No results for input(s): HGBA1C in the last 72 hours. CBG: Recent Labs  Lab 08/15/19 0012 08/15/19 0551 08/15/19 0749 08/15/19 1130 08/15/19 1552  GLUCAP  141* 118* 109* 131* 175*   Lipid Profile: No results for input(s): CHOL, HDL, LDLCALC, TRIG, CHOLHDL, LDLDIRECT in the last 72 hours. Thyroid Function Tests: No results for input(s): TSH, T4TOTAL, FREET4, T3FREE, THYROIDAB in the last 72 hours. Anemia Panel: No results for input(s): VITAMINB12, FOLATE, FERRITIN, TIBC, IRON, RETICCTPCT in the last 72 hours. Sepsis Labs: No results for input(s): PROCALCITON, LATICACIDVEN in the last 168 hours.  Recent Results (from the past 240 hour(s))  Respiratory Panel by PCR     Status: None   Collection Time: 08/06/19  1:48 AM   Specimen: Nasopharyngeal Swab; Respiratory  Result Value Ref Range Status   Adenovirus NOT DETECTED NOT DETECTED  Final   Coronavirus 229E NOT DETECTED NOT DETECTED Final    Comment: (NOTE) The Coronavirus on the Respiratory Panel, DOES NOT test for the novel  Coronavirus (2019 nCoV)    Coronavirus HKU1 NOT DETECTED NOT DETECTED Final   Coronavirus NL63 NOT DETECTED NOT DETECTED Final   Coronavirus OC43 NOT DETECTED NOT DETECTED Final   Metapneumovirus NOT DETECTED NOT DETECTED Final   Rhinovirus / Enterovirus NOT DETECTED NOT DETECTED Final   Influenza A NOT DETECTED NOT DETECTED Final   Influenza B NOT DETECTED NOT DETECTED Final   Parainfluenza Virus 1 NOT DETECTED NOT DETECTED Final   Parainfluenza Virus 2 NOT DETECTED NOT DETECTED Final   Parainfluenza Virus 3 NOT DETECTED NOT DETECTED Final   Parainfluenza Virus 4 NOT DETECTED NOT DETECTED Final   Respiratory Syncytial Virus NOT DETECTED NOT DETECTED Final   Bordetella pertussis NOT DETECTED NOT DETECTED Final   Chlamydophila pneumoniae NOT DETECTED NOT DETECTED Final   Mycoplasma pneumoniae NOT DETECTED NOT DETECTED Final    Comment: Performed at [facility], [place] [address], [place], [place] [address]  Surgical PCR screen     Status: None   Collection Time: 08/07/19 10:15 PM   Specimen: Nasal Mucosa; Nasal Swab  Result Value Ref Range Status   MRSA,  PCR NEGATIVE NEGATIVE Final   Staphylococcus aureus NEGATIVE NEGATIVE Final    Comment: (NOTE) The Xpert SA Assay (FDA approved for NASAL specimens in patients 25 years of age and older), is one component of a comprehensive surveillance program. It is not intended to diagnose infection nor to guide or monitor treatment. Performed at [facility], [place] [address], [place], [place] [address]   SARS CORONAVIRUS 2 Nasal Swab Aptima Multi Swab     Status: None   Collection Time: 08/11/19  5:52 PM   Specimen: Aptima Multi Swab; Nasal Swab  Result Value Ref Range Status   SARS Coronavirus 2 NEGATIVE NEGATIVE Final    Comment: (NOTE) SARS-CoV-2 target nucleic acids are NOT DETECTED. The SARS-CoV-2 RNA is generally detectable in upper and lower respiratory specimens during the acute phase of infection. Negative results do not preclude SARS-CoV-2 infection, do not rule out co-infections with other pathogens, and should not be used as the sole basis for treatment or other patient management decisions. Negative results must be combined with clinical observations, patient history, and epidemiological information. The expected result is Negative. Fact Sheet for Patients: SugarRoll.be Fact Sheet for Healthcare Providers: https://www.woods-mathews.com/ This test is not yet approved or cleared by the [place] FDA and  has been authorized for detection and/or diagnosis of SARS-CoV-2 by FDA under an Emergency Use Authorization (EUA). This EUA will remain  in effect (meaning this test can be used) for the duration of the COVID-19 declaration under Section 56 4(b)(1) of the Act, 21 U.S.C. section 360bbb-3(b)(1), unless the authorization is terminated or revoked sooner. Performed at [facility], [place] [address], [place], [place] [address]          Radiology Studies: No results found.      Scheduled Meds: . amLODipine  10 mg Oral  Daily  . dextromethorphan-guaiFENesin  1 tablet Oral BID  . enoxaparin (LOVENOX) injection  30 mg Subcutaneous Q24H  . feeding supplement (ENSURE ENLIVE)  237 mL Oral BID BM  . insulin aspart  0-9 Units Subcutaneous TID WC  . multivitamin with minerals  1 tablet Oral Daily  . neomycin-bacitracin-polymyxin  1 application Topical Daily  . nicotine  21 mg Transdermal Daily  . polyethylene glycol  17 g Oral Daily   Continuous Infusions: . feeding supplement (OSMOLITE 1.5 CAL) 50 mL/hr at 08/15/19 1916  . magnesium sulfate bolus IVPB       LOS: 10 days    Time spent: 32 minutes    Hosie Poisson, MD [facility] Pager [phone]   If 7PM-7AM, please contact night-coverage [email or web address] Password [facility] 08/15/2019, 7:35 PM

## 2019-08-15 NOTE — TOC Progression Note (Signed)
Transition of Care [facility]) - Progression Note    Patient Details  Name: Perry Waller MRN: [id number] Date of Birth: 08/25/1961  Transition of Care [facility]) CM/SW [place],  Phone Number: [phone] 08/15/2019, 3:43 PM  Clinical Narrative:   Seeking home health services for pt, difficulty thus far due to medicaid coverage. Needed primarily for new tracheostomy (Pt reportedly self-suctioning trach), however also seeking PT/OT, speech, aide, social work.   Pt with new feeding tube as well, ordered tube feed supplies ([employer]) and per care team, family learning to administer tube feeds.  Tracheostomy supplies order for to be completed by MD in progress for ordering through [employer].  Will continue working toward disposition home with home health and DME.           Expected Discharge Plan and Services           Expected Discharge Date: 08/16/19                                     Social Determinants of Health (SDOH) Interventions    Readmission Risk Interventions No flowsheet data found.

## 2019-08-15 NOTE — Progress Notes (Signed)
  Speech Language Pathology Treatment: Dysphagia  Patient Details Name: Perry Waller MRN: [id number] DOB: October 29, 1961 Today's Date: 08/15/2019 Time: 5681-2751 SLP Time Calculation (min) (ACUTE ONLY): 26 min  Assessment / Plan / Recommendation Clinical Impression  Reviewed Mendholson swallow exercise and effortful swallow.  Pt benefited from moderate verbal/tactile cues to conduct adequately; SLP demonstrated exercises with pt palpating SLP's neck to improve comprehension/effort of exercise.  Pt then demonstrated each exercise x3.  Advised pt to consume ice chips during exercise for moisture.  Also encouraged ice chips consumption to help with secretion visocity. Instructed pt to conduct 5 effortful swallows and 3 Mendohlson swallows per set - 3 times a day; will provide additional exercise following date.   HPI HPI: Mr. Antony Sian, 58y/m, presented to ER with shortness of breath and neck swelling. PMH significant for COPD, tobacco abuse and arthritis. 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 He has lost more than 40 pounds in the last several months. Tumor is stable with no obvious immediate airway distress. Trach placed 8/10 at 8 AM, Shiley #6 cuffed - trach.  Evaluation for readiness for [name] took place on 8/16 and today pt seen for follow up as he didn't initially tolerate valve well.Pt seen to provide swallow exercises to decrease tissue fibrosis/improve motility given his XRT and trach.  Pt agreeable to exercises today.      SLP Plan  Continue with current plan of care       Recommendations  Diet recommendations: Dysphagia 3 (mechanical soft);Nectar-thick liquid(ice chips) Liquids provided via: Cup;Straw Medication Administration: Whole meds with puree Supervision: Staff to assist with self feeding;Intermittent supervision to cue for compensatory strategies Compensations: Slow rate;Small sips/bites;Minimize environmental  distractions Postural Changes and/or Swallow Maneuvers: Seated upright 90 degrees;Upright 30-60 min after meal      Patient may use Passy-Muir Speech Valve: (pt educated and demonstrated removal of pmsv x6 thus advise he use it as tolerated) MD: Please consider changing trach tube to : Smaller size;Cuffless         Oral Care Recommendations: Oral care BID Follow up Recommendations: Home health SLP SLP Visit Diagnosis: Aphonia (R49.1) Plan: Continue with current plan of care       GO                Macario Golds 08/15/2019, 6:26 PM  Luanna Salk, [facility] SLP [place] Pager [phone] Office [phone]

## 2019-08-15 NOTE — Progress Notes (Signed)
Speech Language Pathology Treatment: Dysphagia;Passy Muir Speaking valve  Patient Details Name: Perry Waller MRN: [id number] DOB: [birth year] Today's Date: 08/15/2019 Time: 9702-6378 SLP Time Calculation (min) (ACUTE ONLY): 20 min  Assessment / Plan / Recommendation Clinical Impression  Tertiary treatment session conducted to help facilitate pt's possible change to cuffless trach.  Pt appeared to in better spirits with use of his voice today! Advised given his laryngeal cancer, if ENT changes out trach, he may need to wait to use the [name] until the next date.  Provided pt with written instructions, indications and contraindications for use of valve.  He was able to teach back need to have cuff completely deflated if valve placed or he would "choke" - advised he would be unable to exhale.    Regarding his long term goals, provided him with a list of questions that some people with a critical illness find helpful to consider for their goals of care meetings.    AAC boards - basic communication boards provided to pt to use as needed.  Prior to [name] placement, he is using writing effectively to communicate however it is laborious.  As pt continues to use Valve his voice becomes more dysphonic, suspect due to secretions and/or fatigue.  Thus highly advised he continue to have alternative means of communication available.  Despite using his valve, he recognized his voice weakness and compensated by writing.  Advised pt would come by next date for education with family and to assure tolerating valve well.    It may be beneficial to consider a MBS prior to pt dc dependent on his goals.  His intake of thickened liquids appears poor - as many juice containers are stacked up in his room.  Recommend either MBS or initiate [employer] protocol. SLP will speak to MD in am regarding potential plan to assure pt stays hydrated as much as able *also has PEG to help.     HPI HPI: Perry Waller, 58y/m, presented to ER  with shortness of breath and neck swelling. PMH significant for COPD, tobacco abuse and arthritis. 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 He has lost more than 40 pounds in the last several months. Tumor is stable with no obvious immediate airway distress. Trach placed 8/10 at 8 AM, Shiley #6 cuffed - trach.  Evaluation for readiness for [name] took place on 8/16 and today pt seen for follow up as he didn't initially tolerate valve well.Pt seen to provide swallow exercises to decrease tissue fibrosis/improve motility given his XRT and trach.      SLP Plan  Continue with current plan of care       Recommendations  Diet recommendations: Dysphagia 3 (mechanical soft);Nectar-thick liquid(ice chips) Liquids provided via: Cup;Straw Medication Administration: Whole meds with puree Supervision: Staff to assist with self feeding;Intermittent supervision to cue for compensatory strategies Compensations: Slow rate;Small sips/bites;Minimize environmental distractions Postural Changes and/or Swallow Maneuvers: Seated upright 90 degrees;Upright 30-60 min after meal      Patient may use Passy-Muir Speech Valve: During all waking hours (remove during sleep)(pt educated and demonstrated removal of pmsv x6 thus advise he use it as tolerated) [name] Supervision: Intermittent MD: Please consider changing trach tube to : Smaller size;Cuffless         Oral Care Recommendations: Oral care BID Follow up Recommendations: Home health SLP SLP Visit Diagnosis: Aphonia (R49.1) Plan: Continue with current plan of care  [name], Perry Waller 08/15/2019, 6:32 PM Luanna Salk, [facility] SLP Acute Rehab Services Pager [phone] Office [phone]

## 2019-08-15 NOTE — Progress Notes (Signed)
7 days post trach sutures removed and trach ties changed

## 2019-08-15 NOTE — Progress Notes (Addendum)
  Speech Language Pathology Treatment: Waller Waller Speaking valve  Patient Details Name: Waller Waller MRN: [id number] DOB: May 08, 1961 Today's Date: 08/15/2019 Time: 7169-6789 SLP Time Calculation (min) (ACUTE ONLY): 30 min  Assessment / Plan / Recommendation Clinical Impression  Pt making excellent progress with the use of his [name] today!  RT was in room with pt upon SLP arrival and she replaced pt's inner cannula and provided new clean collar.  SLP finger occluded pt's trach and pt was able to obtain voicing!  SLP then placed his [name], all vitals stable with oxygen sats high 90s to 100 and HR 100.  Intermittent wet voice noted that with cued throat clear and swallow was cleared acutely.  Pt moving some secretions below his trach tube with cough while valve in place.  Initially breath stacking noted within 11 minutes c/b air mobilization externally upon removal of valve during inhalation. He then immediately coughed out a minimal amount of secretions from his trach.  SLP had pt remove valve when coughing began to assure safety.  Pt demonstrated removal using mirror x3 and by locating by "feeling" x4 with Mod I.  He reports comfort with removal of valve.  Left pt with [name] in place and instructed him to remove if he is short of breath, coughing excessively, going to sleep or get breathing treatment. SLP informed RN of care plan update.    HPI HPI: Mr. Waller Waller, 58y/m, presented to ER with shortness of breath and neck swelling. PMH significant for COPD, tobacco abuse and arthritis. 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 He has lost more than 40 pounds in the last several months. Tumor is stable with no obvious immediate airway distress. Trach placed 8/10 at 8 AM, Shiley #6 cuffed - trach.  Evaluation for readiness for [name] took place on 8/16 and today pt seen for follow up as he didn't initially tolerate valve well.      SLP Plan  Continue with current plan of care       Recommendations         Patient may use Passy-Muir Speech Valve: (pt educated and demonstrated removal of pmsv x6 thus advise he use it as tolerated) MD: Please consider changing trach tube to : Smaller size;Cuffless         Oral Care Recommendations: Oral care BID Follow up Recommendations: Home health SLP SLP Visit Diagnosis: Aphonia (R49.1) Plan: Continue with current plan of care       Waller Waller, Waller Waller 08/15/2019, 5:35 PM  Luanna Salk, [place] [facility] SLP [place] Pager [phone] Office [phone]

## 2019-08-16 DIAGNOSIS — J42 Unspecified chronic bronchitis: Secondary | ICD-10-CM

## 2019-08-16 LAB — BASIC METABOLIC PANEL
Anion gap: 7 (ref 5–15)
BUN: 14 mg/dL (ref 6–20)
CO2: 30 mmol/L (ref 22–32)
Calcium: 8.6 mg/dL — ABNORMAL LOW (ref 8.9–10.3)
Chloride: 99 mmol/L (ref 98–111)
Creatinine, Ser: 0.51 mg/dL — ABNORMAL LOW (ref 0.61–1.24)
GFR calc Af Amer: >60 mL/min (ref >60)
GFR calc non Af Amer: >60 mL/min (ref >60)
Glucose, Bld: 147 mg/dL — ABNORMAL HIGH (ref 70–99)
Potassium: 3.7 mmol/L (ref 3.5–5.1)
Sodium: 136 mmol/L (ref 135–145)

## 2019-08-16 LAB — GLUCOSE, CAPILLARY
Glucose-Capillary: 144 mg/dL — ABNORMAL HIGH (ref 70–99)
Glucose-Capillary: 138 mg/dL — ABNORMAL HIGH (ref 70–99)
Glucose-Capillary: 109 mg/dL — ABNORMAL HIGH (ref 70–99)
Glucose-Capillary: 130 mg/dL — ABNORMAL HIGH (ref 70–99)
Glucose-Capillary: 150 mg/dL — ABNORMAL HIGH (ref 70–99)

## 2019-08-16 MED ORDER — BENZONATATE 200 MG PO CAPS: 200.0000 mg | ORAL_CAPSULE | Freq: Three times a day (TID) | ORAL | 0 refills | Status: DC | PRN

## 2019-08-16 MED ORDER — OSMOLITE 1.5 CAL PO LIQD: 1000.0000 mL | ORAL | 0 refills | Status: DC

## 2019-08-16 NOTE — Progress Notes (Signed)
RT placed pt on RA. Case management trying to get pt d/c home. Pt doing well at this time. No distress noted.

## 2019-08-16 NOTE — Progress Notes (Signed)
PROGRESS NOTE    Perry Waller  KWI:[id number] DOB: [birth year] DOA: 08/05/2019 PCP: [name], FNP   Brief Narrative:  58 year old male with prior h/o COPD, tobacco abuse, arthritis presents with sob and neck swelling associated with weight loss . CT neck showed large laryngeal mass with air way compromise, admitted to the [facility] for further evaluation. ENT consulted, underwent tracheostomy and biopsy showing squamous cell carcinoma. Oncology consulted, recommendations were for laryngectomy (but patient refused,) and radiation therapy.  Radiation therapy starting next week. No plans for chemotherapy. SLP evaluation recommending dysphagia 3 diet with nectar thick liquids. In view of his trach, dysphagia and starting radiation treatment next week, poor nutrition,  IR consulted for PEG placement,.  He underwent PEG placement on 08/12/2019.   Tube feeds have been set up . ENT changed to small# 6 cuff less trach on 08/16/2019.  Palliative care consulted, recommendations are for outpatient follow up on discharge.  Patient is stable for discharge and awaiting for CM/ SW to set up home health agency to discharge.    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  Radiation treatments to start next week if patient is agreeable.  Oncology further suggested PEG placement as his radiation therapy is going to worsen his dysphagia.  PEG placement done on  08/12/2019 and tube feeds were started on 08/13/2019 as per nutrition recommendations. Pain control with tramadol for neck pain.   ENT Dr Constance Holster changed the  cuffed trach tube to smaller #6  cuffless prior to discharge.      Acute COPD exacerbation   Wheezing has resolved. Weaned him off steroids.   Continue with robitussin and mucinex.   Severe Protein calorie malnutrition:  - nutrition, SLP on board. IR consulted to see if he will benefit from PEG for nutrition, as his radiation therapy might worsen his dysphagia.  Underwent PEG placement on 08/12/2019 and tube feeds started on 8/15. Slowly increased the tube feeds to optimal rate.  SLP to continue with PSV for speech.     Hypokalemia:  Replaced.  Hypomagnesemia: replaced.    Hypertension:  Well controlled. Resume Norvasc 10 mg daily.   Tobacco abuse  On Nicotine patch.  Constipation: resume senna, colace and miralax. .   Mild anemia of chronic disease Hemoglobin stable between 10 to 12.  No signs of bleeding from the trach site.     DVT prophylaxis: Lovenox.  Code Status: FULL CODE.  Family Communication: NONE AT BEDSIDE.  Discussed with sister over the phone on 08/15/19/ Disposition Plan: discharge home once home health arrangements are set up.   Consultants:   ENT  IR  Oncology  Radiation oncology.   Palliative care consult   Procedures: Tracheostomy  By Dr Janace Hoard on 8/10, changed to cuffless on 08/16/19.    Antimicrobials: none.    Subjective: No new complaints today. He appears in good mood.    Objective: Vitals:   08/16/19 0823 08/16/19 1141 08/16/19 1358 08/16/19 1545  BP:   103/75   Pulse:   99   Resp:  16   Temp:   98.8 F (37.1 C)   TempSrc:   Oral   SpO2: 98% 93% 100% 98%  Weight:      Height:        Intake/Output Summary (Last 24 hours) at 08/16/2019 1651 Last data filed at 08/16/2019 3785 Gross per 24 hour  Intake 360 ml  Output 1200 ml  Net -840 ml   Filed Weights   08/06/19 0120  Weight: 47 kg    Examination:  General exam: cachetic looking gentleman, appears  Alert and comfortable, s/p trach on 28% oxygen.   Respiratory system: air entry fair bilateral, coarse breath sounds , no tachypnea.  Cardiovascular system: S1S2, RRR, no pedal edema,no JVD.  Gastrointestinal system:  Abdomen is soft, non tender non distended, good bowel sounds, s/p PEG in place  Central nervous system:   Alert and oriented , able to communicate with writing.  Extremities; No pedal edema or cyanosis or clubbing.  Skin: NO rashes seen.  Psychiatry: Mood Is appropriate.     Data Reviewed: I have personally reviewed following labs and imaging studies  CBC: Recent Labs  Lab 08/11/19 0543 08/12/19 0949 08/13/19 0547 08/14/19 0704 08/14/19 1418 08/15/19 0547  WBC 6.2 6.8 8.3 6.0  --  8.3  NEUTROABS 3.7 3.9 5.7 2.9  --  5.1  HGB 11.9* 12.5* 11.4* 10.4* 10.6* 10.6*  HCT 38.4* 40.6 36.1* 32.8* 33.1* 33.7*  MCV 95.8 96.9 93.3 95.3  --  97.1  PLT 238 272 258 265  --  885   Basic Metabolic Panel: Recent Labs  Lab 08/10/19 0231 08/11/19 0543 08/12/19 0949 08/13/19 0547 08/14/19 1418 08/16/19 0513  NA 140 138 139 137  --  136  K 3.6 3.1* 3.1* 3.3* 3.5 3.7  CL 103 102 99 99  --  99  CO2 29 30 30 28   --  30  GLUCOSE 147* 106* 89 93  --  147*  BUN 15 8 9 9   --  14  CREATININE 0.75 0.55* 0.54* 0.49*  --  0.51*  CALCIUM 8.0* 8.0* 8.2* 8.3*  --  8.6*  MG 2.0  --  2.0  --  1.6*  --    GFR: Estimated Creatinine Clearance: 66.9 mL/min (A) (by C-G formula based on SCr of 0.51 mg/dL (L)). Liver Function Tests: No results for input(s): AST, ALT, ALKPHOS, BILITOT, PROT, ALBUMIN in the last 168 hours. No results for input(s): LIPASE, AMYLASE in the last 168 hours. No results for input(s): AMMONIA in the last 168 hours. Coagulation Profile: Recent Labs  Lab 08/12/19 0949  INR 0.9   Cardiac Enzymes: No results for input(s): CKTOTAL, CKMB, CKMBINDEX, TROPONINI in the last 168 hours. BNP (last 3 results) No results for input(s): PROBNP in the last 8760 hours. HbA1C: No results for input(s): HGBA1C in the last 72  hours. CBG: Recent Labs  Lab 08/15/19 2314 08/16/19 0512 08/16/19 0742 08/16/19 1150 08/16/19 1629  GLUCAP 156* 144* 138* 109* 130*   Lipid Profile: No results for input(s): CHOL, HDL, LDLCALC, TRIG, CHOLHDL, LDLDIRECT in the last 72 hours. Thyroid Function Tests: No results for input(s): TSH, T4TOTAL, FREET4, T3FREE, THYROIDAB in the last 72 hours. Anemia Panel: No results for input(s): VITAMINB12, FOLATE, FERRITIN, TIBC, IRON, RETICCTPCT in the last 72 hours. Sepsis Labs: No results for input(s): PROCALCITON, LATICACIDVEN in the last 168 hours.  Recent Results (from the past 240 hour(s))  Surgical PCR screen     Status: None   Collection Time: 08/07/19  10:15 PM   Specimen: Nasal Mucosa; Nasal Swab  Result Value Ref Range Status   MRSA, PCR NEGATIVE NEGATIVE Final   Staphylococcus aureus NEGATIVE NEGATIVE Final    Comment: (NOTE) The Xpert SA Assay (FDA approved for NASAL specimens in patients 68 years of age and older), is one component of a comprehensive surveillance program. It is not intended to diagnose infection nor to guide or monitor treatment. Performed at [facility], [place] [address] [place], [place] [address]   SARS CORONAVIRUS 2 Nasal Swab Aptima Multi Swab     Status: None   Collection Time: 08/11/19  5:52 PM   Specimen: Aptima Multi Swab; Nasal Swab  Result Value Ref Range Status   SARS Coronavirus 2 NEGATIVE NEGATIVE Final    Comment: (NOTE) SARS-CoV-2 target nucleic acids are NOT DETECTED. The SARS-CoV-2 RNA is generally detectable in upper and lower respiratory specimens during the acute phase of infection. Negative results do not preclude SARS-CoV-2 infection, do not rule out co-infections with other pathogens, and should not be used as the sole basis for treatment or other patient management decisions. Negative results must be combined with clinical observations, patient history, and epidemiological information. The expected result is  Negative. Fact Sheet for Patients: SugarRoll.be Fact Sheet for Healthcare Providers: https://www.woods-mathews.com/ This test is not yet approved or cleared by the [place] FDA and  has been authorized for detection and/or diagnosis of SARS-CoV-2 by FDA under an Emergency Use Authorization (EUA). This EUA will remain  in effect (meaning this test can be used) for the duration of the COVID-19 declaration under Section 56 4(b)(1) of the Act, 21 U.S.C. section 360bbb-3(b)(1), unless the authorization is terminated or revoked sooner. Performed at [facility], [place] [address] [place], [place] [address]          Radiology Studies: No results found.      Scheduled Meds: . amLODipine  10 mg Oral Daily  . dextromethorphan-guaiFENesin  1 tablet Oral BID  . enoxaparin (LOVENOX) injection  30 mg Subcutaneous Q24H  . feeding supplement (ENSURE ENLIVE)  237 mL Oral BID BM  . insulin aspart  0-9 Units Subcutaneous TID WC  . multivitamin with minerals  1 tablet Oral Daily  . neomycin-bacitracin-polymyxin  1 application Topical Daily  . nicotine  21 mg Transdermal Daily  . polyethylene glycol  17 g Oral Daily   Continuous Infusions: . feeding supplement (OSMOLITE 1.5 CAL) 50 mL/hr at 08/16/19 0500     LOS: 11 days    Time spent: 30 minutes    Hosie Poisson, MD [facility] Pager [phone]   If 7PM-7AM, please contact night-coverage [email or web address] Password [facility] 08/16/2019, 4:51 PM

## 2019-08-16 NOTE — Progress Notes (Signed)
  Speech Language Pathology Treatment: Dysphagia  Patient Details Name: Perry Waller MRN: [id number] DOB: [birth year] Today's Date: 08/16/2019 Time: 5462-7035 SLP Time Calculation (min) (ACUTE ONLY): 30 min  Assessment / Plan / Recommendation Clinical Impression  Pt today slid down in bed upon SLP entrance to room.  SLP inquired regarding his exercises reviewed yesterday.  As SLP reviewed a large amount of information yesterday, decreased recall noted.  Reviewed 2 exercises with pt =  He conducted effortful swallow without cues.  Perry Waller was more difficult for him to perform thus SLP switched this exercise with lingual press to aid laryngeal elevation/closure.  Moderate cues fading to minimal with lingual press.  Advised pt continue these exercises and designated appropriate reps/frequencies per day.    Pt intake has been poor - he will consume Ensure however.  He reports food was cold when arrived today and displeasure with his food items.    SLP reviewed use of thickener with pt's drinks - to nectar thick- and drinks that do not require thickener *Perry Waller, buttermilk, tomato juice.  In addition, recommended Free Water Protocol and ice at any time - provided in writing which SLP reviewed with pt using teach back.  Encouraged pt to drink thin water between meals after oral care and reviewed importance or oral care.    Pt agreeable to SLP follow up to address his dysphagia and advised he monitor swallowing since he has had XRT.     HPI HPI: [name], 58y/m, presented to ER with shortness of breath and neck swelling. PMH significant for COPD, tobacco abuse and arthritis. 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 He has lost more than 40 pounds in the last several months. Tumor is stable with no obvious immediate airway distress. Trach placed 8/10 at 8 AM, Shiley #6 cuffed - trach.  Evaluation for readiness for [name] took place on  8/16 and today pt seen for follow up as he didn't initially tolerate valve well.Pt seen to provide swallow exercises to decrease tissue fibrosis/improve motility given his XRT and trach.  Pt's trach was changed to cuffless number six by ENT today.      SLP Plan  Continue with current plan of care       Recommendations  Liquids provided via: Cup;Straw Medication Administration: Whole meds with puree Supervision: Staff to assist with self feeding;Intermittent supervision to cue for compensatory strategies Compensations: Slow rate;Small sips/bites;Minimize environmental distractions Postural Changes and/or Swallow Maneuvers: Seated upright 90 degrees;Upright 30-60 min after meal      Patient may use Passy-Muir Speech Valve: (pt educated and demonstrated removal of pmsv x6 thus advise he use it as tolerated)         Oral Care Recommendations: Oral care BID(am and pm) Follow up Recommendations: Home health SLP SLP Visit Diagnosis: Dysphagia, oropharyngeal phase (R13.12) Plan: Continue with current plan of care       GO                Macario Golds 08/16/2019, 6:29 PM  Luanna Salk, [place] [facility] SLP [place] Pager [phone] Office [phone]

## 2019-08-16 NOTE — Progress Notes (Addendum)
  Speech Language Pathology Treatment: Perry Waller Speaking valve  Patient Details Name: Perry Waller MRN: [id number] DOB: May 13, 1961 Today's Date: 08/16/2019 Time: 3335-4562 SLP Time Calculation (min) (ACUTE ONLY): 29 min  Assessment / Plan / Recommendation Clinical Impression  Trach changed today by Dr Janace Hoard to [facility] number six cuffless.  Pt did not have valve in place upon SLP entrance to room. He was agreeable to have it placed however.  Copious secretions noted - ? If slightly worse - ? Due to his trach change. SLP provided frequent suctioning at sternal notch below trach and at trach hub.   Pt's vocal quality waxes/wanes - at times near aphonic despite valve being in place.  Cues to clear his throat, swallow and then speak again was intermittently helpful.   Pt needed minimal cues to recall his valve was in place and he was able to voice.    Pt with 2 episodes of significant coughing with valve in place, he needed moderate verbal cues to remove the valve.  Copious amounts of secretions were expelled from trach and minimal amount from oral cavity.  All vitals remained stable with valve in place but advised strictly he remove it if significantly coughing to prevent mucus plugging.    Using teach back, pt educated to recommendations but pt did not demonstrate removal of valve at end of session, stating his fingers were numb.  He did NOT want SLP to remove valve despite SLP concern for his safe removal of valve. Advised him to call for assist if needed.    Please continue SLP follow up with [facility] for [name] management.  Will follow up to educate family if present next date.     HPI HPI: [name], 58y/m, presented to ER with shortness of breath and neck swelling. PMH significant for COPD, tobacco abuse and arthritis. 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 He has lost more than 40 pounds in the last several months. Tumor is  stable with no obvious immediate airway distress. Trach placed 8/10 at 8 AM, Shiley #6 cuffed - trach.  Evaluation for readiness for [name] took place on 8/16 and today pt seen for follow up as he didn't initially tolerate valve well.Pt seen to provide swallow exercises to decrease tissue fibrosis/improve motility given his XRT and trach.  Pt's trach was changed to cuffless number six by ENT today.      SLP Plan  Continue with current plan of care       Recommendations  Liquids provided via: Cup;Straw Medication Administration: Whole meds with puree Supervision: Staff to assist with self feeding;Intermittent supervision to cue for compensatory strategies Compensations: Slow rate;Small sips/bites;Minimize environmental distractions Postural Changes and/or Swallow Maneuvers: Seated upright 90 degrees;Upright 30-60 min after meal      Patient may use Passy-Muir Speech Valve: (pt educated and demonstrated removal of pmsv x6 thus advise he use it as tolerated) [name] Supervision: Intermittent         Oral Care Recommendations: Oral care BID(am and pm) Follow up Recommendations: Home health SLP SLP Visit Diagnosis: Aphonia (R49.1) Plan: Continue with current plan of care       GO                Perry Waller 08/16/2019, 6:49 PM  Perry Waller, [place] [facility] SLP [place] Pager [phone] Office [phone]

## 2019-08-16 NOTE — Progress Notes (Signed)
Physical Therapy Treatment Patient Details Name: Perry Waller MRN: [id number] DOB: [birth year] Today's Date: 08/16/2019    History of Present Illness Abdulloh Ullom is a 58 y.o. male who presents to [facility] with dysphagia and squamous cell carcinoma. PMH consist of COPD, acute resp. acidosis, and OA.    PT Comments    Pt agreeable to mobility this session. Pt with ambulation distance of 240 ft with no AD, pt with occasional use of environment to steady self. Pt with desat to 84% on RA according to PT pulse ox, pt with accompanying dyspnea. Pt placed on 2LO2 via trach collar and recovered sats to 90%s, RN notified. PT to continue to follow acutely.  Of note: feed via G tube paused for PT session and restarted at cessation of session. Pt reported a feeling of fullness in belly upon restarting tube feed, feeding paused and RN notified. RN to address.    Follow Up Recommendations  Home health PT;Supervision - Intermittent(vs no f/u depending on progress)     Equipment Recommendations  None recommended by PT    Recommendations for Other Services       Precautions / Restrictions Precautions Precautions: Fall;Other (comment) Precaution Comments: trach/T-collar Restrictions Weight Bearing Restrictions: No    Mobility  Bed Mobility Overal bed mobility: Needs Assistance Bed Mobility: Supine to Sit     Supine to sit: Supervision     General bed mobility comments: supervision for safety, increased time and effort with use of bed rails.  Transfers Overall transfer level: Needs assistance Equipment used: Rolling walker (2 wheeled) Transfers: Sit to/from Stand Sit to Stand: Supervision         General transfer comment: supervision for safety, no unsteadiness initially upon standing.  Ambulation/Gait Ambulation/Gait assistance: Min guard Gait Distance (Feet): 240 Feet Assistive device: None Gait Pattern/deviations: Step-through pattern;Drifts right/left;Narrow base of support;Decreased  stride length Gait velocity: decr   General Gait Details: Min guard for safety, 1 instance of min assist for steadying when change of direction out of room. Pt with occasional reaching for environment, DOE 2/4 after 110 ft ambulation with portable pulse ox measuring 84% on RA; pt placed on 2L via trach collar for remainder of ambulation.   Stairs             Wheelchair Mobility    Modified Rankin (Stroke Patients Only)       Balance Overall balance assessment: Needs assistance Sitting-balance support: No upper extremity supported;Feet unsupported Sitting balance-Leahy Scale: Good     Standing balance support: During functional activity;No upper extremity supported Standing balance-Leahy Scale: Fair Standing balance comment: unable to tolerate mod challenges               High Level Balance Comments: mild gait instability with delayed reactions but able to self recover with UE assist            Cognition Arousal/Alertness: Awake/alert Behavior During Therapy: WFL for tasks assessed/performed Overall Cognitive Status: Within Functional Limits for tasks assessed                                        Exercises General Exercises - Lower Extremity Gluteal Sets: AROM;Both;10 reps;Standing(hip extension in standing) Hip ABduction/ADduction: AROM;Both;10 reps;Standing Hip Flexion/Marching: AROM;Both;10 reps;Standing    General Comments General comments (skin integrity, edema, etc.): passy muir valve on during PT session      Pertinent Vitals/Pain Pain Assessment: 0-10  Pain Score: 1  Pain Location: abdomen, when tube feed re-started after ambulation (paused - RN notified) Pain Descriptors / Indicators: Discomfort Pain Intervention(s): Limited activity within patient's tolerance;Monitored during session;Repositioned    Home Living                      Prior Function            PT Goals (current goals can now be found in the care  plan section) Acute Rehab PT Goals Patient Stated Goal: no goal stated PT Goal Formulation: With patient Time For Goal Achievement: 08/20/19 Potential to Achieve Goals: Good Progress towards PT goals: Progressing toward goals    Frequency    Min 3X/week      PT Plan Current plan remains appropriate    Co-evaluation              AM-PAC PT "6 Clicks" Mobility   Outcome Measure  Help needed turning from your back to your side while in a flat bed without using bedrails?: None Help needed moving from lying on your back to sitting on the side of a flat bed without using bedrails?: None Help needed moving to and from a bed to a chair (including a wheelchair)?: A Little Help needed standing up from a chair using your arms (e.g., wheelchair or bedside chair)?: A Little Help needed to walk in hospital room?: A Little Help needed climbing 3-5 steps with a railing? : A Little 6 Click Score: 20    End of Session Equipment Utilized During Treatment: Gait belt;Other (comment);Oxygen(O2/trach collar) Activity Tolerance: Patient tolerated treatment well Patient left: in bed;with call bell/phone within reach;with bed alarm set Nurse Communication: Mobility status PT Visit Diagnosis: Unsteadiness on feet (R26.81)     Time: 3888-7579 PT Time Calculation (min) (ACUTE ONLY): 27 min  Charges:  $Gait Training: 8-22 mins $Therapeutic Exercise: 8-22 mins                     Gaile Allmon Conception Chancy, PT Acute Rehabilitation Services Pager [phone]  Office [phone]    Zerina Hallinan D Taariq Leitz 08/16/2019, 1:47 PM

## 2019-08-16 NOTE — Progress Notes (Signed)
Patient ID: Perry Waller, male   DOB: September 22, 1961, 58 y.o.   MRN: [id number] Tracheostomy changed to a #6 uncuffed without difficulty.  Stoma is stable.  Follow-up as needed.

## 2019-08-16 NOTE — TOC Progression Note (Addendum)
Transition of Care [facility]) - Progression Note    Patient Details  Name: Perry Waller MRN: [id number] Date of Birth: 10/23/1961  Transition of Care [facility]) CM/SW [place], [place] Phone Number: [phone] 08/16/2019, 11:59 AM  Clinical Narrative:   Working toward pt [place] home with new trach and tube feeds. Tube feeds and supplies ordered with [employer], Trach/supplies order in progress however waiting change to uncuffed trach today per report. As of yet unable to secure home health agency due to Medicaid coverage- [facility] team supervisor aware and assisting.  14:57- Discussed possibility/appropriateness of hospice agency proving the home care (especially trach care), however pt does not qualify for hospice due to planned radiation treatments (7 wk starting this week). Has requested outpatient palliative care however this would not be extensive home care.          Expected Discharge Plan and Services           Expected Discharge Date: 08/07/19                                     Social Determinants of Health (SDOH) Interventions    Readmission Risk Interventions No flowsheet data found.

## 2019-08-17 ENCOUNTER — Ambulatory Visit
Admit: 2019-08-17 | Discharge: 2019-08-17 | Disposition: A | Discharge status: Home / Self Care | Payer: Medicaid Other | Attending: Radiation Oncology | Admitting: Radiation Oncology

## 2019-08-17 DIAGNOSIS — Z930 Tracheostomy status: Secondary | ICD-10-CM

## 2019-08-17 DIAGNOSIS — C4442 Squamous cell carcinoma of skin of scalp and neck: Secondary | ICD-10-CM

## 2019-08-17 LAB — GLUCOSE, CAPILLARY
Glucose-Capillary: 105 mg/dL — ABNORMAL HIGH (ref 70–99)
Glucose-Capillary: 123 mg/dL — ABNORMAL HIGH (ref 70–99)
Glucose-Capillary: 89 mg/dL (ref 70–99)
Glucose-Capillary: 96 mg/dL (ref 70–99)

## 2019-08-17 MED ORDER — OSMOLITE 1.5 CAL PO LIQD
1000.0000 mL | ORAL | Status: DC
  Administered 2019-08-17: 1000 mL
  Filled 2019-08-17: qty 1000

## 2019-08-17 NOTE — Progress Notes (Signed)
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 Pharyngeal -- Pharyngeal- Thin Teaspoon Delayed swallow initiation-vallecula;Reduced laryngeal elevation;Reduced airway/laryngeal closure;Reduced tongue base retraction;Penetration/Aspiration during swallow Pharyngeal Material enters airway, CONTACTS cords and then ejected out Pharyngeal- Thin Cup -- Pharyngeal -- Pharyngeal- Thin Straw -- Pharyngeal -- Pharyngeal- Puree -- Pharyngeal -- Pharyngeal- Mechanical Soft Delayed swallow initiation-vallecula Pharyngeal -- Pharyngeal- Regular -- Pharyngeal -- Pharyngeal- Multi-consistency -- Pharyngeal -- Pharyngeal- Pill -- Pharyngeal -- Pharyngeal Comment --  CHL IP CERVICAL ESOPHAGEAL PHASE 08/07/2019 Cervical Esophageal Phase WFL Pudding Teaspoon -- Pudding Cup -- Honey Teaspoon -- Honey Cup -- Nectar Teaspoon -- Nectar Cup -- Nectar Straw -- Thin Teaspoon -- Thin Cup -- Thin Straw  -- Puree -- Mechanical Soft -- Regular -- Multi-consistency -- Pill -- Cervical Esophageal Comment --  Charlynne Cousins Ward, MA, CCC-SLP 08/07/2019 1:20 PM               Time Spent in minutes  25   Iola Turri M.D on 08/17/2019 at 10:43 AM  Between 7am to 7pm - Pager - [phone]  After 7pm go to [email or web address] - password [facility]  [facility] -  Office  [phone]

## 2019-08-17 NOTE — Progress Notes (Signed)
Trach education provided to patient and family on suctioning, trach care, and when to call emergency response. Family participated and seemed to understand teaching.

## 2019-08-17 NOTE — Progress Notes (Signed)
Perry Waller to be D/C'd Home per MD order.  Discussed prescriptions and follow up appointments with the patient. Prescriptions given to patient, medication list explained in detail. Pt verbalized understanding.  Allergies as of 08/17/2019      Reactions   Fish Allergy Other (See Comments)   Breaks out      Medication List    STOP taking these medications   naproxen sodium 220 MG tablet Commonly known as: ALEVE   predniSONE 10 MG (21) Tbpk tablet Commonly known as: STERAPRED UNI-PAK 21 TAB     TAKE these medications   albuterol 108 (90 Base) MCG/ACT inhaler Commonly known as: VENTOLIN HFA Inhale 2 puffs into the lungs every 4 (four) hours as needed for wheezing or shortness of breath.   amLODipine 10 MG tablet Commonly known as: NORVASC Take 1 tablet (10 mg total) by mouth daily.   benzonatate 200 MG capsule Commonly known as: TESSALON Take 1 capsule (200 mg total) by mouth 3 (three) times daily as needed for cough.   dextromethorphan-guaiFENesin 30-600 MG 12hr tablet Commonly known as: MUCINEX DM Take 1 tablet by mouth 2 (two) times daily.   feeding supplement (ENSURE ENLIVE) Liqd Take 237 mLs by mouth 2 (two) times daily between meals.   feeding supplement (OSMOLITE 1.5 CAL) Liqd Place 1,000 mLs into feeding tube daily.   feeding supplement (OSMOLITE 1.5 CAL) Liqd Place 1,000 mLs into feeding tube continuous.   multivitamin with minerals Tabs tablet Take 1 tablet by mouth daily.   neomycin-bacitracin-polymyxin ointment Commonly known as: NEOSPORIN Apply 1 application topically daily.   nicotine 21 mg/24hr patch Commonly known as: NICODERM CQ - dosed in mg/24 hours Place 1 patch (21 mg total) onto the skin daily.   polyethylene glycol powder 17 GM/SCOOP powder Commonly known as: GLYCOLAX/MIRALAX Take 17 g by mouth daily.   Resource ThickenUp Clear Powd Use as needed.   senna-docusate 8.6-50 MG tablet Commonly known as: Senokot-S Take 1 tablet by mouth at  bedtime as needed for mild constipation.   Spiriva HandiHaler 18 MCG inhalation capsule Generic drug: tiotropium Place 1 capsule (18 mcg total) into inhaler and inhale daily.   Vitamin D (Ergocalciferol) 1.25 MG (50000 UT) Caps capsule Commonly known as: DRISDOL Take 1 capsule (50,000 Units total) by mouth every 7 (seven) days.            Durable Medical Equipment  (From admission, onward)         Start     Ordered   08/15/19 1434  For home use only DME Tube feeding  Once    Comments: Initiate Osmolite 1.5 @ 50 ml/hr x 16 hours (1800-1000) via G-tube 8/18: Advance to Osmolite 1.5 @ 65 ml/hr x 16 hours (1800-1000) via G-tube  Goal rate of 65 ml/hr x 16 hours provides 1560 kcal (82% of needs), 65g protein (68% of needs) and 792 ml H2O.  -Continue Ensure Enlive po BID, each supplement provides 350 kcal and 20 grams of protein -Continue Magic cup TID with meals, each supplement provides 290 kcal and 9 grams of protein -Continue MVI    08/15/19 1433   08/15/19 1403  For home use only DME Suction  Once    Question:  Suction  Answer:  Trach   08/15/19 1402   08/15/19 1340  For home use only DME Tube feeding pump  Once    Question:  Length of Need  Answer:  Lifetime   08/15/19 1340   08/14/19 1400  For home  use only DME oxygen  Once    Question Answer Comment  Length of Need Lifetime   Mode or (Route) Nasal cannula   Liters per Minute 4   Frequency Continuous (stationary and portable oxygen unit needed)   Oxygen conserving device Yes   Oxygen delivery system Gas      08/14/19 1359          Vitals:   08/17/19 0737 08/17/19 1107  BP:    Pulse: 90 97  Resp: 18 20  Temp:    SpO2: 95% 98%    Skin clean, dry and intact without evidence of skin break down, no evidence of skin tears noted. IV catheter discontinued intact. Site without signs and symptoms of complications. Dressing and pressure applied. Pt denies pain at this time. No complaints noted.  An After Visit  Summary was printed and given to the patient's family. PEG tube education provided to family and respiratory therapy provided Trach care education to patient's family.  Patient escorted via [name], and D/C home via private auto.  Perry Waller 08/17/2019 3:36 PM

## 2019-08-17 NOTE — TOC Progression Note (Addendum)
Transition of Care [facility]) - Progression Note    Patient Details  Name: Cross Jorge MRN: [id number] Date of Birth: 30-Aug-1961  Transition [facility] CM/SW [place], [place] Phone Number: [phone] 08/17/2019, 12:07 PM  Clinical Narrative:   Still unable to locate home health agency able to accept pt's care (largest barrier is Medicaid coverage), pt reports planning to return home regardless, family members at home planning to assist with trach and peg tube care. Pt requested palliative care outpatient - arranged with [facility].  PEG equipment and trach suctioning device to be delivered to pt's room today, other trach supplies being shipped to his home ([employer] supplying). Pt needs 3-5 days trach supplies sent with him at DC to prevent lapse in supplies while awaiting shipment.  CSW can assist with transportation home if needed  Expected Discharge Plan and Services           Expected Discharge Date: 08/17/19                      Social Determinants of Health (SDOH) Interventions    Readmission Risk Interventions No flowsheet data found.

## 2019-08-17 NOTE — Plan of Care (Signed)
  Problem: Clinical Measurements: Goal: Ability to maintain clinical measurements within normal limits will improve Outcome: Adequate for Discharge Goal: Will remain free from infection Outcome: Adequate for Discharge Goal: Diagnostic test results will improve Outcome: Adequate for Discharge Goal: Respiratory complications will improve Outcome: Adequate for Discharge Goal: Cardiovascular complication will be avoided Outcome: Adequate for Discharge   Problem: Skin Integrity: Goal: Risk for impaired skin integrity will decrease Outcome: Adequate for Discharge

## 2019-08-17 NOTE — Progress Notes (Signed)
Daily Progress Note   Patient Name: Perry Waller       Date: 08/17/2019 DOB: 04/14/1961  Age: 58 y.o. MRN#: [id number] Attending Physician: Louellen Molder, MD Primary Care Physician: [name], FNP Admit Date: 08/05/2019  Reason for Consultation/Follow-up: Establishing goals of care  Subjective: Sitting up in bed, eating breakfast, watching television. Complains of headache.   Length of Stay: 12  Current Medications: Scheduled Meds:  . amLODipine  10 mg Oral Daily  . dextromethorphan-guaiFENesin  1 tablet Oral BID  . enoxaparin (LOVENOX) injection  30 mg Subcutaneous Q24H  . feeding supplement (ENSURE ENLIVE)  237 mL Oral BID BM  . insulin aspart  0-9 Units Subcutaneous TID WC  . multivitamin with minerals  1 tablet Oral Daily  . neomycin-bacitracin-polymyxin  1 application Topical Daily  . nicotine  21 mg Transdermal Daily  . polyethylene glycol  17 g Oral Daily    Continuous Infusions: . feeding supplement (OSMOLITE 1.5 CAL) 50 mL/hr at 08/16/19 0500    PRN Meds: acetaminophen, albuterol, benzonatate, guaiFENesin, hydrALAZINE, morphine injection, ondansetron (ZOFRAN) IV, Resource ThickenUp Clear, senna-docusate, traMADol  Physical Exam Vitals signs and nursing note reviewed.  Constitutional:      General: He is not in acute distress.    Appearance: He is ill-appearing.     Comments: Frail, thin   Cardiovascular:     Rate and Rhythm: Normal rate.  Pulmonary:     Effort: Pulmonary effort is normal. No tachypnea, accessory muscle usage or respiratory distress.  Abdominal:     General: Abdomen is flat.     Comments: PEG  Neurological:     Mental Status: He is alert and oriented to person, place, and time.             Vital Signs: BP 106/81 (BP Location: Right Arm)    Pulse 90   Temp 99.1 F (37.3 C) (Oral)   Resp 18   Ht _0  (1.651 m)   Wt 51.8 kg   SpO2 95%   BMI 19.00 kg/m  SpO2: SpO2: 95 % O2 Device: O2 Device: Tracheostomy Collar O2 Flow Rate: O2 Flow Rate (L/min): 5 L/min  Intake/output summary:   Intake/Output Summary (Last 24 hours) at 08/17/2019 0901 Last data filed at 08/17/2019 0442 Gross per 24 hour  Intake 120 ml  Output 2010 ml  Net -1890 ml   LBM: Last BM Date: 08/15/19 Baseline Weight: Weight: 47 kg Most recent weight: Weight: 51.8 kg       Palliative Assessment/Data:      Patient Active Problem List   Diagnosis Date Noted  . Palliative care encounter   . DNR (do not resuscitate)   . Goals of care, counseling/discussion   . Laryngeal cancer ([place]) 08/09/2019  . Protein-calorie malnutrition, severe 08/07/2019  . Neck mass 08/05/2019  . Hypokalemia 08/05/2019  . Tobacco abuse 08/05/2019  . Constipation 08/05/2019  . Acute respiratory failure with hypoxia ([place]) 06/30/2019  . COPD exacerbation ([place]) 06/30/2019    Palliative Care Assessment & Plan   HPI: 58 y.o. male  with past medical history of COPD, arthritis and polysubstance abuse who was admitted on 08/05/2019 with neck swelling and dysphagia.  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  He has received a tracheostomy and PEG this admission. Pursuing radiation therapy.   Assessment: I met today with Perry Waller. He is sitting up in bed and is able to speak to me through trach (able to understand him even without PMV). He complains of headache and his breakfast being cold. RN notified of headache and I called for chocolate ice cream/shake that is within his dietary restrictions.   I clarified GOC with him and he does wish to complete radiation but confirms NO plans for surgery or for chemotherapy. We did discuss possibility of hospice after radiation (total 7 weeks of radiation so doubt hospice will accept with this long duration). He  seems open to hospice but is really just focused on being discharged and out of hospital. I explain that we are trying to arrange for help for him to manage trach/PEG at home but this has been a challenge. He tells me that he can take care of this and he has 3 support people. I recommend formal training and check off for care for patient and 1 support person.   He has no further questions/concerns or complaints. No further pain or symptoms. Just ready to go home.   Recommendations/Plan:  Continue radiation.   Proceed with discharge home once teaching of trach/PEG care completed per nursing/RT. Discharge readiness per attending.   Code Status:  DNR - did not discuss today  Prognosis:   < 6 months likely with laryngeal cancer and no plans for surgery/chemo.   Discharge Planning:  Home with [place] was discussed with RN, CSW, Dr. Laverle Patter.   Thank you for allowing the Palliative Medicine Team to assist in the care of this patient.   Total Time 35 min Prolonged Time Billed  no       Greater than 50%  of this time was spent counseling and coordinating care related to the above assessment and plan.  Vinie Sill, NP Palliative Medicine Team Pager # [phone] (M-F 8a-5p) Team Phone # [phone] (Nights/Weekends)

## 2019-08-17 NOTE — Progress Notes (Signed)
[facility] Therapy Treatment Record Phone [phone]   Radiation Therapy was administered to Perry Waller on: 08/17/2019  1:57 PM and was treatment #1 out of a planned course of 35 treatments.   Radiation Treatment  1). Beam photons with 6-10 energy and Photons 6-10 MeV  2). Brachytherapy None  3). Stereotactic Radiosurgery None  4). Other Radiation None     Para Skeans, RT

## 2019-08-17 NOTE — Progress Notes (Signed)
Occupational Therapy Treatment Patient Details Name: Perry Waller MRN: [id number] DOB: May 20, 1961 Today's Date: 08/17/2019    History of present illness Perry Waller is a 58 y.o. male who presents to [facility] with dysphagia and squamous cell carcinoma. PMH consist of COPD, acute resp. acidosis, and OA.   OT comments  Pt plans to DC home with family with 24/7 A  Follow Up Recommendations  Home health OT    Equipment Recommendations  3 in 1 bedside commode    Recommendations for Other Services Speech consult    Precautions / Restrictions Precautions Precautions: Fall;Other (comment) Precaution Comments: trach/T-collar Restrictions Weight Bearing Restrictions: No       Mobility Bed Mobility Overal bed mobility: Needs Assistance Bed Mobility: Supine to Sit     Supine to sit: Supervision     General bed mobility comments: supervision for safety, increased time and effort with use of bed rails.  Transfers Overall transfer level: Needs assistance Equipment used: None Transfers: Sit to/from Stand Sit to Stand: Supervision         General transfer comment: supervision for safety, no unsteadiness initially upon standing.    Balance Overall balance assessment: Needs assistance Sitting-balance support: No upper extremity supported;Feet unsupported Sitting balance-Leahy Scale: Good     Standing balance support: During functional activity;No upper extremity supported Standing balance-Leahy Scale: Fair Standing balance comment: unable to tolerate mod challenges               High Level Balance Comments: mild gait instability with delayed reactions but able to self recover with UE assist           ADL either performed or assessed with clinical judgement   ADL Overall ADL's : Needs assistance/impaired     Grooming: Wash/dry hands;Wash/dry face;Standing;Supervision/safety                   Toilet Transfer: Min guard;BSC;Stand-pivot;Cueing for Best boy and Hygiene: Minimal assistance;Sit to/from stand;Cueing for compensatory techniques;Cueing for sequencing;Cueing for safety       Functional mobility during ADLs: Min guard General ADL Comments: pt states he plans to go home with family to A him with ADL activity as needed               Cognition Arousal/Alertness: Awake/alert Behavior During Therapy: WFL for tasks assessed/performed Overall Cognitive Status: Within Functional Limits for tasks assessed                                                     Pertinent Vitals/ Pain       Pain Assessment: No/denies pain     Prior Functioning/Environment              Frequency  Min 1X/week        Progress Toward Goals  OT Goals(current goals can now be found in the care plan section)     Acute Rehab OT Goals Patient Stated Goal: no goal stated OT Goal Formulation: With patient Time For Goal Achievement: 08/27/19 Potential to Achieve Goals: Good  Plan Discharge plan remains appropriate;Frequency remains appropriate       AM-PAC OT "6 Clicks" Daily Activity     Outcome Measure   Help from another person eating meals?: None Help from another person taking care of personal grooming?: None Help from another  person toileting, which includes using toliet, bedpan, or urinal?: A Little Help from another person bathing (including washing, rinsing, drying)?: A Little Help from another person to put on and taking off regular upper body clothing?: None Help from another person to put on and taking off regular lower body clothing?: A Little 6 Click Score: 21    End of Session Equipment Utilized During Treatment: Gait belt  OT Visit Diagnosis: Muscle weakness (generalized) (M62.81)   Activity Tolerance Patient tolerated treatment well   Patient Left with call bell/phone within reach;in bed(sitting EOB)   Nurse Communication Mobility status        Time:  8934-0684 OT Time Calculation (min): 12 min  Charges: OT General Charges $OT Visit: 1 Visit OT Treatments $Self Care/Home Management : 8-22 mins  Kari Baars, OT Acute Rehabilitation Services [phone] Office- [phone]      Nevaeh Casillas, Edwena Felty D 08/17/2019, 3:41 PM

## 2019-08-18 ENCOUNTER — Ambulatory Visit
Admission: RE | Admit: 2019-08-18 | Discharge: 2019-08-18 | Disposition: A | Discharge status: Home / Self Care | Payer: Medicaid Other | Source: Ambulatory Visit | Attending: Radiation Oncology | Admitting: Radiation Oncology

## 2019-08-18 ENCOUNTER — Other Ambulatory Visit: Payer: Self-pay

## 2019-08-18 DIAGNOSIS — C321 Malignant neoplasm of supraglottis: Secondary | ICD-10-CM | POA: Diagnosis not present

## 2019-08-18 DIAGNOSIS — Z510 Encounter for antineoplastic radiation therapy: Secondary | ICD-10-CM | POA: Diagnosis present

## 2019-08-19 ENCOUNTER — Encounter: Payer: Self-pay | Admitting: *Deleted

## 2019-08-19 ENCOUNTER — Other Ambulatory Visit: Payer: Self-pay

## 2019-08-19 ENCOUNTER — Ambulatory Visit
Admission: RE | Admit: 2019-08-19 | Discharge: 2019-08-19 | Disposition: A | Discharge status: Home / Self Care | Payer: Medicaid Other | Source: Ambulatory Visit | Attending: Radiation Oncology | Admitting: Radiation Oncology

## 2019-08-19 ENCOUNTER — Telehealth: Payer: Self-pay | Admitting: *Deleted

## 2019-08-19 DIAGNOSIS — Z510 Encounter for antineoplastic radiation therapy: Secondary | ICD-10-CM | POA: Diagnosis not present

## 2019-08-20 NOTE — Telephone Encounter (Signed)
Oncology Nurse Navigator Documentation  Called pt to check on his well-being since DC home on Wed, to coordinate attendance at 8/25 [place].  He was not available, spoke with caregiver/sister [name] with whom he lives. Re his well-being, she reported:  Feeding pump delivered (by [employer]?) with initial set up and instructions.  She was informed Mr. Heather not eligible for [facility] nursing.  She voiced frustration this was not established prior to DC.  I acknowledged her frustration, noted LCSW Gwinda Maine will be contacting her to address situation.  Lurline Idol mgt going well.  She is comfortable providing care, her dtr has experience with trachs and is providing assistance.  He has been self-sufficient with ADLs (except for trach care, PEG).  Staying active in the home (plays Juel Burrow and dominoes with family), went on ride with brother yesterday. Re next Tuesdays appts:  Provided 10:45 SLP [place] appt with 10:30 arrival, explained arrival/registration procedures.  Explained afternoon RT rescheduled for 11:45 in conjunction with [place] SLP and Nutrition appts.  Confirmed transportation has been rescheduled. She asked questions re his prognosis with acknowledgement current tmt is not curative; "what happens after he's done with tmt?".  I explained importance of completing course of XRT as prescribed by Dr. Isidore Moos (7 weeks M-F XRT) to optimize tmt benefit.  I answered her question re survival as best as able, encouraged her to share concern with Dr. Isidore Moos. I encouraged her to call me with questions/concerns as her brother proceeds with tmt.  She agreed to do so.  [name], RN, BSN Head & Neck Oncology Nurse [place] at [place] [phone]

## 2019-08-20 NOTE — Progress Notes (Signed)
Oncology Nurse Navigator Documentation  Met with Mr. Kimmey on completion of XRT to check on his well-being. He had arrived in [facility].  Trach w/ PMV in place.  Speech relatively clear.  Audible/visible secretions absent.  He reported managing XRT without difficulty (3rd tmt today).2nd tmt.  He indicated glad to be home s/p Wed's DC from [facility].  He denied questions/conerns.  I provided him Epic appt calendar, explained adjustment to appts next Tuesday so he can meet SLP.  I encouraged him to call me as needed.  He voiced understanding. I escorted him to [facility] entrance for ride home.  [name], RN, BSN Head & Neck Oncology Nurse [place] at [place] [phone]

## 2019-08-22 ENCOUNTER — Other Ambulatory Visit: Payer: Self-pay

## 2019-08-22 ENCOUNTER — Ambulatory Visit
Admission: RE | Admit: 2019-08-22 | Discharge: 2019-08-22 | Disposition: A | Discharge status: Home / Self Care | Payer: Medicaid Other | Source: Ambulatory Visit | Attending: Radiation Oncology | Admitting: Radiation Oncology

## 2019-08-22 DIAGNOSIS — Z510 Encounter for antineoplastic radiation therapy: Secondary | ICD-10-CM | POA: Diagnosis not present

## 2019-08-22 DIAGNOSIS — C329 Malignant neoplasm of larynx, unspecified: Secondary | ICD-10-CM

## 2019-08-22 MED ORDER — RADIAPLEXRX EX GEL
Freq: Once | CUTANEOUS | Status: AC
  Administered 2019-08-22: 15:00:00 via TOPICAL

## 2019-08-22 NOTE — Progress Notes (Signed)
Pt here for patient teaching.  Pt given Radiation and You booklet, Managing Acute Radiation Side Effects for Head and Neck Cancer handout, skin care instructions and Radiaplex gel.  Reviewed areas of pertinence such as fatigue, hair loss, mouth changes, skin changes, throat changes and taste changes . Pt able to give teach back of to pat skin, use unscented/gentle soap and drink plenty of water,apply Sonafine bid, avoid applying anything to skin within 4 hours of treatment and to use an electric razor if they must shave. Pt verbalizes understanding of information given and will contact nursing with any questions or concerns.     Http://rtanswers.org/treatmentinformation/whattoexpect/index

## 2019-08-23 ENCOUNTER — Inpatient Hospital Stay: Payer: Medicaid Other | Attending: Radiation Oncology

## 2019-08-23 ENCOUNTER — Ambulatory Visit: Payer: Medicaid Other | Attending: Radiation Oncology

## 2019-08-23 ENCOUNTER — Encounter: Payer: Self-pay | Admitting: *Deleted

## 2019-08-23 ENCOUNTER — Other Ambulatory Visit: Payer: Self-pay

## 2019-08-23 ENCOUNTER — Institutional Professional Consult (permissible substitution): Payer: Medicaid Other | Admitting: Pulmonary Disease

## 2019-08-23 ENCOUNTER — Ambulatory Visit
Admission: RE | Admit: 2019-08-23 | Discharge: 2019-08-23 | Disposition: A | Discharge status: Home / Self Care | Payer: Medicaid Other | Source: Ambulatory Visit | Attending: Radiation Oncology | Admitting: Radiation Oncology

## 2019-08-23 DIAGNOSIS — Z510 Encounter for antineoplastic radiation therapy: Secondary | ICD-10-CM | POA: Diagnosis not present

## 2019-08-23 DIAGNOSIS — R1313 Dysphagia, pharyngeal phase: Secondary | ICD-10-CM | POA: Diagnosis present

## 2019-08-23 NOTE — Patient Instructions (Signed)
SWALLOWING EXERCISES Do daily until 6 months after your last day of radiation, then 2 times per week afterwards  1. Effortful Swallows - Press your tongue against the roof of your mouth for 3 seconds, then squeeze          the muscles in your neck while you swallow your saliva or a sip of water - Repeat 10-15 times, 2-3 times a day, and use whenever you eat or drink  2. Masako Swallow - swallow with your tongue sticking out - Stick tongue out past your teeth and gently bite tongue with your teeth - Swallow, while holding your tongue with your teeth - Repeat 10-15 times, 2-3 times a day *use a wet spoon if your mouth gets dry*  3. Mendelsohn Maneuver - "half swallow" exercise - Start to swallow, and keep your Adam's apple up by squeezing hard with the            muscles of the throat - Hold the squeeze for 5-7 seconds and then relax - Repeat 10-15 times, 2-3 times a day *use a wet spoon if your mouth gets dry*  4. Chin pushback - Open your mouth  - Place your fist UNDER your chin near your neck, and push back with your fist for 5 seconds - Repeat 10 times, 2-3 times a day       5. Super swallow  - take a breath and hold it  - bear down like pushing bowels  - swallow and cough immediately  - repeat 10 times, twice a day
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
## 2019-08-23 NOTE — Progress Notes (Signed)
Nutrition Assessment:  Patient with new feeding tube and head and neck cancer.    58 year old male with stage IV SCC of the supraglottis.  Past medical history of tobacco use, COPD, arthritis, trach and PEG during recent hospital admission. Notes from hospital admission reviewed.  Noted patient planning to proceed with radiation alone at this time.    Spoke with sister, Henriette via phone.  She is patient's caregiver.  Reports that they have only done tube feeding 1 day (last Thursday) for 12 hours at 40ml/hr since being discharged from hospital due to not knowing how to flush water through the tube.  Also sister reports that patient did not want to do the tube feeding over the weekend.  Sister reports that patient is eating orally. Reports that usually has 1/2 grits, 2 eggs, 3 strips of bacon  And 1/2 toast for breakfast.  Usually eats supper as well but lately after radiation has not really wanted to eat much.  Reports that he drinks 2 ensure (unsure which one).  Has been drinking about 8-16 oz of water daily.  Reports that someone from [facility] called her yesterday and told her now to give the water flush.  She is suppose to give 145ml (3 syringes) water before starting feeding, 3 syringes after stopping feeding and 6ml at bedtime.    Noted hospital prescription was osmolite 1.5 at 25ml/hr for 16 hours.  Will provide 1560 calories, 62 g protein and 738ml free water.   Reports that he had bowel movement this am and yesterday.  Denies nausea.    Medications: MVI, miralax, Vit D, senokot  Labs: (8/18) glucose 147  Anthropometrics:   Height: 71 inches Weight: 114 lb 3.2 oz on 8/19 (Hosp weight) UBW: Noted > 40 lb weight loss in the last last several months per sister BMI: 13   Estimated Energy Needs  Kcals: 1560-1800 Protein: 78-90 g Fluid: > 1.8 L  NUTRITION DIAGNOSIS: Inadequate oral intake related to cancer and cancer related treatment side effects as evidenced by BMI 13 and  25% weight loss in several months   INTERVENTION:  Discussed with sister that tube feeding can run along with patient eating orally, if patient will allow. Will provide additional calories and protein for patient.   Recommend giving 158ml TID (start feeding, stop feeding and at bedtime).   Reports that she will weigh patient at home and keep a food diary. Encouraged high calorie oral nutrition supplement shake (350 calories or more) Encouraged oral intake as tolerated with consistency recommendations per SLP.   If sister running tube feeding for 16 hours at 65 ml/hr will receive 1560 calories, 62 g protein and 792 ml free water plus additional 1020 ml with flush and oral intake     MONITORING, EVALUATION, GOAL: Patient will tolerate tube feeding and oral intake and maintain weight   NEXT VISIT: Tuesday, Sept 1 after radiation  Gabriella Guile B. Zenia Resides, [place], [place] Registered Dietitian [phone] (pager)

## 2019-08-24 ENCOUNTER — Ambulatory Visit
Admission: RE | Admit: 2019-08-24 | Discharge: 2019-08-24 | Disposition: A | Discharge status: Home / Self Care | Payer: Medicaid Other | Source: Ambulatory Visit | Attending: Radiation Oncology | Admitting: Radiation Oncology

## 2019-08-24 ENCOUNTER — Ambulatory Visit: Payer: Medicaid Other | Admitting: Family Medicine

## 2019-08-24 ENCOUNTER — Other Ambulatory Visit: Payer: Self-pay

## 2019-08-24 DIAGNOSIS — Z510 Encounter for antineoplastic radiation therapy: Secondary | ICD-10-CM | POA: Diagnosis not present

## 2019-08-25 ENCOUNTER — Ambulatory Visit
Admission: RE | Admit: 2019-08-25 | Discharge: 2019-08-25 | Disposition: A | Discharge status: Home / Self Care | Payer: Medicaid Other | Source: Ambulatory Visit | Attending: Radiation Oncology | Admitting: Radiation Oncology

## 2019-08-25 ENCOUNTER — Other Ambulatory Visit: Payer: Self-pay

## 2019-08-25 DIAGNOSIS — Z510 Encounter for antineoplastic radiation therapy: Secondary | ICD-10-CM | POA: Diagnosis not present

## 2019-08-26 ENCOUNTER — Other Ambulatory Visit: Payer: Self-pay

## 2019-08-26 ENCOUNTER — Ambulatory Visit
Admission: RE | Admit: 2019-08-26 | Discharge: 2019-08-26 | Disposition: A | Discharge status: Home / Self Care | Payer: Medicaid Other | Source: Ambulatory Visit | Attending: Radiation Oncology | Admitting: Radiation Oncology

## 2019-08-26 DIAGNOSIS — Z510 Encounter for antineoplastic radiation therapy: Secondary | ICD-10-CM | POA: Diagnosis not present

## 2019-08-27 NOTE — Progress Notes (Addendum)
Oncology Nurse Navigator Documentation   Met with Perry Waller upon his arrival for [facility]N [facility].  He arrived in [facility].  He was seen by SLP Garald Balding,  Spoke with him at end of [facility], addressed questions.  I encouraged him to call me with needs/concerns as he continues with tmts.  I escorted him to [facility] 4 for tmt.   [name], RN, BSN Head & Neck Oncology [facility] at [place] [phone]

## 2019-08-28 ENCOUNTER — Other Ambulatory Visit: Payer: Self-pay

## 2019-08-28 ENCOUNTER — Encounter (HOSPITAL_COMMUNITY): Payer: Self-pay | Admitting: Emergency Medicine

## 2019-08-28 ENCOUNTER — Emergency Department (HOSPITAL_COMMUNITY): Payer: Medicaid Other

## 2019-08-28 ENCOUNTER — Emergency Department (HOSPITAL_COMMUNITY)
Admission: EM | Admit: 2019-08-28 | Discharge: 2019-08-28 | Disposition: A | Discharge status: Home / Self Care | Payer: Medicaid Other | Attending: Emergency Medicine | Admitting: Emergency Medicine

## 2019-08-28 DIAGNOSIS — C329 Malignant neoplasm of larynx, unspecified: Secondary | ICD-10-CM | POA: Insufficient documentation

## 2019-08-28 DIAGNOSIS — J449 Chronic obstructive pulmonary disease, unspecified: Secondary | ICD-10-CM | POA: Diagnosis not present

## 2019-08-28 DIAGNOSIS — Z79899 Other long term (current) drug therapy: Secondary | ICD-10-CM | POA: Diagnosis not present

## 2019-08-28 DIAGNOSIS — J9500 Unspecified tracheostomy complication: Secondary | ICD-10-CM | POA: Diagnosis present

## 2019-08-28 DIAGNOSIS — F17210 Nicotine dependence, cigarettes, uncomplicated: Secondary | ICD-10-CM | POA: Insufficient documentation

## 2019-08-28 LAB — BASIC METABOLIC PANEL
Anion gap: 12 (ref 5–15)
BUN: 15 mg/dL (ref 6–20)
CO2: 26 mmol/L (ref 22–32)
Calcium: 8.9 mg/dL (ref 8.9–10.3)
Chloride: 99 mmol/L (ref 98–111)
Creatinine, Ser: 0.65 mg/dL (ref 0.61–1.24)
GFR calc Af Amer: >60 mL/min (ref >60)
GFR calc non Af Amer: >60 mL/min (ref >60)
Glucose, Bld: 107 mg/dL — ABNORMAL HIGH (ref 70–99)
Potassium: 3.7 mmol/L (ref 3.5–5.1)
Sodium: 137 mmol/L (ref 135–145)

## 2019-08-28 MED ORDER — HYDROCODONE-HOMATROPINE 5-1.5 MG/5ML PO SYRP: 5.0000 mL | ORAL_SOLUTION | Freq: Four times a day (QID) | ORAL | 0 refills | Status: DC | PRN

## 2019-08-28 NOTE — ED Notes (Signed)
Pt transported to xray 
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
## 2019-08-28 NOTE — ED Triage Notes (Signed)
Pt presents from home for bleeding around trach that started tonight. Pt currently on radiation for laryngeal cancer. Pt also reports difficulty when laying flat along with cough.

## 2019-08-28 NOTE — Discharge Instructions (Addendum)
Take Hycodan as needed for pain and cough.  Follow-up closely with your doctor for further management of your tracheostomy.

## 2019-08-29 ENCOUNTER — Ambulatory Visit
Admission: RE | Admit: 2019-08-29 | Discharge: 2019-08-29 | Disposition: A | Discharge status: Home / Self Care | Payer: Medicaid Other | Source: Ambulatory Visit | Attending: Radiation Oncology | Admitting: Radiation Oncology

## 2019-08-29 ENCOUNTER — Other Ambulatory Visit: Payer: Self-pay

## 2019-08-29 DIAGNOSIS — Z510 Encounter for antineoplastic radiation therapy: Secondary | ICD-10-CM | POA: Diagnosis not present

## 2019-08-30 ENCOUNTER — Inpatient Hospital Stay: Payer: Medicaid Other | Attending: Oncology

## 2019-08-30 ENCOUNTER — Ambulatory Visit
Admission: RE | Admit: 2019-08-30 | Discharge: 2019-08-30 | Disposition: A | Discharge status: Home / Self Care | Payer: Medicaid Other | Source: Ambulatory Visit | Attending: Radiation Oncology | Admitting: Radiation Oncology

## 2019-08-30 ENCOUNTER — Other Ambulatory Visit: Payer: Self-pay

## 2019-08-30 DIAGNOSIS — C321 Malignant neoplasm of supraglottis: Secondary | ICD-10-CM | POA: Diagnosis not present

## 2019-08-30 DIAGNOSIS — Z510 Encounter for antineoplastic radiation therapy: Secondary | ICD-10-CM | POA: Insufficient documentation

## 2019-08-30 NOTE — Progress Notes (Signed)
Nutrition  Patient scheduled for nutrition follow-up after radiation today.  Patient did not come to [employer].  RD called sister Henriette for nutrition follow-up.  No answer.  Left message for sister to return call.  Netha Dafoe B. Zenia Resides, [place], [place] Registered Dietitian [phone] (pager)

## 2019-08-31 ENCOUNTER — Other Ambulatory Visit: Payer: Self-pay | Admitting: Radiation Oncology

## 2019-08-31 ENCOUNTER — Ambulatory Visit
Admission: RE | Admit: 2019-08-31 | Discharge: 2019-08-31 | Disposition: A | Discharge status: Home / Self Care | Payer: Medicaid Other | Source: Ambulatory Visit | Attending: Radiation Oncology | Admitting: Radiation Oncology

## 2019-08-31 ENCOUNTER — Telehealth: Payer: Self-pay

## 2019-08-31 DIAGNOSIS — Z510 Encounter for antineoplastic radiation therapy: Secondary | ICD-10-CM | POA: Diagnosis not present

## 2019-08-31 DIAGNOSIS — C321 Malignant neoplasm of supraglottis: Secondary | ICD-10-CM

## 2019-08-31 MED ORDER — CVS SALINE WOUND WASH 0.9 % EX SOLN: CUTANEOUS | 5 refills | Status: DC

## 2019-08-31 MED ORDER — BENZONATATE 200 MG PO CAPS: 200.0000 mg | ORAL_CAPSULE | Freq: Three times a day (TID) | ORAL | 3 refills | Status: DC | PRN

## 2019-08-31 NOTE — Telephone Encounter (Signed)
Nutrition  Called sister, Henriette and left message on voicemail.  Call back number included.   Perry Waller. Perry Waller, [place], [place] Registered Dietitian [phone] (pager)

## 2019-09-01 ENCOUNTER — Other Ambulatory Visit: Payer: Self-pay

## 2019-09-01 ENCOUNTER — Ambulatory Visit
Admission: RE | Admit: 2019-09-01 | Discharge: 2019-09-01 | Disposition: A | Discharge status: Home / Self Care | Payer: Medicaid Other | Source: Ambulatory Visit | Attending: Radiation Oncology | Admitting: Radiation Oncology

## 2019-09-01 DIAGNOSIS — Z510 Encounter for antineoplastic radiation therapy: Secondary | ICD-10-CM | POA: Diagnosis not present

## 2019-09-02 ENCOUNTER — Ambulatory Visit
Admission: RE | Admit: 2019-09-02 | Discharge: 2019-09-02 | Disposition: A | Discharge status: Home / Self Care | Payer: Medicaid Other | Source: Ambulatory Visit | Attending: Radiation Oncology | Admitting: Radiation Oncology

## 2019-09-02 ENCOUNTER — Other Ambulatory Visit: Payer: Self-pay

## 2019-09-02 DIAGNOSIS — Z510 Encounter for antineoplastic radiation therapy: Secondary | ICD-10-CM | POA: Diagnosis not present

## 2019-09-06 ENCOUNTER — Other Ambulatory Visit: Payer: Self-pay | Admitting: Radiation Oncology

## 2019-09-06 ENCOUNTER — Inpatient Hospital Stay: Payer: Medicaid Other

## 2019-09-06 ENCOUNTER — Ambulatory Visit
Admission: RE | Admit: 2019-09-06 | Discharge: 2019-09-06 | Disposition: A | Discharge status: Home / Self Care | Payer: Medicaid Other | Source: Ambulatory Visit | Attending: Radiation Oncology | Admitting: Radiation Oncology

## 2019-09-06 ENCOUNTER — Other Ambulatory Visit: Payer: Self-pay

## 2019-09-06 DIAGNOSIS — Z510 Encounter for antineoplastic radiation therapy: Secondary | ICD-10-CM | POA: Diagnosis not present

## 2019-09-06 DIAGNOSIS — C321 Malignant neoplasm of supraglottis: Secondary | ICD-10-CM

## 2019-09-06 MED ORDER — LIDOCAINE VISCOUS HCL 2 % MT SOLN: OROMUCOSAL | 5 refills | Status: DC

## 2019-09-06 NOTE — Progress Notes (Signed)
Nutrition Follow-up:  Called sister, Henriette for nutrition follow-up.  No answer.  Left message with call back number.  Montasia Chisenhall B. Zenia Resides, [place], [place] Registered Dietitian [phone] (pager)

## 2019-09-06 NOTE — Progress Notes (Signed)
Nutrition Follow-up:  Patient with stage IV SCC of the supraglottis.  Patient receiving radiation.   Met with patient following radiation treatment and prior to seeing MD this afternoon in clinic.  Patient reports that he is taking 7 cartons of tube feeding on Monday, Wednesday and Friday only.  Asked at what rate tube feeding is running and patient says until it runs out.  Reports that he is not taking any more tube feeding because he is eating and does not see the need.  Reports that he ate bacon, 1 egg, grits this am for breakfast.  Ate oysters for lunch. Reports ate chicken thigh and wing last night for dinner and ate mashed potatoes.  Reports that he is drinking about 3 ensure per day.  Reports that he is drinking water, tea orally.  Flushing tube but usually just when he starts feeding tube.  Patient frustrated and asking for different pain medication that he can afford. Patient also frustrated that every one is talking to his sister.  He reports that she does not tell him everything.   Reports bowels are moving normally.    Medications: reviewed  Labs: no new labs  Anthropometrics:   Weight today 102 lb 2 oz, last week 98 lb.    Noted 114 lb 8/19 hospital weight.     Estimated Energy Needs  Kcals: 1560-1800 Protein: 78-90 g Fluid: > 1.8 L  NUTRITION DIAGNOSIS: Inadequate oral intake improved with weight gain   INTERVENTION:  Encouraged patient to continue to eat foods orally for weight gain.  Encouraged him to continue to use tube feeding and eat orally to keep weight increasing.   Provided case of ensure enlive to patient.   Encouraged patient to flush feeding tube with at least 112m water when starting feeding and 1255mwhen stopping pump to keep from clogging tube. Needs to continue to drink fluids orally as well to stay hydrated.  Only number listed in system is sister"s number.  RD asked for phone number to reach patient and reports he does not have a phone.  Reports if I  call sister he will be right beside her and can talk to me.    Contact information given to patient.       MONITORING, EVALUATION, GOAL: Patient will tolerate tube feeding and oral intake and maintain weight   NEXT VISIT: phone f/u Sept 16 (Wednesday) Discussed with patient  Sireen Halk B. AlZenia ResidesRDOdellLDAzureegistered Dietitian [phone])

## 2019-09-07 ENCOUNTER — Encounter (HOSPITAL_COMMUNITY): Payer: Self-pay | Admitting: *Deleted

## 2019-09-07 ENCOUNTER — Other Ambulatory Visit: Payer: Self-pay

## 2019-09-07 ENCOUNTER — Encounter (HOSPITAL_COMMUNITY): Payer: Self-pay

## 2019-09-07 ENCOUNTER — Ambulatory Visit
Admission: RE | Admit: 2019-09-07 | Discharge: 2019-09-07 | Disposition: A | Discharge status: Home / Self Care | Payer: Medicaid Other | Source: Ambulatory Visit | Attending: Radiation Oncology | Admitting: Radiation Oncology

## 2019-09-07 ENCOUNTER — Telehealth: Payer: Self-pay

## 2019-09-07 DIAGNOSIS — Z510 Encounter for antineoplastic radiation therapy: Secondary | ICD-10-CM | POA: Diagnosis not present

## 2019-09-07 NOTE — Telephone Encounter (Signed)
Nutrition  Received message from [facility], nephew of patient asking RD to call back to schedule follow-up appointment.    RD returned nephew's call at [phone].  Provided RD's open time slots next week along with other RD's open time slots.  Patient prefers Tuesday at [place] on 9/15 for phone visit.  Asked RD to call [phone] for appointment.    Zymere Patlan B. Zenia Resides, [place], [place] Registered Dietitian [phone] (pager)

## 2019-09-07 NOTE — Telephone Encounter (Signed)
Nutrition  Received voicemail this am that patient's sister [name] left on RD's voicemail on 9/8 at 5:10pm.  Blain Pais stated that she was calling me at the request of Lamark Schue, gave call back number.    RD called [name] back this am.  No answer.  Left message with call back number.  Athenia Rys B. Zenia Resides, [place], [place] Registered Dietitian [phone] (pager)

## 2019-09-08 ENCOUNTER — Ambulatory Visit
Admission: RE | Admit: 2019-09-08 | Discharge: 2019-09-08 | Disposition: A | Discharge status: Home / Self Care | Payer: Medicaid Other | Source: Ambulatory Visit | Attending: Radiation Oncology | Admitting: Radiation Oncology

## 2019-09-08 DIAGNOSIS — Z510 Encounter for antineoplastic radiation therapy: Secondary | ICD-10-CM | POA: Diagnosis not present

## 2019-09-09 ENCOUNTER — Encounter: Payer: Self-pay | Admitting: Family Medicine

## 2019-09-09 ENCOUNTER — Ambulatory Visit
Admission: RE | Admit: 2019-09-09 | Discharge: 2019-09-09 | Disposition: A | Discharge status: Home / Self Care | Payer: Medicaid Other | Source: Ambulatory Visit | Attending: Radiation Oncology | Admitting: Radiation Oncology

## 2019-09-09 ENCOUNTER — Other Ambulatory Visit: Payer: Self-pay

## 2019-09-09 ENCOUNTER — Ambulatory Visit (INDEPENDENT_AMBULATORY_CARE_PROVIDER_SITE_OTHER): Payer: Medicaid Other | Admitting: Family Medicine

## 2019-09-09 VITALS — BP 106/74 | HR 76 | Temp 98.0°F | Resp 18 | Ht 71.0 in | Wt 99.0 lb

## 2019-09-09 DIAGNOSIS — Z430 Encounter for attention to tracheostomy: Secondary | ICD-10-CM | POA: Diagnosis not present

## 2019-09-09 DIAGNOSIS — J449 Chronic obstructive pulmonary disease, unspecified: Secondary | ICD-10-CM | POA: Diagnosis not present

## 2019-09-09 DIAGNOSIS — Z510 Encounter for antineoplastic radiation therapy: Secondary | ICD-10-CM | POA: Diagnosis not present

## 2019-09-09 DIAGNOSIS — C329 Malignant neoplasm of larynx, unspecified: Secondary | ICD-10-CM | POA: Diagnosis not present

## 2019-09-09 DIAGNOSIS — H6691 Otitis media, unspecified, right ear: Secondary | ICD-10-CM | POA: Diagnosis not present

## 2019-09-09 MED ORDER — AMOXICILLIN 500 MG PO CAPS: 500.0000 mg | ORAL_CAPSULE | Freq: Two times a day (BID) | ORAL | 0 refills | Status: AC

## 2019-09-09 NOTE — Patient Instructions (Signed)
Otitis Media, Adult  Otitis media means that the middle ear is red and swollen (inflamed) and full of fluid. The condition usually goes away on its own. Follow these instructions at home:  Take over-the-counter and prescription medicines only as told by your doctor.  If you were prescribed an antibiotic medicine, take it as told by your doctor. Do not stop taking the antibiotic even if you start to feel better.  Keep all follow-up visits as told by your doctor. This is important. Contact a doctor if:  You have bleeding from your nose.  There is a lump on your neck.  You are not getting better in 5 days.  You feel worse instead of better. Get help right away if:  You have pain that is not helped with medicine.  You have swelling, redness, or pain around your ear.  You get a stiff neck.  You cannot move part of your face (paralyzed).  You notice that the bone behind your ear hurts when you touch it.  You get a very bad headache. Summary  Otitis media means that the middle ear is red, swollen, and full of fluid.  This condition usually goes away on its own. In some cases, treatment may be needed.  If you were prescribed an antibiotic medicine, take it as told by your doctor. This information is not intended to replace advice given to you by your health care provider. Make sure you discuss any questions you have with your health care provider. Document Released: 06/02/2008 Document Revised: 11/27/2017 Document Reviewed: 01/05/2017 Elsevier Patient Education  2020 Elsevier Inc.  

## 2019-09-09 NOTE — Progress Notes (Signed)
Patient [facility]   Progress Note: General Provider: [name], FNP  SUBJECTIVE:   Perry Waller is a 58 y.o. male who  has a past medical history of Acute respiratory acidosis (06/29/2019), Arthritis, and COPD (chronic obstructive pulmonary disease) ([place]).. Patient presents today for Follow-up, Ear Pain (right ear pain ), Oral Swelling (right side of throat ), and Numbness (hands and feet )  Patient presents with right sided ear and neck pain x 2 weeks. Since his last visit with this provider, he was diagnosed with laryngeal cancer. He was hospitalized and had a trach placed. He is now going to radiation 5 days a week. He is requesting more information about taking care of his trach.  He continues to lose weight due to lack of appetite.   Review of Systems  Constitutional: Positive for chills, fever (reported not measured. ), malaise/fatigue and weight loss.  HENT: Positive for ear pain.   Eyes: Negative.   Respiratory: Negative.   Cardiovascular: Negative.   Gastrointestinal: Negative.   Genitourinary: Negative.   Musculoskeletal: Negative.   Skin: Negative.   Neurological: Negative.   Psychiatric/Behavioral: Negative.      OBJECTIVE: BP 106/74 (BP Location: Left Arm, Patient Position: Sitting, Cuff Size: Normal)   Pulse 76   Temp 98 F (36.7 C) (Oral)   Resp 18   Ht 5\' 11"  (1.803 m)   Wt 99 lb (44.9 kg)   SpO2 98%   BMI 13.81 kg/m   Wt Readings from Last 3 Encounters:  09/09/19 99 lb (44.9 kg)  08/28/19 112 lb 7 oz (51 kg)  08/17/19 114 lb 3.2 oz (51.8 kg)     Physical Exam Vitals signs and nursing note reviewed.  Constitutional:      General: He is not in acute distress.    Appearance: Normal appearance.  HENT:     Head: Normocephalic and atraumatic.     Right Ear: Hearing and ear canal normal. Tenderness present. Tympanic membrane is erythematous.     Left Ear: Hearing and tympanic membrane normal.  Eyes:   Extraocular Movements: Extraocular movements intact.     Conjunctiva/sclera: Conjunctivae normal.     Pupils: Pupils are equal, round, and reactive to light.  Neck:     Trachea: Tracheostomy present.  Cardiovascular:     Rate and Rhythm: Normal rate and regular rhythm.     Heart sounds: Waller murmur.  Pulmonary:     Effort: Pulmonary effort is normal.     Breath sounds: Normal breath sounds.  Musculoskeletal: Normal range of motion.  Skin:    General: Skin is warm and dry.  Neurological:     Mental Status: He is alert and oriented to person, place, and time.  Psychiatric:        Mood and Affect: Mood normal.        Behavior: Behavior normal.        Thought Content: Thought content normal.        Judgment: Judgment normal.     ASSESSMENT/PLAN:   1. Right otitis media, unspecified otitis media type - amoxicillin (AMOXIL) 500 MG capsule; Take 1 capsule (500 mg total) by mouth 2 (two) times daily for 10 days.  Dispense: 20 capsule; Refill: 0  2. Laryngeal cancer ([place])  3. Chronic obstructive pulmonary disease, unspecified COPD type ([place]) Patient has d/c'd all medications on his own and is not interested in taking.   4. Tracheostomy care [facility]) Referral placed to trach clinic  for further management.  - Trach visit; Future    Return in about 3 months (around 12/09/2019) for COPD.    The patient was given clear instructions to go to ER or return to medical center if symptoms do not improve, worsen or new problems develop. The patient verbalized understanding and agreed with plan of care.   Ms. Perry Waller. Perry Canary, FNP-BC Patient [facility] [address] [place], [place] [address] [phone]

## 2019-09-12 ENCOUNTER — Ambulatory Visit
Admission: RE | Admit: 2019-09-12 | Discharge: 2019-09-12 | Disposition: A | Discharge status: Home / Self Care | Payer: Medicaid Other | Source: Ambulatory Visit | Attending: Radiation Oncology | Admitting: Radiation Oncology

## 2019-09-12 DIAGNOSIS — Z510 Encounter for antineoplastic radiation therapy: Secondary | ICD-10-CM | POA: Diagnosis not present

## 2019-09-13 ENCOUNTER — Ambulatory Visit
Admission: RE | Admit: 2019-09-13 | Discharge: 2019-09-13 | Disposition: A | Discharge status: Home / Self Care | Payer: Medicaid Other | Source: Ambulatory Visit | Attending: Radiation Oncology | Admitting: Radiation Oncology

## 2019-09-13 ENCOUNTER — Other Ambulatory Visit: Payer: Self-pay

## 2019-09-13 ENCOUNTER — Inpatient Hospital Stay: Payer: Medicaid Other

## 2019-09-13 DIAGNOSIS — Z510 Encounter for antineoplastic radiation therapy: Secondary | ICD-10-CM | POA: Diagnosis not present

## 2019-09-13 NOTE — Progress Notes (Signed)
Nutrition Follow-up:  Patient with stage IV SCC of the supraglottis. Patient receiving radiation.    Spoke with patient via phone this am as well as nephew Evlyn Courier (on speaker phone). Patient reports that he has been eating 3 meals per day and drinking 2 ensure daily.  Has been eating bacon and eggs for breakfast.  Eating chicken, potato salad, rice as well.  Reports that lidocaine and hydrocodone have helped with swallowing pain.  Reports that he is drinking water and sweet tea.    Patient reports that he is doing 5 cartons of osmolite 1.5 on Monday, Wednesday and Friday.  Nephew reports that he turns on tube feeding at 7pm and turns it off at 9am. Flushes tube with 180 (3 syringes of water before starting and when stopping tube feeding).  Also flushes tube with at least 1 syringe full of water in the am and pm on days when he is not using the tube.  Nephew reports that he is not using the tube more frequently due to wanting to eat more during the day.  Denies issues with nausea.  Reports regular bowel movement this am.     Medications: reviewed  Labs: no new  Anthropometrics:   Weight noted 99 lb on 9/11 at Internal Medicine clinic.  Nephew said patient's weight yesterday at radiation was 104 lb.     Estimated Energy Needs  Kcals: 1560-1800 Protein: 78-90 g Fluid: > 1.8 L  NUTRITION DIAGNOSIS: Inadequate oral intake    INTERVENTION:  Discussed with patient and nephew if weight drops would recommend giving tube feeding more frequently than 3 days per week to prevent weight from decreasing.   Reviewed with patient and nephew that current water flush is not meeting nutritional needs and patient will need to continue to drink fluids orally to stay hydrated.   Encouraged patient to continue to eat high calorie, high protein foods for adequate calories and protein.  Discussed ways to add calories to diet.  Contact information provided    MONITORING, EVALUATION, GOAL: Patient will  consume adequate calories via tube feeding and oral intake to prevent weight loss   NEXT VISIT: phone f/u Tuesday, Sept 22  Gabi Mcfate B. Zenia Resides, [place], [place] Registered Dietitian [phone] (pager)

## 2019-09-14 ENCOUNTER — Ambulatory Visit
Admission: RE | Admit: 2019-09-14 | Discharge: 2019-09-14 | Disposition: A | Discharge status: Home / Self Care | Payer: Medicaid Other | Source: Ambulatory Visit | Attending: Radiation Oncology | Admitting: Radiation Oncology

## 2019-09-14 DIAGNOSIS — Z510 Encounter for antineoplastic radiation therapy: Secondary | ICD-10-CM | POA: Diagnosis not present

## 2019-09-15 ENCOUNTER — Ambulatory Visit
Admission: RE | Admit: 2019-09-15 | Discharge: 2019-09-15 | Disposition: A | Discharge status: Home / Self Care | Payer: Medicaid Other | Source: Ambulatory Visit | Attending: Radiation Oncology | Admitting: Radiation Oncology

## 2019-09-15 DIAGNOSIS — Z510 Encounter for antineoplastic radiation therapy: Secondary | ICD-10-CM | POA: Diagnosis not present

## 2019-09-15 NOTE — Telephone Encounter (Signed)
Encounter created in error

## 2019-09-16 ENCOUNTER — Other Ambulatory Visit: Payer: Self-pay

## 2019-09-16 ENCOUNTER — Ambulatory Visit
Admission: RE | Admit: 2019-09-16 | Discharge: 2019-09-16 | Disposition: A | Discharge status: Home / Self Care | Payer: Medicaid Other | Source: Ambulatory Visit | Attending: Radiation Oncology | Admitting: Radiation Oncology

## 2019-09-16 DIAGNOSIS — Z510 Encounter for antineoplastic radiation therapy: Secondary | ICD-10-CM | POA: Diagnosis not present

## 2019-09-19 ENCOUNTER — Other Ambulatory Visit: Payer: Self-pay

## 2019-09-19 ENCOUNTER — Ambulatory Visit
Admission: RE | Admit: 2019-09-19 | Discharge: 2019-09-19 | Disposition: A | Discharge status: Home / Self Care | Payer: Medicaid Other | Source: Ambulatory Visit | Attending: Radiation Oncology | Admitting: Radiation Oncology

## 2019-09-19 ENCOUNTER — Other Ambulatory Visit (HOSPITAL_COMMUNITY)
Admission: RE | Admit: 2019-09-19 | Discharge: 2019-09-19 | Disposition: A | Discharge status: Home / Self Care | Payer: Medicaid Other | Source: Ambulatory Visit | Attending: Family Medicine | Admitting: Family Medicine

## 2019-09-19 DIAGNOSIS — Z20828 Contact with and (suspected) exposure to other viral communicable diseases: Secondary | ICD-10-CM | POA: Insufficient documentation

## 2019-09-19 DIAGNOSIS — Z510 Encounter for antineoplastic radiation therapy: Secondary | ICD-10-CM | POA: Diagnosis not present

## 2019-09-19 DIAGNOSIS — Z01812 Encounter for preprocedural laboratory examination: Secondary | ICD-10-CM | POA: Diagnosis present

## 2019-09-20 ENCOUNTER — Inpatient Hospital Stay: Payer: Medicaid Other

## 2019-09-20 ENCOUNTER — Other Ambulatory Visit: Payer: Self-pay

## 2019-09-20 ENCOUNTER — Ambulatory Visit
Admission: RE | Admit: 2019-09-20 | Discharge: 2019-09-20 | Disposition: A | Discharge status: Home / Self Care | Payer: Medicaid Other | Source: Ambulatory Visit | Attending: Radiation Oncology | Admitting: Radiation Oncology

## 2019-09-20 ENCOUNTER — Encounter: Payer: Self-pay | Admitting: *Deleted

## 2019-09-20 DIAGNOSIS — C329 Malignant neoplasm of larynx, unspecified: Secondary | ICD-10-CM

## 2019-09-20 DIAGNOSIS — Z510 Encounter for antineoplastic radiation therapy: Secondary | ICD-10-CM | POA: Diagnosis not present

## 2019-09-20 MED ORDER — RADIAPLEXRX EX GEL
Freq: Once | CUTANEOUS | Status: AC
  Administered 2019-09-20: 16:00:00 via TOPICAL

## 2019-09-20 NOTE — Progress Notes (Signed)
Oncology Nurse Navigator Documentationf  Met with [name] s/p XRT to address questions/concerns. Provided:  Overview of dx, treatment plan, timeframe of post-XRT scan, timeframe for trach removal, addressed his concerns re dry skin/pealing in area of tmt.  He indicated information helpful, denied further questions.  Letter prepared by RN Anderson Malta Malmfelt that provided overview of dx, treatment, post-tmt scan timeframe.  Supplies - Sonafine, encouraged TID application; packets of abx ointment, encouraged application to pink areas on bilateral neck; provided box of drainage sponges for trach. I encouraged him to contact me with further questions/concerns.  He agreed to do so. I escorted him to [facility] lobby to await ride  [name], Therapist, sports, [place] [facility] [place] [phone]

## 2019-09-20 NOTE — Progress Notes (Signed)
Nutrition  Called nephew Evlyn Courier [phone]) mobile number at the request of the patient for nutrition follow-up.  No answer.  Mailbox full and unable to leave message at this time.   Velmer Broadfoot B. Zenia Resides, [place], [place] Registered Dietitian [phone] (pager)

## 2019-09-21 ENCOUNTER — Ambulatory Visit
Admission: RE | Admit: 2019-09-21 | Discharge: 2019-09-21 | Disposition: A | Discharge status: Home / Self Care | Payer: Medicaid Other | Source: Ambulatory Visit | Attending: Radiation Oncology | Admitting: Radiation Oncology

## 2019-09-21 ENCOUNTER — Other Ambulatory Visit: Payer: Self-pay

## 2019-09-21 DIAGNOSIS — Z510 Encounter for antineoplastic radiation therapy: Secondary | ICD-10-CM | POA: Diagnosis not present

## 2019-09-21 LAB — NOVEL CORONAVIRUS, NAA (HOSP ORDER, SEND-OUT TO REF LAB; TAT 18-24 HRS): SARS-CoV-2, NAA: NOT DETECTED

## 2019-09-22 ENCOUNTER — Other Ambulatory Visit: Payer: Self-pay

## 2019-09-22 ENCOUNTER — Ambulatory Visit
Admission: RE | Admit: 2019-09-22 | Discharge: 2019-09-22 | Disposition: A | Discharge status: Home / Self Care | Payer: Medicaid Other | Source: Ambulatory Visit | Attending: Radiation Oncology | Admitting: Radiation Oncology

## 2019-09-22 ENCOUNTER — Ambulatory Visit (HOSPITAL_COMMUNITY)
Admission: RE | Admit: 2019-09-22 | Discharge: 2019-09-22 | Disposition: A | Discharge status: Home / Self Care | Payer: Medicaid Other | Source: Ambulatory Visit | Attending: Acute Care | Admitting: Acute Care

## 2019-09-22 DIAGNOSIS — J449 Chronic obstructive pulmonary disease, unspecified: Secondary | ICD-10-CM | POA: Insufficient documentation

## 2019-09-22 DIAGNOSIS — R599 Enlarged lymph nodes, unspecified: Secondary | ICD-10-CM | POA: Insufficient documentation

## 2019-09-22 DIAGNOSIS — Z430 Encounter for attention to tracheostomy: Secondary | ICD-10-CM | POA: Diagnosis not present

## 2019-09-22 DIAGNOSIS — R1310 Dysphagia, unspecified: Secondary | ICD-10-CM | POA: Insufficient documentation

## 2019-09-22 DIAGNOSIS — M1990 Unspecified osteoarthritis, unspecified site: Secondary | ICD-10-CM | POA: Diagnosis not present

## 2019-09-22 DIAGNOSIS — Z510 Encounter for antineoplastic radiation therapy: Secondary | ICD-10-CM | POA: Diagnosis not present

## 2019-09-22 DIAGNOSIS — Z930 Tracheostomy status: Secondary | ICD-10-CM
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
## 2019-09-22 NOTE — Progress Notes (Signed)
    To whom it may concern,  This letter is to confirm that Ms Perry Waller accompanied [name] (her brother) to his tracheostomy clinic appointment today. This was necessary so that she could receive the needed training required to care for him at home.   Thank you for your assistance,   [name] ACNP-BC [phone]  [place]

## 2019-09-22 NOTE — Progress Notes (Signed)
Tracheostomy Procedure Note  Gaige Sebo [id number] [birth year]  Pre Procedure Tracheostomy Information  Trach Brand: Shiley Size: 6.0 Style: Uncuffed Secured by: Velcro Pt has a PMV  Procedure: Trach assessment and Trach cleanng  VSS BP 134/79  HR 57  RR 15  Pulse oximetry  100% on RA  Post Procedure Tracheostomy Information  Trach Brand: Shiley Size: 6.0 Style: Uncuffed Secured by: Velcro   Post Procedure Evaluation:  ETCO2 positive color change from yellow to purple : Yes.   Vital signs:blood pressure 139/85  pulse 63, respirations 18 and pulse oximetry 100% Patients current condition: stable Complications: No apparent complications Trach site exam: clean Wound care done: dry Patient did tolerate procedure well.   Education: Taught niece trach cleaning and cleaning of inner cannula  Prescription needs: Scripts sent to [facility]  VSS 174/78  100%  On RA RR 18  HR 59  Additional needs: Extra trachs given to patient 4 size 6  One each to take home  obturater given to patient also

## 2019-09-23 ENCOUNTER — Other Ambulatory Visit: Payer: Self-pay

## 2019-09-23 ENCOUNTER — Ambulatory Visit
Admission: RE | Admit: 2019-09-23 | Discharge: 2019-09-23 | Disposition: A | Discharge status: Home / Self Care | Payer: Medicaid Other | Source: Ambulatory Visit | Attending: Radiation Oncology | Admitting: Radiation Oncology

## 2019-09-23 DIAGNOSIS — Z510 Encounter for antineoplastic radiation therapy: Secondary | ICD-10-CM | POA: Diagnosis not present

## 2019-09-26 ENCOUNTER — Other Ambulatory Visit: Payer: Self-pay

## 2019-09-26 ENCOUNTER — Ambulatory Visit
Admission: RE | Admit: 2019-09-26 | Discharge: 2019-09-26 | Disposition: A | Discharge status: Home / Self Care | Payer: Medicaid Other | Source: Ambulatory Visit | Attending: Radiation Oncology | Admitting: Radiation Oncology

## 2019-09-26 DIAGNOSIS — Z510 Encounter for antineoplastic radiation therapy: Secondary | ICD-10-CM | POA: Diagnosis not present

## 2019-09-26 DIAGNOSIS — C329 Malignant neoplasm of larynx, unspecified: Secondary | ICD-10-CM

## 2019-09-26 MED ORDER — RADIAPLEXRX EX GEL
Freq: Once | CUTANEOUS | Status: AC
  Administered 2019-09-26: 15:00:00 via TOPICAL

## 2019-09-27 ENCOUNTER — Inpatient Hospital Stay: Payer: Medicaid Other

## 2019-09-27 ENCOUNTER — Other Ambulatory Visit: Payer: Self-pay

## 2019-09-27 ENCOUNTER — Ambulatory Visit
Admission: RE | Admit: 2019-09-27 | Discharge: 2019-09-27 | Disposition: A | Discharge status: Home / Self Care | Payer: Medicaid Other | Source: Ambulatory Visit | Attending: Radiation Oncology | Admitting: Radiation Oncology

## 2019-09-27 DIAGNOSIS — Z510 Encounter for antineoplastic radiation therapy: Secondary | ICD-10-CM | POA: Diagnosis not present

## 2019-09-27 NOTE — Progress Notes (Signed)
Nutrition Follow-up:  Patient with stage IV SCC of the supraglottis.  Patient receiving radiation.   Spoke with patient following radiation treatment today.  Patient agreeable to meeting with RD face to face.  Patient reports that he is still eating orally. Does report some throat soreness and increased secretions.  Reports that he ate bacon and grits this am and boiled chicken and noodles with butter beans and cornbread for lunch today.  Reports that he is almost out of ensure shakes.  Has been trying to drink 3 per day.    Reports that he is continuing to give tube feeding on Monday, Wednesday, Friday 5 cartons of osmolite 1.5 via continuous pump.  Starts at 7pm and turns it off at [place] with 133ml water before starting feeding and when stops feeding.  Patient denies nausea. Reports regular bowel movement 1-2 times per day, no diarrhea.  Does not want to give tube feeding more often because eating orally.      Medications: lidocaine, hydrocodone  Labs: no new labs  Anthropometrics:   Weight per radiation from nurse navigator 9/21 105 lb 9/14 100 lb 4 oz 9/8 102 lb 2 oz 8/31 98 lb 4 oz  Patient reports that he weighed 105 lb yesterday at radiation.    Estimated Energy Needs  Kcals: 1560-1800 Protein: 78-90 g Fluid: > 1.8 L  NUTRITION DIAGNOSIS: Inadequate oral intake stable   INTERVENTION:  Patient supplementing oral intake with osmolite 1.5 on Monday, Wednesday and Friday, 5 cartons per day.  This provides 1775 calories, 74.5 g or protein and 905 ml free water (formula alone).  Reviewed with patient again today if oral intake decreases and weight decreases would recommend increasing feeding to 4-5 days per week (same regimen).  Patient verbalized understanding.  Explained that current tube feeding free water is not enough to meet nutritional needs and will need to continue to drink fluids orally to stay hydrated.   Encouraged patient to continue oral intake and oral  nutrition supplement shakes.  Provided case of ensure enlive to patient today.   Patient has contact information.     MONITORING, EVALUATION, GOAL: Patient will consume adequate calories via tube feeding and oral intake to prevent weight loss.    NEXT VISIT: Wednesday, October 7th after radiation treatment.  Discussed with patient today.  Debbora Ang B. Zenia Resides, [place], [place] Registered Dietitian [phone] (pager)

## 2019-09-28 ENCOUNTER — Ambulatory Visit
Admission: RE | Admit: 2019-09-28 | Discharge: 2019-09-28 | Disposition: A | Discharge status: Home / Self Care | Payer: Medicaid Other | Source: Ambulatory Visit | Attending: Radiation Oncology | Admitting: Radiation Oncology

## 2019-09-28 ENCOUNTER — Other Ambulatory Visit: Payer: Self-pay

## 2019-09-28 ENCOUNTER — Ambulatory Visit: Payer: Medicaid Other | Attending: Radiation Oncology

## 2019-09-28 ENCOUNTER — Telehealth: Payer: Self-pay

## 2019-09-28 DIAGNOSIS — Z510 Encounter for antineoplastic radiation therapy: Secondary | ICD-10-CM | POA: Diagnosis not present

## 2019-09-28 NOTE — Telephone Encounter (Signed)
Can be added to 9/15 [place] am or pm.  I will give him a call.

## 2019-09-28 NOTE — Telephone Encounter (Signed)
SLP called pt at 0945, within the agreed upon timeframe for SLP to consult with pt re: HEP provided 08-23-19, and his current PO intake.  On 08-23-19 (eval date), SLP explained rationale for follow-up ST appointments and set up an appointment date (today) to review the above-mentioned things.  Pt did not answer his phone and a message was left, encouraging a return phone call to SLP today. Pt will be called by front office staff or [name] to set up another date/time for SLP to connect with pt.  Garald Balding, MS, CCC-SLP

## 2019-09-29 ENCOUNTER — Ambulatory Visit
Admission: RE | Admit: 2019-09-29 | Discharge: 2019-09-29 | Disposition: A | Discharge status: Home / Self Care | Payer: Medicaid Other | Source: Ambulatory Visit | Attending: Radiation Oncology | Admitting: Radiation Oncology

## 2019-09-29 ENCOUNTER — Other Ambulatory Visit: Payer: Self-pay

## 2019-09-29 DIAGNOSIS — Z510 Encounter for antineoplastic radiation therapy: Secondary | ICD-10-CM | POA: Diagnosis not present

## 2019-09-29 DIAGNOSIS — C321 Malignant neoplasm of supraglottis: Secondary | ICD-10-CM | POA: Insufficient documentation

## 2019-09-30 ENCOUNTER — Ambulatory Visit: Payer: Medicaid Other

## 2019-10-03 ENCOUNTER — Ambulatory Visit
Admission: RE | Admit: 2019-10-03 | Discharge: 2019-10-03 | Disposition: A | Discharge status: Home / Self Care | Payer: Medicaid Other | Source: Ambulatory Visit | Attending: Radiation Oncology | Admitting: Radiation Oncology

## 2019-10-03 ENCOUNTER — Other Ambulatory Visit: Payer: Self-pay

## 2019-10-03 DIAGNOSIS — Z510 Encounter for antineoplastic radiation therapy: Secondary | ICD-10-CM | POA: Diagnosis not present

## 2019-10-04 ENCOUNTER — Ambulatory Visit
Admission: RE | Admit: 2019-10-04 | Discharge: 2019-10-04 | Disposition: A | Discharge status: Home / Self Care | Payer: Medicaid Other | Source: Ambulatory Visit | Attending: Radiation Oncology | Admitting: Radiation Oncology

## 2019-10-04 ENCOUNTER — Other Ambulatory Visit: Payer: Self-pay

## 2019-10-04 DIAGNOSIS — Z510 Encounter for antineoplastic radiation therapy: Secondary | ICD-10-CM | POA: Diagnosis not present

## 2019-10-05 ENCOUNTER — Inpatient Hospital Stay: Payer: Medicaid Other | Attending: Oncology

## 2019-10-05 ENCOUNTER — Ambulatory Visit: Payer: Medicaid Other

## 2019-10-05 NOTE — Progress Notes (Signed)
Nutrition  Patient was a no show to radiation treatment today and nutrition follow-up appointment.    Perry Waller, [place], [place] Registered Dietitian [phone] (pager)

## 2019-10-06 ENCOUNTER — Ambulatory Visit: Admission: RE | Admit: 2019-10-06 | Payer: Medicaid Other | Source: Ambulatory Visit

## 2019-10-06 ENCOUNTER — Ambulatory Visit: Payer: Medicaid Other

## 2019-10-07 ENCOUNTER — Ambulatory Visit
Admission: RE | Admit: 2019-10-07 | Discharge: 2019-10-07 | Disposition: A | Discharge status: Home / Self Care | Payer: Medicaid Other | Source: Ambulatory Visit | Attending: Radiation Oncology | Admitting: Radiation Oncology

## 2019-10-07 ENCOUNTER — Other Ambulatory Visit: Payer: Self-pay

## 2019-10-07 ENCOUNTER — Telehealth: Payer: Self-pay

## 2019-10-07 ENCOUNTER — Ambulatory Visit: Payer: Medicaid Other

## 2019-10-07 DIAGNOSIS — Z510 Encounter for antineoplastic radiation therapy: Secondary | ICD-10-CM | POA: Diagnosis not present

## 2019-10-07 NOTE — Telephone Encounter (Signed)
Perry Waller has missed his last two radiation appointments this week. He is scheduled to come in today and Friday to complete the treatments. I called and spoke to his sister today about why those appointments had been missed. She stated that Perry Waller was frustrated with lack of information regarding his treatment and he had thought his treatments were completed. I voiced that Perry Waller meets weekly with Dr. Isidore Moos. [name] and I have been available multiple times to answer and assist Perry Waller. Perry Waller has agreed to come for his treatment today, and will speak to [facility] when he comes. Liliane Channel also plans to speak to her (pts sister today by phone or in person) if she is able to come with him today.

## 2019-10-10 ENCOUNTER — Ambulatory Visit
Admission: RE | Admit: 2019-10-10 | Discharge: 2019-10-10 | Disposition: A | Discharge status: Home / Self Care | Payer: Medicaid Other | Source: Ambulatory Visit | Attending: Radiation Oncology | Admitting: Radiation Oncology

## 2019-10-10 ENCOUNTER — Encounter: Payer: Self-pay | Admitting: Radiation Oncology

## 2019-10-10 ENCOUNTER — Other Ambulatory Visit: Payer: Self-pay

## 2019-10-10 ENCOUNTER — Encounter: Payer: Self-pay | Admitting: *Deleted

## 2019-10-10 DIAGNOSIS — Z510 Encounter for antineoplastic radiation therapy: Secondary | ICD-10-CM | POA: Diagnosis not present

## 2019-10-10 NOTE — Progress Notes (Signed)
Oncology Nurse Navigator Documentation  Met with pt prior to final RT to offer support and to celebrate end of radiation treatment.   Provided verbal/written post-RT guidance:  Importance of keeping all follow-up appts, especially those with Nutrition, SLP and Dr. Isidore Moos.  Noted written 10/23 3:40 appt reminder with Dr. Isidore Moos.  Importance of protecting treatment area from sun.  Continuation of Sonafine application 2-3 times daily, application of abx ointment to areas of raw skin; when supply of Sonafine exhausted transition to OTC lotion with vitamin E. Explained my role as navigator will continue for several more months, I will be calling or joining him during follow-up visits.   I encouraged him to call me with needs/concerns.  He voiced agreement.  [name], RN, BSN Head & Neck Oncology [place] at [place] [phone]

## 2019-10-11 ENCOUNTER — Telehealth: Payer: Self-pay

## 2019-10-11 NOTE — Telephone Encounter (Signed)
Nutrition Follow-up:  Patient with stage IV SCC of the supraglottis.  Patient has completed radiation.    Called patient's nephew Evlyn Courier.  Patient able to speak with RD via phone.  Reports that he has eaten eggs this am and ham sandwich for lunch.  Reports that just has a stinging feeling when he swallows. Continues to drink about 3 ensure per day.   Continues to use tube feeding Monday, Wednesday and Friday, 5 cartons of osmolite 1.5 via continuous pump (7pm to 9am).  Flushes with 185ml of water before starting feeding and when stops feeding.  Reports regular bowel movement, last one this am.    Medications: reviewed  Labs: no new labs  Anthropometrics:   Weight history obtained from [place], Therapist, sports from [place] system  9/28 103 lb 4 oz, 10/5 98 lb 4 oz , 10/12 101 lb 8 oz   Estimated Energy Needs  Kcals: 1560-1800 Protein: 78-90 g Fluid: >1.8 L  NUTRITION DIAGNOSIS: Inadequate oral intake continues   INTERVENTION:  Recommend patient increase tube feeding to 4 times per week vs 3 times per week due to weight loss.  Patient reports that he wants to do tube feeding on Monday, Tuesday, Wednesday and Friday.  Will continue to provide 5 cartons of osmolite 1.5 via tube.   Encouraged patient to continue to eat orally for added calories and protein. Encouraged patient to continue drinking ensure orally for added calories.  Patient knows that he has to continue to drink liquids orally to meet hydration needs.   Patient has contact information    MONITORING, EVALUATION, GOAL: Patient will consume adequate calories via tube feeding and oral intake to prevent weight loss.     NEXT VISIT: patient agreeable to seeing RD face to face on Nov 3rd   Cortne Amara B. Zenia Resides, [place], [place] Registered Dietitian [phone] (pager)

## 2019-10-13 ENCOUNTER — Ambulatory Visit: Payer: Medicaid Other | Attending: Radiation Oncology

## 2019-10-13 ENCOUNTER — Encounter: Payer: Self-pay | Admitting: *Deleted

## 2019-10-13 ENCOUNTER — Other Ambulatory Visit: Payer: Self-pay

## 2019-10-13 DIAGNOSIS — R1313 Dysphagia, pharyngeal phase: Secondary | ICD-10-CM | POA: Insufficient documentation
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
## 2019-10-13 NOTE — Patient Instructions (Signed)
SLP provided another HEP handout for pt today, along with overt s/sx aspiration PNA

## 2019-10-16 NOTE — Progress Notes (Signed)
Oncology Nurse Navigator Documentation  Met with Mr. Metts prior H&N [place] for follow-up with SLP Garald Balding.  [name], RN, BSN Head & Neck Oncology Nurse [place] at [place] [phone]

## 2019-10-19 NOTE — Progress Notes (Signed)
Perry Waller presents for follow up of radiation completed 10/10/19 to his head and neck area.    Pain issues, if any: He report pain to the right side of his throat a 6/10. This pain happens at times when he is swallowing and other times without swallowing. He also reports pain to his right ear.  Using a feeding tube?: Yes, He is doing continuous tube feedings at night.  Weight changes, if any:  Wt Readings from Last 3 Encounters:  10/21/19 106 lb (48.1 kg)  09/09/19 99 lb (44.9 kg)  08/28/19 112 lb 7 oz (51 kg)   Swallowing issues, if any: He is eating any type of foods, he just needs to cut it up well.  Smoking or chewing tobacco? He is smoking 3 cigarettes daily. He is drinking alcohol.  Using fluoride trays daily? N/A Last ENT visit was on: Not since diagnosis.  Other notable issues, if any:  He attends the trach clinic for tracheostomy management.   BP 116/72 (BP Location: Left Arm)   Pulse 98   Temp 99.2 F (37.3 C) (Tympanic)   Wt 106 lb (48.1 kg)   SpO2 100%   BMI 14.78 kg/m

## 2019-10-21 ENCOUNTER — Other Ambulatory Visit: Payer: Self-pay

## 2019-10-21 ENCOUNTER — Encounter: Payer: Self-pay | Admitting: *Deleted

## 2019-10-21 ENCOUNTER — Ambulatory Visit
Admission: RE | Admit: 2019-10-21 | Discharge: 2019-10-21 | Disposition: A | Discharge status: Home / Self Care | Payer: Medicaid Other | Source: Ambulatory Visit | Attending: Radiation Oncology | Admitting: Radiation Oncology

## 2019-10-21 ENCOUNTER — Encounter: Payer: Self-pay | Admitting: Radiation Oncology

## 2019-10-21 VITALS — BP 116/72 | HR 98 | Temp 99.2°F | Wt 106.0 lb

## 2019-10-21 DIAGNOSIS — Z79899 Other long term (current) drug therapy: Secondary | ICD-10-CM | POA: Insufficient documentation

## 2019-10-21 DIAGNOSIS — C329 Malignant neoplasm of larynx, unspecified: Secondary | ICD-10-CM

## 2019-10-21 DIAGNOSIS — R5383 Other fatigue: Secondary | ICD-10-CM | POA: Insufficient documentation

## 2019-10-21 DIAGNOSIS — C321 Malignant neoplasm of supraglottis: Secondary | ICD-10-CM | POA: Insufficient documentation

## 2019-10-21 NOTE — Progress Notes (Signed)
  Patient Name: Perry Waller MRN: [id number] DOB: [birth year] Referring Physician:  Date of Service: 10/10/2019 [facility], [place]                                                        End Of Treatment Note  Diagnoses: C32.1-Malignant neoplasm of supraglottis  Cancer Staging:  Laryngeal cancer [facility]) Staging form: Larynx - Supraglottis, AJCC 8th Edition - Clinical stage from 08/09/2019: Stage IVA (cT4a, cN2c, cM0) - Signed by Eppie Gibson, MD on 08/10/2019  Intent: Curative  Radiation Treatment Dates: 08/17/2019 through 10/10/2019 Site Technique Total Dose (Gy) Dose per Fx (Gy) Completed Fx Beam Energies  Head & neck: HN_larynx IMRT 70/70 2 35/35 6X   Narrative: The patient tolerated radiation therapy relatively well. He experienced some pain and tenderness to his neck with difficulty swallowing. He also reported productive cough, dry mouth, and thick saliva. He denied fatigue and shortness of breath. He also reported using his feeding tube daily. He was noted to have some dry peeling to the neck area, as well as no thrush or mouth sores.  Plan: The patient will follow-up with radiation oncology in 2 weeks.  ________________________________________________ -----------------------------------  Eppie Gibson, MD  This document serves as a record of services personally performed by Eppie Gibson, MD. It was created on her behalf by Wilburn Mylar, a trained medical scribe. The creation of this record is based on the scribe's personal observations and the provider's statements to them. This document has been checked and approved by the attending provider.

## 2019-10-21 NOTE — Progress Notes (Signed)
Radiation Oncology         [phone] ________________________________  Name: Perry Waller MRN: [id number]  Date: 10/21/2019  DOB: 1961-06-07  Follow-Up Visit Note  CC: [name], FNP  [name], FNP  Diagnosis and Prior Radiotherapy:     1. Cancer of supraglottis ([facility])  C32.1  2. Malignant neoplasm of supraglottis ([facility])  C32.1     ICD-10-CM   1. Fatigue, unspecified type  R53.83 TSH  2. Laryngeal cancer ([facility])  C32.9 NM PET Image Initial (PI) Skull Base To Thigh   08/17/2019 through 10/10/2019 Site Technique Total Dose (Gy) Dose per Fx (Gy) Completed Fx Beam Energies  Head & neck: HN_larynx IMRT 70/70 2 35/35 6X    CHIEF COMPLAINT:  Here for follow-up and surveillance of laryngeal cancer  Narrative:  The patient returns today for routine follow-up.   Mr. Boehle presents for follow up of radiation completed 10/10/19 to his head and neck area.   Pain issues, if any: He report pain to the right side of his throat a 6/10. This pain happens at times when he is swallowing and other times without swallowing. He also reports pain to his right ear.  Using a feeding tube?: Yes, He is doing continuous tube feedings at night.  Weight changes, if any:  Wt Readings from Last 3 Encounters:  10/21/19 106 lb (48.1 kg)  09/09/19 99 lb (44.9 kg)  08/28/19 112 lb 7 oz (51 kg)   Swallowing issues, if any: He is eating any type of foods, he just needs to cut it up well.  Smoking or chewing tobacco? He is smoking 3 cigarettes daily. He is drinking alcohol.  Using fluoride trays daily? N/A Last ENT visit was on: Not since diagnosis.  Other notable issues, if any:  He attends the trach clinic for tracheostomy management.   BP 116/72 (BP Location: Left Arm)   Pulse 98   Temp 99.2 F (37.3 C) (Tympanic)   Wt 106 lb (48.1 kg)   SpO2 100%   BMI 14.78 kg/m    Wt Readings from Last 3 Encounters:  10/21/19 106 lb (48.1 kg)  09/09/19 99 lb (44.9 kg)  08/28/19 112 lb 7 oz (51 kg)     ALLERGIES:  is allergic to fish allergy.  Meds: Current Outpatient Medications  Medication Sig Dispense Refill  . benzonatate (TESSALON) 200 MG capsule Take 1 capsule (200 mg total) by mouth 3 (three) times daily as needed for cough. 40 capsule 3  . HYDROcodone-homatropine (HYCODAN) 5-1.5 MG/5ML syrup Take 5 mLs by mouth every 6 (six) hours as needed for cough. 120 mL 0  . lidocaine (XYLOCAINE) 2 % solution Patient: Mix 1part 2% viscous lidocaine, 1part H20. Swallow 74mL of diluted mixture, 15min before meals and at bedtime, up to QID (Patient not taking: Reported on 10/21/2019) 100 mL 5   No current facility-administered medications for this encounter.     Physical Findings: The patient is in no acute distress. Patient is alert and oriented. Wt Readings from Last 3 Encounters:  10/21/19 106 lb (48.1 kg)  09/09/19 99 lb (44.9 kg)  08/28/19 112 lb 7 oz (51 kg)    weight is 106 lb (48.1 kg). His tympanic temperature is 99.2 F (37.3 C). His blood pressure is 116/72 and his pulse is 98. His oxygen saturation is 100%. .  General: Alert and oriented, in no acute distress, in a WC HEENT: Head is normocephalic. Extraocular movements are intact. Oropharynx is notable for no lesions Neck:  Neck is notable for trach, clean and intact Skin: Skin in treatment fields shows satisfactory healing    Lab Findings: Lab Results  Component Value Date   WBC 8.3 08/15/2019   HGB 10.6 (L) 08/15/2019   HCT 33.7 (L) 08/15/2019   MCV 97.1 08/15/2019   PLT 255 08/15/2019    Lab Results  Component Value Date   TSH 0.924 08/05/2019    Radiographic Findings: No results found.  Impression/Plan:    1) Head and Neck Cancer Status: healing from RT  2) Nutritional Status: stabilizing PEG tube: still using  3) Risk Factors: The patient has been educated about risk factors including alcohol and tobacco abuse; they understand that avoidance of alcohol and tobacco is important to prevent recurrences as  well as other cancers  -- still smoking, drinking ETOH  4) Swallowing: continue SLP  5) Dental: poor dentition, advised to follow oral /dental care   6) Thyroid function: check TSH annually Lab Results  Component Value Date   TSH 0.924 08/05/2019    7)  Follow-up in mid to late January with restaging PET.  The patient was encouraged to call with any issues or questions before then.  I spent 15 minutes minutes face to face with the patient and more than 50% of that time was spent in counseling and/or coordination of care. _____________________________________   Eppie Gibson, MD  This document serves as a record of services personally performed by Eppie Gibson, MD. It was created on her behalf by Wilburn Mylar, a trained medical scribe. The creation of this record is based on the scribe's personal observations and the provider's statements to them. This document has been checked and approved by the attending provider.

## 2019-10-22 NOTE — Progress Notes (Signed)
Oncology Nurse Navigator Documentation  To provide support, encouragement and care continuity, met with Perry Waller during 2 wk post-tmt follow-up with Dr. Isidore Waller.  He arrived in [facility], was accompanied by his sister. I reinforced Dr. Pearlie Waller:  Explanation of reason for waiting several months before conducting post-tmt imaging, voiced understanding plan is to conduct imaging in January.  Guidance to report to ER for excessive bleeding from throat.  Guidance to call if concerned about palpable swellings in neck, other concerning symptoms. They voiced understanding RTC to see Dr. Isidore Waller after completion of January scan. I again provided my phone # to sister.  She/Perry Waller agreed to call me with questions/concerns. I escorted them to [facility] lobby entrance following appt.  Perry Orem, RN, BSN Head & Neck Oncology Nurse [place] at [place] [phone]

## 2019-10-28 ENCOUNTER — Encounter: Payer: Self-pay | Admitting: Radiation Oncology

## 2019-10-31 ENCOUNTER — Telehealth: Payer: Self-pay

## 2019-10-31 NOTE — Telephone Encounter (Signed)
Nutrition  Called patient's nephew Evlyn Courier at patient's request to remind about nutrition appointment tomorrow at 1:45.  Spoke with Jerrell and confirmed appt.   Chaniya Genter B. Zenia Resides, [place], [place] Registered Dietitian [phone] (pager)

## 2019-11-01 ENCOUNTER — Other Ambulatory Visit: Payer: Self-pay

## 2019-11-01 ENCOUNTER — Inpatient Hospital Stay: Payer: Medicaid Other | Attending: Oncology

## 2019-11-01 NOTE — Progress Notes (Signed)
Nutrition Follow-up:  Patient with stage IV SCC of the supraglottis.  Patient has completed radiation.  Met with patient in clinic today for nutrition follow-up.  Patient reports that he is doing good.  Reports that he has been doing his nightly tube feeding of osmolite 1.5 via pump (5 cartons) on Monday, Tuesday, Wednesday, Friday and Saturday. Patient reports that nephew or niece give 3 syringe full of water at the start of tube feeding and when stops feeding.  Reports that he felt full this am when turned off tube feeding so did not eat much.  Reports that he gets full with tube feeding.  Eats sometimes 1-3 meals per day.  Reports that he can tolerate all types of foods.  Sister prepared chicken, rice, beans and cornbread last night for dinner that he ate without difficulty.  Reports that he is drinking 2 ensure daily.  Also drinking water, juice, tea, occasional beer.  States he is trying to stop drinking alcohol.  Denies nausea, vomiting, constipation of diarrhea.  Reports has regular bowel movement usually every morning.    Noted SLP follow-up on 11/19  Medications: reviewed  Labs: reviewed  Anthropometrics:   Weight measured today in clinic 105 lb.  Noted on 10/23 visit with Dr. Isidore Moos 106 lb.  10/12 101 lb 8 oz, 10/5 98 lb 4 oz.    Estimated Energy Needs  Kcals: 1560-1800 Protein: 78-90 g Fluid: > 1.8 L  NUTRITION DIAGNOSIS: Inadequate oral intake continues   INTERVENTION:  Patient to continue with giving tube feeding 5 nights per week (5 cartons of osmolite 1.5) via continuous pump.  Flushing with 157m before and after tube feeding. Patient resistant to give more tube feeding due to full feeling.  Weight increasing so hold off for now.  Patient aware that if starts loosing weight would recommend adding 6th day of tube feeding.  Provided another case of ensure enlive to patient today. Encouraged to drink 2 per day at least. Encouraged solid foods per SLP as able for added  calories and protein.  Discussed with patient again the importance of drinking fluids orally to meet hydration needs.  Current free water flush will not meet hydration needs.  Patient verbalized understanding.  Encouraged patient to stop drinking alcohol.   Patient has contact information     MONITORING, EVALUATION, GOAL: Patient will consume adequate calories via tube feeding and oral intake to prevent weight loss.     NEXT VISIT: December 1 clinic visit, ck weight  Ed Rayson B. [place]enia Resides [place] Gabriel[place]istered Dietitian [phone](pager)

## 2019-11-16 ENCOUNTER — Telehealth: Payer: Self-pay | Admitting: *Deleted

## 2019-11-16 NOTE — Telephone Encounter (Signed)
Oncology Nurse Navigator Documentation  In support of appointment compliance for H&N pt follow-up with SLP Garald Balding, called patient, spoke with sister Blain Pais, confirmed understanding of tomorrow's 2:00 appt at [facility], encouraged arrival 15-20 minutes prior to appt for registration and arrival to Radiation Waiting.    [name], RN, BSN Head & Neck Oncology Nurse [place] at [place] [phone]

## 2019-11-17 ENCOUNTER — Other Ambulatory Visit: Payer: Self-pay

## 2019-11-17 ENCOUNTER — Ambulatory Visit: Payer: Medicaid Other | Attending: Radiation Oncology

## 2019-11-17 DIAGNOSIS — R1313 Dysphagia, pharyngeal phase: Secondary | ICD-10-CM | POA: Diagnosis not present
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
## 2019-11-28 ENCOUNTER — Telehealth: Payer: Self-pay

## 2019-11-28 NOTE — Telephone Encounter (Signed)
Nutrition  Called nephew Laren Everts but no answer or able to leave voicemail.  Called sister and reminded her of nutrition appointment for patient at 1:45pm.  She confirmed appointment.    Jocelyn Lowery B. Zenia Resides, [place], [place] Registered Dietitian [phone] (pager)

## 2019-11-29 ENCOUNTER — Other Ambulatory Visit: Payer: Self-pay

## 2019-11-29 ENCOUNTER — Telehealth: Payer: Self-pay

## 2019-11-29 ENCOUNTER — Inpatient Hospital Stay: Payer: Medicaid Other

## 2019-11-29 NOTE — Telephone Encounter (Signed)
Patient came in to reschedule

## 2019-11-29 NOTE — Progress Notes (Signed)
Nutrition  Patient was a no show for nutrition appointment today at 1:45pm.   Sherice Ijames B. Zenia Resides, [place], [place] Registered Dietitian [phone] (pager)

## 2019-12-06 ENCOUNTER — Inpatient Hospital Stay: Payer: Medicaid Other | Attending: Oncology

## 2019-12-06 ENCOUNTER — Other Ambulatory Visit: Payer: Self-pay

## 2019-12-06 NOTE — Progress Notes (Signed)
Nutrition Follow-up:  Patient with stage IV SCC of the supraglottis.  Patient has completed radiation.    Met with patient in clinic today for nutrition follow-up.  Patient reports that he giving osmolite 1.5 via continuous pump (5 cartons per night) 4 nights per week typically.  Nephew or other family members help patient with tube feeding pump.  Reports Friday the tube got clogged up.  Patient reports that nephew or niece give 3 syringes of water at the start of feeding and when feeding stops.  Concerned about gastric juices that are flowing back into the tube.  Patient reports that he gets full with running tube feeding during the night.    Patient is eating orally and reviewed SLP notes from 11/19 visit and noted no signs of aspiration. Noted SLP recommended dysphagia 3 with thin liquids.  Patient concerned about trach and has talked to [place] today about this.  Patient reports that he is tolerating all types of foods without difficulty. Typically eats 3 meals per day and always gets in at least 2 ensure enlive daily.  "I like them."  Reports bowels are moving normally. No issues with nausea, vomiting.   Medications: reviewed  Labs: no new   Anthropometrics:   Weight checked today in RD office of 108 lb 8 oz (was wearing lightweight jacket).  Increased from last visit of 105 lb.     Estimated Energy Needs  Kcals: 1560-1800 Protein: 78-90 g Fluid: > 1.8 L  NUTRITION DIAGNOSIS: Inadequate oral intake improving   INTERVENTION:  Recommend patient give osmolite 1.5 (5 cartons) via pump 3 nights vs 4 nights with weight increase and eating well.  Continue to flush with 126m of water before and after feeding.   Flushed tube for patient in clinic today and flushes well.   Provided another case of ensure enlive to patient today. Encouraged to drink at least 2 per day Reviewed importance of eating high calorie, high protein foods to maintain weight with tube feeding decrease.  Reviewed with  patient goal would be for tube feeding to be decreased and oral intake to increase and for weight to be maintained.   Reviewed with patient that he needs to continue to drink fluids orally to maintain hydration.   Provided contact information for [place] patient reports he is running low on supplies (syringes) Patient has contact information    MONITORING, EVALUATION, GOAL: patient will consume adequate calories and protein via tube feeding and oral intake to prevent weight loss   NEXT VISIT: Jan 7th after SLP visit, provided written appointment to patient  Anevay Campanella B. [place]enia Resides [place] L[place]hel HeightsRegistered Dietitian [phone](pager)

## 2019-12-08 ENCOUNTER — Telehealth: Payer: Self-pay | Admitting: *Deleted

## 2019-12-08 NOTE — Telephone Encounter (Signed)
Oncology Nurse Navigator Documentation  In follow-up to 12/8 conversation w/ pt re trach supplies, called sister to obtain clarification of needs.  Unable to LVMM.  Will call again.  Called ENT Dr. Janace Hoard' office, LVMM for his MA Margarita Grizzle asking for call-back re scheduling follow-up appt for trach mgt.    [name], RN, BSN Head & Neck Oncology Nurse [place] at [place] [phone]

## 2019-12-09 ENCOUNTER — Ambulatory Visit: Payer: Medicaid Other | Admitting: Family Medicine

## 2019-12-26 ENCOUNTER — Other Ambulatory Visit: Payer: Self-pay

## 2019-12-26 ENCOUNTER — Emergency Department (HOSPITAL_COMMUNITY)
Admission: EM | Admit: 2019-12-26 | Discharge: 2019-12-26 | Disposition: A | Discharge status: Home / Self Care | Payer: Medicaid Other | Attending: Emergency Medicine | Admitting: Emergency Medicine

## 2019-12-26 DIAGNOSIS — J9500 Unspecified tracheostomy complication: Secondary | ICD-10-CM

## 2019-12-26 DIAGNOSIS — F17210 Nicotine dependence, cigarettes, uncomplicated: Secondary | ICD-10-CM | POA: Diagnosis not present

## 2019-12-26 DIAGNOSIS — J449 Chronic obstructive pulmonary disease, unspecified: Secondary | ICD-10-CM | POA: Insufficient documentation

## 2019-12-26 DIAGNOSIS — Z66 Do not resuscitate: Secondary | ICD-10-CM | POA: Insufficient documentation

## 2019-12-26 DIAGNOSIS — Z79899 Other long term (current) drug therapy: Secondary | ICD-10-CM | POA: Diagnosis not present

## 2019-12-26 DIAGNOSIS — Z8521 Personal history of malignant neoplasm of larynx: Secondary | ICD-10-CM | POA: Diagnosis not present

## 2019-12-26 DIAGNOSIS — J9503 Malfunction of tracheostomy stoma: Secondary | ICD-10-CM | POA: Diagnosis not present
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
## 2019-12-26 NOTE — ED Triage Notes (Signed)
Pt here d/t trach tube falling out this morning 3-4 hours ago. Wears 6 shiley, uncuffed. Pt not having difficulty breathing.

## 2019-12-26 NOTE — Discharge Instructions (Signed)
Please call Dr. Peggyann Juba office for close follow up of your tracheostomy tube. Return if you have any concerns.

## 2020-01-04 ENCOUNTER — Telehealth: Payer: Self-pay | Admitting: *Deleted

## 2020-01-04 NOTE — Telephone Encounter (Signed)
Oncology Nurse Navigator Documentation  In support of appointment compliance for H&N patient follow-up with SLP Garald Balding, called patient, spoke with his nephew who confirmed understanding of tomorrow's appointment at [facility], indicated ride already confirmed.  Reminded him to arrive to Radiation Waiting following registration.   [name], RN, BSN Head & Neck Oncology Nurse [place] at [place] [phone]

## 2020-01-05 ENCOUNTER — Ambulatory Visit: Payer: Medicaid Other | Attending: Radiation Oncology

## 2020-01-05 ENCOUNTER — Other Ambulatory Visit: Payer: Self-pay

## 2020-01-05 ENCOUNTER — Inpatient Hospital Stay: Payer: Medicaid Other | Attending: Oncology | Admitting: Nutrition

## 2020-01-05 DIAGNOSIS — R1313 Dysphagia, pharyngeal phase: Secondary | ICD-10-CM

## 2020-01-05 NOTE — Therapy (Addendum)
[facility] [address] [place], [place], [address] Phone: [phone]   Fax:  [place] (no charge)  Patient Details  Name: Perry Waller MRN: [id number] Date of Birth: 08/28/1961 Referring Provider (SLP): Eppie Gibson, MD   Encounter Date: 01/05/2020     There were no vitals filed for this visit.  Subjective Assessment - 01/05/20 1424    Subjective  Pt arrived at [facility] instead of [facility], despite having family told [place]. SLP traveled to [facility] from [place] to see pt today.        ADULT SLP TREATMENT - 01/05/20 1449      General Information   Behavior/Cognition  Alert;Agitated      Pt arrived at [facility] [facility] office) instead of the cancer center as told yesterday to pt's nephew by [name]. So, SLP drove back to [facility] to see pt.  Perry Waller arrived to ST room agitated that there has been no follow up for his trach. He told SLP that he had been to ED last week for them to replace his trach. ED notes dated 12-26-19 state pt arrived to ED with trach out. See those notes for more details.  SLP spent approx 15 minutes in attempt to de-escalate the pts agitation, explaining in different ways to pt that outpatient SLP does not have training with trach or with trach supplies -and the professional with that knowledge would be pt's ENT. Pt did not know name of his ENT so SLP researched Epic and found Dr. Janace Hoard placed pt's trach in August 2020. SLP saw Dione Housekeeper note from December 2020 stating that he had LVMM for Dr Janace Hoard' [place] for information on trach follow up. Pt told SLP he has been having intermittent difficulty breathing as well as occasional/frequent coughing. Occasionally pt reported he has copious clear secretions and that these secretions impede pt's breathing and induce coughing sometimes to the point of vomiting.  SLP asked pt about his swallowing but pt became more agitated re: the  trouble with his trach. SLP did ascertain that pt has PO Ensure, water, and coffee, but pt also stated "They told me Ensure, and water, and that was all I could have." SLP then offered to call Dr. Janace Hoard' office which pt affirmed he would appreciate. Pt and SLP spoke to [place] and SLP informed Margarita Grizzle pt went to ED and had trach placed after it's removal, and was d/c'd home. Margarita Grizzle offered some appointment times for a follow up with Dr. Janace Hoard and pt agreed upon (after Margarita Grizzle and SLP confirmed pt would not have transportation problem) 01-10-20 at 1310.  Pt will be rescheduled for follow up ST in 2-4 weeks. Given pt's last MBSS was prior to rad tx (August 2020), MD may want to consider a follow up MBSS or FEES to assess pt's current swallow function.   SLP Short Term Goals - 10/13/19 1214      SLP SHORT TERM GOAL #1   Title  pt will demo understanding of proper procedure for HEP with occasional min A    Status  Not Met      SLP SHORT TERM GOAL #2   Title  pt will tell SLP 3 overt s/sx aspiration PNA    Status  Achieved       SLP Long Term Goals - 11/17/19 1425      SLP LONG TERM GOAL #1   Title  pt will demo understanding of proper HEP procedure  with modified independence    Time  1    Period  --   sessions (visit #4)   Status  Revised   omitted "over two sessions"     SLP LONG TERM GOAL #2   Title  pt will undergo follow up objective swallow study PRN    Time  2    Period  --   visits   Status  Deferred           Patient will benefit from skilled therapeutic intervention in order to improve the following deficits and impairments:   Dysphagia, pharyngeal phase    Problem List Patient Active Problem List   Diagnosis Date Noted  . Palliative care encounter   . DNR (do not resuscitate)   . Goals of care, counseling/discussion   . Malignant neoplasm of supraglottis ([place]) 08/09/2019  . Protein-calorie malnutrition, severe 08/07/2019  . Neck mass 08/05/2019  . Hypokalemia  08/05/2019  . Tobacco abuse 08/05/2019  . Constipation 08/05/2019  . Acute respiratory failure with hypoxia ([place]) 06/30/2019  . COPD exacerbation ([place]) 06/30/2019    [facility] ,[place], [place]  01/05/2020, 3:25 PM  [facility] [address] [place], [place], [address] Phone: [phone]   Fax:  [phone]   Name: Perry Waller MRN: [id number] Date of Birth: 13-Jan-1961

## 2020-01-06 ENCOUNTER — Encounter: Payer: Self-pay | Admitting: Nutrition

## 2020-01-06 NOTE — Progress Notes (Signed)
Patient did not show up for nutrition consult.

## 2020-01-09 ENCOUNTER — Institutional Professional Consult (permissible substitution): Payer: Medicaid Other | Admitting: Pulmonary Disease

## 2020-01-09 ENCOUNTER — Other Ambulatory Visit: Payer: Self-pay

## 2020-01-10 ENCOUNTER — Other Ambulatory Visit (HOSPITAL_COMMUNITY): Payer: Self-pay

## 2020-01-10 ENCOUNTER — Other Ambulatory Visit: Payer: Self-pay | Admitting: Radiation Oncology

## 2020-01-10 ENCOUNTER — Telehealth: Payer: Self-pay | Admitting: *Deleted

## 2020-01-10 DIAGNOSIS — C329 Malignant neoplasm of larynx, unspecified: Secondary | ICD-10-CM

## 2020-01-10 DIAGNOSIS — R1310 Dysphagia, unspecified: Secondary | ICD-10-CM

## 2020-01-10 DIAGNOSIS — C321 Malignant neoplasm of supraglottis: Secondary | ICD-10-CM

## 2020-01-10 NOTE — Telephone Encounter (Signed)
CALLED PATIENT TO INFORM OF PET SCAN FOR 01-17-20 @ [facility], PT. TO AVOID CARBS THE MEAL BEFORE TEST, PATIENT TO BE NPO- 6 HRS. PRIOR TO TEST, PATIENT TO ABSTAIN FROM SUCKING HARD CANDY AND CHEWING GUM, PATIENT TO ARRIVE @ 12:30 PM @ [facility], PATIENT TO RECEIVE RESULTS FROM DR. SQUIRE ON 01-18-20 @ 11:40 AM, UNABLE TO LEAVE MESSAGE VM FULL, WILL CALL LATER

## 2020-01-11 ENCOUNTER — Telehealth: Payer: Self-pay | Admitting: *Deleted

## 2020-01-11 NOTE — Telephone Encounter (Signed)
CALLED PATIENT TO INFORM OF TESTS, NO ANSWER, WILL CALL LATER

## 2020-01-11 NOTE — Telephone Encounter (Signed)
XXXX 

## 2020-01-11 NOTE — Telephone Encounter (Signed)
CALLED PATIENT'S SISTER- HENRIETTE NICKLESON TO INFORM OF HER BROTHER'S PET SCAN FOR 01-17-20 - ARRIVAL TIME- 12:30 PM @ [facility], PT. TO AVOID CARBS THE MEAL BEFORE TEST AND PATIENT TO BE NPO- 6 HRS. PRIOR TO TEST, PATIENT TO NOT CHEW GUM OR SUCK ANY HARD CANDY, PT. TO RECEIVE RESULTS FROM DR. SQUIRE ON 01-18-20, SPOKE WITH PATIENT'S SISTER HENRIETTE NICKLESON AND SHE IS AWARE OF THESE APPTS.

## 2020-01-12 ENCOUNTER — Telehealth: Payer: Self-pay | Admitting: *Deleted

## 2020-01-12 NOTE — Telephone Encounter (Signed)
CALLED PATIENT'S [place] TO INFORM OF MBSS FOR 01-18-20 - ARRIVAL TIME- 12:30 PM @ [facility], SPOKE WITH PATIENT'S RELATIVE HENRIETTE NICHOLSON AND SHE IS AWARE OF THIS TEST

## 2020-01-13 ENCOUNTER — Ambulatory Visit: Payer: Self-pay | Admitting: Radiation Oncology

## 2020-01-17 ENCOUNTER — Encounter (HOSPITAL_COMMUNITY): Payer: Self-pay

## 2020-01-17 ENCOUNTER — Ambulatory Visit (HOSPITAL_COMMUNITY)
Admission: RE | Admit: 2020-01-17 | Discharge: 2020-01-17 | Disposition: A | Discharge status: Home / Self Care | Payer: Medicaid Other | Source: Ambulatory Visit | Attending: Radiation Oncology | Admitting: Radiation Oncology

## 2020-01-17 ENCOUNTER — Other Ambulatory Visit: Payer: Self-pay

## 2020-01-17 ENCOUNTER — Emergency Department (HOSPITAL_COMMUNITY)
Admission: EM | Admit: 2020-01-17 | Discharge: 2020-01-17 | Disposition: A | Discharge status: Home / Self Care | Payer: Medicaid Other | Attending: Emergency Medicine | Admitting: Emergency Medicine

## 2020-01-17 DIAGNOSIS — Z430 Encounter for attention to tracheostomy: Secondary | ICD-10-CM | POA: Diagnosis not present

## 2020-01-17 DIAGNOSIS — C321 Malignant neoplasm of supraglottis: Secondary | ICD-10-CM | POA: Diagnosis not present

## 2020-01-17 DIAGNOSIS — C329 Malignant neoplasm of larynx, unspecified: Secondary | ICD-10-CM | POA: Diagnosis not present

## 2020-01-17 DIAGNOSIS — Z79899 Other long term (current) drug therapy: Secondary | ICD-10-CM | POA: Diagnosis not present

## 2020-01-17 DIAGNOSIS — F17210 Nicotine dependence, cigarettes, uncomplicated: Secondary | ICD-10-CM | POA: Diagnosis not present

## 2020-01-17 DIAGNOSIS — J449 Chronic obstructive pulmonary disease, unspecified: Secondary | ICD-10-CM | POA: Insufficient documentation

## 2020-01-17 DIAGNOSIS — J9503 Malfunction of tracheostomy stoma: Secondary | ICD-10-CM

## 2020-01-17 LAB — GLUCOSE, CAPILLARY: Glucose-Capillary: 106 mg/dL — ABNORMAL HIGH (ref 70–99)

## 2020-01-17 MED ORDER — FLUDEOXYGLUCOSE F - 18 (FDG) INJECTION
5.6200 | Freq: Once | INTRAVENOUS | Status: AC
  Administered 2020-01-17: 5.62 via INTRAVENOUS

## 2020-01-17 NOTE — ED Triage Notes (Signed)
Patient was outpatient radiology due to patient complaining of difficulties breathing. Patient state his inner cannula fell off three days ago and he was unable to put it back. patient is not in respiratory distress at this time. RT made aware.

## 2020-01-17 NOTE — Discharge Instructions (Addendum)
Continue home trach care with humidifier and saline.  Our case manager will call to make sure you have the materials you need.
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
## 2020-01-17 NOTE — Progress Notes (Cosign Needed Addendum)
TOC CM received a call from niece, Sears Oran, states pt lives in home with her and his sister. Novelty for trach supplies to be delivered to home. Offered choice for [facility]. Niece agreeable to agency that will accept his insurance. Jonnie Finner RN CCM, [facility] ED TOC Jearld Lesch [phone]   01/19/2020 2:52 pm Waiting confirmation from [facility] agency on acceptance of referral. CM spoke to niece, Audry Pecina # [phone]. States they assist pt with his trach. He has suction, she is not sure about supplies. [place] for delivery of supplies. Niece states pt would benefit greatly from having HH. [place], [place] ED TOC CM [phone]

## 2020-01-17 NOTE — Progress Notes (Signed)
Dr.Curatolo and Angie RT changed pts trach from a 4 cuffless shiley to a new 4 cuffless shiley due to hardened secretions almost fully occluding pts aiway. Pts o2 saturations were 96% post trach changed. EZCAP used to confirm correct placement of trach. Pt is feeling much better after trach change. Education was provided on proper cleaning and management of trach.

## 2020-01-18 ENCOUNTER — Encounter: Payer: Self-pay | Admitting: Pulmonary Disease

## 2020-01-18 ENCOUNTER — Ambulatory Visit (HOSPITAL_COMMUNITY)
Admission: RE | Admit: 2020-01-18 | Discharge: 2020-01-18 | Disposition: A | Discharge status: Home / Self Care | Payer: Medicaid Other | Source: Ambulatory Visit | Attending: Radiation Oncology | Admitting: Radiation Oncology

## 2020-01-18 ENCOUNTER — Ambulatory Visit: Payer: Medicaid Other | Admitting: Pulmonary Disease

## 2020-01-18 ENCOUNTER — Encounter: Payer: Self-pay | Admitting: Radiation Oncology

## 2020-01-18 ENCOUNTER — Other Ambulatory Visit: Payer: Self-pay

## 2020-01-18 ENCOUNTER — Encounter: Payer: Self-pay | Admitting: *Deleted

## 2020-01-18 ENCOUNTER — Ambulatory Visit
Admission: RE | Admit: 2020-01-18 | Discharge: 2020-01-18 | Disposition: A | Discharge status: Home / Self Care | Payer: Medicaid Other | Source: Ambulatory Visit | Attending: Radiation Oncology | Admitting: Radiation Oncology

## 2020-01-18 VITALS — BP 100/80 | HR 109 | Temp 97.7°F | Ht 68.0 in | Wt 99.6 lb

## 2020-01-18 DIAGNOSIS — I700 Atherosclerosis of aorta: Secondary | ICD-10-CM | POA: Insufficient documentation

## 2020-01-18 DIAGNOSIS — R634 Abnormal weight loss: Secondary | ICD-10-CM | POA: Insufficient documentation

## 2020-01-18 DIAGNOSIS — J441 Chronic obstructive pulmonary disease with (acute) exacerbation: Secondary | ICD-10-CM | POA: Diagnosis not present

## 2020-01-18 DIAGNOSIS — Z923 Personal history of irradiation: Secondary | ICD-10-CM | POA: Diagnosis not present

## 2020-01-18 DIAGNOSIS — R1310 Dysphagia, unspecified: Secondary | ICD-10-CM | POA: Diagnosis not present

## 2020-01-18 DIAGNOSIS — C321 Malignant neoplasm of supraglottis: Secondary | ICD-10-CM

## 2020-01-18 DIAGNOSIS — Z79899 Other long term (current) drug therapy: Secondary | ICD-10-CM | POA: Insufficient documentation

## 2020-01-18 DIAGNOSIS — J432 Centrilobular emphysema: Secondary | ICD-10-CM | POA: Diagnosis not present

## 2020-01-18 DIAGNOSIS — J439 Emphysema, unspecified: Secondary | ICD-10-CM | POA: Diagnosis not present

## 2020-01-18 HISTORY — DX: Personal history of irradiation: Z92.3

## 2020-01-18 MED ORDER — VITAMIN E 180 MG (400 UNIT) PO CAPS: ORAL_CAPSULE | ORAL | 5 refills | Status: DC

## 2020-01-18 MED ORDER — PENTOXIFYLLINE ER 400 MG PO TBCR: EXTENDED_RELEASE_TABLET | ORAL | 5 refills | Status: DC

## 2020-01-18 MED ORDER — PREDNISONE 10 MG PO TABS: 30.0000 mg | ORAL_TABLET | Freq: Every day | ORAL | 0 refills | Status: AC

## 2020-01-18 MED ORDER — STIOLTO RESPIMAT 2.5-2.5 MCG/ACT IN AERS: 2.0000 | INHALATION_SPRAY | Freq: Every day | RESPIRATORY_TRACT | 0 refills | Status: DC

## 2020-01-18 MED ORDER — STIOLTO RESPIMAT 2.5-2.5 MCG/ACT IN AERS: 2.0000 | INHALATION_SPRAY | Freq: Every day | RESPIRATORY_TRACT | 6 refills | Status: DC

## 2020-01-18 MED ORDER — BENZONATATE 200 MG PO CAPS: 200.0000 mg | ORAL_CAPSULE | Freq: Three times a day (TID) | ORAL | 5 refills | Status: DC | PRN

## 2020-01-18 NOTE — Patient Instructions (Addendum)
Severe emphysema, active exacerbation START Stioloto TWO puffs ONCE a day. This is your EVERYDAY medication START Albuterol as needed for shortness of breath or wheezing  Take prednisone 30 mg daily x 5 days. Encourage Ensure or daily multivitamin  Follow-up in 3 months

## 2020-01-18 NOTE — Progress Notes (Signed)
Radiation Oncology         [phone]) [phone] ________________________________  Name: Perry Waller MRN: [id number]  Date: 01/18/2020  DOB: [birth year]  Follow-Up Visit Note in person  CC: Perry Garter, MD  Perry Boast, FNP  Diagnosis and Prior Radiotherapy:         ICD-10-CM   1. 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 Cancer of supraglottis (HCC)  C32.1 benzonatate (TESSALON) 200 MG capsule   08/17/2019 through 10/10/2019 Site Technique Total Dose (Gy) Dose per Fx (Gy) Completed Fx Beam Energies  Head & neck: HN_larynx IMRT 70/70 2 35/35 6X    CHIEF COMPLAINT:  Here for follow-up and surveillance of laryngeal cancer  Narrative:  The patient returns today for routine follow-up of radiation completed 10/10/19 to his head and neck area.   He underwent PET scan yesterday, 01/17/2020, which showed: improved appearance of airway narrowing when compared to previous study with reduced bulky appearance particularly on right anterolateral margin of the airway; uncertain significance of FDG uptake in bilateral mediastinal lymph nodes, though unchanged in size; new ground-glass/semi-solid opacity in superior segment of left lower lobe, not present on previous CT. I personally reviewed his images at tumor board this morning and then presented the patient's images to him today.  Since his last visit, his #6 trach came out. He went to the ED on 12/26/2019 and had a #4 placed. Per recent note from Dr. Janace Waller on 01/11/2020, this is a better fit for him. He also underwent laryngoscopy at that visit, which showed: laryngeal edema but no obvious exophytic tumor however he has a crust that is in the anterior glottis that needs to be watched carefully.  He intends to follow-up with the patient in 2 more weeks and Perry Orem, RN, our Head and Neck Oncology Navigator will verify that  However, when he presented for PET  scan, he reported issues placing his inner cannula back in for the past 4 days. He was referred to the ED and had his trach exchanged without issue.  Perry Orem, RN, our Head and Neck Oncology Navigator has met with the patient today to make sure that he has the necessary supplies for trach care.  The patient reports that not having necessary supplies has been his main barrier to trach management    Using a feeding tube?:  He reports that he is taking all nutrition by mouth now and he has lost weight as seen below.  Despite encouragement he is not willing to use his feeding tube right now.  Perry Waller will arrange follow-up with nutrition again    weight changes, if any:  Wt Readings from Last 3 Encounters:  01/18/20 99 lb 9.6 oz (45.2 kg)  01/18/20 98 lb 6 oz (44.6 kg)  12/06/19 108 lb 8 oz (49.2 kg)   Swallowing issues, if any: Functional, follows with speech-language pathology   Smoking or chewing tobacco?  He is still smoking  Other notable issues, if any: He needs refills on Tessalon Perles.     ALLERGIES:  is allergic to fish allergy.  Meds: Current Outpatient Medications  Medication Sig Dispense Refill  . benzonatate (TESSALON) 200 MG capsule Take 1 capsule (200 mg total) by mouth 3 (three) times daily as needed for cough. 90 capsule 5  . pentoxifylline (TRENTAL) 400 MG CR tablet Take 432m tab daily x 1 week, then 4059mBID; take with  food 60 tablet 5  . vitamin E 180 MG (400 UNITS) capsule Take 400 IU daily x 1 week, then 400 IU BID 60 capsule 5   No current facility-administered medications for this encounter.    Physical Findings: The patient is in no acute distress. Patient is alert and oriented. Wt Readings from Last 3 Encounters:  01/18/20 99 lb 9.6 oz (45.2 kg)  01/18/20 98 lb 6 oz (44.6 kg)  12/06/19 108 lb 8 oz (49.2 kg)    height is 5' 11"  (1.803 m) and weight is 98 lb 6 oz (44.6 kg). His temporal temperature is 98.3 F (36.8 C). His blood pressure is 87/78  (abnormal) and his pulse is 104 (abnormal). His respiration is 18 and oxygen saturation is 100%. .  General: Alert and oriented, in no acute distress, in a WC HEENT: Head is normocephalic. Extraocular movements are intact.  No obvious thrush in the oral cavity. Neck: Neck is notable for trach, clean and intact.  No palpable masses Skin: Skin in treatment fields shows satisfactory healing - intact  Lab Findings: Lab Results  Component Value Date   WBC 8.3 08/15/2019   HGB 10.6 (L) 08/15/2019   HCT 33.7 (L) 08/15/2019   MCV 97.1 08/15/2019   PLT 255 08/15/2019    Lab Results  Component Value Date   TSH 0.924 08/05/2019    Radiographic Findings: NM PET Image Initial (PI) Skull Base To Thigh  Result Date: 01/17/2020 CLINICAL DATA:  Subsequent treatment strategy for head and neck cancer no prior PET but comparison with multiple cross-sectional imaging studies including CT chest, abdomen and pelvis of August 08, 2019. EXAM: NUCLEAR MEDICINE PET SKULL BASE TO THIGH TECHNIQUE: 5.62 mCi F-18 FDG was injected intravenously. Full-ring PET imaging was performed from the skull base to thigh after the radiotracer. CT data was obtained and used for attenuation correction and anatomic localization. Fasting blood glucose: 106 mg/dl COMPARISON:  CT chest of 08/08/2019 FINDINGS: Mediastinal blood pool activity: SUV max 2.7 Liver activity: SUV max 2.9 NECK: Decreased volume of the supraglottic and glottic mass was present on the previous exam. Increased metabolic activity in the area favoring the left vocal fold and larynx (SUVmax = 6.3 Activity near the left arytenoid without measurable lesion in this location (SUVmax = 7.2) Fullness of the right larynx greater than left above the vocal cords and just at the level of the cords is diminished.) Incidental CT findings: Tracheostomy tube now in place. Signs of left maxillary sinus disease, mild. CHEST: Mild increase in subcarinal and bihilar uptake, nodal size  similar to study of 08/08/2019 chest Subcarinal nodal tissue measuring approximately 0.6 cm short axis (SUVmax = 4.9) similar uptake in the bilateral hila without visible, discrete measurable lymph nodes. (Image 77, series 4) also with AP window lymph node measuring 0.7 cm short axis (SUVmax = 3.9) 1.5 by 0.5 cm lesion at the right upper lobe (image 61, series 4) seen on previous CT, potentially an area of scarring and unchanged (SUVmax = 1.7) band like morphology on coronal images. Also with scarring at the left lung apex. New area of sub solid nodularity in the superior segment of the left lower lobe measuring 1.3 x 0.8 cm (SUVmax = 5.5) Incidental CT findings: Biapical emphysematous changes and bullous disease similar to recent chest CT. Signs of material within right lower lobe bronchi and also on the left. ABDOMEN/PELVIS: No abnormal hypermetabolic activity within the liver, pancreas, adrenal glands, or spleen. No hypermetabolic lymph nodes in the  abdomen or pelvis. Incidental CT findings: Extensive calcific atherosclerotic changes throughout the abdominal aorta. G-tube in situ. Signs of prior left femoral ORIF with streak artifact in this region. SKELETON: No focal hypermetabolic activity to suggest skeletal metastasis. Incidental CT findings: Signs of left femoral ORIF. IMPRESSION: 1. Improved appearance of airway narrowing when compared to the previous study with reduced bulky appearance particularly on the right anterolateral margin of the airway just above the level of the cords. Residual FDG uptake in this area is nonspecific following radiation, this will serve as a baseline for future follow-up. 2. FDG uptake in bilateral mediastinal lymph nodes is of uncertain significance given the presence of material within the bronchi, potentially related to low level inflammation. Attention on follow-up, these have not changed in size since the previous imaging study. 3. New ground-glass/semi-solid opacity in the  superior segment of the left lower lobe may be inflammatory. Not present on previous CT making a lung primary less likely. Suggest 3 month follow-up chest CT for further assessment. 4. Bilateral apical scarring and bullous disease in the setting of emphysema. aortic Atherosclerosis (ICD10-I70.0) and Emphysema (ICD10-J43.9). Electronically Signed   By: Zetta Bills M.D.   On: 01/17/2020 15:04    Impression/Plan:    1) Head and Neck Cancer Status: good response to radiotherapy though he has inflammatory tissue versus residual disease in his larynx.  He will continue to follow closely with otolaryngology.  I have prescribed vitamin E and Pentoxifylline in case there is a degree of radiation necrosis in the throat  2) Nutritional Status -he is losing weight.  We talked about nutrition in detail today.  I advised him to start using of his PEG tube again but I am not sure he will.  Therefore I have advised him to flush his PEG tube and  Push food by mouth as best he can.  Perry Waller will arrange nutritional follow-up  3) Risk Factors: The patient has been educated about risk factors including alcohol and tobacco abuse; they understand that avoidance of alcohol and tobacco is important to prevent recurrences as well as other cancers  -- still smoking and again advised to stop    4) Swallowing: continue SLP  5) Dental: Suboptimal dentition, advised to follow oral /dental care   6) Thyroid function: check TSH annually -for some reason this was not performed today as ordered so we will draw the lab at his next appointment Lab Results  Component Value Date   TSH 0.924 08/05/2019    7) In his lung there is subsolid left lower lobe density that may be an early stage low-grade adenocarcinoma of the lung.  Tumor board recommendation for baseline CT scan followed by surveillance imaging every few months.  If it behaves in a suspicious way we will consider biopsy.  Patient is agreeable with this plan.  I will order  CT in 1-2 wks (will call w/ results) and f/u with me in 3.46mo(likely repeat CT chest at that time, too).  8) Cough: Tessalon Perles refilled today.  On date of encounter, in total, I spent 45 minutes on this encounter. _____________________________________   SEppie Gibson MD  This document serves as a record of services personally performed by SEppie Gibson MD. It was created on her behalf by KWilburn Mylar a trained medical scribe. The creation of this record is based on the scribe's personal observations and the provider's statements to them. This document has been checked and approved by the attending provider.

## 2020-01-18 NOTE — Progress Notes (Signed)
Mr. Quintanar presents for follow up of radiation completed 10/10/2019 to his Larynx.    Pain issues, if any: He reports pain when coughing. He tells me that the [place] worked, but his is out of them  Using a feeding tube?: He is not using his feeding tube. He stopped using it 2 weeks ago.  Weight changes, if any:  Wt Readings from Last 3 Encounters:  01/18/20 98 lb 6 oz (44.6 kg)  12/06/19 108 lb 8 oz (49.2 kg)  10/21/19 106 lb (48.1 kg)   Swallowing issues, if any: He denies difficulty swallowing. He reports a decreased appetite. He is not eating. He does report taste changes that continue since completing radiation .  Smoking or chewing tobacco? He is smoking 5 cigarettes daily.  Using fluoride trays daily? N/A Last ENT visit was on: Dr. Janace Hoard 01/11/20 Impression & Plans:  Matisse Salais is a 59 y.o. 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 He obviously needs to tracheotomy and he is more comfortable with a #4. He is breathing well relative to the size of the trach. He is having significant dryness of his secretions and needs to massively increase his humidification processes. He is not using a Lobbyist. He is not using saline. Both of those need to be instituted and we will try to work through a home health agency to get him a humidifier and saline bullets. He is obviously aspirating with significant mucus over his larynx and into his glottis. Not much that I can do regarding that. He hopefully is going to be getting a CT scan and PET scan follow-up from his treatment to see whether there is a persistence within his larynx. I am going to treat him with clindamycin as he may have a mild infection in the glottis and trachea that increasing his secretions. He will start clindamycin. He will need trach changes in the future but not for another 3 to 4 months provided he can increase his humidification and decrease the crusting. It would be nice if he could  get into the trach clinic given he is going to the cancer center and not make him make to separate visits. He needs to follow-up in about 3 weeks to reexamine the larynx and make sure that the crusted area is resolving otherwise he is going to need a direct laryngoscopy and biopsy to confirm there is no residual tumor but will need follow-up scan and PET scan first.. I did speak with Liliane Channel at the cancer center regarding this after the patient had left and he says his PET scan is scheduled January 19. He also will confirm the patient to get into the trach clinic. He also was going to call social worker to see if there is options for him getting his supplies.  [facility] Ot[facility]  Other notable issues, if any:  PET completed 01/17/20  **He went to the Emergency Department after his PET yesterday due mentioning to staff that he had been unable to get his inner cannula in for several days due to poor trach care. His trach was exchanged in the ED by the physician and respiratory therapy.    BP (!) 87/78 (BP Location: Right Arm, Patient Position: Sitting)   Pulse (!) 104   Temp 98.3 F (36.8 C) (Temporal)   Resp 18   Ht 5\' 11"  (1.803 m)   Wt 98 lb 6 oz (44.6 kg)   SpO2 100%  BMI 13.72 kg/m

## 2020-01-18 NOTE — Progress Notes (Signed)
Subjective:   PATIENT ID: Perry Waller GENDER: male DOB: [birth year], MRN: [id number]   HPI  Chief Complaint  Patient presents with  . Consult    Patient is here for COPD consult. Patient has some shortness of breath with exertion.    Reason for Visit: New consult for COPD  59 year old male with head and neck cancer s/p chemoradiation s/p tracheostomy. Daughter is present to provide history.  Reviewed notes from ED on 12/26/19 and 01/17/20 and ENT note from 01/11/20 by Dr. Janace Hoard at [facility] as below: He recently was seen in ED in December for trach dislodgement and this was replaced with #4. He is followed by ENT at [facility] with no plan for trach changes until 3-4 months. Before this visit, he again had trach dislodgement of inner cannula and trach was exchanged in the ED at [facility].  Patient is a long time smoker. Previously smoking 2ppd x40 years. Currently smoking 1/2ppd. He has used a rescue inhaler in the past but never been on maintenance inhalers. He has chronic cough with copious whitish sputum. Associated with wheezing during the day and night. Unable to lay down due to dyspnea. He has shortness of breath with exertion. He is able to walk short distances but still has significant dyspnea which occurs at rest as well.  Social History: Active smoker. Currently 1/2 ppd. Previousl 2ppd x 40 years.  Environmental exposures:  Chemotherapy Radiation Previously worked in cropping tobacco x 30 years  I have personally reviewed patient's past medical/family/social history, allergies, current medications.  Past Medical History:  Diagnosis Date  . Acute respiratory acidosis 06/29/2019  . Arthritis   . COPD (chronic obstructive pulmonary disease) (HCC)      Family History  Problem Relation Age of Onset  . Cancer Father   . Cancer Brother      Social History   Occupational History  . Not on file  Tobacco Use  . Smoking status: Current Every Day Smoker     Packs/day: 0.50    Years: 45.00    Pack years: 22.50    Types: Cigarettes  . Smokeless tobacco: Never Used  . Tobacco comment: he reports smoking 5 cigarettes daily-01/18/20  Substance and Sexual Activity  . Alcohol use: Yes    Comment: one beer weekly.   . Drug use: No  . Sexual activity: Not on file    Allergies  Allergen Reactions  . Fish Allergy Other (See Comments)    Breaks out     Outpatient Medications Prior to Visit  Medication Sig Dispense Refill  . benzonatate (TESSALON) 200 MG capsule Take 1 capsule (200 mg total) by mouth 3 (three) times daily as needed for cough. (Patient not taking: Reported on 01/18/2020) 40 capsule 3  . HYDROcodone-homatropine (HYCODAN) 5-1.5 MG/5ML syrup Take 5 mLs by mouth every 6 (six) hours as needed for cough. (Patient not taking: Reported on 01/18/2020) 120 mL 0  . lidocaine (XYLOCAINE) 2 % solution Patient: Mix 1part 2% viscous lidocaine, 1part H20. Swallow 50mL of diluted mixture, 43min before meals and at bedtime, up to QID (Patient not taking: Reported on 10/21/2019) 100 mL 5   No facility-administered medications prior to visit.    Review of Systems  Constitutional: Positive for weight loss. Negative for chills, diaphoresis, fever and malaise/fatigue.  HENT: Positive for ear pain. Negative for congestion and sore throat.   Respiratory: Positive for shortness of breath. Negative for cough, hemoptysis, sputum production and wheezing.  Cardiovascular: Negative for chest pain, palpitations and leg swelling.  Gastrointestinal: Negative for abdominal pain, heartburn and nausea.  Genitourinary: Negative for frequency.  Musculoskeletal: Negative for joint pain and myalgias.  Skin: Negative for itching and rash.  Neurological: Positive for headaches. Negative for dizziness and weakness.  Endo/Heme/Allergies: Does not bruise/bleed easily.  Psychiatric/Behavioral: Negative for depression. The patient is not nervous/anxious.     Objective:    Vitals:   01/18/20 1513  BP: 100/80  Pulse: (!) 109  Temp: 97.7 F (36.5 C)  TempSrc: Temporal  SpO2: 96%  Weight: 99 lb 9.6 oz (45.2 kg)  Height: 5\' 8"  (1.727 m)      Physical Exam: General: Thin, cachectic male, chronically ill=appearing, no acute distress HENT: [place], AT Neck #4 cuffless trach in place with PMV Eyes: EOMI, no scleral icterus Respiratory: Scattered wheezes bilaterally. No rhonchi Cardiovascular: RRR, -M/R/G, no JVD GI: BS+, soft, nontender Extremities:-Edema,-tenderness Neuro: AAO x4, CNII-XII grossly intact Skin: Intact, no rashes or bruising Psych: Normal mood, normal affect  Data Reviewed:  Imaging: CT Chest 08/08/19 : Severe centrilobular and paraseptal emphysema  PFT: None on file  Imaging, labs and test noted above have been reviewed independently by me.  Assessment & Plan:   Discussion: 59 year old male active smoker with head and neck cancer s/p chemoradiation. Reviewed CT chest which demonstrated extensive panlobular and centrilobular emphysema predominantly in the upper lobes. He has chronic bronchitis and currently in active COPD exacerbation. Counseled patient on clinical course of his emphysema and importance of bronchodilator adherence. Also encourage healthy weight as able.  Severe emphysema, active exacerbation START Stioloto TWO puffs ONCE a day. This is your EVERYDAY medication START Albuterol as needed for shortness of breath or wheezing Take prednisone 30 mg daily x 5 days. Encourage Ensure or daily multivitamin  Tracheostomy in setting of head and neck cancer PMV as tolerated Followed by Speech and ENT  Tobacco abuse Patient is an active smoker. Not interested in quitting. We discussed smoking cessation for 5 minutes. We discussed triggers and stressors and ways to deal with them. We discussed barriers to continued smoking and benefits of smoking cessation.   Health Maintenance Immunization History  Administered Date(s)  Administered  . Pneumococcal Polysaccharide-23 08/07/2019   CT Lung Screen - not indicated  No orders of the defined types were placed in this encounter.  Meds ordered this encounter  Medications  . Tiotropium Bromide-Olodaterol (STIOLTO RESPIMAT) 2.5-2.5 MCG/ACT AERS    Sig: Inhale 2 puffs into the lungs daily.    Dispense:  4 g    Refill:  6  . predniSONE (DELTASONE) 10 MG tablet    Sig: Take 3 tablets (30 mg total) by mouth daily with breakfast for 5 days.    Dispense:  15 tablet    Refill:  0  . Tiotropium Bromide-Olodaterol (STIOLTO RESPIMAT) 2.5-2.5 MCG/ACT AERS    Sig: Inhale 2 puffs into the lungs daily.    Dispense:  4 g    Refill:  0    Order Specific Question:   Lot Number?    Answer:   [id number]    Order Specific Question:   Expiration Date?    Answer:   12/28/2021    Order Specific Question:   Quantity    Answer:   1    Return in about 3 months (around 04/17/2020).  I have spent a total time of 46-minutes on the day of the appointment reviewing prior documentation, coordinating care and discussing medical diagnosis  and plan with the patient/family. Imaging, labs and tests included in this note have been reviewed and interpreted independently by me.  Neche, MD [facility] 01/18/2020 1:34 PM  Office Number [phone]

## 2020-01-21 NOTE — Progress Notes (Signed)
Oncology Nurse Navigator Documentation  Met with Perry Waller during f/u appt with Dr. Isidore Moos.  He was joined by his sister [name].  Discussed his 1/13 visit with ENT Janace Hoard.  He stated:  Denied issues with trach since replacement of #6 Shiley with #4 Shiley.  Understands how to care for/is able to care for trach, does not need appt with [facility] for further education.  [name] confirmed his ability to manage trach, noted "he just won't do it".  Availability of supplies primary barrier to trach care.    They voiced understanding I will follow-up with [facility] re trach supplies.  I provided him 2 boxes of drainage sponges.  Of note:  No audible evidence of secretions during visit.  Drainage sponge CD&I.  I encouraged them to call me with needs/concerns.  [name], RN, BSN Head & Neck Oncology Nurse [place] at [place] [phone]

## 2020-01-26 ENCOUNTER — Encounter: Payer: Medicaid Other | Admitting: Nutrition

## 2020-01-26 ENCOUNTER — Ambulatory Visit: Payer: Medicaid Other

## 2020-01-27 ENCOUNTER — Ambulatory Visit: Payer: Medicaid Other

## 2020-01-27 NOTE — Therapy (Signed)
[facility] [address] [place], [place], [address] Phone: [phone]   Fax:  [phone]  Patient Details  Name: Marshawn Normoyle MRN: [id number] Date of Birth: [birth year] Referring Provider:  Eppie Gibson, MD Encounter Date: 01/27/2020   Pt was rescheduled from his Thursday PM appointment to today t 1530 via telephone call to sister yesterday (01-26-20) by front office staff. Appointment time was confirmed with pt sister again prior to hanging up with her.  Pt no-showed that appointment at 1530 today.   [facility] ,[place], [place]  01/27/2020, 4:36 PM  [facility] [address] [place], [place], [address] Phone: [phone]   Fax:  [phone]

## 2020-02-03 ENCOUNTER — Telehealth: Payer: Self-pay | Admitting: *Deleted

## 2020-02-03 NOTE — Telephone Encounter (Signed)
Called patient to inform of CT for 02-10-20 - arrival time- 2:45 pm @ [facility], no restrictions to test, lvm for a return call

## 2020-02-07 ENCOUNTER — Telehealth: Payer: Self-pay | Admitting: *Deleted

## 2020-02-07 NOTE — Telephone Encounter (Signed)
CALLED PATIENT TO INFORM OF CT FOR 02-10-20 - ARRIVAL TIME- 2:45 PM  @ [facility], NO RESTRICTIONS TO TEST, SPOKE WITH PATIENT'S SISTER- HENRIETTE NICKELSON AND SHE IS AWARE OF THIS TEST

## 2020-02-10 ENCOUNTER — Other Ambulatory Visit: Payer: Self-pay

## 2020-02-10 ENCOUNTER — Ambulatory Visit (HOSPITAL_COMMUNITY)
Admission: RE | Admit: 2020-02-10 | Discharge: 2020-02-10 | Disposition: A | Discharge status: Home / Self Care | Payer: Medicaid Other | Source: Ambulatory Visit | Attending: Radiation Oncology | Admitting: Radiation Oncology

## 2020-02-10 DIAGNOSIS — C321 Malignant neoplasm of supraglottis: Secondary | ICD-10-CM | POA: Insufficient documentation

## 2020-02-13 NOTE — Progress Notes (Signed)
Shared CT chest results with caregiver, (sister) Mrs. Nickelson.  Gave advice on boosting nutrition.  Will follow-up with him in May.  -----------------------------------  Eppie Gibson, MD

## 2020-02-17 ENCOUNTER — Telehealth: Payer: Self-pay | Admitting: *Deleted

## 2020-02-17 NOTE — Telephone Encounter (Signed)
Oncology Nurse Navigator Documentation  Called Mr. Brodhead sister, Verdene Lennert, [place] asking her to return call to confirm receipt of trach supplies last week, to arrange follow-up with ENT Dr. Janace Hoard.  [name], RN, BSN Head & Neck Oncology Nurse [place] at [place] [phone]

## 2020-03-05 ENCOUNTER — Telehealth: Payer: Self-pay | Admitting: Family Medicine

## 2020-03-05 NOTE — Telephone Encounter (Signed)
Pt was called and reminded of there appointment 

## 2020-03-06 ENCOUNTER — Other Ambulatory Visit: Payer: Self-pay

## 2020-03-06 ENCOUNTER — Encounter: Payer: Self-pay | Admitting: Family Medicine

## 2020-03-06 ENCOUNTER — Ambulatory Visit (INDEPENDENT_AMBULATORY_CARE_PROVIDER_SITE_OTHER): Payer: Medicaid Other | Admitting: Family Medicine

## 2020-03-06 VITALS — BP 108/78 | HR 88 | Temp 97.8°F | Resp 18 | Ht 68.0 in | Wt 94.0 lb

## 2020-03-06 DIAGNOSIS — Z430 Encounter for attention to tracheostomy: Secondary | ICD-10-CM | POA: Diagnosis not present

## 2020-03-06 DIAGNOSIS — R636 Underweight: Secondary | ICD-10-CM

## 2020-03-06 DIAGNOSIS — E46 Unspecified protein-calorie malnutrition: Secondary | ICD-10-CM | POA: Diagnosis not present
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
## 2020-03-06 NOTE — Patient Instructions (Signed)
We will follow up with you on tomorrow with your trach appointment after I get in touch with [facility].   I will review all of your past notes       How to Clean a Tracheostomy and Replace Tracheostomy Ties, Adult A tracheostomy, or trach (rhymes with "take"), is a surgically created opening in the trachea that is made in the front of the neck to help with breathing. It is important to keep a trach clean. Doing this helps you:  Keep your skin healthy.  Reduce your risk of infection.  Keep your airway secure. Make sure you follow any specific instructions from your health care provider when you clean a trach and replace trach ties or a holder. What are the risks? This is a safe process. However, problems may occur, including:  Trach tube coming out.  Airway becoming blocked. Supplies needed:  Clean gloves.  [place] or a Airline pilot.  Scissors.  Container to hold liquid.  Sterile water, tap water, or 0.9% saline solution.  Rolled-up blanket or towel.  Cotton swabs.  4 x 4 inch (10 x 10 cm) gauze pads.  Pre-cut pads or gauze for under the faceplate.  Clamp or tweezers. Getting ready  If available, have a second person helping you (helper).  Have all supplies ready and available.  Wash your hands, and have your helper wash his or her hands.  Put on clean gloves, and have your helper put on clean gloves.  If using trach ties: ? Measure a length of tie that can go around the neck twice. ? Cut the end of the tie on a diagonal to a point. This will make it easier to thread through the opening of the faceplate. ? It is important to replace trach ties regularly to keep them from getting damaged or dirty.  If using a trach holder, open the packaging. How to clean a trach and replace trach ties Cleaning the trach 1. Fill a container with the water or 0.9% saline solution. 2. Have the person tip his or her head back a little. This will make it  easier to see and clean the opening in his or her neck. 3. Place a rolled-up blanket or towel under or behind the person's shoulders. 4. If there are any pads under the faceplate, remove them. 5. Wet the cotton swabs with the water or 0.9% saline solution. 6. Clean the trach site, surrounding skin, and neck. 7. Allow the skin to dry. You may pat it dry with a dry 4 x 4 inch (10 x 10 cm) gauze. 8. Replace the trach pads with a pre-cut pad or gauze. Replacing the ties or holder If trach ties are used: 1. While you hold the trach tube, have your helper cut or loosen the ties that are in place. Make sure the tube (trach) stays in place until the new ties are secured. (If working alone, do not remove the old ties until the new ties are secured.) 2. Use a clamp or tweezers, if needed, to thread one end of the new tie through the faceplate or trach flange. Pull through until the ends are even. 3. Bring ties around the back of the neck, then thread one tie through the opposite hole in the faceplate. 4. Gently pull the ties to secure the trach. 5. Secure the tie with a double square knot. For comfort, make sure you can fit 1-2 finger widths between the neck and the tie. If a trach  holder is used:  Have one person hold the trach tube while the other person changes the holder. Make sure the trach tube stays in place until the new ties are secured. (If working alone, apply and secure the new holder before you remove the dirty holder.)  Follow these steps to replace the holder: 1. Position the strap around the back of the neck. 2. Slide the narrow ends under and through the faceplate. 3. Gently pull the ends of the strap so they are tight and secured. For comfort, make sure you can fit 1-2 finger widths between the neck and the strap. Finishing the process  1. Throw away any used supplies. 2. Remove your gloves, and have your helper remove his or her gloves. 3. Wash your hands, and have your helper wash  his or her hands. Summary  A tracheostomy, or trach, is a surgically created opening in the trachea that is made in the front of the neck to help with breathing.  Make sure you follow any specific instructions from your health care provider when you clean a trach and replace trach ties or a holder.  Have all supplies ready before you begin cleaning your trach and replacing your trach ties.  Be sure to wash your hands, and have your helper wash his or her hands throughout care as directed by your health care provider. This information is not intended to replace advice given to you by your health care provider. Make sure you discuss any questions you have with your health care provider. Document Revised: 09/13/2018 Document Reviewed: 09/13/2018 Elsevier Patient Education  [place].

## 2020-03-12 MED ORDER — ENSURE COMPLETE PO LIQD: 237.0000 mL | Freq: Three times a day (TID) | ORAL | 0 refills | Status: DC

## 2020-03-13 ENCOUNTER — Telehealth: Payer: Self-pay | Admitting: *Deleted

## 2020-03-13 NOTE — Telephone Encounter (Signed)
Oncology Nurse Navigator Documentation  In follow-up to IB, called NP Cammie Sickle, provided contact information for [facility] per her request.    Received call this afternoon from Mr. Ake nephew Durrell.  Informed him:  Of pending [facility] appt   Ensure ordered by NP Smith Robert is available for pick-up at [facility] being sent to [place] for follow-up re weight loss per recent visit with NP Smith Robert  Confirmed understanding of follow-up appts currently scheduled in Epic.    He confirmed Mr. Yeagle currently has adequate supplies for trach care: approximately 10 cleaning kits (cleaning trach minimally every 2 days), 12 holding straps.  [name], RN, BSN Head & Neck Oncology Nurse [place] at [place] [phone]

## 2020-03-14 ENCOUNTER — Other Ambulatory Visit: Payer: Self-pay | Admitting: Acute Care

## 2020-03-14 DIAGNOSIS — Z930 Tracheostomy status: Secondary | ICD-10-CM

## 2020-03-15 ENCOUNTER — Telehealth: Payer: Self-pay

## 2020-03-15 NOTE — Telephone Encounter (Signed)
-----   Message from Dorena Dew, [place] sent at 03/14/2020  3:11 PM EDT ----- Regarding: Lurline Idol clinic Please schedule an appointment for Mr. Woodyard at the respiratory clinic [phone].  The scheduler's name is Joellen Jersey, and supposedly they hold clinic for trach care every Wednesday.  Also, the head and neck cancer navigator at the cancer center is following up with the patient and his family.  Please inform Mr. Franchot Mimes that I sent Ensure to patient's pharmacy.  Also, he will have an upcoming appointment with the dietitian being that he missed his last appointment.  The patient and I will follow up in primary care as scheduled.   Donia Pounds  APRN, MSN, FNP-C Patient [place] [address] [place],  [address] [phone]

## 2020-03-15 NOTE — Telephone Encounter (Signed)
Called trach clinic no answer. Left a message on voicemail for Katie to get patient scheduled. Will advised patient once appointment is made.

## 2020-03-16 NOTE — Telephone Encounter (Signed)
Called and spoke with Perry Waller. He is scheduled.  Called and spoke with patient's Sister. She was informed of appointment and has everything set up for patient. Informed that Ensure was sent to pharmacy. Thanks!

## 2020-03-21 ENCOUNTER — Inpatient Hospital Stay (HOSPITAL_COMMUNITY): Admission: RE | Admit: 2020-03-21 | Payer: Medicaid Other | Source: Ambulatory Visit

## 2020-04-09 ENCOUNTER — Other Ambulatory Visit (HOSPITAL_COMMUNITY)
Admission: RE | Admit: 2020-04-09 | Discharge: 2020-04-09 | Disposition: A | Discharge status: Home / Self Care | Payer: Medicaid Other | Source: Ambulatory Visit | Attending: Acute Care | Admitting: Acute Care

## 2020-04-09 DIAGNOSIS — Z01812 Encounter for preprocedural laboratory examination: Secondary | ICD-10-CM | POA: Insufficient documentation

## 2020-04-09 DIAGNOSIS — Z20822 Contact with and (suspected) exposure to covid-19: Secondary | ICD-10-CM | POA: Insufficient documentation

## 2020-04-09 LAB — SARS CORONAVIRUS 2 (TAT 6-24 HRS): SARS Coronavirus 2: NEGATIVE

## 2020-04-09 NOTE — Progress Notes (Signed)
Oncology Nurse Navigator Documentation  I called and spoke with Perry Waller sister Perry Waller. I spoke to her about Perry Waller appointments this week, today at 2:50 for a COVID test and 4/14 at the trach clinic for 1:00. She verbalized that she was aware of these appointments and plans to bring her brother as scheduled. She knows she is able to call me if she has any further questions or concerns.   Harlow Asa RN, BSN, OCN Head & Neck Oncology Nurse [place] at [facility] Phone # [phone]  Fax # [phone]

## 2020-04-11 ENCOUNTER — Ambulatory Visit (HOSPITAL_COMMUNITY)
Admission: RE | Admit: 2020-04-11 | Discharge: 2020-04-11 | Disposition: A | Discharge status: Home / Self Care | Payer: Medicaid Other | Source: Ambulatory Visit | Attending: Acute Care | Admitting: Acute Care

## 2020-04-11 ENCOUNTER — Other Ambulatory Visit: Payer: Self-pay

## 2020-04-11 DIAGNOSIS — J9503 Malfunction of tracheostomy stoma: Secondary | ICD-10-CM | POA: Diagnosis not present

## 2020-04-11 DIAGNOSIS — Z930 Tracheostomy status: Secondary | ICD-10-CM

## 2020-04-11 NOTE — Progress Notes (Signed)
[facility]   Reason for visit:  Trach care  HPI:  59 year old male w h/o head and neck cancer. He is s/p chemo, XRT and trach. I saw  him last back in Sept 2020, seems as though he had been lost to follow up during the COVID-19 pandemic as I see he had been followed by heme/onc but has had no visits w/ [place] since that time.  ->note from Dr Lanell Persons notes he had been changed to 4 cuffless after presenting w/ trach dislodgement in 12/28  ROS  Review of Systems - History obtained from the patient General ROS: positive for  - weight loss negative for - chills, fatigue, fever, hot flashes, malaise, night sweats or sleep disturbance Psychological ROS: negative ENT ROS: positive for - vocal changes negative for - epistaxis, headaches, nasal congestion, nasal discharge, sinus pain or sore throat Allergy and Immunology ROS: negative Respiratory ROS: positive for - shortness of breath and with exertion especially if PMV is in place  negative for - cough, pleuritic pain, sputum changes, stridor, tachypnea or wheezing Cardiovascular ROS: no chest pain or dyspnea on exertion Gastrointestinal ROS: positive for - appetite loss negative for - abdominal pain, blood in stools, change in bowel habits, gas/bloating or heartburn Musculoskeletal ROS: negative Neurological ROS: no TIA or stroke symptoms  Vital signs:  Reviewed  Exam:  Physical Exam Vitals reviewed.  Constitutional:      General: He is not in acute distress.    Appearance: He is not ill-appearing or toxic-appearing.  HENT:     Head: Normocephalic and atraumatic.     Nose: Nose normal.     Mouth/Throat:     Mouth: Mucous membranes are moist.  Eyes:     Conjunctiva/sclera: Conjunctivae normal.     Pupils: Pupils are equal, round, and reactive to light.  Neck:     Comments: Trach site unremarkable  Marked upper airway noises (very turbulent w/ insp and exp wheezing audible w stethoscope)  Cardiovascular:   Rate and Rhythm: Normal rate.  Pulmonary:     Effort: Pulmonary effort is normal.     Breath sounds: Normal breath sounds.     Comments: Phonation raspy but audible Does have sig upper airway noises particularly w/ PMV in place. Also has air rush after PMV removal suggesting trapped air and probably some tracheal stenosis at level of trach itself  Abdominal:     General: Abdomen is flat.     Palpations: Abdomen is soft.  Musculoskeletal:     Cervical back: Normal range of motion.  Skin:    General: Skin is warm.  Neurological:     General: No focal deficit present.     Mental Status: He is alert.     Trach change/procedure: # 4 cuffless trach changed at bedside. Tolerated well.       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 Per rad/onc note good response to radiotherapy. He is being followed for possible lung involvement. I do not think he is a candidate for decannulation  Plan  Cont routine trach care Follow up every 3 months for trach change Defer head/neck follow up to cancer and ENT team Encourage PMV as much as able. Seems to have some degree of obstruction when PMV in place so needs to ensure PMV removed at HS and probably will do better w/ PMV removed for exertion (I  reviewed this w/ he and his sister)  He will not be a candidate for decannulation   Visit time: 33  minutes.   [name] ACNP-BC [place]

## 2020-04-11 NOTE — Progress Notes (Signed)
Tracheostomy Procedure Note  Perry Waller [id number] Oct 20, 1961  Pre Procedure Tracheostomy Information  Trach Brand: Shiley Size: 4.0 Style: Uncuffed Secured by: Velcro   Procedure:  Trach change and trach cleaning    Post Procedure Tracheostomy Information  Trach Brand: Shiley Size: 4.0 Style: Uncuffed Secured by: Velcro   Post Procedure Evaluation:  ETCO2 positive color change from yellow to purple : Yes.   Vital signs: VSS Patients current condition: stable Complications: No apparent complications Trach site exam: slightly crusted  Wound care done: Other:  Cleaned Patient did tolerate procedure well.   Education: Lurline Idol changing and PMV instructions  Prescription needs: none    Additional needs: none

## 2020-04-12 DIAGNOSIS — Z930 Tracheostomy status: Secondary | ICD-10-CM | POA: Insufficient documentation

## 2020-04-12 DIAGNOSIS — J9503 Malfunction of tracheostomy stoma: Secondary | ICD-10-CM | POA: Insufficient documentation

## 2020-04-24 ENCOUNTER — Ambulatory Visit: Payer: Medicaid Other | Admitting: Family Medicine

## 2020-04-27 IMAGING — CR CHEST - 2 VIEW
2 series · 2 of 2 positions shown · non-contrast
Comparison: Chest radiograph dated 08/05/2019

CLINICAL DATA: 58-year-old male with cough.

EXAM:
CHEST - 2 VIEW

[w chest pa]
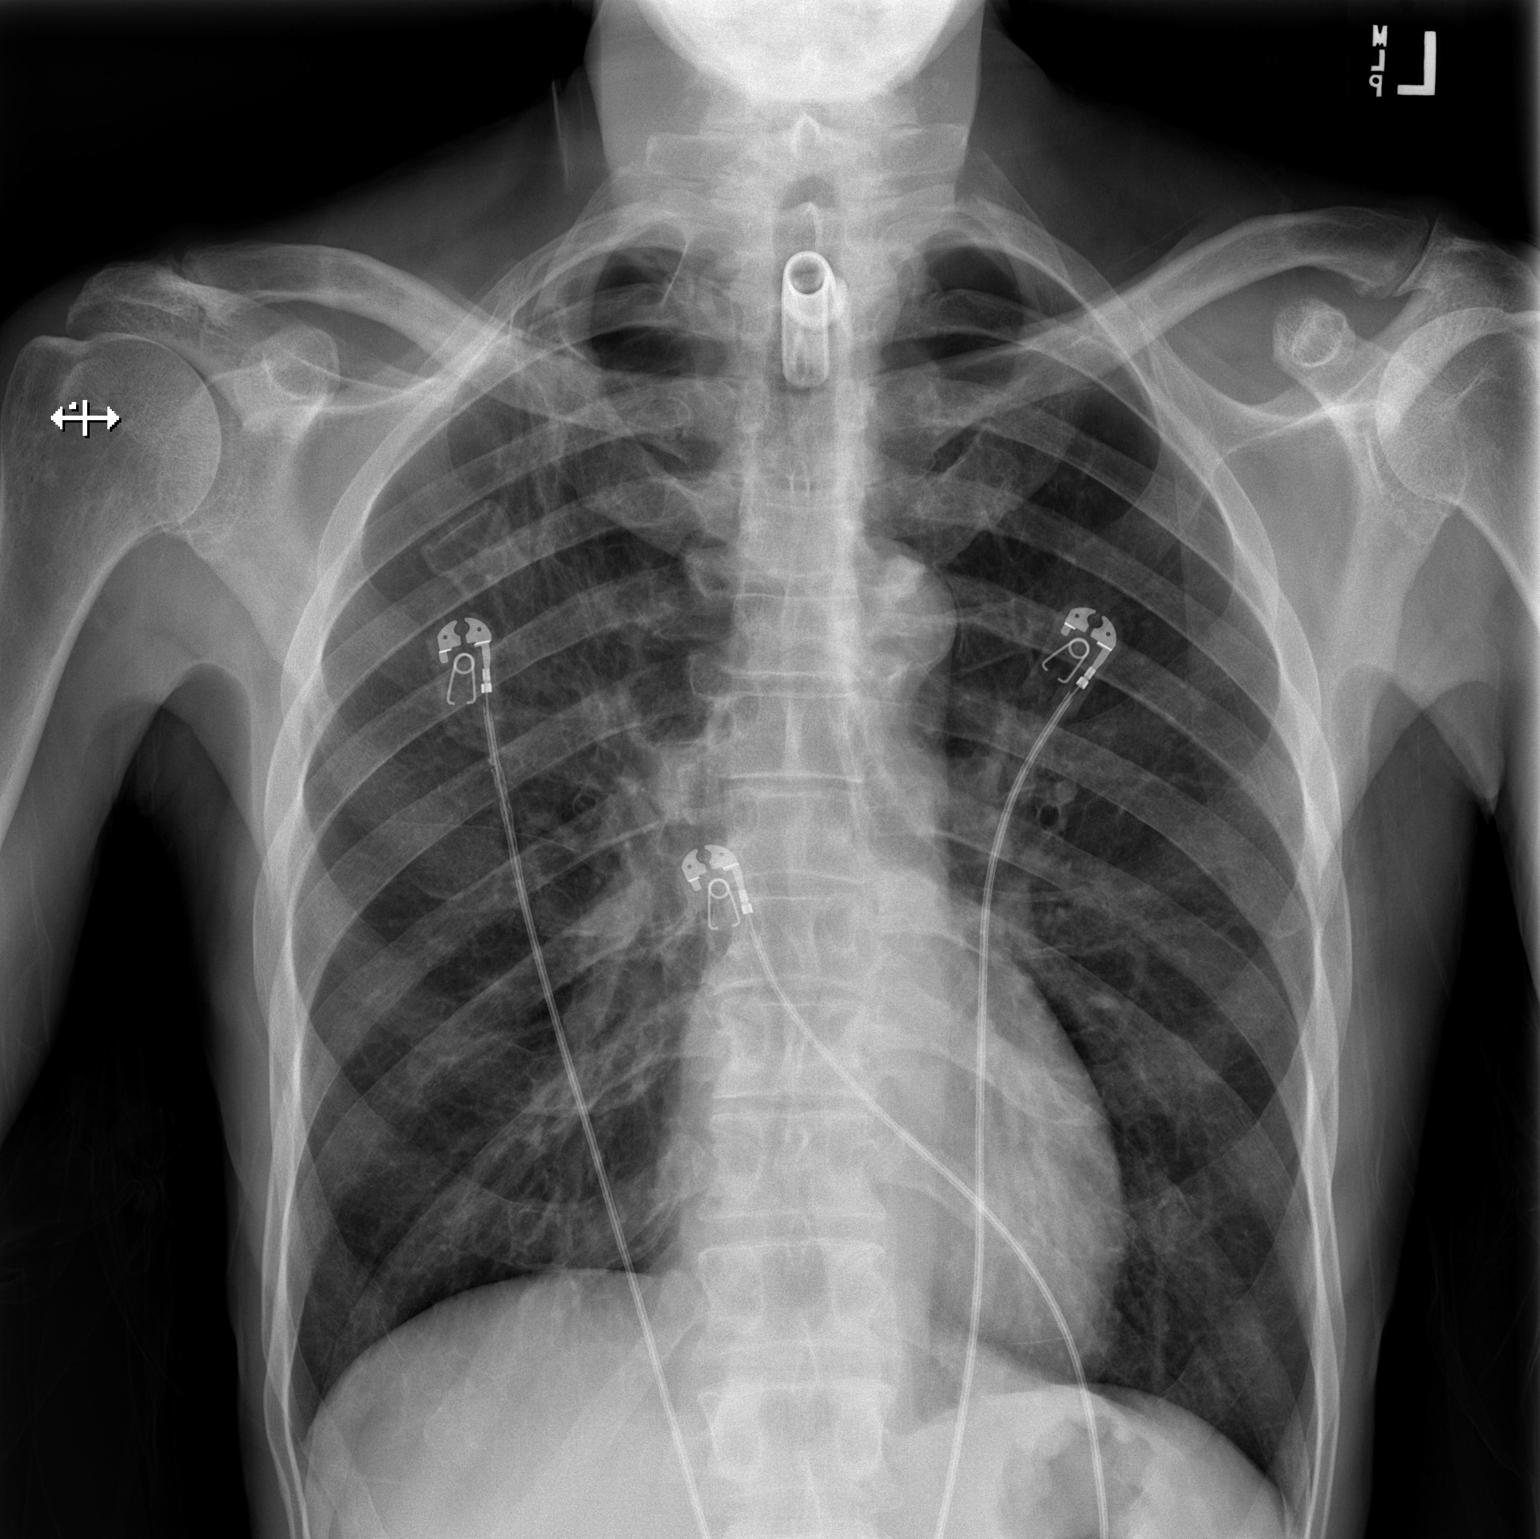

[w chest lat]
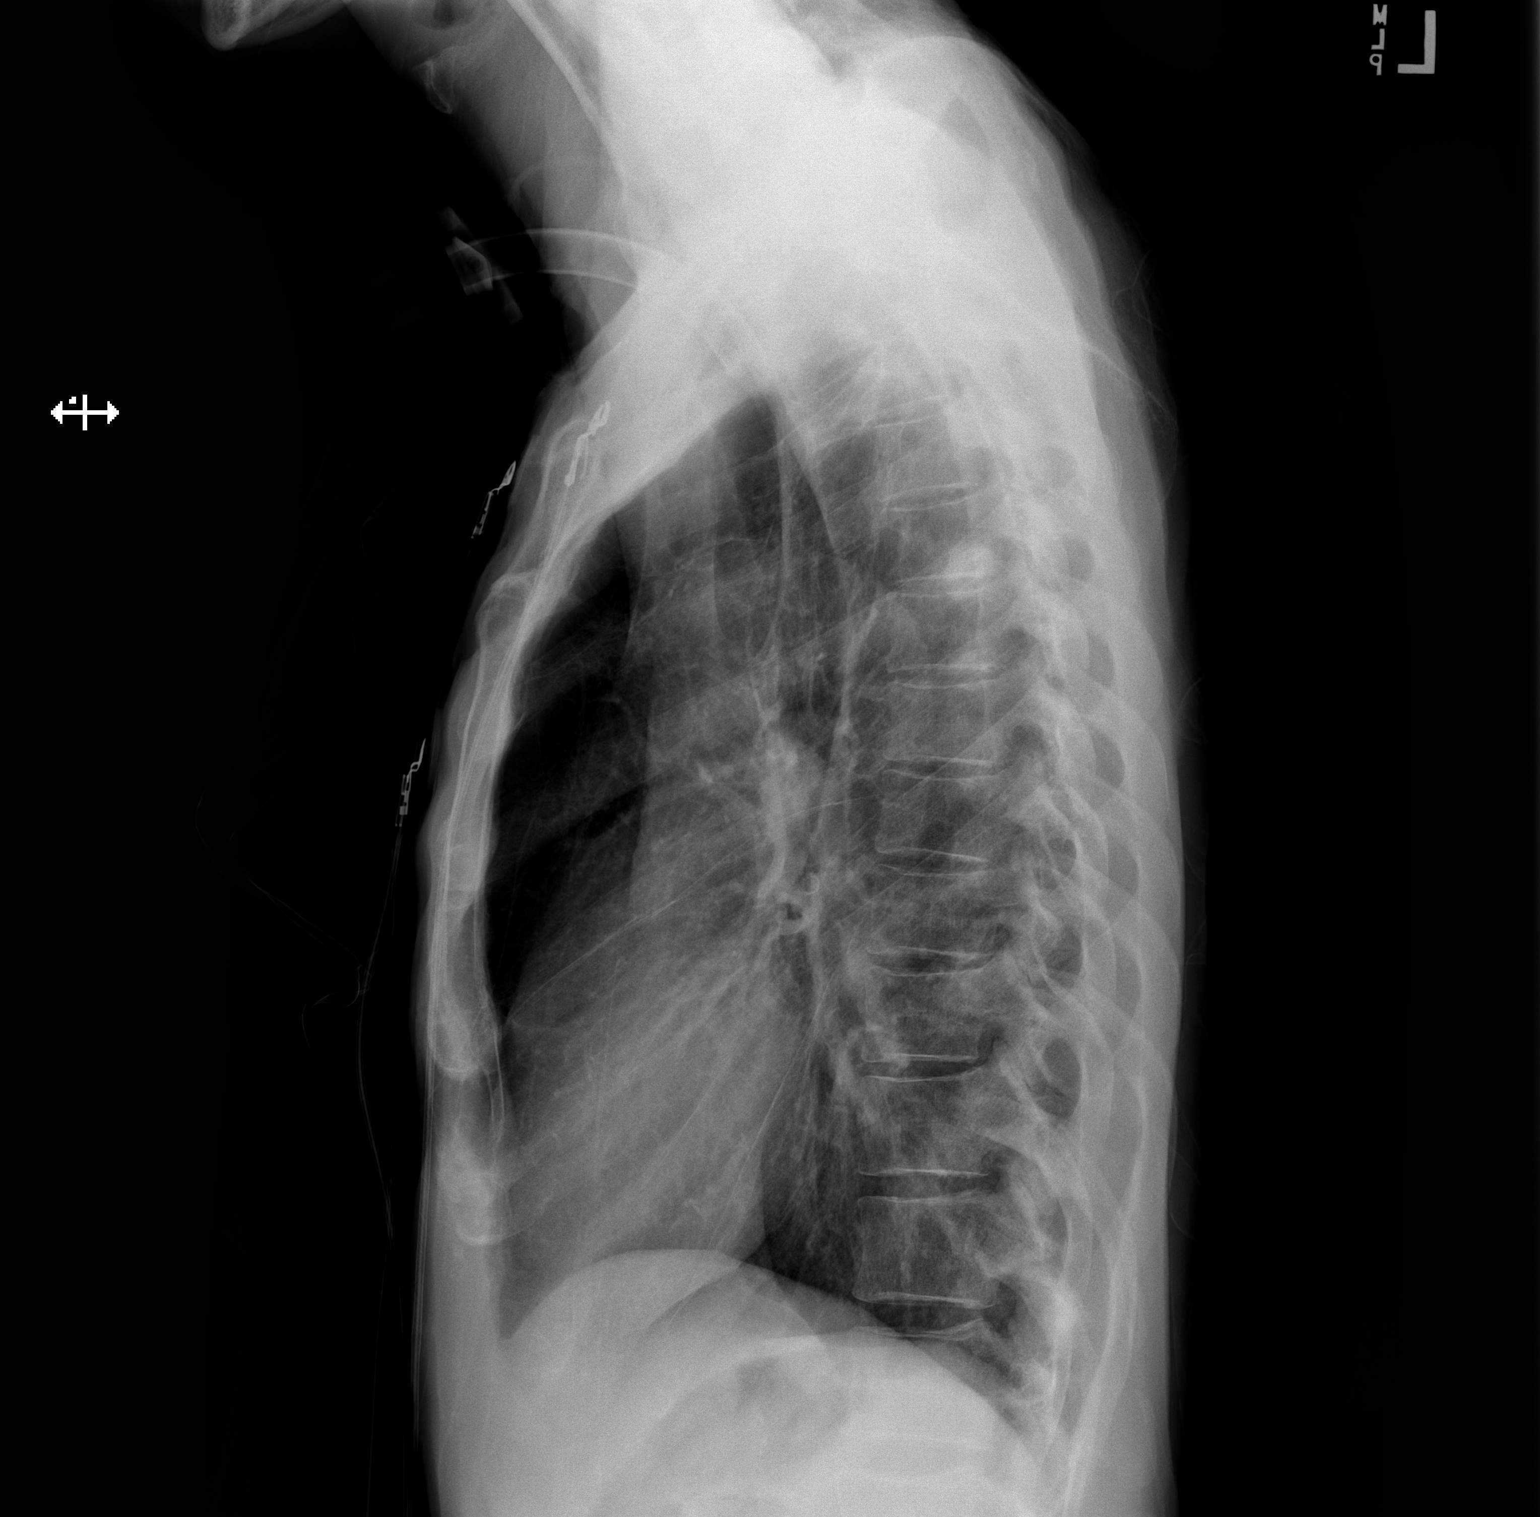

[2 of 2 positions shown; findings below may reference images not displayed]

FINDINGS: Emphysema with hyperexpansion and biapical subpleural blebs and
right upper lobe scarring. No focal consolidation, pleural effusion,
or pneumothorax. Tracheostomy noted. The cardiac silhouette is
within normal limits. No acute osseous pathology.
IMPRESSION: No active cardiopulmonary disease.

## 2020-04-30 ENCOUNTER — Telehealth: Payer: Self-pay | Admitting: *Deleted

## 2020-04-30 NOTE — Telephone Encounter (Signed)
CALLED PATIENT'S SISTER- HENRIETTE NICKELSON TO REMIND OF LAB AND FU FOR Perry Waller  ON 05-01-20  SPOKE WITH MS. NICKELSON AND SHE IS AWARE OF THESE APPTS.

## 2020-04-30 NOTE — Telephone Encounter (Signed)
Called patient to remind of lab for 05-01-20, no answer, unable to leave vm due to being full

## 2020-04-30 NOTE — Telephone Encounter (Signed)
XXX

## 2020-04-30 NOTE — Progress Notes (Signed)
Perry Waller presents for follow up of radiation to his larynx completed on 10/10/2019.  Pain issues, if any: Intermittent 5/10 pain under jaw/upper neck that occurs randomly Using a feeding tube?: Occasionally. When he does use he denies any issues. Weight changes, if any:  Wt Readings from Last 3 Encounters:  05/01/20 84 lb 9.6 oz (38.4 kg)  03/06/20 94 lb (42.6 kg)  01/18/20 98 lb 6 oz (44.6 kg)   Swallowing issues, if any: None. Didn't have much of an appetite a couple of months ago but reports it has improved. Smoking or chewing tobacco? Daily cigarette use. About 5 a day Using fluoride trays daily? N/A Last ENT visit was on: No new update since last visit with Dr. Melissa Montane on 01/11/2020 Other notable issues, if any:  Patient complains of a persistent productive cough. Sputum ranges from white to yellow, and is thick in consistency. 04/11/2020--Saw Holli Humbles at trach clinic: I do not think he is a candidate for decannulation. Cont routine trach care. Follow up every 3 months for trach change Defer head/neck follow up to cancer and ENT team Encourage PMV as much as able. Seems to have some degree of obstruction when PMV in place so needs to ensure PMV removed at [facility] and probably will do better w/ PMV removed for exertion (I reviewed this w/ he and his sister)  Vitals:   05/01/20 1355  BP: 90/70  Pulse: 96  Resp: 20  Temp: 98.2 F (36.8 C)  SpO2: 96%  Weight: 84 lb 9.6 oz (38.4 kg)  Height: 5\' 8"  (1.727 m)

## 2020-05-01 ENCOUNTER — Other Ambulatory Visit: Payer: Self-pay

## 2020-05-01 ENCOUNTER — Encounter: Payer: Self-pay | Admitting: Radiation Oncology

## 2020-05-01 ENCOUNTER — Ambulatory Visit
Admission: RE | Admit: 2020-05-01 | Discharge: 2020-05-01 | Disposition: A | Discharge status: Home / Self Care | Payer: Medicaid Other | Source: Ambulatory Visit | Attending: Radiation Oncology | Admitting: Radiation Oncology

## 2020-05-01 VITALS — BP 90/70 | HR 96 | Temp 98.2°F | Resp 20 | Ht 68.0 in | Wt 84.6 lb

## 2020-05-01 DIAGNOSIS — F17210 Nicotine dependence, cigarettes, uncomplicated: Secondary | ICD-10-CM | POA: Insufficient documentation

## 2020-05-01 DIAGNOSIS — R5383 Other fatigue: Secondary | ICD-10-CM | POA: Diagnosis not present

## 2020-05-01 DIAGNOSIS — R634 Abnormal weight loss: Secondary | ICD-10-CM | POA: Insufficient documentation

## 2020-05-01 DIAGNOSIS — R64 Cachexia: Secondary | ICD-10-CM | POA: Insufficient documentation

## 2020-05-01 DIAGNOSIS — R05 Cough: Secondary | ICD-10-CM | POA: Diagnosis not present

## 2020-05-01 DIAGNOSIS — J439 Emphysema, unspecified: Secondary | ICD-10-CM | POA: Insufficient documentation

## 2020-05-01 DIAGNOSIS — Z923 Personal history of irradiation: Secondary | ICD-10-CM | POA: Insufficient documentation

## 2020-05-01 DIAGNOSIS — Z8589 Personal history of malignant neoplasm of other organs and systems: Secondary | ICD-10-CM | POA: Diagnosis present

## 2020-05-01 DIAGNOSIS — I700 Atherosclerosis of aorta: Secondary | ICD-10-CM | POA: Insufficient documentation

## 2020-05-01 DIAGNOSIS — R059 Cough, unspecified: Secondary | ICD-10-CM

## 2020-05-01 DIAGNOSIS — C321 Malignant neoplasm of supraglottis: Secondary | ICD-10-CM

## 2020-05-01 DIAGNOSIS — Z79899 Other long term (current) drug therapy: Secondary | ICD-10-CM | POA: Insufficient documentation

## 2020-05-01 LAB — TSH: TSH: 2.798 u[IU]/mL (ref 0.320–4.118)

## 2020-05-01 NOTE — Progress Notes (Signed)
Radiation Oncology         [phone]) [phone] ________________________________  Name: Perry Waller MRN: [id number]  Date: 05/01/2020  DOB: 1961/03/04  Follow-Up Visit Note in person  CC: Perry Garter, MD  Perry Garter, MD  Diagnosis and Prior Radiotherapy:         ICD-10-CM   1. 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 Cough  R05 CT Chest W Contrast   08/17/2019 through 10/10/2019 Site Technique Total Dose (Gy) Dose per Fx (Gy) Completed Fx Beam Energies  Head & neck: HN_larynx IMRT 70/70 2 35/35 6X    CHIEF COMPLAINT:  Here for follow-up and surveillance of laryngeal cancer  Narrative:  The patient returns today for routine follow-up of radiation completed 10/10/19 to his head and neck area.     Pain issues, if any: Intermittent 5/10 pain under jaw/upper neck that occurs randomly Using a feeding tube?: Occasionally. When he does use he denies any issues. Weight changes, if any:  Wt Readings from Last 3 Encounters:  05/01/20 84 lb 9.6 oz (38.4 kg)  03/06/20 94 lb (42.6 kg)  01/18/20 98 lb 6 oz (44.6 kg)   Swallowing issues, if any: None. Didn't have much of an appetite a couple of months ago but reports it has improved. Smoking or chewing tobacco? Daily cigarette use. About 5 a day Using fluoride trays daily? N/A Last ENT visit was on: No new update since last visit with Perry Waller on 01/11/2020 Other notable issues, if any:  Patient complains of a persistent productive cough. Sputum ranges from white to yellow, and is thick in consistency. 04/11/2020--Saw Perry Waller at trach clinic: I do not think he is a candidate for decannulation. Cont routine trach care. Follow up every 3 months for trach change Defer head/neck follow up to cancer and ENT team Encourage PMV as much as able. Seems to have some degree of obstruction when PMV in place so needs to ensure PMV removed  at HS and probably will do better w/ PMV removed for exertion (I reviewed this w/ he and his sister)    ALLERGIES:  is allergic to fish allergy.  Meds: Current Outpatient Medications  Medication Sig Dispense Refill  . benzonatate (TESSALON) 200 MG capsule Take 1 capsule (200 mg total) by mouth 3 (three) times daily as needed for cough. 90 capsule 5  . feeding supplement, ENSURE COMPLETE, (ENSURE COMPLETE) LIQD Take 237 mLs by mouth 3 (three) times daily between meals. 21330 mL 0  . pentoxifylline (TRENTAL) 400 MG CR tablet Take 400mg  tab daily x 1 week, then 400mg  BID; take with food 60 tablet 5  . Tiotropium Bromide-Olodaterol (STIOLTO RESPIMAT) 2.5-2.5 MCG/ACT AERS Inhale 2 puffs into the lungs daily. 4 g 6  . Tiotropium Bromide-Olodaterol (STIOLTO RESPIMAT) 2.5-2.5 MCG/ACT AERS Inhale 2 puffs into the lungs daily. 4 g 0  . vitamin E 180 MG (400 UNITS) capsule Take 400 IU daily x 1 week, then 400 IU BID (Patient not taking: Reported on 03/06/2020) 60 capsule 5   No current facility-administered medications for this encounter.    Physical Findings: The patient is in no acute distress. Patient is alert and oriented. Wt Readings from Last 3 Encounters:  05/01/20 84 lb 9.6 oz (38.4 kg)  03/06/20 94 lb (42.6 kg)  01/18/20 98 lb 6 oz (44.6 kg)    height is  5\' 8"  (1.727 m) and weight is 84 lb 9.6 oz (38.4 kg). His temperature is 98.2 F (36.8 C). His blood pressure is 90/70 and his pulse is 96. His respiration is 20 and oxygen saturation is 96%. .  General: Alert and oriented, in no acute distress, in a WC;  HEENT: Temporal wasting. Extraocular movements are intact.  No obvious thrush in the oral cavity or throat. No tumor visible Neck: Neck is notable for trach, clean and intact.  No palpable masses Skin: Skin in treatment fields shows satisfactory healing - intact Lungs: Coarse breath sounds transmitted from upper airway Abdomen: PEG site clean and intact  Lab Findings: Lab Results    Component Value Date   WBC 8.3 08/15/2019   HGB 10.6 (L) 08/15/2019   HCT 33.7 (L) 08/15/2019   MCV 97.1 08/15/2019   PLT 255 08/15/2019    Lab Results  Component Value Date   TSH 2.798 05/01/2020    Radiographic Findings: No results found.  Impression/Plan:    1) Head and Neck Cancer Status: NED but losing weight  He is emaciated - I am going to order CT scan to rule out recurrence; his appetite is poor and he is not eating or using his PEG tube enough.  Patient speech is hard to understand. I called his sister and she said that he is not following through with recommendations.  He is provided plenty of Ensure but he gets frustrated because he cannot eat what he wants to.  He has not followed through with swallowing therapy and she feels that it is now a waste of time to resume any appointments with Perry Waller due to noncompliance w/ speech and swallow tx.  Today I asked social work to check in with his sister and determine if there is anything else that can be done.  Are there other barriers that are making it hard for him to eat?  Financial?  Would home health nursing visits be feasible and helpful?  2) Risk Factors: The patient has been educated about risk factors including alcohol and tobacco abuse; they understand that avoidance of alcohol and tobacco is important to prevent recurrences as well as other cancers  -- still smoking   3) Swallowing: as above   4) Dental: Suboptimal dentition, advised to follow oral /dental care   5) Thyroid function: check TSH annually WNL Lab Results  Component Value Date   TSH 2.798 05/01/2020   6) We discussed measures to reduce the risk of infection during the COVID-19 pandemic.  He would like to get his vaccine.  I will try to help his sister schedule this when she returns to town.  She will be out of town until next week so she cannot take him to an appointment before May 10. I've set a reminder for myself in Epic to call her  again.  7) I will call him with the results to his CT scans.  I will see him back in 6 months.  We will arrange follow-up with otolaryngology in 3 months.   On date of encounter, in total, I spent 45 minutes on this encounter. _____________________________________   Eppie Gibson, MD

## 2020-05-02 ENCOUNTER — Telehealth: Payer: Self-pay | Admitting: General Practice

## 2020-05-02 NOTE — Telephone Encounter (Signed)
[place] CSW Progress Notes  Called sister at request of Dr Isidore Moos, concerned about his weight/access to food.  Unable to reach or leave VM, will call after 3 PM as note states sister works til then.    Edwyna Shell, LCSW Clinical Social Worker Phone:  [phone]

## 2020-05-02 NOTE — Progress Notes (Signed)
Oncology Nurse Navigator Documentation  Per patient's 05/01/20 post-treatment follow-up with Dr. Isidore Moos, sent fax to [facility] ENT Scheduling with request that [name] be contacted and scheduled for routine post-RT follow-up. Notification of successful fax transmission received.   Harlow Asa RN, BSN, OCN Head & Neck Oncology Nurse [place] at [facility] Phone # [phone]  Fax # [phone]

## 2020-05-03 ENCOUNTER — Telehealth: Payer: Self-pay | Admitting: Radiation Oncology

## 2020-05-03 NOTE — Telephone Encounter (Signed)
Spoke with pt's sister Henri. Pt is aware of appt on 5/12 per 5/5 sch msg.

## 2020-05-07 NOTE — Progress Notes (Signed)
Oncology Nurse Navigator Documentation  I called and spoke with patients sister, Henriette. I informed her of an appointment for Mr. Danielski with Dr. Constance Holster scheduled for 08/07/20 at 10:10 am. She wrote the appointment down and verbalized that they will be there.   Harlow Asa RN, BSN, OCN Head & Neck Oncology Nurse [place] at [facility] Phone # [phone]  Fax # [phone]

## 2020-05-08 ENCOUNTER — Encounter: Payer: Self-pay | Admitting: *Deleted

## 2020-05-08 NOTE — Progress Notes (Signed)
[place] Work  Clinical Social Work received referral from radiation oncology.  CSW contacted patients sister at home to offer support and assess for needs.  Patients sister stated patient is refusing to use his feeding tube, "will not let me flush it", "says he wants it out".  Patient sister stated she has the home stocked with food, water, everything he needs.  CSW explored options for additional help in the home through [facility] services.  Patient sister stated patient is able to feed, dress, and bathe himself.  Patients sister stated she has provided "everything he needs and encouraged him to use his feeding tube, but she cannot force him."  Patient is planning to see [place] tomorrow.  CSW encouraged patients sister to call with any questions or concerns.    Johnnye Lana, MSW, LCSW, OSW-C Clinical Social Worker [facility] [phone]

## 2020-05-09 ENCOUNTER — Other Ambulatory Visit: Payer: Self-pay

## 2020-05-09 ENCOUNTER — Telehealth: Payer: Self-pay | Admitting: *Deleted

## 2020-05-09 ENCOUNTER — Inpatient Hospital Stay: Payer: Medicaid Other | Attending: Radiation Oncology | Admitting: Nutrition

## 2020-05-09 DIAGNOSIS — C321 Malignant neoplasm of supraglottis: Secondary | ICD-10-CM

## 2020-05-09 DIAGNOSIS — E43 Unspecified severe protein-calorie malnutrition: Secondary | ICD-10-CM | POA: Insufficient documentation

## 2020-05-09 MED ORDER — OSMOLITE 1.5 CAL PO LIQD: ORAL | 0 refills | Status: DC

## 2020-05-09 NOTE — Progress Notes (Signed)
Patient presented for nutrition follow-up by himself. Patient completed radiation therapy for supraglottis cancer in October 2020. He has both a feeding tube and a trach. Weight documented as 84.6 pounds on May 4, decreased from 108.5 pounds December 8.  This is a 22% weight loss over 6 months which is significant. Patient is emaciated and cachectic in appearance. He meets guidelines for severe malnutrition in the context of chronic illness. Reports his appetite has improved in the last 2 weeks. States he continues to use Osmolite 1.5 via pump 3 nights per week. Reports he has plenty of Osmolite 1.5 available. Reports he has run out of Ensure [place] and is agreeable to taking some additional samples. Reports he has been eating chicken, rice, potatoes, and bread.  He drinks tea, Kool-Aid, coffee, and 2 cups of water daily. Reports his tube has been clogged.  He agrees to allow me to look at it today. Denies problems with diarrhea or constipation.  No problems chewing or swallowing per patient.  Nutrition diagnosis: Inadequate oral intake continues. Severe malnutrition in the context of chronic illness secondary to weight loss and loss of muscle mass.  Current BMI is 12.86, severely underweight.  Intervention: Patient agrees to begin using his feeding tube more often.  We discussed several options for tube feeding and patient agrees to the following. Resume continuous tube feeding via G-tube using Osmolite 1.5 at 65 mL an hour from 10 PM until 9 AM (daily).  Tube should be flushed with 180 mL of water before and after continuous feedings.  This is approximately 3 cartons of Osmolite 1.5.  In addition patient will use 1 carton Osmolite 1.5 with 60 mL of free water before and after each bolus feeding twice daily around 10 AM and 4 PM.  Patient is agreeable.  Instructions were written out. Encouraged patient to continue soft foods as tolerated along with Ensure Enlive throughout the day.  Provided 1  complementary case of Ensure Enlive.  Provided additional syringes. Questions were answered and teach back method used. New tube feeding orders written and [employer] [facility] notified.  5 cartons Osmolite 1.5 provides 1775 cal, 74.5 g protein, 1505 mL free water.  Monitoring, evaluation, goals: Patient will tolerate tube feeding and oral intake to promote weight gain. Recommend labs 2 times weekly to evaluate refeeding secondary to increased risk.  Next visit:

## 2020-05-09 NOTE — Telephone Encounter (Signed)
CALLED PATIENT TO INFORM OF LAB FOR 05-10-20 @ 12:15 PM, SPOKE WITH SISTER- [name] AND SHE IS AWARE OF THIS APPT.

## 2020-05-10 ENCOUNTER — Other Ambulatory Visit: Payer: Self-pay

## 2020-05-10 ENCOUNTER — Encounter (HOSPITAL_COMMUNITY): Payer: Self-pay

## 2020-05-10 ENCOUNTER — Ambulatory Visit (HOSPITAL_COMMUNITY)
Admission: RE | Admit: 2020-05-10 | Discharge: 2020-05-10 | Disposition: A | Discharge status: Home / Self Care | Payer: Medicaid Other | Source: Ambulatory Visit | Attending: Radiation Oncology | Admitting: Radiation Oncology

## 2020-05-10 ENCOUNTER — Ambulatory Visit: Payer: Medicaid Other

## 2020-05-10 ENCOUNTER — Ambulatory Visit
Admission: RE | Admit: 2020-05-10 | Discharge: 2020-05-10 | Disposition: A | Discharge status: Home / Self Care | Payer: Medicaid Other | Source: Ambulatory Visit | Attending: Radiation Oncology | Admitting: Radiation Oncology

## 2020-05-10 DIAGNOSIS — R05 Cough: Secondary | ICD-10-CM | POA: Insufficient documentation

## 2020-05-10 DIAGNOSIS — C321 Malignant neoplasm of supraglottis: Secondary | ICD-10-CM

## 2020-05-10 DIAGNOSIS — R059 Cough, unspecified: Secondary | ICD-10-CM

## 2020-05-10 LAB — COMPREHENSIVE METABOLIC PANEL
ALT: 9 U/L (ref 0–44)
AST: 15 U/L (ref 15–41)
Albumin: 2.6 g/dL — ABNORMAL LOW (ref 3.5–5.0)
Alkaline Phosphatase: 76 U/L (ref 38–126)
Anion gap: 9 (ref 5–15)
BUN: 9 mg/dL (ref 6–20)
CO2: 30 mmol/L (ref 22–32)
Calcium: 8.8 mg/dL — ABNORMAL LOW (ref 8.9–10.3)
Chloride: 100 mmol/L (ref 98–111)
Creatinine, Ser: 0.61 mg/dL (ref 0.61–1.24)
GFR calc Af Amer: >60 mL/min (ref >60)
GFR calc non Af Amer: >60 mL/min (ref >60)
Glucose, Bld: 119 mg/dL — ABNORMAL HIGH (ref 70–99)
Potassium: 4 mmol/L (ref 3.5–5.1)
Sodium: 139 mmol/L (ref 135–145)
Total Bilirubin: 0.6 mg/dL (ref 0.3–1.2)
Total Protein: 7 g/dL (ref 6.5–8.1)

## 2020-05-10 MED ORDER — IOHEXOL 300 MG/ML  SOLN
75.0000 mL | Freq: Once | INTRAMUSCULAR | Status: AC | PRN
  Administered 2020-05-10: 75 mL via INTRAVENOUS

## 2020-05-10 MED ORDER — SODIUM CHLORIDE (PF) 0.9 % IJ SOLN
INTRAMUSCULAR | Status: AC
  Filled 2020-05-10: qty 50

## 2020-05-11 ENCOUNTER — Encounter: Payer: Self-pay | Admitting: Radiation Oncology

## 2020-05-11 NOTE — Progress Notes (Signed)
I called the patient's sister to discuss his scan results and follow-up on helping her schedule his Covid vaccination.  Her voicemail box was full.  I will try again on Monday May 17th.  -----------------------------------  Eppie Gibson, MD

## 2020-05-15 ENCOUNTER — Other Ambulatory Visit: Payer: Self-pay | Admitting: Radiation Oncology

## 2020-05-15 DIAGNOSIS — C321 Malignant neoplasm of supraglottis: Secondary | ICD-10-CM

## 2020-05-15 MED ORDER — AMOXICILLIN-POT CLAVULANATE 250-62.5 MG/5ML PO SUSR: 875.0000 mg | Freq: Two times a day (BID) | ORAL | 0 refills | Status: DC

## 2020-05-15 MED ORDER — AZITHROMYCIN 200 MG/5ML PO SUSR: ORAL | 0 refills | Status: DC

## 2020-05-15 NOTE — Progress Notes (Signed)
Today I spoke with the patient and his niece by phone.  We reviewed the results to his CT scans.  There is no evidence of cancer, but he likely has aspiration pneumonia.  Encouraged him to use his tube for nutrition as I imagine he is aspirating.  We discussed starting him on antibiotics.  These prescriptions have been made to his local [facility].  We also once again discussed him getting a Covid vaccine.  We got him scheduled for an appointment at the [facility] this Friday.    Tomorrow, he will see our nutritionist.  All questions were answered and the patient and his sister pleased with this plan.  I will order a CT scan in 3 months to rescan his lungs and see him back for follow-up at that time.  ORDERS:   ICD-10-CM   1. Malignant neoplasm of supraglottis ([facility])  C32.1 CT Chest W Contrast    amoxicillin-clavulanate (AUGMENTIN) 250-62.5 MG/5ML suspension    azithromycin (ZITHROMAX) 200 MG/5ML suspension    -----------------------------------  Eppie Gibson, MD

## 2020-05-16 ENCOUNTER — Ambulatory Visit: Admission: RE | Admit: 2020-05-16 | Payer: Medicaid Other | Source: Ambulatory Visit | Admitting: Radiation Oncology

## 2020-05-16 ENCOUNTER — Inpatient Hospital Stay: Payer: Medicaid Other | Admitting: Nutrition

## 2020-05-16 ENCOUNTER — Ambulatory Visit
Admission: RE | Admit: 2020-05-16 | Discharge: 2020-05-16 | Disposition: A | Discharge status: Home / Self Care | Payer: Medicaid Other | Source: Ambulatory Visit | Attending: Radiation Oncology | Admitting: Radiation Oncology

## 2020-05-16 ENCOUNTER — Other Ambulatory Visit: Payer: Self-pay

## 2020-05-16 DIAGNOSIS — Z8589 Personal history of malignant neoplasm of other organs and systems: Secondary | ICD-10-CM | POA: Diagnosis not present

## 2020-05-16 DIAGNOSIS — E43 Unspecified severe protein-calorie malnutrition: Secondary | ICD-10-CM

## 2020-05-16 LAB — CMP (CANCER CENTER ONLY)
ALT: 15 U/L (ref 0–44)
AST: 20 U/L (ref 15–41)
Albumin: 3.1 g/dL — ABNORMAL LOW (ref 3.5–5.0)
Alkaline Phosphatase: 82 U/L (ref 38–126)
Anion gap: 8 (ref 5–15)
BUN: 16 mg/dL (ref 6–20)
CO2: 32 mmol/L (ref 22–32)
Calcium: 9.5 mg/dL (ref 8.9–10.3)
Chloride: 102 mmol/L (ref 98–111)
Creatinine: 0.7 mg/dL (ref 0.61–1.24)
GFR, Est AFR Am: >60 mL/min (ref >60)
GFR, Estimated: >60 mL/min (ref >60)
Glucose, Bld: 90 mg/dL (ref 70–99)
Potassium: 5 mmol/L (ref 3.5–5.1)
Sodium: 142 mmol/L (ref 135–145)
Total Bilirubin: 0.5 mg/dL (ref 0.3–1.2)
Total Protein: 7.7 g/dL (ref 6.5–8.1)

## 2020-05-16 LAB — MAGNESIUM: Magnesium: 1.9 mg/dL (ref 1.7–2.4)

## 2020-05-16 LAB — PHOSPHORUS: Phosphorus: 4.5 mg/dL (ref 2.5–4.6)

## 2020-05-16 NOTE — Progress Notes (Signed)
Nutrition Follow-up: Patient presented for nutrition follow-up by himself today and has completed his radiation therapy for supraglottitis cancer in October 2020. Patient has both a feeding tube and tach. Patient reports that his appetite continues to improve, reports having a bowl of cereal with whole milk and 2 Ensure before presenting for appointment today. He reports resuming tube feedings yesterday, states that his niece and nephew are dedicated to coming over daily to initiate continuous feedings. Patient reports liking the Ensure supplements, he drinks 3 daily and reports that he had 3 bottles left this morning. RD provided patient with chocolate Ensure during follow-up, pt consumed without  swallowing difficulty. He continues to smoke 3-4 cigarettes daily, reports that he quit drinking 3 months ago. Patient reports that he is enjoying spending time with his 2 grandchildren, feels grateful for family support, and motivated to take care of himself.   Medications: Augmentin, Zithromax, Trental, Vit E  Labs: Albumin 3.1 trending up from 5/12, Mg 1.9 (WNL), P 4.5 (WNL)  Anthropometrics:   Weighed pt today in office (wearing a light wt jacket) 92.4 lb increased from 84.6 lb on 5/4  Estimated Energy Needs  Kcals: 1560-1800 Protein: 78-90 grams Fluid: >/= 1.8 L  NUTRITION DIAGNOSIS: Inadequate oral intake improving   MALNUTRITION DIAGNOSIS: Severe malnutrition in the context of chronic illness continues   INTERVENTION: Patient agrees to continue with 4 cans of Osmolite 1.5 daily, encouraged to continue drinking nutrition supplements and soft foods as tolerated. Patient provided 1 complimentary case of Ensure Enlive.    MONITORING, EVALUATION, GOAL: Continue to monitor labs to evaluate refeeding secondary to increased risk. Patient will tolerate tube feeding and oral intake to promote weight gain.   NEXT VISIT: Wednesday May 26  Lajuan Lines, RD, LDN Clinical Nutrition After  Hours/Weekend Pager # in [facility]

## 2020-05-18 ENCOUNTER — Ambulatory Visit: Payer: Medicaid Other

## 2020-05-23 ENCOUNTER — Ambulatory Visit: Payer: Medicaid Other

## 2020-05-23 ENCOUNTER — Inpatient Hospital Stay: Payer: Medicaid Other | Admitting: Nutrition

## 2020-05-23 ENCOUNTER — Encounter: Payer: Self-pay | Admitting: Dietician

## 2020-05-23 NOTE — Progress Notes (Signed)
Nutrition  Patient scheduled for nutrition follow-up in office and labs today. Patient did not show for appointment. Attempted to contact patient via phone to complete follow-up, however patient did not answer. Voicemail box was full, unable to leave message with call back number. Patient is scheduled for nutrition follow-up and labs on 05/30/20.  Lajuan Lines, RD, LDN Clinical Nutrition After Hours/Weekend Pager # in [phone]

## 2020-05-30 ENCOUNTER — Inpatient Hospital Stay: Payer: Medicaid Other | Attending: Radiation Oncology | Admitting: Nutrition

## 2020-05-30 ENCOUNTER — Encounter: Payer: Self-pay | Admitting: Nutrition

## 2020-05-30 ENCOUNTER — Ambulatory Visit: Payer: Medicaid Other

## 2020-05-30 DIAGNOSIS — R634 Abnormal weight loss: Secondary | ICD-10-CM | POA: Insufficient documentation

## 2020-05-30 DIAGNOSIS — Z79899 Other long term (current) drug therapy: Secondary | ICD-10-CM | POA: Insufficient documentation

## 2020-05-30 DIAGNOSIS — Z8589 Personal history of malignant neoplasm of other organs and systems: Secondary | ICD-10-CM | POA: Insufficient documentation

## 2020-05-30 DIAGNOSIS — R64 Cachexia: Secondary | ICD-10-CM | POA: Insufficient documentation

## 2020-05-30 DIAGNOSIS — J439 Emphysema, unspecified: Secondary | ICD-10-CM | POA: Insufficient documentation

## 2020-05-30 DIAGNOSIS — E43 Unspecified severe protein-calorie malnutrition: Secondary | ICD-10-CM | POA: Insufficient documentation

## 2020-05-30 DIAGNOSIS — Z923 Personal history of irradiation: Secondary | ICD-10-CM | POA: Insufficient documentation

## 2020-05-30 DIAGNOSIS — I700 Atherosclerosis of aorta: Secondary | ICD-10-CM | POA: Insufficient documentation

## 2020-05-30 DIAGNOSIS — R5383 Other fatigue: Secondary | ICD-10-CM | POA: Insufficient documentation

## 2020-05-30 DIAGNOSIS — F17210 Nicotine dependence, cigarettes, uncomplicated: Secondary | ICD-10-CM | POA: Insufficient documentation

## 2020-05-30 DIAGNOSIS — R05 Cough: Secondary | ICD-10-CM | POA: Insufficient documentation

## 2020-05-30 NOTE — Progress Notes (Signed)
Patient did not show up for nutrition follow up. He is scheduled for next nutrition follow up on June 16.

## 2020-06-01 ENCOUNTER — Ambulatory Visit
Admission: RE | Admit: 2020-06-01 | Discharge: 2020-06-01 | Disposition: A | Discharge status: Home / Self Care | Payer: Medicaid Other | Source: Ambulatory Visit | Attending: Radiation Oncology | Admitting: Radiation Oncology

## 2020-06-01 ENCOUNTER — Other Ambulatory Visit: Payer: Self-pay

## 2020-06-01 DIAGNOSIS — Z79899 Other long term (current) drug therapy: Secondary | ICD-10-CM | POA: Diagnosis not present

## 2020-06-01 DIAGNOSIS — Z8589 Personal history of malignant neoplasm of other organs and systems: Secondary | ICD-10-CM | POA: Diagnosis present

## 2020-06-01 DIAGNOSIS — Z923 Personal history of irradiation: Secondary | ICD-10-CM | POA: Diagnosis not present

## 2020-06-01 DIAGNOSIS — E43 Unspecified severe protein-calorie malnutrition: Secondary | ICD-10-CM | POA: Diagnosis not present

## 2020-06-01 DIAGNOSIS — I700 Atherosclerosis of aorta: Secondary | ICD-10-CM | POA: Diagnosis not present

## 2020-06-01 DIAGNOSIS — R634 Abnormal weight loss: Secondary | ICD-10-CM | POA: Diagnosis not present

## 2020-06-01 DIAGNOSIS — R5383 Other fatigue: Secondary | ICD-10-CM | POA: Diagnosis not present

## 2020-06-01 DIAGNOSIS — R64 Cachexia: Secondary | ICD-10-CM | POA: Diagnosis not present

## 2020-06-01 DIAGNOSIS — R05 Cough: Secondary | ICD-10-CM | POA: Diagnosis not present

## 2020-06-01 DIAGNOSIS — F17210 Nicotine dependence, cigarettes, uncomplicated: Secondary | ICD-10-CM | POA: Diagnosis not present

## 2020-06-01 DIAGNOSIS — J439 Emphysema, unspecified: Secondary | ICD-10-CM | POA: Diagnosis not present

## 2020-06-01 LAB — CMP (CANCER CENTER ONLY)
ALT: 9 U/L (ref 0–44)
AST: 13 U/L — ABNORMAL LOW (ref 15–41)
Albumin: 3.1 g/dL — ABNORMAL LOW (ref 3.5–5.0)
Alkaline Phosphatase: 73 U/L (ref 38–126)
Anion gap: 13 (ref 5–15)
BUN: 16 mg/dL (ref 6–20)
CO2: 27 mmol/L (ref 22–32)
Calcium: 9.5 mg/dL (ref 8.9–10.3)
Chloride: 102 mmol/L (ref 98–111)
Creatinine: 0.79 mg/dL (ref 0.61–1.24)
GFR, Est AFR Am: >60 mL/min (ref >60)
GFR, Estimated: >60 mL/min (ref >60)
Glucose, Bld: 154 mg/dL — ABNORMAL HIGH (ref 70–99)
Potassium: 4.1 mmol/L (ref 3.5–5.1)
Sodium: 142 mmol/L (ref 135–145)
Total Bilirubin: 0.6 mg/dL (ref 0.3–1.2)
Total Protein: 7.8 g/dL (ref 6.5–8.1)

## 2020-06-01 LAB — MAGNESIUM: Magnesium: 1.8 mg/dL (ref 1.7–2.4)

## 2020-06-01 LAB — PHOSPHORUS: Phosphorus: 3.8 mg/dL (ref 2.5–4.6)

## 2020-06-01 NOTE — Progress Notes (Signed)
Oncology Nurse Navigator Documentation  I called and spoke to Perry Waller sister Perry Waller regarding his recently missed nutrition appointment. She asked that I call and speak to Perry Waller who is the patient's nephew. I called and spoke to Perry Waller and made him aware of his upcoming rescheduled nutrition appointment for 06/13/20. He wrote the appointment down and verbalized to me that he would assist his uncle to that appointment. I also provided my direct contact number to [facility] for future use, if needed by him. He voiced his appreciation for the phone call.   Harlow Asa RN, BSN, OCN Head & Neck Oncology Nurse [place] at [facility] Phone # [phone]  Fax # [phone]

## 2020-06-13 ENCOUNTER — Encounter: Payer: Self-pay | Admitting: Nutrition

## 2020-06-13 ENCOUNTER — Inpatient Hospital Stay: Payer: Medicaid Other | Admitting: Nutrition

## 2020-06-13 NOTE — Progress Notes (Signed)
Patient did not show up for nutrition appointment.This is the third appointment in a row he has not shown up for, despite reminders from Nurse Navigator.

## 2020-07-22 ENCOUNTER — Other Ambulatory Visit: Payer: Self-pay

## 2020-07-22 ENCOUNTER — Emergency Department (HOSPITAL_COMMUNITY)
Admission: EM | Admit: 2020-07-22 | Discharge: 2020-07-22 | Disposition: A | Discharge status: Home / Self Care | Payer: Medicaid Other | Attending: Emergency Medicine | Admitting: Emergency Medicine

## 2020-07-22 ENCOUNTER — Encounter (HOSPITAL_COMMUNITY): Payer: Self-pay

## 2020-07-22 DIAGNOSIS — F17210 Nicotine dependence, cigarettes, uncomplicated: Secondary | ICD-10-CM | POA: Diagnosis not present

## 2020-07-22 DIAGNOSIS — J9500 Unspecified tracheostomy complication: Secondary | ICD-10-CM

## 2020-07-22 DIAGNOSIS — Z7951 Long term (current) use of inhaled steroids: Secondary | ICD-10-CM | POA: Insufficient documentation

## 2020-07-22 DIAGNOSIS — J9503 Malfunction of tracheostomy stoma: Secondary | ICD-10-CM | POA: Diagnosis present

## 2020-07-22 DIAGNOSIS — R05 Cough: Secondary | ICD-10-CM | POA: Diagnosis not present

## 2020-07-22 DIAGNOSIS — Z8521 Personal history of malignant neoplasm of larynx: Secondary | ICD-10-CM | POA: Insufficient documentation

## 2020-07-22 DIAGNOSIS — J449 Chronic obstructive pulmonary disease, unspecified: Secondary | ICD-10-CM | POA: Diagnosis not present

## 2020-07-22 NOTE — Discharge Instructions (Signed)
If you have any new bleeding from the wound, any new pain or swelling in your neck, or any difficulty breathing, please call 911 or return to the ER.  Otherwise follow-up with the ear nose and throat specialist this week.  Their number is provided
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
## 2020-07-22 NOTE — ED Triage Notes (Signed)
Pt BIB [facility] for trach dislodge. Pt's trach came out this evening, pt was trying to put it back in himself. Trach hole is small and swollen and was unable to get trach back in. Pt is in no type of respiratory distress and denies any pain. Pt is 99% on RA.

## 2020-08-08 ENCOUNTER — Other Ambulatory Visit: Payer: Self-pay | Admitting: Otolaryngology

## 2020-08-08 DIAGNOSIS — C329 Malignant neoplasm of larynx, unspecified: Secondary | ICD-10-CM

## 2020-08-16 ENCOUNTER — Telehealth: Payer: Self-pay | Admitting: *Deleted

## 2020-08-16 NOTE — Telephone Encounter (Signed)
CALLED PATIENT'S SISTER, HENREITTE NICKELSON TO INFORM OF CT CHEST BEING MOVED TO GI, AFTER CT SOFT TISSUE NECK ON 08-17-20 PATIENT TO HAVE WATER ONLY - 4 HRS, PRIOR TO TEST, PATIENT TO RECEIVE RESULTS FROM DR. SQUIRE ON 08-24-20 @ 3:20 PM, PATIENT'S SISTER, HENRIETTE  NICKELSON VERIFIED UNDERSTANDING ALL THESE APPTS.

## 2020-08-17 ENCOUNTER — Ambulatory Visit: Payer: Medicaid Other | Admitting: Radiation Oncology

## 2020-08-17 ENCOUNTER — Other Ambulatory Visit: Payer: Self-pay

## 2020-08-17 ENCOUNTER — Ambulatory Visit
Admission: RE | Admit: 2020-08-17 | Discharge: 2020-08-17 | Disposition: A | Discharge status: Home / Self Care | Payer: Medicaid Other | Source: Ambulatory Visit | Attending: Otolaryngology | Admitting: Otolaryngology

## 2020-08-17 ENCOUNTER — Ambulatory Visit
Admission: RE | Admit: 2020-08-17 | Discharge: 2020-08-17 | Disposition: A | Discharge status: Home / Self Care | Payer: Medicaid Other | Source: Ambulatory Visit | Attending: Radiation Oncology | Admitting: Radiation Oncology

## 2020-08-17 DIAGNOSIS — C321 Malignant neoplasm of supraglottis: Secondary | ICD-10-CM

## 2020-08-17 DIAGNOSIS — C329 Malignant neoplasm of larynx, unspecified: Secondary | ICD-10-CM

## 2020-08-17 MED ORDER — IOPAMIDOL (ISOVUE-300) INJECTION 61%
100.0000 mL | Freq: Once | INTRAVENOUS | Status: AC | PRN
  Administered 2020-08-17: 100 mL via INTRAVENOUS

## 2020-08-21 ENCOUNTER — Telehealth: Payer: Self-pay | Admitting: *Deleted

## 2020-08-21 NOTE — Telephone Encounter (Signed)
Called patient 's sister- Lorna Dibble to advise that she needs to be available when Dr. Isidore Moos calls on 08-24-20 @ 3:40 pm, spoke with Ms. Lajoyce Corners and she agreed to this

## 2020-08-23 ENCOUNTER — Ambulatory Visit (HOSPITAL_COMMUNITY): Payer: Medicaid Other

## 2020-08-24 ENCOUNTER — Other Ambulatory Visit: Payer: Self-pay

## 2020-08-24 ENCOUNTER — Ambulatory Visit
Admission: RE | Admit: 2020-08-24 | Discharge: 2020-08-24 | Disposition: A | Discharge status: Home / Self Care | Payer: Medicaid Other | Source: Ambulatory Visit | Attending: Radiation Oncology | Admitting: Radiation Oncology

## 2020-08-24 DIAGNOSIS — C321 Malignant neoplasm of supraglottis: Secondary | ICD-10-CM

## 2020-08-24 NOTE — Progress Notes (Signed)
Radiation Oncology         [phone]) [phone] ________________________________  Name: Perry Waller MRN: [id number]  Date: 08/24/2020  DOB: April 17, 1961  Follow-Up Visit Note by telephone.  The patient opted for telemedicine to maximize safety during the pandemic.  MyChart video was not obtainable.  CC: System, Pcp Not In  [name], MD  Diagnosis and Prior Radiotherapy:         ICD-10-CM   1. 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  The goal of this follow-up is to review his CT results.  On August 2018 he underwent CT of the neck and chest with contrast.  I reviewed those images personally.  The images are degraded by motion.  The pulmonary opacities show evidence of waxing and waning.  Findings are suspicious for chronic aspiration or an infectious process.  He has chronic scarring in his lungs.  Neck demonstrates edema in the larynx and no evidence of tumor in the throat or lymph nodes.  The patient's sister states that he is not using his PEG tube despite continuing to lose weight. He has not seen his PCP ([place]) or lung doctor (Dr Loanne Drilling, [facility]) recently. His lung doctor has been on maternity leave for 3 mo, they believe. He would like to see Dr. Constance Holster about his ear running fluid.   He continues to drink alcohol against medical advice.  His tracheostomy site has healed after trach removal.  He is not performing his swallowing exercises despite encouragement from his medical team and family.  ALLERGIES:  is allergic to fish allergy.  Meds: Current Outpatient Medications  Medication Sig Dispense Refill    . feeding supplement, ENSURE COMPLETE, (ENSURE COMPLETE) LIQD Take 237 mLs by mouth 3 (three) times daily between meals. (Patient not taking: Reported on 07/22/2020) 21330 mL 0  . Nutritional Supplements (FEEDING SUPPLEMENT, OSMOLITE 1.5 CAL,) LIQD Osmolite 1.5 via G-tube at 65 mL an hour for 11 hours with 180 mL free water before and after continuous feedings.  In addition, give 1 carton Osmolite 1.5 with 60 mL free water before and after bolus feeding via G-tube twice daily.  This provides 1775 cal, 74.5 g protein, 1505 mL free water. (Patient not taking: Reported on 07/22/2020) 1185 mL 0  . pentoxifylline (TRENTAL) 400 MG CR tablet Take 400mg  tab daily x 1 week, then 400mg  BID; take with food (Patient taking differently: Take 400 mg by mouth in the morning and at bedtime. ) 60 tablet 5  . Tiotropium Bromide-Olodaterol (STIOLTO RESPIMAT) 2.5-2.5 MCG/ACT AERS Inhale 2 puffs into the lungs daily. (Patient not taking: Reported on 07/22/2020) 4 g 6  . Tiotropium Bromide-Olodaterol (STIOLTO RESPIMAT) 2.5-2.5 MCG/ACT AERS Inhale 2 puffs into the lungs daily. 4 g 0  . vitamin E 180 MG (400 UNITS) capsule Take 400 IU daily x 1 week, then 400 IU BID (Patient not taking: Reported on 03/06/2020) 60 capsule 5   No current facility-administered medications for this encounter.    Physical Findings: The patient is in no acute distress.   Wt Readings from Last 3 Encounters:  05/01/20 84 lb  9.6 oz (38.4 kg)  03/06/20 94 lb (42.6 kg)  01/18/20 98 lb 6 oz (44.6 kg)    vitals were not taken for this visit. .  Patient's voice is hoarse   Lab Findings: Lab Results  Component Value Date   WBC 8.3 08/15/2019   HGB 10.6 (L) 08/15/2019   HCT 33.7 (L) 08/15/2019   MCV 97.1 08/15/2019   PLT 255 08/15/2019    Lab Results  Component Value Date   TSH 2.798 05/01/2020    Radiographic Findings: CT SOFT TISSUE NECK W CONTRAST  Result Date: 08/17/2020 CLINICAL DATA:  Squamous cell carcinoma of the larynx.   Follow-up. EXAM: CT NECK WITH CONTRAST TECHNIQUE: Multidetector CT imaging of the neck was performed using the standard protocol following the bolus administration of intravenous contrast. CONTRAST:  141mL ISOVUE-300 IOPAMIDOL (ISOVUE-300) INJECTION 61% COMPARISON:  05/10/2020 CT.  PET scan 01/17/2020. FINDINGS: Pharynx and larynx: Tracheostomy has been removed. Tracheostomy site is in the process closing. There is less edema and swelling in the region of the larynx and supraglottic region. Findings are consistent with radiation edema. No finding to suggest progressive tumor. Salivary glands: Parotid glands appear normal. Atrophic submandibular glands. Thyroid: Normal Lymph nodes: Resolution of previously seen right-sided lymphadenopathy. No evidence of residual or recurrent nodal disease. Vascular: No vascular occlusion or stenosis. Limited intracranial: Normal Visualized orbits: Limited Mastoids and visualized paranasal sinuses: Mucosal thickening and layering fluid in the left maxillary sinus. Skeleton: Ordinary mid cervical spondylosis. Upper chest: Pronounced emphysema and pulmonary scarring. Other: None IMPRESSION: Edema in the glottic and supraglottic region consistent with prior radiation treatment. No identifiable enhancing tumor. Resolution of nodal disease in the right neck without evidence of new or recurrent adenopathy. Tracheostomy removed.  Tracheostomy site in the process of closing. This would be categorized as NI-RADS 1 based on the CT alone. Electronically Signed   By: Nelson Chimes M.D.   On: 08/17/2020 17:57   CT Chest W Contrast  Result Date: 08/17/2020 CLINICAL DATA:  Pneumonia, unresolved. Squamous cell carcinoma of the larynx. EXAM: CT CHEST WITH CONTRAST TECHNIQUE: Multidetector CT imaging of the chest was performed during intravenous contrast administration. CONTRAST:  111mL ISOVUE-300 IOPAMIDOL (ISOVUE-300) INJECTION 61% COMPARISON:  05/10/2020 FINDINGS: Cardiovascular: Aortic and  branch vessel atherosclerosis. Normal heart size, without pericardial effusion. Multivessel coronary artery atherosclerosis. No central pulmonary embolism, on this non-dedicated study. Mediastinum/Nodes: Small middle mediastinal nodes are similar. An AP window node measures 9 mm today versus 10 mm on the prior exam, favored to be reactive. 8 mm right hilar node is similar. Lungs/Pleura: No pleural fluid. Advanced bullous type emphysema. Fluid within the left greater than right lower lobe endobronchial tree use, progressive on the left and new on the right. Biapical pleuroparenchymal scarring. The soft tissue components are slightly increased bilaterally, including at up to 2.4 cm on the right on 26/9 and maximally 2.5 cm on the left on 28/9. Mild motion degradation inferiorly. Minimal improvement in left lower lobe aeration. Persistent peribronchovascular nodularity with tree in bud morphology. Right lower lobe tree-in-bud nodularity is increased. Improved inferior right middle lobe and lingular aeration. Upper Abdomen: Motion degradation continuing into the upper abdomen. Normal imaged portions of the liver, spleen, stomach, adrenal glands, right kidney. Upper pole left renal scarring. Abdominal aortic atherosclerosis. Paucity of abdominal fat. Musculoskeletal: No acute osseous abnormality. IMPRESSION: 1. Motion degraded exam. 2. Waxing and waning pulmonary opacities, with morphology and clinical history which are highly suspicious for chronic aspiration. Infectious bronchiolitis could look similar.  3. Biapical pleuroparenchymal scarring. Symmetric increased soft tissue density components. If the patient has had radiation therapy to the head neck, this could be secondary. If not, superimposed infection would be a secondary consideration. Felt unlikely to represent neoplasm, given symmetric appearance. 4. Aortic atherosclerosis (ICD10-I70.0), coronary artery atherosclerosis and emphysema (ICD10-J43.9).  Electronically Signed   By: Abigail Miyamoto M.D.   On: 08/17/2020 15:58    Impression/Plan:     Head and Neck Cancer Status: NED on imaging but patient's history continues to be consistent with failure to thrive.  I am concerned about his continued alcohol use, his continued weight loss, and his lack of motivation to use his PEG tube, conduct swallowing exercises, and stop drinking alcohol.  This is affecting his health.  Furthermore he is also lost to follow-up with other medical providers including his primary doctor and his pulmonologist  I asked our patient navigator to navigate him back to his primary doctor and his pulmonologist.  I will defer to his pulmonologist on future imaging as appropriate of his lungs.  We will also try to get him an appointment with Dr. Constance Holster regarding the fluid that is draining from his ear.  The patient and his sister Landscape architect) and I discussed the role the palliative care can play in his treatment and his wellbeing.  I encouraged them to meet with the palliative care specialist here at the cancer center to have a discussion about how they would like to move forward with his care.  I do think that an in person consultation is important.  His family seems overwhelmed with his care and at a loss for how to help him when he is not motivated to be proactive about his own health.  Patient and family they are amenable to palliative care. I asked Mont [place] to navigate that and add him to the palliative care tumor board.   Thyroid function: check TSH annually: WNL Lab Results  Component Value Date   TSH 2.798 05/01/2020    I will see him back in 5 months for follow-up, sooner if needed.   On date of encounter, in total, I spent 30 minutes on this encounter. This encounter was provided by telemedicine platform by telephone.  The patient opted for telemedicine to maximize safety during the pandemic.  MyChart video was not obtainable. The patient has given  verbal consent for this type of encounter and has been advised to only accept a meeting of this type in a secure network environment. The attendants for this meeting include Eppie Gibson  and Tempie Hoist and his sister [facility]). During the encounter, Eppie Gibson was located at [facility] Radiation Oncology Department.  [place] Maston was located at home.   _____________________________________   Eppie Gibson, MD

## 2020-08-28 ENCOUNTER — Other Ambulatory Visit: Payer: Self-pay | Admitting: Radiation Therapy

## 2020-08-28 ENCOUNTER — Encounter: Payer: Self-pay | Admitting: Radiation Oncology

## 2020-08-28 NOTE — Progress Notes (Signed)
Oncology Nurse Navigator Documentation  Per patient's 08/24/20 post-treatment follow-up with Dr. Isidore Moos, sent fax to [facility] ENT Scheduling with request  Mr. Gossen be contacted and scheduled for follow-up with Dr. Constance Holster in 1-2 months due to fluid draining from his ear.  Notification of successful fax transmission received.  Harlow Asa RN, BSN, OCN Head & Neck Oncology Nurse [place] at [facility] Phone # [phone]  Fax # [phone]

## 2020-08-29 NOTE — Therapy (Signed)
[facility] [address] [place], [place], [address] Phone: [phone]   Fax:  [phone]  Patient Details  Name: Perry Waller MRN: [id number] Date of Birth: 1961/07/08 Referring Provider:  Eppie Gibson, MD  Encounter Date: 08/29/2020   SPEECH THERAPY DISCHARGE SUMMARY  Visits from Start of Care: 4 (with one arrived-no charge)  Current functional level related to goals / functional outcomes: Pt appeared to swallow safely without overt s/sx aspiration with POs in November 2020. In January a mix-up in location unfortunately occurred despite family being told where pt was to be seen, and SLP drove to another site to see pt. Pt was upset about his trach care and appeared unable to understand that SLP did not have information about trach follow up. SLP phoned pt's ENT's office and initiated a follow up visit for pt to see his ENT. SLP asked pt to schedule a follow up appointment and he chose not to do so.   Remaining deficits: Assume no deficits from pt's last clinical care in November 2020, however from pt's more recent notes, SLP suspects pt is having swallowing difficulty.   Education / Equipment: HEP for swallowing, late effects of radiation on swallowing ability.   Plan: Patient agrees to discharge.  Patient goals were partially met. Patient is being discharged due to not returning since the last visit.  ?????and the patient's request.       Garald Balding ,[place], [place]  08/29/2020, 8:08 AM  [facility] [address] [place], [place], [address] Phone: [phone]   Fax:  [phone]

## 2020-08-31 NOTE — Progress Notes (Signed)
Oncology Nurse Navigator Documentation  I called and spoke to Mr. Krabill sister, [place]. I informed her of two appointments that I have gotten scheduled for Mr. [place] at the request of Dr. Isidore Moos. He is scheduled to see Dr. Loanne Drilling on 09/28/20 at 2 pm and [place] Hollis NP at the [facility] [place] on 10/23/20 at 10:20. She wrote the appointment times down as we were speaking and was agreeable to the dates, times, and aware of the locations.   Harlow Asa RN, BSN, OCN Head & Neck Oncology Nurse [place] at [facility] Phone # [phone]  Fax # [phone]

## 2020-09-07 ENCOUNTER — Other Ambulatory Visit: Payer: Self-pay

## 2020-09-07 DIAGNOSIS — C321 Malignant neoplasm of supraglottis: Secondary | ICD-10-CM

## 2020-09-07 NOTE — Progress Notes (Signed)
Oncology Nurse Navigator Documentation    I have placed an order for removal of Mr. Altamura feeding tube at the request of Dr. Isidore Moos. Mr. Pindell recently met with Wadie Lessen NP with palliative care. He reported that he had not used the feeding tube in 2 months. He does have continued weight loss but requests the feeding tube be removed and is supported in this decision by his sister and nephew. I will notify his sister that they will be receiving a phone call with an appointment for removal. He will continue to see Dr. Isidore Moos for follow up appointments and scans as necessary. His future likely decline was discussed during their meeting with [facility]. I have left my direct number for them to call if they have any further needs.  Harlow Asa RN, BSN, OCN Head & Neck Oncology Nurse [place] at [facility] Phone # [phone]  Fax # [phone]

## 2020-09-11 ENCOUNTER — Inpatient Hospital Stay: Payer: Medicaid Other | Attending: Radiation Oncology

## 2020-09-12 ENCOUNTER — Other Ambulatory Visit: Payer: Self-pay

## 2020-09-12 ENCOUNTER — Ambulatory Visit (HOSPITAL_COMMUNITY)
Admission: RE | Admit: 2020-09-12 | Discharge: 2020-09-12 | Disposition: A | Discharge status: Home / Self Care | Payer: Medicaid Other | Source: Ambulatory Visit | Attending: Radiation Oncology | Admitting: Radiation Oncology

## 2020-09-12 DIAGNOSIS — C321 Malignant neoplasm of supraglottis: Secondary | ICD-10-CM | POA: Insufficient documentation

## 2020-09-12 DIAGNOSIS — Z431 Encounter for attention to gastrostomy: Secondary | ICD-10-CM | POA: Diagnosis not present

## 2020-09-12 HISTORY — PX: IR GASTROSTOMY TUBE REMOVAL: IMG5492

## 2020-09-12 MED ORDER — LIDOCAINE VISCOUS HCL 2 % MT SOLN
OROMUCOSAL | Status: AC
  Administered 2020-09-12: 5 mL
  Filled 2020-09-12: qty 15

## 2020-09-12 NOTE — Progress Notes (Signed)
Gastrostomy tube removed per order. Procedure explained to patient, viscous lidocaine applied around site and allowed to sit for approximately 10 minutes. Gastrostomy tube removed intact without difficulty. Patient tolerated procedure well.    Soyla Dryer, [place] [phone] 09/12/2020, 1:02 PM

## 2020-09-28 ENCOUNTER — Other Ambulatory Visit: Payer: Self-pay

## 2020-09-28 ENCOUNTER — Ambulatory Visit (INDEPENDENT_AMBULATORY_CARE_PROVIDER_SITE_OTHER): Payer: Medicaid Other | Admitting: Pulmonary Disease

## 2020-09-28 ENCOUNTER — Encounter: Payer: Self-pay | Admitting: Pulmonary Disease

## 2020-09-28 VITALS — BP 124/70 | HR 100 | Temp 97.2°F | Wt 93.4 lb

## 2020-09-28 DIAGNOSIS — J432 Centrilobular emphysema: Secondary | ICD-10-CM | POA: Diagnosis not present

## 2020-09-28 DIAGNOSIS — Z720 Tobacco use: Secondary | ICD-10-CM

## 2020-09-28 DIAGNOSIS — Z430 Encounter for attention to tracheostomy: Secondary | ICD-10-CM | POA: Diagnosis not present

## 2020-09-28 DIAGNOSIS — R9389 Abnormal findings on diagnostic imaging of other specified body structures: Secondary | ICD-10-CM | POA: Diagnosis not present

## 2020-09-28 MED ORDER — STIOLTO RESPIMAT 2.5-2.5 MCG/ACT IN AERS: 2.0000 | INHALATION_SPRAY | Freq: Every day | RESPIRATORY_TRACT | 11 refills | Status: DC

## 2020-09-28 MED ORDER — IPRATROPIUM-ALBUTEROL 0.5-2.5 (3) MG/3ML IN SOLN: 3.0000 mL | Freq: Three times a day (TID) | RESPIRATORY_TRACT | 2 refills | Status: DC

## 2020-09-28 NOTE — Progress Notes (Signed)
Subjective:   PATIENT ID: Perry Waller GENDER: male DOB: 06-Nov-1961, MRN: [id number]   HPI  Chief Complaint  Patient presents with  . Follow-up    pt last seen in Jan 2021.  he stated that he is out of all meds.  out for 3 months.    Reason for Visit: New consult for COPD  [name] is a 59 year old male with head and neck cancer s/p chemoradiation s/p trach and PEG who presents for follow-up.  Since our last visit, he has been decannulated (trach dislodged and after ED spoke with ED, no reason to replace trach) and had G-tube removed after passing swallow evaluation. He ran out of medications 3 weeks ago. Before he was compliant with Stioloto and felt this made a difference. Since being off his inhaler, he has used albuterol continuously and continues to have severe shortness of breath with wheezing and chronic cough with sputum production, unchanged in color. Symptoms worsens with short distances and at rest.  Social History: Active smoker. Currently 1/2 ppd. Previousl 2ppd x 40 years.  Environmental exposures:  Chemotherapy Radiation Previously worked in cropping tobacco x 30 years  I have personally reviewed patient's past medical/family/social history/allergies/current medications  Past Medical History:  Diagnosis Date  . Acute respiratory acidosis 06/29/2019  . Acute respiratory failure with hypoxia ([facility]) 06/30/2019  . Arthritis   . COPD (chronic obstructive pulmonary disease) ([facility])   . History of radiation therapy 08/17/19- 10/10/19   Larynx treated with 35 fractions of 2 Gy each to total 70 Gy.      Family History  Problem Relation Age of Onset  . Cancer Father   . Cancer Brother      Social History   Occupational History  . Not on file  Tobacco Use  . Smoking status: Current Every Day Smoker    Packs/day: 0.50    Years: 45.00    Pack years: 22.50    Types: Cigarettes  . Smokeless tobacco: Never Used  . Tobacco comment: he reports smoking 5 cigarettes  daily-01/18/20  Vaping Use  . Vaping Use: Never used  Substance and Sexual Activity  . Alcohol use: Yes    Comment: one beer weekly.   . Drug use: No  . Sexual activity: Not on file    Allergies  Allergen Reactions  . Fish Allergy Other (See Comments)    Breaks out     Outpatient Medications Prior to Visit  Medication Sig Dispense Refill  . feeding supplement, ENSURE COMPLETE, (ENSURE COMPLETE) LIQD Take 237 mLs by mouth 3 (three) times daily between meals. (Patient not taking: Reported on 07/22/2020) 21330 mL 0  . Nutritional Supplements (FEEDING SUPPLEMENT, OSMOLITE 1.5 CAL,) LIQD Osmolite 1.5 via G-tube at 65 mL an hour for 11 hours with 180 mL free water before and after continuous feedings.  In addition, give 1 carton Osmolite 1.5 with 60 mL free water before and after bolus feeding via G-tube twice daily.  This provides 1775 cal, 74.5 g protein, 1505 mL free water. (Patient not taking: Reported on 07/22/2020) 1185 mL 0  . pentoxifylline (TRENTAL) 400 MG CR tablet Take 400mg  tab daily x 1 week, then 400mg  BID; take with food (Patient not taking: Reported on 09/28/2020) 60 tablet 5  . Tiotropium Bromide-Olodaterol (STIOLTO RESPIMAT) 2.5-2.5 MCG/ACT AERS Inhale 2 puffs into the lungs daily. (Patient not taking: Reported on 07/22/2020) 4 g 6  . Tiotropium Bromide-Olodaterol (STIOLTO RESPIMAT) 2.5-2.5 MCG/ACT AERS Inhale 2 puffs into  the lungs daily. (Patient not taking: Reported on 09/28/2020) 4 g 0  . vitamin E 180 MG (400 UNITS) capsule Take 400 IU daily x 1 week, then 400 IU BID (Patient not taking: Reported on 03/06/2020) 60 capsule 5   No facility-administered medications prior to visit.    Review of Systems  Constitutional: Negative for chills, diaphoresis, fever, malaise/fatigue and weight loss.  HENT: Positive for congestion.   Respiratory: Positive for cough, sputum production, shortness of breath and wheezing. Negative for hemoptysis.   Cardiovascular: Negative for chest pain,  palpitations and leg swelling.    Objective:   Vitals:   09/28/20 1409  BP: 124/70  Pulse: 100  Temp: (!) 97.2 F (36.2 C)  TempSrc: Oral  SpO2: 95%  Weight: 93 lb 6 oz (42.4 kg)   SpO2: 95 %  Physical Exam: General: Thin, cachectic-appearing, no acute distress HENT: [place], AT, hoarse voice Neck: Trach stoma Eyes: EOMI, no scleral icterus Respiratory: Diminished breath sounds bilaterally. Occasional expiratory wheezing Cardiovascular: RRR, -M/R/G, no JVD Extremities:-Edema,-tenderness Neuro: AAO x4, CNII-XII grossly intact Psych: Normal mood, normal affect  Data Reviewed:  Imaging: CT Chest 08/08/19 : Severe centrilobular and paraseptal emphysema CT Chest 05/10/20: Right upper lobe scarring noted. Increased opacities in LLL and RML. Background emphysema. Trach in place CT Chest 08/17/20 : Right upper lobe scarring that appears increased. Left lower lobe GGO with tree-in-bud nodularity which appears similar compared to 04/2020 imaging. Severe emphysema. Interval removal of trach  PFT: None on file  Imaging, labs and test noted above have been reviewed independently by me.  Assessment & Plan:   Discussion: 59 year old male active smoker with head and neck cancer s/p chemoradiation.   Severe emphysema - uncontrolled REFILL Stioloto TWO puffs ONCE a day. This is your EVERYDAY medication START Duoneb nebulizer three times a day for the next week. Then use as needed.  Concern for chronic aspiration Patient passed formal swallow evaluation and s/p G-tube removal however chest imaging suggestive of infection/inflammation that may be related to aspiration. Will evaluate at next visit after respiratory status better controlled  Tobacco abuse Patient is an active smoker. Cutting down to 1/2 ppd. We discussed smoking cessation for 3 minutes. We discussed triggers and stressors and ways to deal with them. We discussed barriers to continued smoking and benefits of smoking cessation.    S/p tracheostomy Dislodged trach in 06/2020. No anatomical indication to replace per ENT Routine care  Health Maintenance Immunization History  Administered Date(s) Administered  . Pneumococcal Polysaccharide-23 08/07/2019   CT Lung Screen - not indicated  No orders of the defined types were placed in this encounter.  Meds ordered this encounter  Medications  . Tiotropium Bromide-Olodaterol (STIOLTO RESPIMAT) 2.5-2.5 MCG/ACT AERS    Sig: Inhale 2 puffs into the lungs daily.    Dispense:  4 g    Refill:  11  . ipratropium-albuterol (DUONEB) 0.5-2.5 (3) MG/3ML SOLN    Sig: Take 3 mLs by nebulization in the morning, at noon, and at bedtime.    Dispense:  360 mL    Refill:  2    Return in about 2 weeks (around 10/12/2020).  I have spent a total time of 35-minutes on the day of the appointment reviewing prior documentation, coordinating care and discussing medical diagnosis and plan with the patient/family. Imaging, labs and tests included in this note have been reviewed and interpreted independently by me.  Claritza July Rodman Pickle, MD [facility] 09/28/2020 2:28 PM  Office Number [phone]

## 2020-09-28 NOTE — Patient Instructions (Addendum)
Severe emphysema - uncontrolled REFILL Stioloto TWO puffs ONCE a day. This is your EVERYDAY medication START Duoneb nebulizer three times a day for the next week. Then use as needed. We will contact your sister with instructions  Follow-up with me in 2 weeks

## 2020-10-12 ENCOUNTER — Other Ambulatory Visit: Payer: Self-pay

## 2020-10-12 ENCOUNTER — Ambulatory Visit (INDEPENDENT_AMBULATORY_CARE_PROVIDER_SITE_OTHER): Payer: Medicaid Other | Admitting: Pulmonary Disease

## 2020-10-12 ENCOUNTER — Encounter: Payer: Self-pay | Admitting: Pulmonary Disease

## 2020-10-12 VITALS — BP 120/72 | HR 95 | Temp 96.1°F | Ht 66.0 in | Wt 94.4 lb

## 2020-10-12 DIAGNOSIS — R059 Cough, unspecified: Secondary | ICD-10-CM | POA: Diagnosis not present

## 2020-10-12 DIAGNOSIS — Z720 Tobacco use: Secondary | ICD-10-CM

## 2020-10-12 DIAGNOSIS — J432 Centrilobular emphysema: Secondary | ICD-10-CM

## 2020-10-12 MED ORDER — OMEPRAZOLE 20 MG PO CPDR
40.0000 mg | DELAYED_RELEASE_CAPSULE | Freq: Every day | ORAL | 11 refills | Status: DC
Start: 2020-10-12 — End: 2021-10-14

## 2020-10-12 NOTE — Patient Instructions (Addendum)
Severe emphysema, active exacerbation CONTINUE Stioloto TWO puffs ONCE a day. This is your EVERYDAY inhaler START Duoneb nebulizer three times a day for the next week. Then use as needed. THIS is YOUR EVERYDAY nebulizer START Albuterol as needed for shortness of breath or wheezing. This is your RESCUE inhaler  We will arrange for nebulizer  Cough START omeprazole 40 mg daily

## 2020-10-12 NOTE — Progress Notes (Signed)
Subjective:   PATIENT ID: Perry Waller GENDER: male DOB: 13-May-1961, MRN: [id number]   HPI  Chief Complaint  Patient presents with   Follow-up    COPD, still having some SOB   Reason for Visit: Follow-up  [name] is a 59 year old male with head and neck cancer s/p chemoradiation s/p trach and PEG s/p removal who presents for follow-up.  Since our last visit, he reports his appetite has returned however has episodes of coughing that occur several minutes after eating and drinking. He does not cough with the act of eating but it seems to occur several minutes afterwards. Denies chest burning, abdominal pain, reflux or sour taste in his mouth, nausea or emesis. Does not occur when laying down. He is drinking more Ensure and attempting to get his weight up.   He did not pick up his Stioloto until yesterday. He has been using his albuterol 3-4 times at minimum daily. He received the prescription for his Duonebs however realized he did not have a nebulizer. He did not call the office as he planned to wait until this visit to inform me. He feels the Stiolto is making a tremendous difference. He has also cut down to 2-3 cigarettes daily from 1/2 ppd.  Social History: Active smoker. Previously 2ppd x 40 years. Currently 2-3 cigarettes daily.  Environmental exposures:  Chemotherapy Radiation Previously worked in cropping tobacco x 30 years  I have personally reviewed patient's past medical/family/social history/allergies/current medications.  Past Medical History:  Diagnosis Date   Acute respiratory acidosis 06/29/2019   Acute respiratory failure with hypoxia ([facility]) 06/30/2019   Arthritis    COPD (chronic obstructive pulmonary disease) ([facility])    History of radiation therapy 08/17/19- 10/10/19   Larynx treated with 35 fractions of 2 Gy each to total 70 Gy.      Outpatient Medications Prior to Visit  Medication Sig Dispense Refill   feeding supplement, ENSURE COMPLETE, (ENSURE  COMPLETE) LIQD Take 237 mLs by mouth 3 (three) times daily between meals. (Patient not taking: Reported on 07/22/2020) 21330 mL 0   ipratropium-albuterol (DUONEB) 0.5-2.5 (3) MG/3ML SOLN Take 3 mLs by nebulization in the morning, at noon, and at bedtime. 360 mL 2   Nutritional Supplements (FEEDING SUPPLEMENT, OSMOLITE 1.5 CAL,) LIQD Osmolite 1.5 via G-tube at 65 mL an hour for 11 hours with 180 mL free water before and after continuous feedings.  In addition, give 1 carton Osmolite 1.5 with 60 mL free water before and after bolus feeding via G-tube twice daily.  This provides 1775 cal, 74.5 g protein, 1505 mL free water. (Patient not taking: Reported on 07/22/2020) 1185 mL 0   pentoxifylline (TRENTAL) 400 MG CR tablet Take 400mg  tab daily x 1 week, then 400mg  BID; take with food (Patient not taking: Reported on 09/28/2020) 60 tablet 5   Tiotropium Bromide-Olodaterol (STIOLTO RESPIMAT) 2.5-2.5 MCG/ACT AERS Inhale 2 puffs into the lungs daily. (Patient not taking: Reported on 09/28/2020) 4 g 0   Tiotropium Bromide-Olodaterol (STIOLTO RESPIMAT) 2.5-2.5 MCG/ACT AERS Inhale 2 puffs into the lungs daily. 4 g 11   vitamin E 180 MG (400 UNITS) capsule Take 400 IU daily x 1 week, then 400 IU BID (Patient not taking: Reported on 03/06/2020) 60 capsule 5   No facility-administered medications prior to visit.    Review of Systems  Constitutional: Negative for chills, diaphoresis, fever, malaise/fatigue and weight loss.  HENT: Negative for congestion.   Respiratory: Positive for cough, sputum production, shortness  of breath and wheezing. Negative for hemoptysis.   Cardiovascular: Negative for chest pain, palpitations and leg swelling.  Gastrointestinal: Negative for abdominal pain, constipation, diarrhea, heartburn, nausea and vomiting.  Neurological: Positive for weakness.    Objective:   Vitals:   10/12/20 1139  BP: 120/72  Pulse: 95  Temp: (!) 96.1 F (35.6 C)  TempSrc: Oral  SpO2: 95%  Weight:  94 lb 6.4 oz (42.8 kg)  Height: 5\' 6"  (1.676 m)      Physical Exam: General: Cachectic, chronically ill, no acute distress, raspy voice HENT: [place], AT, OP clear, MMM, very poor dentition Neck: Trach stoma Eyes: EOMI, no scleral icterus Respiratory: Diminished. Clear to auscultation bilaterally.  No crackles, wheezing or rales Cardiovascular: RRR, -M/R/G, no JVD Gastro: BS+, no tenderness to palpation, non-distended Extremities:-Edema,-tenderness Neuro: AAO x4, CNII-XII grossly intact Skin: Intact, no rashes or bruising Psych: Normal mood, normal affect  Data Reviewed:  Imaging: CT Chest 08/08/19 : Severe centrilobular and paraseptal emphysema CT Chest 05/10/20: Right upper lobe scarring noted. Increased opacities in LLL and RML. Background emphysema. Trach in place CT Chest 08/17/20 : Right upper lobe scarring that appears increased. Left lower lobe GGO with tree-in-bud nodularity which appears similar compared to 04/2020 imaging. Severe emphysema. Interval removal of trach  PFT: None on file  Imaging, labs and test noted above have been reviewed independently by me.  Assessment & Plan:   Discussion: 59 year old male active smoker with head and neck cancer s/p chemoradiation. Re-discussed importance regarding inhaler adherence and encouraged to contact office if he does not have appropriate medications in the future. Goal is to avoid re-hospitalization. Will plan to address goals of care at next visit  Severe emphysema - uncontrolled CONTINUE Stioloto TWO puffs ONCE a day. This is your EVERYDAY inhaler START Duoneb nebulizer three times a day for the next week. Then use as needed. THIS is YOUR EVERYDAY nebulizer START Albuterol as needed for shortness of breath or wheezing. This is your RESCUE inhaler  We will arrange for nebulizer  Cough - chronic aspiration vs reflux Patient passed formal swallow evaluation and s/p G-tube removal however chest imaging suggestive of  infection/inflammation that may be related to aspiration. Will evaluate at next visit after respiratory status better controlled START omeprazole 40 mg daily  S/p tracheostomy Dislodged trach in 06/2020. No anatomical indication to replace per ENT Routine care  Tobacco abuse Patient is an active smoker. Cut down smoking   Health Maintenance Immunization History  Administered Date(s) Administered   Pneumococcal Polysaccharide-23 08/07/2019   CT Lung Screen - not indicated  Orders Placed This Encounter  Procedures   Ambulatory Referral for DME    Referral Priority:   Routine    Referral Type:   Durable Medical Equipment Purchase    Number of Visits Requested:   1   Meds ordered this encounter  Medications   omeprazole (PRILOSEC) 20 MG capsule    Sig: Take 2 capsules (40 mg total) by mouth daily.    Dispense:  30 capsule    Refill:  11    Return in about 1 month (around 11/12/2020).  [place], MD [facility] 10/12/2020 10:55 AM  Office Number [phone]

## 2020-10-13 ENCOUNTER — Encounter: Payer: Self-pay | Admitting: Pulmonary Disease

## 2020-10-13 DIAGNOSIS — Z430 Encounter for attention to tracheostomy: Secondary | ICD-10-CM | POA: Insufficient documentation

## 2020-10-13 DIAGNOSIS — R9389 Abnormal findings on diagnostic imaging of other specified body structures: Secondary | ICD-10-CM | POA: Insufficient documentation

## 2020-10-14 ENCOUNTER — Encounter: Payer: Self-pay | Admitting: Pulmonary Disease

## 2020-10-23 ENCOUNTER — Ambulatory Visit: Payer: Medicaid Other | Admitting: Family Medicine

## 2020-10-24 ENCOUNTER — Telehealth: Payer: Self-pay | Admitting: *Deleted

## 2020-10-24 NOTE — Telephone Encounter (Signed)
CALLED PATIENT'S NEPHEW DURELL Igarashi TO INFORM OF FU APPT. BEING MOVED TO 11-05-20 @ 1 PM, SPOKE WITH PATIENT'S NEPHEW AND HE IS AWARE OF THIS APPT. CHANGE AND IS GOOD WITH IT.

## 2020-11-02 NOTE — Progress Notes (Signed)
Perry Waller presents for follow up of radiation to his larynx completed on 10/10/2019.  Pain issues, if any: Reports generalized pain all over, though he states the pain in his right leg is worse than elsewhere. Presents to clinic in wheelchair because of difficulty walking Using a feeding tube?: Removed 09/12/2020 couple months ago Weight changes, if any:  Wt Readings from Last 3 Encounters:  11/05/20 91 lb 4 oz (41.4 kg)  10/12/20 94 lb 6.4 oz (42.8 kg)  09/28/20 93 lb 6 oz (42.4 kg)   Swallowing issues, if any: Yes--reports he has difficulty with anything more than liquids/soft foods Smoking or chewing tobacco? Continues to smoke ~2-5 cigarettes a day Using fluoride trays daily? N/A Last ENT visit was on: 08/07/2020 Saw Dr. Izora Gala: "Procedure note: Flexible fiberoptic laryngoscopy  --Findings: The posterior soft palate, uvula, tongue base and vallecula were visualized and appeared healthy without mucosal masses or lesions. The epiglottis is intact with some radiation edema. There is some pooling of secretions in both piriforms and post cricoid area. The supraglottic larynx has fullness bilaterally without any mucosal masses visible. The true cords are barely visible. They seem to be fixed. The airway is severely restricted. I am unable to visualize the mucosal surface of the glottic and subglottic larynx. --Impression & Plans: Advanced stage supraglottic cancer, tracheostomy tube came out sometime in the past few weeks and he is actually feeling better without it although he does have significant limitations of his airway. I would like to obtain a new CT of the neck to see if there is any evidence of change compared to May. We will consider the possibility of removing the feeding tube if appropriate. I explained to him the importance of smoking cessation. He will need a laryngectomy if his cancer returns."  Other notable issues, if any: Vocal hoarseness and appears to have difficulty catching  his breath  Vitals:   11/05/20 1306  BP: (!) 119/91  Pulse: 98  Resp: 18  Temp: (!) 97.1 F (36.2 C)  SpO2: 100%

## 2020-11-05 ENCOUNTER — Encounter: Payer: Self-pay | Admitting: Nutrition

## 2020-11-05 ENCOUNTER — Ambulatory Visit
Admission: RE | Admit: 2020-11-05 | Discharge: 2020-11-05 | Disposition: A | Discharge status: Home / Self Care | Payer: Medicaid Other | Source: Ambulatory Visit | Attending: Radiation Oncology | Admitting: Radiation Oncology

## 2020-11-05 ENCOUNTER — Other Ambulatory Visit: Payer: Self-pay

## 2020-11-05 VITALS — BP 119/91 | HR 98 | Temp 97.1°F | Resp 18 | Ht 66.0 in | Wt 91.2 lb

## 2020-11-05 DIAGNOSIS — Z79899 Other long term (current) drug therapy: Secondary | ICD-10-CM | POA: Diagnosis not present

## 2020-11-05 DIAGNOSIS — Z9119 Patient's noncompliance with other medical treatment and regimen: Secondary | ICD-10-CM | POA: Diagnosis not present

## 2020-11-05 DIAGNOSIS — M79604 Pain in right leg: Secondary | ICD-10-CM | POA: Diagnosis not present

## 2020-11-05 DIAGNOSIS — Z9114 Patient's other noncompliance with medication regimen: Secondary | ICD-10-CM | POA: Diagnosis not present

## 2020-11-05 DIAGNOSIS — R630 Anorexia: Secondary | ICD-10-CM | POA: Diagnosis not present

## 2020-11-05 DIAGNOSIS — R627 Adult failure to thrive: Secondary | ICD-10-CM | POA: Diagnosis not present

## 2020-11-05 DIAGNOSIS — F17210 Nicotine dependence, cigarettes, uncomplicated: Secondary | ICD-10-CM | POA: Diagnosis not present

## 2020-11-05 DIAGNOSIS — R262 Difficulty in walking, not elsewhere classified: Secondary | ICD-10-CM | POA: Diagnosis not present

## 2020-11-05 DIAGNOSIS — Z923 Personal history of irradiation: Secondary | ICD-10-CM | POA: Insufficient documentation

## 2020-11-05 DIAGNOSIS — R490 Dysphonia: Secondary | ICD-10-CM | POA: Insufficient documentation

## 2020-11-05 DIAGNOSIS — R0602 Shortness of breath: Secondary | ICD-10-CM | POA: Insufficient documentation

## 2020-11-05 DIAGNOSIS — Z993 Dependence on wheelchair: Secondary | ICD-10-CM | POA: Diagnosis not present

## 2020-11-05 DIAGNOSIS — C321 Malignant neoplasm of supraglottis: Secondary | ICD-10-CM | POA: Diagnosis present

## 2020-11-05 DIAGNOSIS — R059 Cough, unspecified: Secondary | ICD-10-CM | POA: Diagnosis not present

## 2020-11-05 MED ORDER — DEXAMETHASONE 0.5 MG/5ML PO SOLN: ORAL | 1 refills | Status: DC

## 2020-11-05 MED ORDER — LARYNGOSCOPY SOLUTION RAD-ONC
15.0000 mL | Freq: Once | TOPICAL | Status: AC
  Administered 2020-11-05: 15 mL via TOPICAL
  Filled 2020-11-05: qty 15

## 2020-11-05 NOTE — Progress Notes (Addendum)
Radiation Oncology         [phone] ________________________________  Name: Chip Canepa MRN: [id number]  Date: 11/05/2020  DOB: Jan 25, 1961  Follow-Up Visit Note in person OUTPATIENT  CC: Pcp, No  Jegede, Olugbemiga E, MD  Diagnosis and Prior Radiotherapy:         ICD-10-CM   1. Malignant neoplasm of supraglottis (HCC)  C32.1 dexamethasone (DECADRON) 0.5 MG/5ML solution    laryngocopy solution for Rad-Onc    Fiberoptic laryngoscopy   08/17/2019 through 10/10/2019 Site Technique Total Dose (Gy) Dose per Fx (Gy) Completed Fx Beam Energies  Head & neck: HN_larynx IMRT 70/70 2 35/35 6X    CHIEF COMPLAINT:  Here for follow-up and surveillance of laryngeal cancer   Mr. Crespo presents for follow up of radiation to his larynx completed on 10/10/2019.  Pain issues, if any: Reports generalized pain all over, though he states the pain in his right leg is worse than elsewhere. Presents to clinic in wheelchair because of chronic difficulty walking Using a feeding tube?: Removed 09/12/2020 couple months ago Weight changes, if any:  Wt Readings from Last 3 Encounters:  11/05/20 91 lb 4 oz (41.4 kg)  10/12/20 94 lb 6.4 oz (42.8 kg)  09/28/20 93 lb 6 oz (42.4 kg)   Swallowing issues, if any: Yes--reports he has difficulty with anything more than liquids/soft foods  However, his PEG was removed at patient's insistence - he had not used it for months, despite medical advice.   Smoking or chewing tobacco? Continues to smoke ~2-5 cigarettes a day Using fluoride trays daily? N/A Last ENT visit was on: 08/07/2020 Saw Dr. Izora Gala: "Procedure note: Flexible fiberoptic laryngoscopy  --Findings: The posterior soft palate, uvula, tongue base and vallecula were visualized and appeared healthy without mucosal masses or lesions. The epiglottis is intact with some radiation edema. There is some pooling of secretions in both piriforms and post cricoid area. The supraglottic larynx has fullness  bilaterally without any mucosal masses visible. The true cords are barely visible. They seem to be fixed. The airway is severely restricted. I am unable to visualize the mucosal surface of the glottic and subglottic larynx. --Impression & Plans: Advanced stage supraglottic cancer, tracheostomy tube came out sometime in the past few weeks and he is actually feeling better without it although he does have significant limitations of his airway. I would like to obtain a new CT of the neck to see if there is any evidence of change compared to May. We will consider the possibility of removing the feeding tube if appropriate. I explained to him the importance of smoking cessation. He will need a laryngectomy if his cancer returns."  Other notable issues, if any: Vocal hoarseness and appears to have difficulty catching his breath  Patient states top 3 symptomatic concerns are:  A) SOB B) Cough C) decreased appetite    Vitals:   11/05/20 1306  BP: (!) 119/91  Pulse: 98  Resp: 18  Temp: (!) 97.1 F (36.2 C)  SpO2: 100%     ALLERGIES:  is allergic to fish allergy.  Meds: Current Outpatient Medications  Medication Sig Dispense Refill   dexamethasone (DECADRON) 0.5 MG/5ML solution Take 40 mL BID through 11/14. On 11/15, taper to 56mL BID. On 11/22, taper to 74mL in the AM only. Continue that until you speak with Dr. Isidore Moos. Take with food. 1000 mL 1   feeding supplement, ENSURE COMPLETE, (ENSURE COMPLETE) LIQD Take 237 mLs by mouth 3 (three) times daily between meals.  21330 mL 0   ipratropium-albuterol (DUONEB) 0.5-2.5 (3) MG/3ML SOLN Take 3 mLs by nebulization in the morning, at noon, and at bedtime. 360 mL 2   Nutritional Supplements (FEEDING SUPPLEMENT, OSMOLITE 1.5 CAL,) LIQD Osmolite 1.5 via G-tube at 65 mL an hour for 11 hours with 180 mL free water before and after continuous feedings.  In addition, give 1 carton Osmolite 1.5 with 60 mL free water before and after bolus feeding via  G-tube twice daily.  This provides 1775 cal, 74.5 g protein, 1505 mL free water. 1185 mL 0   omeprazole (PRILOSEC) 20 MG capsule Take 2 capsules (40 mg total) by mouth daily. 30 capsule 11   Tiotropium Bromide-Olodaterol (STIOLTO RESPIMAT) 2.5-2.5 MCG/ACT AERS Inhale 2 puffs into the lungs daily. 4 g 11   No current facility-administered medications for this encounter.    Physical Findings: The patient is in no acute distress.   Wt Readings from Last 3 Encounters:  11/05/20 91 lb 4 oz (41.4 kg)  10/12/20 94 lb 6.4 oz (42.8 kg)  09/28/20 93 lb 6 oz (42.4 kg)    height is 5\' 6"  (1.676 m) and weight is 91 lb 4 oz (41.4 kg). His temporal temperature is 97.1 F (36.2 C) (abnormal). His blood pressure is 119/91 (abnormal) and his pulse is 98. His respiration is 18 and oxygen saturation is 100%. .  General: Alert, Conversant, +stridor, Patient's voice is hoarse  HEENT: temporal wasting - Oropharynx is clear. Neck: Neck is supple, no palpable cervical or supraclavicular lymphadenopathy. Heart: Regular in rate and rhythm with no murmurs, rubs, or gallops. Chest: coarse sounds throughout Abdomen: Soft, nontender, nondistended, with no rigidity or guarding. Extremities: No cyanosis or edema. Lymphatics: see Neck Exam Skin: No concerning lesions. Musculoskeletal: muscle atrophy throughout Neurologic: Cranial nerves II through XII are grossly intact. No obvious focalities. Speech is fluent. Coordination is intact. Psychiatric: Affect is appropriate.  PROCEDURE NOTE: After obtaining consent and anesthetizing the nasal cavity with topical lidocaine and phenylephrine, the flexible endoscope was introduced and passed through the nasal cavity.  Significant supraglottic edema and mucosal pooling.  No obvious tumor.  Cannot see true cords due to supraglottic swelling.  No crusting or necrotic tissue visualized. Airway is patent, however.  No lesions otherwise in nasopharynx or pharynx.    Lab  Findings: Lab Results  Component Value Date   WBC 8.3 08/15/2019   HGB 10.6 (L) 08/15/2019   HCT 33.7 (L) 08/15/2019   MCV 97.1 08/15/2019   PLT 255 08/15/2019    Lab Results  Component Value Date   TSH 2.798 05/01/2020    Radiographic Findings: No results found.  Impression/Plan:    1) H+N cancer status: NED with edema in larynx; failure to thrive and medical noncompliance continues. Patient states top 3 symptomatic concerns are:  A) SOB B) Cough C) decreased appetite  He is following with pulmonology.  He did not see palliative medicine as referred; I contacted palliative again to look into this.    To address his symptoms, I will Rx liquid Decadron Taper.  I called his sister Henriette to discuss this.  Continue Omeprazole while on steroid.  We will have a telemedicine visit (Sister, pt, and me) in December to see how his symptoms are at that time).  I instructed patient and sister to stop Vit E and Pentoxifylline - no radionecrosis seen on exam today.  He felt that his breathing was much better after the laryngoscopy nasal spray. He is adamant that  he would like to use a similar spray at home.    I told Henriette that he can try Afrin OTC if steroids don't work after 2-3 weeks, but this is habit forming and will likely cause chronic congestion if he stops it after chronic use.  Thyroid function: check TSH annually: WNL Lab Results  Component Value Date   TSH 2.798 05/01/2020    F/u in December, telemedicine.  Photos from laryngoscopy shared with Dr Constance Holster; he states my exam is stable to his previous one.  On date of encounter, in total, I spent 30 minutes on this encounter - Patient seen in person.    _____________________________________   Eppie Gibson, MD

## 2020-11-05 NOTE — Progress Notes (Signed)
Provided one complimentary case of ensure plus. 

## 2020-11-06 ENCOUNTER — Ambulatory Visit: Payer: Self-pay | Admitting: Radiation Oncology

## 2020-11-06 ENCOUNTER — Telehealth: Payer: Self-pay | Admitting: *Deleted

## 2020-11-06 NOTE — Telephone Encounter (Signed)
CALLED PATIENT'S RELATIVE- [name] NICHOLSON TO ASK ABOUT SETTING UP A TELEMEDICINE VISIT- PATIENT'S RELATIVE AGREED TO 12-14-20 @ 3:30 PM

## 2020-11-08 ENCOUNTER — Encounter: Payer: Self-pay | Admitting: Radiation Oncology

## 2020-11-14 ENCOUNTER — Other Ambulatory Visit: Payer: Self-pay | Admitting: Internal Medicine

## 2020-11-14 ENCOUNTER — Telehealth: Payer: Self-pay

## 2020-11-14 DIAGNOSIS — C321 Malignant neoplasm of supraglottis: Secondary | ICD-10-CM

## 2020-11-14 NOTE — Telephone Encounter (Signed)
Spoke with patient and have scheduled an In-person Consult for 11/19/20 @  1:30  PM.  COVID screening was negative. No pets in home. Patient lives with sister Henriette.   Consent obtained; updated Outlook/Netsmart/Team List and Epic.

## 2020-11-19 ENCOUNTER — Other Ambulatory Visit: Payer: Self-pay

## 2020-11-19 ENCOUNTER — Ambulatory Visit: Payer: Medicaid Other | Admitting: Pulmonary Disease

## 2020-11-19 ENCOUNTER — Other Ambulatory Visit: Payer: Medicaid Other | Admitting: Hospice

## 2020-11-19 ENCOUNTER — Telehealth: Payer: Self-pay | Admitting: *Deleted

## 2020-11-19 DIAGNOSIS — Z515 Encounter for palliative care: Secondary | ICD-10-CM

## 2020-11-19 DIAGNOSIS — C321 Malignant neoplasm of supraglottis: Secondary | ICD-10-CM

## 2020-11-19 NOTE — Progress Notes (Signed)
[facility] Pall[facility] Consult Note Telephone: [phone]  Fax: [phone]  PATIENT NAME: [place] DOB: 09-25-1961 MRN: [id number]  PRIMARY CARE PROVIDER:   Acquanetta Chain, DO [address] [place],  [place] [address]  REFERRING PROVIDER:Dr. Eppie Gibson Responsible Party: Self Emergency Contact: sisterLajoyce Corners Henrrietta [phone]   I met face to face with patient and family in home/facility.  RECOMMENDATIONS/PLAN:    Visit at the request of Dr. Eppie Gibson  for palliative consult. Visit consisted of building trust and discussions on Palliative Medicine as specialized medical care for people living with serious illness, aimed at facilitating better quality of life through symptoms relief, assisting with advance care plan and establishing goals of care.   Discussion on the difference between Palliative and Hospice care. Palliative care and hospice have similar goals of managing symptoms, promoting comfort, improving quality of life, and maintaining a person's dignity. However, palliative care may be offered during any phase of a serious illness, while hospice care is usually offered when a person is expected to live for 6 months or less.  Advance Care Planning: Our advance care planning conversation included a discussion about:    The value and importance of advance care planning  Exploration of personal, cultural or spiritual beliefs that might influence medical decisions  Identification and preparation of a healthcare agent  Review and updating or creation of an  advance directive document  CODE STATUS: Ramifications and implications of CODE STATUS discussed.  Patient affirmed he is a DO NOT RESUSCITATE.  NP signed a DNR form for patient; same document uploaded to epic today.  Goals of Care: Goals of care include to maximize quality of life and symptom management.  Follow up: Palliative care will continue to follow patient  for goals of care clarification and symptom management.  Follow-up in a month/as needed  Functional status/Symptom management: Pain: Patient denied pain during visit; reported that when he takes Tylenol for generalized bidy pain/pain in throat, it is sufficient and effective. Patient reported he has completed chemo infusions for  malignant neoplasm of supraglottis; continue on Dexamethasone solution as ordered by oncologist. Continue appointments for Oncology surveillance. Dyspnea: Dyspnea related to COPD managed with Stiolto Respimat, Duoneb.  Patient continues to smoke.  Education on smoking cessation.  Patient is followed by pulmonologist.  Dysphagia: Patient reports occasional dysphagia related to  malignant neoplasm supraglottis.  Continue chopped/mechanical soft diet. Continue Ensure.  Aspiration precautions discussed.  Palliative will continue to monitor for symptom management/decline and make recommendations as needed.  Family /Caregiver/Community Supports:  I spent 1 hour and 16 minutes providing this consultation; time includes time spent with patient/family, chart review, provider coordination,  and documentation. More than 50% of the time in this consultation was spent on coordinating communication  HISTORY OF PRESENT ILLNESS:  [place] Wall[place] is a 59 y.o. year old male with multiple medical problems including malignant neoplasm of supraglottis, COPD. Palliative Care was asked by Dr. Eppie Gibson to help address goals of care.   CODE STATUS: DO NOT RESUSCITATE  PPS: 50%  HOSPICE ELIGIBILITY/DIAGNOSIS: TBD  PAST MEDICAL HISTORY:  Past Medical History:  Diagnosis Date  . Acute respiratory acidosis 06/29/2019  . Acute respiratory failure with hypoxia ([place]) 06/30/2019  . Arthritis   . COPD (chronic obstructive pulmonary disease) ([place])   . History of radiation therapy 08/17/19- 10/10/19   Larynx treated with 35 fractions of 2 Gy each to total 70 Gy.   Marland Kitchen Neck  mass 08/05/2019    SOCIAL HX:    Social History   Tobacco Use  . Smoking status: Current Every Day Smoker    Packs/day: 0.50    Years: 45.00    Pack years: 22.50    Types: Cigarettes  . Smokeless tobacco: Never Used  . Tobacco comment: he reports smoking 5 cigarettes daily-01/18/20  Substance Use Topics  . Alcohol use: Yes    Comment: one beer weekly.     ALLERGIES:  Allergies  Allergen Reactions  . Fish Allergy Other (See Comments)    Breaks out     PERTINENT MEDICATIONS:  Outpatient Encounter Medications as of 11/19/2020  Medication Sig  . dexamethasone (DECADRON) 0.5 MG/5ML solution Take 40 mL BID through 11/14. On 11/15, taper to 20mL BID. On 11/22, taper to 20mL in the AM only. Continue that until you speak with Dr. Squire. Take with food.  . feeding supplement, ENSURE COMPLETE, (ENSURE COMPLETE) LIQD Take 237 mLs by mouth 3 (three) times daily between meals.  . ipratropium-albuterol (DUONEB) 0.5-2.5 (3) MG/3ML SOLN Take 3 mLs by nebulization in the morning, at noon, and at bedtime.  . Nutritional Supplements (FEEDING SUPPLEMENT, OSMOLITE 1.5 CAL,) LIQD Osmolite 1.5 via G-tube at 65 mL an hour for 11 hours with 180 mL free water before and after continuous feedings.  In addition, give 1 carton Osmolite 1.5 with 60 mL free water before and after bolus feeding via G-tube twice daily.  This provides 1775 cal, 74.5 g protein, 1505 mL free water.  . omeprazole (PRILOSEC) 20 MG capsule Take 2 capsules (40 mg total) by mouth daily.  . Tiotropium Bromide-Olodaterol (STIOLTO RESPIMAT) 2.5-2.5 MCG/ACT AERS Inhale 2 puffs into the lungs daily.   No facility-administered encounter medications on file as of 11/19/2020.    PHYSICAL EXAM/ROS:  General: frail appearing, vocal hoarseness Cardiovascular: regular rate and rhythm; denies chest pain Pulmonary: No increased work of breathing Abdomen: soft, nontender, + bowel sounds GU: no suprapubic tenderness Extremities: no edema, no joint deformities Skin: no rashes to  visible skin Neurological: Weakness but otherwise nonfocal  Livina I Eshiet, NP   

## 2020-11-19 NOTE — Telephone Encounter (Signed)
Attempted to call the pt to check on him since he missed his appt with Dr. Loanne Drilling this morning.  The voicemail was full and I could not leave a message.  I will try and call back later.

## 2020-11-20 ENCOUNTER — Ambulatory Visit (INDEPENDENT_AMBULATORY_CARE_PROVIDER_SITE_OTHER): Payer: Medicaid Other | Admitting: Family Medicine

## 2020-11-20 ENCOUNTER — Encounter: Payer: Self-pay | Admitting: Family Medicine

## 2020-11-20 ENCOUNTER — Other Ambulatory Visit: Payer: Self-pay

## 2020-11-20 VITALS — BP 123/78 | HR 98 | Temp 97.3°F | Resp 22 | Ht 66.0 in | Wt 93.8 lb

## 2020-11-20 DIAGNOSIS — E46 Unspecified protein-calorie malnutrition: Secondary | ICD-10-CM | POA: Diagnosis not present

## 2020-11-20 DIAGNOSIS — K5900 Constipation, unspecified: Secondary | ICD-10-CM

## 2020-11-20 DIAGNOSIS — R636 Underweight: Secondary | ICD-10-CM

## 2020-11-20 DIAGNOSIS — G44209 Tension-type headache, unspecified, not intractable: Secondary | ICD-10-CM | POA: Diagnosis not present

## 2020-11-20 DIAGNOSIS — R5383 Other fatigue: Secondary | ICD-10-CM

## 2020-11-20 MED ORDER — POLYETHYLENE GLYCOL 3350 17 GM/SCOOP PO POWD: 17.0000 g | Freq: Two times a day (BID) | ORAL | 1 refills | Status: DC | PRN

## 2020-11-20 MED ORDER — ACETAMINOPHEN 160 MG/5ML PO LIQD: 15.0000 mg/kg | ORAL | 2 refills | Status: DC | PRN
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
## 2020-11-21 LAB — COMPREHENSIVE METABOLIC PANEL
ALT: 12 IU/L (ref 0–44)
AST: 16 IU/L (ref 0–40)
Albumin/Globulin Ratio: 1 — ABNORMAL LOW (ref 1.2–2.2)
Albumin: 3.6 g/dL — ABNORMAL LOW (ref 3.8–4.9)
Alkaline Phosphatase: 89 IU/L (ref 44–121)
BUN/Creatinine Ratio: 6 — ABNORMAL LOW (ref 9–20)
BUN: 4 mg/dL — ABNORMAL LOW (ref 6–24)
Bilirubin Total: 0.4 mg/dL (ref 0.0–1.2)
CO2: 32 mmol/L — ABNORMAL HIGH (ref 20–29)
Calcium: 9.3 mg/dL (ref 8.7–10.2)
Chloride: 101 mmol/L (ref 96–106)
Creatinine, Ser: 0.62 mg/dL — ABNORMAL LOW (ref 0.76–1.27)
GFR calc Af Amer: 126 mL/min/{1.73_m2} (ref >59)
GFR calc non Af Amer: 109 mL/min/{1.73_m2} (ref >59)
Globulin, Total: 3.5 g/dL (ref 1.5–4.5)
Glucose: 59 mg/dL — ABNORMAL LOW (ref 65–99)
Potassium: 4.6 mmol/L (ref 3.5–5.2)
Sodium: 143 mmol/L (ref 134–144)
Total Protein: 7.1 g/dL (ref 6.0–8.5)

## 2020-11-21 LAB — CBC WITH DIFFERENTIAL/PLATELET
Basophils Absolute: 0 10*3/uL (ref 0.0–0.2)
Basos: 1 %
EOS (ABSOLUTE): 0.2 10*3/uL (ref 0.0–0.4)
Eos: 5 %
Hematocrit: 39.2 % (ref 37.5–51.0)
Hemoglobin: 13 g/dL (ref 13.0–17.7)
Immature Grans (Abs): 0 10*3/uL (ref 0.0–0.1)
Immature Granulocytes: 0 %
Lymphocytes Absolute: 0.9 10*3/uL (ref 0.7–3.1)
Lymphs: 22 %
MCH: 30.9 pg (ref 26.6–33.0)
MCHC: 33.2 g/dL (ref 31.5–35.7)
MCV: 93 fL (ref 79–97)
Monocytes Absolute: 0.6 10*3/uL (ref 0.1–0.9)
Monocytes: 13 %
Neutrophils Absolute: 2.6 10*3/uL (ref 1.4–7.0)
Neutrophils: 59 %
Platelets: 336 10*3/uL (ref 150–450)
RBC: 4.21 x10E6/uL (ref 4.14–5.80)
RDW: 11.8 % (ref 11.6–15.4)
WBC: 4.3 10*3/uL (ref 3.4–10.8)

## 2020-12-13 ENCOUNTER — Telehealth: Payer: Self-pay

## 2020-12-13 NOTE — Telephone Encounter (Signed)
Called patient's sister Henriette to confirm that she would be available for a telephone conference with patient and Dr. Isidore Moos tomorrow for patient's F/U visit. Henriette stated she would be available, but not until after 15:30 because of her work schedule. Informed her I would make Dr. Isidore Moos aware of her time constraint, and to expect a call from Dr. Isidore Moos after 15:30 tomorrow. Henriette verbalized understanding and agreement.

## 2020-12-14 ENCOUNTER — Other Ambulatory Visit: Payer: Self-pay

## 2020-12-14 ENCOUNTER — Ambulatory Visit
Admission: RE | Admit: 2020-12-14 | Discharge: 2020-12-14 | Disposition: A | Discharge status: Home / Self Care | Payer: Medicaid Other | Source: Ambulatory Visit | Attending: Radiation Oncology | Admitting: Radiation Oncology

## 2020-12-14 DIAGNOSIS — C321 Malignant neoplasm of supraglottis: Secondary | ICD-10-CM

## 2020-12-14 NOTE — Progress Notes (Signed)
Radiation Oncology         [phone]) [phone] ________________________________  Name: Perry Waller MRN: [id number]  Date: 12/14/2020  DOB: 01-30-1961  Follow-Up Visit Note by telephone.  The patient opted for telemedicine to maximize safety during the pandemic.  MyChart video was not obtainable.  OUTPATIENT   CC: Pcp, No  No ref. provider found  Diagnosis and Prior Radiotherapy:          ICD-10-CM   1. 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  He says he feels way better, throat pain is much better. Feels like swelling in throat is better.  He is almost out of the steroid medication but has a refill to pick up. He has been taking 56mL QAM.   SOB is better. Cough is better. Appetite is not better.   There were no vitals filed for this visit.   ALLERGIES:  is allergic to fish allergy.  Meds: Current Outpatient Medications  Medication Sig Dispense Refill  . acetaminophen (TYLENOL) 160 MG/5ML liquid Take 19.9 mLs (636.8 mg total) by mouth every 4 (four) hours as needed for fever. 120 mL 2  . dexamethasone (DECADRON) 0.5 MG/5ML solution Take 40 mL BID through 11/14. On 11/15, taper to 65mL BID. On 11/22, taper to 60mL in the AM only. Continue that until you speak with Dr. Isidore Moos. Take with food. 1000 mL 1  . feeding supplement, ENSURE COMPLETE, (ENSURE COMPLETE) LIQD Take 237 mLs by mouth 3 (three) times daily between meals. 21330 mL 0  . ipratropium-albuterol (DUONEB) 0.5-2.5 (3) MG/3ML SOLN Take 3 mLs by nebulization in the morning, at noon, and at bedtime. 360 mL 2  . omeprazole (PRILOSEC) 20 MG capsule Take 2 capsules (40 mg total) by mouth daily. 30 capsule 11  . polyethylene glycol powder  (GLYCOLAX/MIRALAX) 17 GM/SCOOP powder Take 17 g by mouth 2 (two) times daily as needed. 3350 g 1  . Tiotropium Bromide-Olodaterol (STIOLTO RESPIMAT) 2.5-2.5 MCG/ACT AERS Inhale 2 puffs into the lungs daily. 4 g 11   No current facility-administered medications for this encounter.    Physical Findings: The patient is in no acute distress.   Wt Readings from Last 3 Encounters:  11/20/20 93 lb 12.8 oz (42.5 kg)  11/05/20 91 lb 4 oz (41.4 kg)  10/12/20 94 lb 6.4 oz (42.8 kg)    vitals were not taken for this visit. .  General: Alert, Conversant. Patient's voice is hoarse     Lab Findings: Lab Results  Component Value Date   WBC 4.3 11/20/2020   HGB 13.0 11/20/2020   HCT 39.2 11/20/2020   MCV 93 11/20/2020   PLT 336 11/20/2020    Lab Results  Component Value Date   TSH 2.798 05/01/2020    Radiographic Findings: No results found.  Impression/Plan:    This is a very pleasant 59 year old gentleman with a history of medical noncompliance and failure to thrive.  When I saw him in clinic last month he had shortness of breath with stridor, cough, and decreased appetite.  We started him on a dexamethasone taper.  He has done well with this based on his history.  We will continue this taper:  Taper  to 9mL every morning starting tomorrow. Take this until the end of the year.  Starting on January 1st, take 34mL every morning through the end of January. Then, stop.   We will arrange f/u with Dr. Constance Holster in 2 months.   He is following with pulmonology and I recommended that he pick up his refills for his nebulizers/ inhalers.   He has been referred to palliative medicine (has been seen by [place] [facility]).  Thyroid function: check TSH annually: WNL Lab Results  Component Value Date   TSH 2.798 05/01/2020     I will see him back in May 2022.  He is pleased with this plan and knows to call if he has any issues in the interim.  This encounter was provided by telemedicine platform;  patient desired telemedicine during pandemic precautions.  MyChart video was not available so telephone was used. The patient has given verbal consent for this type of encounter and has been advised to only accept a meeting of this type in a secure network environment. On date of service, in total, I spent 30 minutes on this encounter.   The attendants for this meeting include Eppie Gibson  and Tempie Hoist and his Sister Lorna Dibble. During the encounter, Eppie Gibson was located at [facility] Radiation Oncology Department.  Joshia Pangle was located at home.     _____________________________________   Eppie Gibson, MD

## 2020-12-24 ENCOUNTER — Other Ambulatory Visit: Payer: Self-pay

## 2020-12-24 ENCOUNTER — Other Ambulatory Visit: Payer: Medicaid Other | Admitting: Hospice

## 2020-12-24 DIAGNOSIS — C321 Malignant neoplasm of supraglottis: Secondary | ICD-10-CM

## 2020-12-24 DIAGNOSIS — Z515 Encounter for palliative care: Secondary | ICD-10-CM

## 2020-12-24 NOTE — Progress Notes (Signed)
[place] Consult Note Telephone: [phone]  Fax: [phone]  PATIENT NAME: Perry Waller DOB: 11-06-1961 MRN: [id number]  REFERRING PROVIDER:Dr. Eppie Gibson Responsible Party: Self/Sister Emergency Contact: sisterLajoyce Corners Henrrietta [phone]   I met face to face with patient and family in home/facility.  RECOMMENDATIONS/PLAN:    Visit consisted of building trust and discussions on Palliative Medicine as specialized medical care for people living with serious illness, aimed at facilitating better quality of life through symptoms relief, assisting with advance care plan and establishing goals of care. Visit consisted of counseling and education dealing with the complex and emotionally intense issues of symptom management and palliative care in the setting of serious and potentially life-threatening illness. Palliative care team will continue to support patient, patient's family, and medical team. Advance Care Planning/CODE STATUS:  CODE STATUS reviewed today. Discussions with patient and later in the evening with Perry Waller on implications and ramifications of CODE STATUS.  Patient affirmed that he is a DO NOT RESUSCITATE. Goals of Care: Goals of care include to maximize quality of life and symptom management.  Follow up: Palliative care will continue to follow patient for goals of care clarification and symptom management.  Follow-up in 5 weeks per patient's request, for further discussion on goals of care clarification  Functional status/Symptom management: Pain: Patient denied pain during visit; continue Tylenol for generalized body pain/pain in throat; effective.  Patient had completed chemo infusions for  malignant neoplasm of supraglottis; he continues on Dexamethasone solution as ordered by oncologist. Continue appointments for Oncology surveillance. Dyspnea: Dyspnea related to COPD managed with Stiolto Respimat, Duoneb.   Patient continues to smoke.  Education on smoking cessation.  Patient is followed by pulmonologist.  Dysphagia: Patient reports occasional dysphagia related to  malignant neoplasm supraglottis.  NP reiterated aspiration precautions and encouraged chopped/mechanical soft diet. Continue Ensure.  Patient denies choking at this time. Palliative will continue to monitor for symptom management/decline and make recommendations as needed.  Family /Caregiver/Community Supports: I spent 1 hour and 16 minutes providing this consultation; time includes time spent with patient/family, chart review, provider coordination,  and documentation. More than 50% of the time in this consultation was spent on coordinating communication  HISTORY OF PRESENT ILLNESS:  [name] is a 59 y.o. year old male with multiple medical problems including malignant neoplasm of supraglottis, COPD. Palliative Care was asked by Dr. Eppie Gibson to help address goals of care.   CODE STATUS: DO NOT RESUSCITATE  PPS: 50%  HOSPICE ELIGIBILITY/DIAGNOSIS: TBD  PAST MEDICAL HISTORY:  Past Medical History:  Diagnosis Date  . Acute respiratory acidosis 06/29/2019  . Acute respiratory failure with hypoxia ([place]) 06/30/2019  . Arthritis   . COPD (chronic obstructive pulmonary disease) ([place])   . History of radiation therapy 08/17/19- 10/10/19   Larynx treated with 35 fractions of 2 Gy each to total 70 Gy.   Marland Kitchen Neck mass 08/05/2019    SOCIAL HX:  Social History   Tobacco Use  . Smoking status: Current Every Day Smoker    Packs/day: 0.50    Years: 45.00    Pack years: 22.50    Types: Cigarettes  . Smokeless tobacco: Never Used  . Tobacco comment: he reports smoking 5 cigarettes daily-01/18/20  Substance Use Topics  . Alcohol use: Yes    Comment: one beer weekly.     ALLERGIES:  Allergies  Allergen Reactions  . Fish Allergy Other (See Comments)    Breaks out  PERTINENT MEDICATIONS:  Outpatient Encounter Medications as of  12/24/2020  Medication Sig  . acetaminophen (TYLENOL) 160 MG/5ML liquid Take 19.9 mLs (636.8 mg total) by mouth every 4 (four) hours as needed for fever.  Marland Kitchen dexamethasone (DECADRON) 0.5 MG/5ML solution Take 40 mL BID through 11/14. On 11/15, taper to 46m BID. On 11/22, taper to 259min the AM only. Continue that until you speak with Dr. SqIsidore MoosTake with food.  . feeding supplement, ENSURE COMPLETE, (ENSURE COMPLETE) LIQD Take 237 mLs by mouth 3 (three) times daily between meals.  . Marland Kitchenpratropium-albuterol (DUONEB) 0.5-2.5 (3) MG/3ML SOLN Take 3 mLs by nebulization in the morning, at noon, and at bedtime.  . Marland Kitchenmeprazole (PRILOSEC) 20 MG capsule Take 2 capsules (40 mg total) by mouth daily.  . polyethylene glycol powder (GLYCOLAX/MIRALAX) 17 GM/SCOOP powder Take 17 g by mouth 2 (two) times daily as needed.  . Tiotropium Bromide-Olodaterol (STIOLTO RESPIMAT) 2.5-2.5 MCG/ACT AERS Inhale 2 puffs into the lungs daily.   No facility-administered encounter medications on file as of 12/24/2020.    PHYSICAL EXAM/ROS  General: NAD, frail appearing, vocal hoarseness Cardiovascular: regular rate and rhythm; denies chest pain Pulmonary: Diminished in bilateral bases, no increased work of breathing, denies dyspnea.  Oxygen saturation 96% room air Abdomen: soft, nontender, + bowel sounds GU: no suprapubic tenderness Extremities: no edema, no joint deformities Skin: no rashes to visible skin Neurological: Weakness but otherwise nonfocal  Note:  Portions of this note were generated with Dragon dictation software. Dictation errors may occur despite attempts at proofreading.  LiTeodoro SprayNP

## 2021-01-31 ENCOUNTER — Other Ambulatory Visit: Payer: Self-pay

## 2021-01-31 ENCOUNTER — Other Ambulatory Visit: Payer: Medicaid Other | Admitting: Hospice

## 2021-01-31 DIAGNOSIS — C321 Malignant neoplasm of supraglottis: Secondary | ICD-10-CM

## 2021-01-31 DIAGNOSIS — R1310 Dysphagia, unspecified: Secondary | ICD-10-CM

## 2021-01-31 DIAGNOSIS — Z515 Encounter for palliative care: Secondary | ICD-10-CM

## 2021-01-31 NOTE — Progress Notes (Signed)
[facility] Consult Note Telephone: [phone]  Fax: [phone]  PATIENT NAME: Perry Waller DOB: [birth year] MRN: [id number]   REFERRING PROVIDER:Dr. Eppie Gibson Responsible Party: Self/Sister -Perry Waller Emergency Contact: sisterLajoyce Corners Waller [phone]     Visit is to build trust and highlight Palliative Medicine as specialized medical care for people living with serious illness, aimed at facilitating better quality of life through symptoms relief, assisting with advance care plan and establishing goals of care.  NP called and spoke with [facility] updating her on visit.  She expressed appreciation for the update.  CHIEF COMPLAINT: Follow up palliative visit/Dysphagia  RECOMMENDATIONS/PLAN:   1. Advance Care Planning/Code Status: Code status reviewed.  Patient is a DO NOT RESUSCITATE.  2. Goals of Care: Goals of care include to maximize quality of life and symptom management.  Perry Waller explained that patient will not be getting chemo or radiation because his body cannot handle it.  Therapeutic listening provided.  Patient wanted to know when his next cancer center appointment was.  NP called Dr. Pearlie Oyster office and left a message to call [facility] with any forthcoming appointment.  Visit consisted of counseling and education dealing with the complex and emotionally intense issues of symptom management and palliative care in the setting of serious and potentially life-threatening illness. Palliative care team will continue to support patient, patient's family, and medical team. I spent 20 minutes providing this consultation. More than 50% of the time in this consultation was spent on coordinating communication.  -------------------------------------------------------------------------------------------------------------------------------------------------- 3. 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  Continue Dexamethasone oral solution.  Aspiration precautions discussed.  Continue chopped/mechanical soft diet.Continue Ensure. Speech therapist evaluation recommended. Palliativewill continue to monitor for symptom management/decline and make recommendations as needed.  Palliative will continue to monitor for symptom management/decline and make recommendations as needed. Return 2 months or prn. Encouraged to call provider sooner with any concerns.   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 He reports  Difficulty/pain with swallowing worsened with regular hard texture or fried food but soft foods are easier for him.  He reported that sitting up during eating also helps.  Difficulty swallowing makes him prefer to drink his Ensure and to eat regular food.  Patient with hx of COPD, arthritis. Palliative Care was askedby Dr. Eppie Gibson to help address goals of care and complex care decision. Marland Kitchen Rest of 10 point ROS asked and negative.  CODE STATUS: DNR  PPS:  40%  HOSPICE ELIGIBILITY/DIAGNOSIS: TBD  PAST MEDICAL HISTORY:  Past Medical History:  Diagnosis Date  . Acute respiratory acidosis 06/29/2019  . Acute respiratory failure with hypoxia ([place]) 06/30/2019  . Arthritis   . COPD (chronic obstructive pulmonary disease) ([place])   . History of radiation therapy 08/17/19- 10/10/19   Larynx treated with 35 fractions of 2 Gy each to total 70 Gy.   Marland Kitchen Neck mass 08/05/2019    SOCIAL HX: Patient lives at home with his sister [name]  Labs:   No results for input(s): WBC, HGB, HCT, PLT, MCV in the last 168 hours. No results for input(s): NA, K, CL, CO2, BUN, CREATININE, GLUCOSE in the last 168 hours. Latest GFR by Cockcroft Gault (not valid in AKI or ESRD) CrCl cannot be calculated (Patient's most recent lab result is older  than the maximum 21 days allowed.). No results for input(s): AST, ALT,  ALKPHOS, GGT in the last 168 hours.  Invalid input(s): TBILI, CONJBILI, ALB, TOTALPROTEIN No components found for: ALB No results for input(s): APTT, INR in the last 168 hours.  Invalid input(s): PTPATIENT No results for input(s): BNP, PROBNP in the last 168 hours.  ALLERGIES:  Allergies  Allergen Reactions  . Fish Allergy Other (See Comments)    Breaks out      PERTINENT MEDICATIONS:  Outpatient Encounter Medications as of 01/31/2021  Medication Sig  . acetaminophen (TYLENOL) 160 MG/5ML liquid Take 19.9 mLs (636.8 mg total) by mouth every 4 (four) hours as needed for fever.  Marland Kitchen dexamethasone (DECADRON) 0.5 MG/5ML solution Take 40 mL BID through 11/14. On 11/15, taper to 79mL BID. On 11/22, taper to 88mL in the AM only. Continue that until you speak with Dr. Isidore Moos. Take with food.  . feeding supplement, ENSURE COMPLETE, (ENSURE COMPLETE) LIQD Take 237 mLs by mouth 3 (three) times daily between meals.  Marland Kitchen ipratropium-albuterol (DUONEB) 0.5-2.5 (3) MG/3ML SOLN Take 3 mLs by nebulization in the morning, at noon, and at bedtime.  Marland Kitchen omeprazole (PRILOSEC) 20 MG capsule Take 2 capsules (40 mg total) by mouth daily.  . polyethylene glycol powder (GLYCOLAX/MIRALAX) 17 GM/SCOOP powder Take 17 g by mouth 2 (two) times daily as needed.  . Tiotropium Bromide-Olodaterol (STIOLTO RESPIMAT) 2.5-2.5 MCG/ACT AERS Inhale 2 puffs into the lungs daily.   No facility-administered encounter medications on file as of 01/31/2021.     PHYSICAL EXAM  Constitutional: In no acute distress Cardiovascular: regular rate and rhythm Pulmonary: no cough, no increased work of breathing, normal respiratory effort, 98% on room air Abdomen: soft, non tender, positive bowel sounds in all quadrants GU:  no suprapubic tenderness Eyes: Normal lids, no discharge, sclera anicteric ENMT: Moist mucous membranes Musculoskeletal:  no edema in BLE Skin: no  rash to visible skin, warm without cyanosis Psych: non-anxious affect Neurological: Weakness but otherwise non focal Heme/lymph/immuno: no bruises, no bleeding  Palliative Care was asked to follow this patient by consultation request of Dr. Eppie Gibson to help address advance care planning and complex decision making. Thank you for the opportunity to participate in the care of Lottie Siska Please call our office at [phone] if we can be of additional assistance.  Note: Portions of this note were generated with L[employer]. Dictation errors may occur despite best attempts at proofreading.  Teodoro Spray, NP

## 2021-05-07 ENCOUNTER — Ambulatory Visit: Payer: Medicaid Other | Attending: Radiation Oncology | Admitting: Radiation Oncology

## 2021-05-07 ENCOUNTER — Telehealth: Payer: Self-pay | Admitting: *Deleted

## 2021-05-07 NOTE — Telephone Encounter (Signed)
CALLED PATIENT TO CHECK TO SEE IF HE IS COMING TODAY, UNABLE TO LEAVE MESSAGE DUE TO VM BEING FULL

## 2021-05-20 ENCOUNTER — Encounter (HOSPITAL_COMMUNITY): Payer: Self-pay | Admitting: Dietician

## 2021-05-20 ENCOUNTER — Telehealth: Payer: Self-pay

## 2021-05-20 ENCOUNTER — Telehealth: Payer: Self-pay | Admitting: *Deleted

## 2021-05-20 ENCOUNTER — Other Ambulatory Visit: Payer: Self-pay

## 2021-05-20 ENCOUNTER — Ambulatory Visit
Admission: RE | Admit: 2021-05-20 | Discharge: 2021-05-20 | Disposition: A | Discharge status: Home / Self Care | Payer: Medicaid Other | Source: Ambulatory Visit | Attending: Radiation Oncology | Admitting: Radiation Oncology

## 2021-05-20 ENCOUNTER — Encounter: Payer: Self-pay | Admitting: *Deleted

## 2021-05-20 VITALS — BP 87/69 | HR 96 | Temp 96.9°F | Resp 22 | Ht 66.0 in | Wt 83.4 lb

## 2021-05-20 DIAGNOSIS — Z1329 Encounter for screening for other suspected endocrine disorder: Secondary | ICD-10-CM

## 2021-05-20 DIAGNOSIS — C321 Malignant neoplasm of supraglottis: Secondary | ICD-10-CM | POA: Insufficient documentation

## 2021-05-20 LAB — T4, FREE: Free T4: 0.8 ng/dL (ref 0.61–1.12)

## 2021-05-20 LAB — TSH: TSH: 4.439 u[IU]/mL — ABNORMAL HIGH (ref 0.320–4.118)

## 2021-05-20 MED ORDER — OXYMETAZOLINE HCL 0.05 % NA SOLN
2.0000 | Freq: Once | NASAL | Status: AC
  Administered 2021-05-20: 2 via NASAL
  Filled 2021-05-20: qty 30

## 2021-05-20 MED ORDER — DEXAMETHASONE 0.5 MG/5ML PO SOLN: ORAL | 0 refills | Status: DC

## 2021-05-20 NOTE — Progress Notes (Signed)
[place] Work  Clinical Social Work attempted to contact patients sister to discuss Publishing rights manager (PCS).  Voicemail box was full and CSW unable to leave a message.  CSW will attempt to contact at another time.   Johnnye Lana, MSW, LCSW, OSW-C Clinical Social Worker [facility] [phone]

## 2021-05-20 NOTE — Progress Notes (Signed)
Nutrition  Provided one complimentary case of Ensure Plus.

## 2021-05-20 NOTE — Telephone Encounter (Signed)
After patient's follow-up with Dr. Isidore Moos she advised that patient been seen by Dr. Constance Holster as soon as possible. She feels his airway needs to be evaluated, and discuss possibly re-traching patient to ease work of breathing. Left VM on Dr. Janeice Robinson assistant's phone with above information. Also called practice's on-call line, and was told patient can been seen by Dr. Constance Holster this Friday 05/24/21 at 14:50. Will update Dr. Isidore Moos and our H&N navigator to see if transportation can be arranged since patient's sister will not be able to bring to an appointment before 3pm.

## 2021-05-20 NOTE — Telephone Encounter (Signed)
CALLED PATIENT TO ASK ABOUT SCHEDULING A 4 MONTH FU, SPOKE WITH PATIENT'S SISTER- [place] AGREED FOR HIM TO SEE THE DOCTOR ON 09-24-21 @ 2:20 PM

## 2021-05-20 NOTE — Progress Notes (Signed)
[name] presents for follow up of radiationto his larynxcompletedon10/11/2019.  Pain issues, if any: Reports generalized discomfort/achiness Using a feeding tube?: N/A Weight changes, if any: Wt Readings from Last 3 Encounters:  05/20/21 83 lb 6 oz (37.8 kg)  11/20/20 93 lb 12.8 oz (42.5 kg)  11/05/20 91 lb 4 oz (41.4 kg)   Swallowing issues, if any: Yes--continues to struggle with solid foods. States he can tolerate liquids/soups, but often coughs afterwards (which sometimes causes him to vomit) Smoking or chewing tobacco? Reports he quit last week Using fluoride trays daily? N/A Last ENT visit was on: Not since 08/07/2020 when he saw Dr. Izora Gala Other notable issues, if any: Patient reports his energy and stamina are greatly decreased. He is interested in a SNF placement to help with care management. States he coughed hard in the shower ~3 days ago, and some phlegm came out of old trach site. Continues to have vocal hoarseness.   Vitals:   05/20/21 1112  BP: (!) 87/69  Pulse: 96  Resp: (!) 22  Temp: (!) 96.9 F (36.1 C)  SpO2: 100%

## 2021-05-21 ENCOUNTER — Encounter: Payer: Self-pay | Admitting: *Deleted

## 2021-05-21 NOTE — Progress Notes (Signed)
Oncology Nurse Navigator Documentation  I spoke with Mr. Debski sister Henriette just now and informed her that Mr. Kapur has been scheduled to see ENT Dr. Constance Holster on 5/27 at 2:50 pm. I also let her know that [facility] transportation will be able to transport him there for that appointment. I made her aware that this will be the only time that [facility] would be able to transport him to Dr. Janeice Robinson office and if/when he is scheduled there again they will need to arrange their own transportation. She voiced her appreciation and understanding. She knows to call me with any further questions or concerns.  Harlow Asa RN, BSN, OCN Head & Neck Oncology Nurse [place] [facility] Phone # [phone]  Fax # [phone]

## 2021-05-21 NOTE — Progress Notes (Signed)
Radiation Oncology         [id number]) [phone] ________________________________  Name: Perry Waller MRN: [id number]  Date: 05/20/2021  DOB: April 22, 1961  Follow-Up Visit in person OUTPATIENT   CC: Pcp, No  Jegede, Olugbemiga E, MD  Diagnosis and Prior Radiotherapy:          ICD-10-CM   1. Malignant neoplasm of supraglottis (HCC)  C32.1 oxymetazoline (AFRIN) 0.05 % nasal spray 2 spray    Fiberoptic laryngoscopy    dexamethasone (DECADRON) 0.5 MG/5ML solution   Cancer Staging Malignant neoplasm of supraglottis ([place]) Staging form: Larynx - Supraglottis, AJCC 8th Edition - Clinical stage from 08/09/2019: Stage IVA (cT4a, cN2c, cM0) - Signed by Perry Gibson, MD on 08/10/2019 Stage prefix: Initial diagnosis   08/17/2019 through 10/10/2019 Site Technique Total Dose (Gy) Dose per Fx (Gy) Completed Fx Beam Energies  Head & neck: HN_larynx IMRT 70/70 2 35/35 6X    CHIEF COMPLAINT:  Here for follow-up and surveillance of laryngeal cancer  NARRATIVE: The patient showed up in our lobby unexpectedly today.  He was a no-show earlier this month for scheduled appointment.  We worked him into my clinic given his challenges with transportation and tendency to get confused about appointments.  Perry Waller presents for follow up of radiationto his larynxcompletedon10/11/2019.  Pain issues, if any: Reports generalized discomfort/achiness Using a feeding tube?: N/A Weight changes, if any: Wt Readings from Last 3 Encounters:  05/20/21 83 lb 6 oz (37.8 kg)  11/20/20 93 lb 12.8 oz (42.5 kg)  11/05/20 91 lb 4 oz (41.4 kg)   Swallowing issues, if any: Yes--continues to struggle with solid foods. States he can tolerate liquids/soups, but often coughs afterwards (which sometimes causes him to vomit) Smoking or chewing tobacco? Reports he quit last week Using fluoride trays daily? N/A Last ENT visit was on: Not since 08/07/2020 when he saw Perry Waller Other notable issues, if any: Patient reports his energy  and stamina are greatly decreased. He is interested in a SNF placement to help with care management. States he coughed hard in the shower ~3 days ago, and some phlegm came out of old trach site. Continues to have vocal hoarseness.   He also has more difficulty breathing.  He states that he sees a pulmonologist this week.  He is no longer taking dexamethasone but this had helped his breathing in the past when I found laryngeal edema on his exam    Vitals:   05/20/21 1112  BP: (!) 87/69  Pulse: 96  Resp: (!) 22  Temp: (!) 96.9 F (36.1 C)  SpO2: 100%     ALLERGIES:  is allergic to fish allergy.  Meds: Current Outpatient Medications  Medication Sig Dispense Refill  . acetaminophen (TYLENOL) 160 MG/5ML liquid Take 19.9 mLs (636.8 mg total) by mouth every 4 (four) hours as needed for fever. 120 mL 2  . dexamethasone (DECADRON) 0.5 MG/5ML solution Take 40 mL BID through 5/27. On 5/28, taper to 61mL BID. On 6/4, taper to 59mL in the AM only. On 6/11, taper to 23mL in the AM only. Continue through 6/17. Take w/ food. 1000 mL 0  . feeding supplement, ENSURE COMPLETE, (ENSURE COMPLETE) LIQD Take 237 mLs by mouth 3 (three) times daily between meals. 21330 mL 0  . ipratropium-albuterol (DUONEB) 0.5-2.5 (3) MG/3ML SOLN Take 3 mLs by nebulization in the morning, at noon, and at bedtime. 360 mL 2  . omeprazole (PRILOSEC) 20 MG capsule Take 2 capsules (40 mg total) by mouth daily.  30 capsule 11  . polyethylene glycol powder (GLYCOLAX/MIRALAX) 17 GM/SCOOP powder Take 17 g by mouth 2 (two) times daily as needed. 3350 g 1  . Tiotropium Bromide-Olodaterol (STIOLTO RESPIMAT) 2.5-2.5 MCG/ACT AERS Inhale 2 puffs into the lungs daily. 4 g 11   No current facility-administered medications for this encounter.    Physical Findings: The patient is in no acute distress.   Wt Readings from Last 3 Encounters:  05/20/21 83 lb 6 oz (37.8 kg)  11/20/20 93 lb 12.8 oz (42.5 kg)  11/05/20 91 lb 4 oz (41.4 kg)     height is 5\' 6"  (1.676 m) and weight is 83 lb 6 oz (37.8 kg). His temporal temperature is 96.9 F (36.1 C) (abnormal). His blood pressure is 87/69 (abnormal) and his pulse is 96. His respiration is 22 (abnormal) and oxygen saturation is 100%. .  General: Alert and oriented, demonstrating dyspnea/stridor.  In a wheelchair.  Cachectic and hoarse HEENT: Temporal wasting. Oropharynx is clear. Neck: Neck is supple, no palpable cervical or supraclavicular lymphadenopathy.  Stomal scar Heart: Regular in rate and rhythm with no murmurs, rubs, or gallops. Chest: Upper airway stridor transmits into the chest  Lymphatics: see Neck Exam Skin: No concerning lesions. Musculoskeletal: Diffuse muscle wasting   PROCEDURE NOTE: After obtaining consent and anesthetizing the nasal cavity with topical oxymetazoline, the flexible endoscope was introduced and passed through the nasal cavity.  The nasopharynx, oropharynx, larynx, and hypopharynx were then examined.  He does not demonstrate diffuse supraglottic edema as before, but much of the laryngeal tissue seems to obstruct the airway.  The etiology of this is not clear.  The airway is stenotic but not obstructed.  There appears to be yellow brown crusting  In the airway:  See photos      Lab Findings: Lab Results  Component Value Date   WBC 4.3 11/20/2020   HGB 13.0 11/20/2020   HCT 39.2 11/20/2020   MCV 93 11/20/2020   PLT 336 11/20/2020    Lab Results  Component Value Date   TSH 4.439 (H) 05/20/2021    Radiographic Findings: No results found.  Impression/Plan:    This is a very pleasant 60 year old gentleman with a history of  failure to thrive and locally advanced laryngeal cancer.     We will contact Perry Waller clinic and ask for him to be worked in ASAP due to his stridor and stenotic airway.  The patient understands that he may need to have a trach reinserted.  He knows to go to the emergency room if his symptoms worsen before he sees Dr.  Constance Waller.  Patient will follow-up with pulmonology as scheduled in case he also has issues simultaneously with his lungs.  I gave him a dexamethasone taper again in case this helps improve his airway and breathing  Social work will get involved to help him again with his multiple social stressors and desire for assistance at home or in a facility, if appropriate  Thyroid function: check TSH annually: Today TSH was slightly elevated but free T4 was 0.8, normal.  Continue to follow Lab Results  Component Value Date   TSH 4.439 (H) 05/20/2021    Weight loss: A case of nutritional supplements given to the patient today.    I will see him back in  14mo.  He is pleased with this plan and knows to call if he has any issues in the interim.  On date of service, in total, I spent 45 minutes on this  encounter. Patient was seen in person.    _____________________________________   Perry Gibson, MD

## 2021-05-21 NOTE — Progress Notes (Signed)
[place] Work  Clinical Social Work contacted patients sister at home to offer support and discuss resources.  Patient sister stated patient was "capable of taking care of himself", but would benefit from additional support.  CSW and patients sister discussed [place] (PCS) through patients medicaid.  Patients sister agreed to [facility] request.  CSW shared request with [facility] treatment team.    Johnnye Lana, MSW, LCSW, OSW-C Clinical Social Worker [place] [phone]

## 2021-05-22 ENCOUNTER — Encounter: Payer: Self-pay | Admitting: *Deleted

## 2021-05-22 NOTE — Progress Notes (Signed)
[place] Work  Clinical Social Work provided Therapist, sports with [employer] form.  Once medical information is documented and signed by provider PCS form can be faxed to [facility].    Johnnye Lana, MSW, LCSW, OSW-C Clinical Social Worker [facility] [phone]

## 2021-05-24 ENCOUNTER — Emergency Department (HOSPITAL_COMMUNITY): Payer: Medicaid Other

## 2021-05-24 ENCOUNTER — Encounter (HOSPITAL_COMMUNITY): Payer: Self-pay | Admitting: Internal Medicine

## 2021-05-24 ENCOUNTER — Other Ambulatory Visit: Payer: Self-pay

## 2021-05-24 ENCOUNTER — Encounter: Payer: Self-pay | Admitting: Licensed Clinical Social Worker

## 2021-05-24 ENCOUNTER — Inpatient Hospital Stay (HOSPITAL_COMMUNITY)
Admission: EM | Admit: 2021-05-24 | Discharge: 2021-06-13 | DRG: 166 | Disposition: A | Discharge status: Home Health | Payer: Medicaid Other | Attending: Internal Medicine | Admitting: Internal Medicine

## 2021-05-24 DIAGNOSIS — Z7952 Long term (current) use of systemic steroids: Secondary | ICD-10-CM

## 2021-05-24 DIAGNOSIS — J9503 Malfunction of tracheostomy stoma: Secondary | ICD-10-CM | POA: Diagnosis not present

## 2021-05-24 DIAGNOSIS — Z923 Personal history of irradiation: Secondary | ICD-10-CM | POA: Diagnosis not present

## 2021-05-24 DIAGNOSIS — E860 Dehydration: Secondary | ICD-10-CM | POA: Diagnosis present

## 2021-05-24 DIAGNOSIS — R1310 Dysphagia, unspecified: Secondary | ICD-10-CM | POA: Diagnosis present

## 2021-05-24 DIAGNOSIS — Z515 Encounter for palliative care: Secondary | ICD-10-CM | POA: Diagnosis not present

## 2021-05-24 DIAGNOSIS — B964 Proteus (mirabilis) (morganii) as the cause of diseases classified elsewhere: Secondary | ICD-10-CM | POA: Diagnosis present

## 2021-05-24 DIAGNOSIS — Z720 Tobacco use: Secondary | ICD-10-CM | POA: Diagnosis present

## 2021-05-24 DIAGNOSIS — E876 Hypokalemia: Secondary | ICD-10-CM | POA: Diagnosis present

## 2021-05-24 DIAGNOSIS — Z8521 Personal history of malignant neoplasm of larynx: Secondary | ICD-10-CM

## 2021-05-24 DIAGNOSIS — R64 Cachexia: Secondary | ICD-10-CM | POA: Diagnosis not present

## 2021-05-24 DIAGNOSIS — E43 Unspecified severe protein-calorie malnutrition: Secondary | ICD-10-CM | POA: Diagnosis present

## 2021-05-24 DIAGNOSIS — Z809 Family history of malignant neoplasm, unspecified: Secondary | ICD-10-CM

## 2021-05-24 DIAGNOSIS — Z20822 Contact with and (suspected) exposure to covid-19: Secondary | ICD-10-CM | POA: Diagnosis not present

## 2021-05-24 DIAGNOSIS — D649 Anemia, unspecified: Secondary | ICD-10-CM | POA: Diagnosis not present

## 2021-05-24 DIAGNOSIS — Z91013 Allergy to seafood: Secondary | ICD-10-CM

## 2021-05-24 DIAGNOSIS — R7989 Other specified abnormal findings of blood chemistry: Secondary | ICD-10-CM | POA: Diagnosis present

## 2021-05-24 DIAGNOSIS — Y842 Radiological procedure and radiotherapy as the cause of abnormal reaction of the patient, or of later complication, without mention of misadventure at the time of the procedure: Secondary | ICD-10-CM | POA: Diagnosis present

## 2021-05-24 DIAGNOSIS — Z66 Do not resuscitate: Secondary | ICD-10-CM | POA: Diagnosis present

## 2021-05-24 DIAGNOSIS — N390 Urinary tract infection, site not specified: Secondary | ICD-10-CM | POA: Diagnosis not present

## 2021-05-24 DIAGNOSIS — Z9221 Personal history of antineoplastic chemotherapy: Secondary | ICD-10-CM

## 2021-05-24 DIAGNOSIS — D638 Anemia in other chronic diseases classified elsewhere: Secondary | ICD-10-CM | POA: Diagnosis not present

## 2021-05-24 DIAGNOSIS — A419 Sepsis, unspecified organism: Secondary | ICD-10-CM

## 2021-05-24 DIAGNOSIS — R609 Edema, unspecified: Secondary | ICD-10-CM | POA: Diagnosis present

## 2021-05-24 DIAGNOSIS — F17210 Nicotine dependence, cigarettes, uncomplicated: Secondary | ICD-10-CM | POA: Diagnosis present

## 2021-05-24 DIAGNOSIS — J9509 Other tracheostomy complication: Secondary | ICD-10-CM | POA: Diagnosis not present

## 2021-05-24 DIAGNOSIS — Z681 Body mass index (BMI) 19 or less, adult: Secondary | ICD-10-CM

## 2021-05-24 DIAGNOSIS — Z930 Tracheostomy status: Secondary | ICD-10-CM

## 2021-05-24 DIAGNOSIS — J383 Other diseases of vocal cords: Secondary | ICD-10-CM | POA: Diagnosis not present

## 2021-05-24 DIAGNOSIS — J9690 Respiratory failure, unspecified, unspecified whether with hypoxia or hypercapnia: Secondary | ICD-10-CM | POA: Diagnosis not present

## 2021-05-24 DIAGNOSIS — J449 Chronic obstructive pulmonary disease, unspecified: Secondary | ICD-10-CM | POA: Diagnosis present

## 2021-05-24 DIAGNOSIS — Y833 Surgical operation with formation of external stoma as the cause of abnormal reaction of the patient, or of later complication, without mention of misadventure at the time of the procedure: Secondary | ICD-10-CM | POA: Diagnosis present

## 2021-05-24 DIAGNOSIS — J690 Pneumonitis due to inhalation of food and vomit: Secondary | ICD-10-CM | POA: Diagnosis not present

## 2021-05-24 DIAGNOSIS — R061 Stridor: Secondary | ICD-10-CM | POA: Diagnosis present

## 2021-05-24 DIAGNOSIS — Z79899 Other long term (current) drug therapy: Secondary | ICD-10-CM | POA: Diagnosis not present

## 2021-05-24 DIAGNOSIS — R634 Abnormal weight loss: Secondary | ICD-10-CM | POA: Diagnosis not present

## 2021-05-24 DIAGNOSIS — I493 Ventricular premature depolarization: Secondary | ICD-10-CM | POA: Diagnosis present

## 2021-05-24 DIAGNOSIS — R531 Weakness: Secondary | ICD-10-CM | POA: Diagnosis not present

## 2021-05-24 DIAGNOSIS — Z01818 Encounter for other preprocedural examination: Secondary | ICD-10-CM | POA: Diagnosis not present

## 2021-05-24 HISTORY — DX: Anemia, unspecified: D64.9

## 2021-05-24 LAB — COMPREHENSIVE METABOLIC PANEL
ALT: 23 U/L (ref 0–44)
AST: 34 U/L (ref 15–41)
Albumin: 2.7 g/dL — ABNORMAL LOW (ref 3.5–5.0)
Alkaline Phosphatase: 57 U/L (ref 38–126)
Anion gap: 12 (ref 5–15)
BUN: 19 mg/dL (ref 6–20)
CO2: 32 mmol/L (ref 22–32)
Calcium: 9.1 mg/dL (ref 8.9–10.3)
Chloride: 92 mmol/L — ABNORMAL LOW (ref 98–111)
Creatinine, Ser: 0.51 mg/dL — ABNORMAL LOW (ref 0.61–1.24)
GFR, Estimated: >60 mL/min (ref >60)
Glucose, Bld: 89 mg/dL (ref 70–99)
Potassium: 4.5 mmol/L (ref 3.5–5.1)
Sodium: 136 mmol/L (ref 135–145)
Total Bilirubin: 1.1 mg/dL (ref 0.3–1.2)
Total Protein: 7.1 g/dL (ref 6.5–8.1)

## 2021-05-24 LAB — RESP PANEL BY RT-PCR (FLU A&B, COVID) ARPGX2
Influenza A by PCR: NEGATIVE
Influenza B by PCR: NEGATIVE
SARS Coronavirus 2 by RT PCR: NEGATIVE

## 2021-05-24 LAB — CBC WITH DIFFERENTIAL/PLATELET
Abs Immature Granulocytes: 0.03 10*3/uL (ref 0.00–0.07)
Basophils Absolute: 0.1 10*3/uL (ref 0.0–0.1)
Basophils Relative: 1 %
Eosinophils Absolute: 0.1 10*3/uL (ref 0.0–0.5)
Eosinophils Relative: 1 %
HCT: 32.2 % — ABNORMAL LOW (ref 39.0–52.0)
Hemoglobin: 9.8 g/dL — ABNORMAL LOW (ref 13.0–17.0)
Immature Granulocytes: 0 %
Lymphocytes Relative: 10 %
Lymphs Abs: 0.7 10*3/uL (ref 0.7–4.0)
MCH: 28.3 pg (ref 26.0–34.0)
MCHC: 30.4 g/dL (ref 30.0–36.0)
MCV: 93.1 fL (ref 80.0–100.0)
Monocytes Absolute: 0.9 10*3/uL (ref 0.1–1.0)
Monocytes Relative: 12 %
Neutro Abs: 5.5 10*3/uL (ref 1.7–7.7)
Neutrophils Relative %: 76 %
Platelets: 413 10*3/uL — ABNORMAL HIGH (ref 150–400)
RBC: 3.46 MIL/uL — ABNORMAL LOW (ref 4.22–5.81)
RDW: 14.9 % (ref 11.5–15.5)
WBC: 7.4 10*3/uL (ref 4.0–10.5)
nRBC: 0 % (ref 0.0–0.2)

## 2021-05-24 LAB — PHOSPHORUS: Phosphorus: 4.3 mg/dL (ref 2.5–4.6)

## 2021-05-24 LAB — TSH: TSH: 4.146 u[IU]/mL (ref 0.350–4.500)

## 2021-05-24 LAB — MAGNESIUM: Magnesium: 1.7 mg/dL (ref 1.7–2.4)

## 2021-05-24 MED ORDER — PANTOPRAZOLE SODIUM 40 MG PO TBEC: 40.0000 mg | DELAYED_RELEASE_TABLET | Freq: Every day | ORAL | Status: DC

## 2021-05-24 MED ORDER — ACETAMINOPHEN 325 MG PO TABS
650.0000 mg | ORAL_TABLET | Freq: Four times a day (QID) | ORAL | Status: DC | PRN
  Administered 2021-06-03 – 2021-06-10 (×5): 650 mg via ORAL
  Filled 2021-05-24 (×4): qty 2

## 2021-05-24 MED ORDER — DEXAMETHASONE 0.5 MG/5ML PO SOLN: 2.0000 mg | Freq: Two times a day (BID) | ORAL | Status: DC

## 2021-05-24 MED ORDER — IPRATROPIUM-ALBUTEROL 0.5-2.5 (3) MG/3ML IN SOLN
3.0000 mL | Freq: Three times a day (TID) | RESPIRATORY_TRACT | Status: DC
  Administered 2021-05-24 – 2021-05-28 (×10): 3 mL via RESPIRATORY_TRACT
  Filled 2021-05-24 (×10): qty 3

## 2021-05-24 MED ORDER — DEXAMETHASONE 0.5 MG PO TABS
1.0000 mg | ORAL_TABLET | Freq: Every day | ORAL | Status: DC
  Administered 2021-06-08 – 2021-06-13 (×6): 1 mg via ORAL
  Filled 2021-05-24 (×7): qty 2

## 2021-05-24 MED ORDER — NICOTINE 14 MG/24HR TD PT24
14.0000 mg | MEDICATED_PATCH | Freq: Every day | TRANSDERMAL | Status: DC | PRN
  Administered 2021-05-24 – 2021-05-28 (×2): 14 mg via TRANSDERMAL
  Filled 2021-05-24 (×2): qty 1

## 2021-05-24 MED ORDER — LACTATED RINGERS IV BOLUS
1000.0000 mL | Freq: Once | INTRAVENOUS | Status: AC
  Administered 2021-05-24: 1000 mL via INTRAVENOUS

## 2021-05-24 MED ORDER — DEXAMETHASONE 4 MG PO TABS
2.0000 mg | ORAL_TABLET | Freq: Every day | ORAL | Status: AC
  Administered 2021-06-01 – 2021-06-07 (×7): 2 mg via ORAL
  Filled 2021-05-24 (×7): qty 1

## 2021-05-24 MED ORDER — DEXAMETHASONE 4 MG PO TABS
2.0000 mg | ORAL_TABLET | Freq: Two times a day (BID) | ORAL | Status: AC
  Administered 2021-05-26 – 2021-05-31 (×12): 2 mg via ORAL
  Filled 2021-05-24 (×12): qty 1

## 2021-05-24 MED ORDER — ACETAMINOPHEN 650 MG RE SUPP: 650.0000 mg | Freq: Four times a day (QID) | RECTAL | Status: DC | PRN

## 2021-05-24 MED ORDER — SODIUM CHLORIDE 0.9 % IV SOLN: INTRAVENOUS | Status: DC

## 2021-05-24 MED ORDER — ENSURE COMPLETE PO LIQD: 237.0000 mL | Freq: Three times a day (TID) | ORAL | Status: DC
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
## 2021-05-24 NOTE — Progress Notes (Signed)
[place] Clinical Social Work   [facility] representative contacted [employer] to ensure that referral for [employer] was received. It was and they have contacted patient/ family and scheduled an assessment for June 6.     [place] [place] Worker [employer]

## 2021-05-24 NOTE — ED Triage Notes (Signed)
Patient was at [facility] ENT for routine visit. Patient apparently needs emergent trach placed. Patient has no complaints at this time.

## 2021-05-25 ENCOUNTER — Inpatient Hospital Stay (HOSPITAL_COMMUNITY): Payer: Medicaid Other | Admitting: Certified Registered Nurse Anesthetist

## 2021-05-25 ENCOUNTER — Encounter (HOSPITAL_COMMUNITY): Payer: Self-pay | Admitting: Internal Medicine

## 2021-05-25 ENCOUNTER — Inpatient Hospital Stay (HOSPITAL_COMMUNITY): Payer: Medicaid Other

## 2021-05-25 ENCOUNTER — Encounter (HOSPITAL_COMMUNITY)
Admission: EM | Disposition: A | Discharge status: Home Health | Payer: Self-pay | Source: Home / Self Care | Attending: Internal Medicine

## 2021-05-25 DIAGNOSIS — R7989 Other specified abnormal findings of blood chemistry: Secondary | ICD-10-CM | POA: Diagnosis present

## 2021-05-25 DIAGNOSIS — E860 Dehydration: Secondary | ICD-10-CM | POA: Diagnosis present

## 2021-05-25 DIAGNOSIS — J9509 Other tracheostomy complication: Secondary | ICD-10-CM | POA: Diagnosis not present

## 2021-05-25 DIAGNOSIS — D649 Anemia, unspecified: Secondary | ICD-10-CM | POA: Diagnosis present

## 2021-05-25 DIAGNOSIS — J449 Chronic obstructive pulmonary disease, unspecified: Secondary | ICD-10-CM | POA: Diagnosis present

## 2021-05-25 HISTORY — PX: DIRECT LARYNGOSCOPY: SHX5326

## 2021-05-25 HISTORY — PX: TRACHEOSTOMY TUBE PLACEMENT: SHX814

## 2021-05-25 LAB — CBC
HCT: 29.3 % — ABNORMAL LOW (ref 39.0–52.0)
Hemoglobin: 8.9 g/dL — ABNORMAL LOW (ref 13.0–17.0)
MCH: 28.3 pg (ref 26.0–34.0)
MCHC: 30.4 g/dL (ref 30.0–36.0)
MCV: 93.3 fL (ref 80.0–100.0)
Platelets: 442 10*3/uL — ABNORMAL HIGH (ref 150–400)
RBC: 3.14 MIL/uL — ABNORMAL LOW (ref 4.22–5.81)
RDW: 14.9 % (ref 11.5–15.5)
WBC: 6.3 10*3/uL (ref 4.0–10.5)
nRBC: 0 % (ref 0.0–0.2)

## 2021-05-25 LAB — COMPREHENSIVE METABOLIC PANEL
ALT: 22 U/L (ref 0–44)
AST: 32 U/L (ref 15–41)
Albumin: 2.4 g/dL — ABNORMAL LOW (ref 3.5–5.0)
Alkaline Phosphatase: 56 U/L (ref 38–126)
Anion gap: 7 (ref 5–15)
BUN: 12 mg/dL (ref 6–20)
CO2: 32 mmol/L (ref 22–32)
Calcium: 8.7 mg/dL — ABNORMAL LOW (ref 8.9–10.3)
Chloride: 96 mmol/L — ABNORMAL LOW (ref 98–111)
Creatinine, Ser: 0.5 mg/dL — ABNORMAL LOW (ref 0.61–1.24)
GFR, Estimated: >60 mL/min (ref >60)
Glucose, Bld: 97 mg/dL (ref 70–99)
Potassium: 4.2 mmol/L (ref 3.5–5.1)
Sodium: 135 mmol/L (ref 135–145)
Total Bilirubin: 0.9 mg/dL (ref 0.3–1.2)
Total Protein: 6.8 g/dL (ref 6.5–8.1)

## 2021-05-25 LAB — RETICULOCYTES
Immature Retic Fract: 20 % — ABNORMAL HIGH (ref 2.3–15.9)
RBC.: 3.17 MIL/uL — ABNORMAL LOW (ref 4.22–5.81)
Retic Count, Absolute: 94.5 10*3/uL (ref 19.0–186.0)
Retic Ct Pct: 3 % (ref 0.4–3.1)

## 2021-05-25 LAB — PROTIME-INR
INR: 1.1 (ref 0.8–1.2)
Prothrombin Time: 14 seconds (ref 11.4–15.2)

## 2021-05-25 LAB — FERRITIN: Ferritin: 455 ng/mL — ABNORMAL HIGH (ref 24–336)

## 2021-05-25 LAB — IRON AND TIBC
Iron: 31 ug/dL — ABNORMAL LOW (ref 45–182)
Saturation Ratios: 15 % — ABNORMAL LOW (ref 17.9–39.5)
TIBC: 209 ug/dL — ABNORMAL LOW (ref 250–450)
UIBC: 178 ug/dL

## 2021-05-25 LAB — FOLATE: Folate: 20.6 ng/mL (ref >5.9)

## 2021-05-25 LAB — PREALBUMIN: Prealbumin: 9 mg/dL — ABNORMAL LOW (ref 18–38)

## 2021-05-25 LAB — MAGNESIUM: Magnesium: 1.7 mg/dL (ref 1.7–2.4)

## 2021-05-25 LAB — PHOSPHORUS: Phosphorus: 3.8 mg/dL (ref 2.5–4.6)

## 2021-05-25 LAB — HIV ANTIBODY (ROUTINE TESTING W REFLEX): HIV Screen 4th Generation wRfx: NONREACTIVE

## 2021-05-25 SURGERY — CREATION, TRACHEOSTOMY
Anesthesia: General | Site: Throat

## 2021-05-25 MED ORDER — FENTANYL CITRATE (PF) 250 MCG/5ML IJ SOLN
INTRAMUSCULAR | Status: DC | PRN
  Administered 2021-05-25: 50 ug via INTRAVENOUS

## 2021-05-25 MED ORDER — DEXMEDETOMIDINE HCL IN NACL 400 MCG/100ML IV SOLN
INTRAVENOUS | Status: DC | PRN
  Administered 2021-05-25: .4 ug/kg/h via INTRAVENOUS

## 2021-05-25 MED ORDER — ONDANSETRON HCL 4 MG/2ML IJ SOLN: 4.0000 mg | Freq: Once | INTRAMUSCULAR | Status: DC | PRN

## 2021-05-25 MED ORDER — STERILE WATER FOR IRRIGATION IR SOLN
Status: DC | PRN
  Administered 2021-05-25: 1000 mL

## 2021-05-25 MED ORDER — ONDANSETRON HCL 4 MG/2ML IJ SOLN
INTRAMUSCULAR | Status: DC | PRN
  Administered 2021-05-25: 4 mg via INTRAVENOUS

## 2021-05-25 MED ORDER — ORAL CARE MOUTH RINSE: 15.0000 mL | Freq: Once | OROMUCOSAL | Status: AC

## 2021-05-25 MED ORDER — OXYMETAZOLINE HCL 0.05 % NA SOLN
NASAL | Status: AC
  Filled 2021-05-25: qty 30

## 2021-05-25 MED ORDER — DEXMEDETOMIDINE HCL IN NACL 400 MCG/100ML IV SOLN
INTRAVENOUS | Status: AC
  Filled 2021-05-25: qty 100

## 2021-05-25 MED ORDER — OXYMETAZOLINE HCL 0.05 % NA SOLN
NASAL | Status: DC | PRN
  Administered 2021-05-25: 1 via TOPICAL

## 2021-05-25 MED ORDER — PROPOFOL 10 MG/ML IV BOLUS
INTRAVENOUS | Status: AC
  Filled 2021-05-25: qty 20

## 2021-05-25 MED ORDER — LIDOCAINE-EPINEPHRINE 1 %-1:100000 IJ SOLN
INTRAMUSCULAR | Status: AC
  Filled 2021-05-25: qty 1

## 2021-05-25 MED ORDER — CHLORHEXIDINE GLUCONATE 0.12 % MT SOLN: 15.0000 mL | Freq: Once | OROMUCOSAL | Status: AC

## 2021-05-25 MED ORDER — 0.9 % SODIUM CHLORIDE (POUR BTL) OPTIME
TOPICAL | Status: DC | PRN
  Administered 2021-05-25: 1000 mL

## 2021-05-25 MED ORDER — SUGAMMADEX SODIUM 200 MG/2ML IV SOLN
INTRAVENOUS | Status: DC | PRN
  Administered 2021-05-25: 100 mg via INTRAVENOUS

## 2021-05-25 MED ORDER — MIDAZOLAM HCL 2 MG/2ML IJ SOLN
INTRAMUSCULAR | Status: AC
  Filled 2021-05-25: qty 2

## 2021-05-25 MED ORDER — LACTATED RINGERS IV SOLN: INTRAVENOUS | Status: DC

## 2021-05-25 MED ORDER — MIDAZOLAM HCL 2 MG/2ML IJ SOLN
INTRAMUSCULAR | Status: DC | PRN
  Administered 2021-05-25 (×2): 1 mg via INTRAVENOUS

## 2021-05-25 MED ORDER — LIDOCAINE-EPINEPHRINE 1 %-1:100000 IJ SOLN
INTRAMUSCULAR | Status: DC | PRN
  Administered 2021-05-25: 20 mL

## 2021-05-25 MED ORDER — ALBUMIN HUMAN 5 % IV SOLN: INTRAVENOUS | Status: DC | PRN

## 2021-05-25 MED ORDER — DEXMEDETOMIDINE (PRECEDEX) IN NS 20 MCG/5ML (4 MCG/ML) IV SYRINGE
PREFILLED_SYRINGE | INTRAVENOUS | Status: DC | PRN
  Administered 2021-05-25 (×2): 8 ug via INTRAVENOUS

## 2021-05-25 MED ORDER — CHLORHEXIDINE GLUCONATE 0.12 % MT SOLN
OROMUCOSAL | Status: AC
  Administered 2021-05-25: 15 mL via OROMUCOSAL
  Filled 2021-05-25: qty 15

## 2021-05-25 MED ORDER — FENTANYL CITRATE (PF) 100 MCG/2ML IJ SOLN: 25.0000 ug | INTRAMUSCULAR | Status: DC | PRN

## 2021-05-25 MED ORDER — FENTANYL CITRATE (PF) 250 MCG/5ML IJ SOLN
INTRAMUSCULAR | Status: AC
  Filled 2021-05-25: qty 5

## 2021-05-25 MED ORDER — DEXAMETHASONE SODIUM PHOSPHATE 10 MG/ML IJ SOLN
INTRAMUSCULAR | Status: DC | PRN
  Administered 2021-05-25: 10 mg via INTRAVENOUS

## 2021-05-25 MED ORDER — ROCURONIUM BROMIDE 10 MG/ML (PF) SYRINGE
PREFILLED_SYRINGE | INTRAVENOUS | Status: DC | PRN
  Administered 2021-05-25: 20 mg via INTRAVENOUS

## 2021-05-25 MED ORDER — PHENYLEPHRINE HCL (PRESSORS) 10 MG/ML IV SOLN
INTRAVENOUS | Status: DC | PRN
  Administered 2021-05-25 (×5): 80 ug via INTRAVENOUS

## 2021-05-25 MED ORDER — MAGNESIUM SULFATE IN D5W 1-5 GM/100ML-% IV SOLN
1.0000 g | Freq: Once | INTRAVENOUS | Status: AC
  Administered 2021-05-25: 1 g via INTRAVENOUS
  Filled 2021-05-25 (×2): qty 100

## 2021-05-25 SURGICAL SUPPLY — 47 items
BLADE CLIPPER SURG (BLADE) IMPLANT
BLADE SURG 15 STRL LF DISP TIS (BLADE) IMPLANT
BLADE SURG 15 STRL SS (BLADE) ×2
CANISTER SUCT 3000ML PPV (MISCELLANEOUS) ×4 IMPLANT
CLEANER TIP ELECTROSURG 2X2 (MISCELLANEOUS) ×4 IMPLANT
CONT SPEC 4OZ CLIKSEAL STRL BL (MISCELLANEOUS) ×2 IMPLANT
CORD BIPOLAR FORCEPS 12FT (ELECTRODE) IMPLANT
COVER SURGICAL LIGHT HANDLE (MISCELLANEOUS) ×4 IMPLANT
COVER WAND RF STERILE (DRAPES) ×4 IMPLANT
DECANTER SPIKE VIAL GLASS SM (MISCELLANEOUS) ×4 IMPLANT
DEFOGGER ANTIFOG KIT (MISCELLANEOUS) ×2 IMPLANT
DRAPE HALF SHEET 40X57 (DRAPES) ×2 IMPLANT
DRSG TELFA 3X8 NADH (GAUZE/BANDAGES/DRESSINGS) ×4 IMPLANT
ELECT COATED BLADE 2.86 ST (ELECTRODE) ×4 IMPLANT
ELECT REM PT RETURN 9FT ADLT (ELECTROSURGICAL) ×4
ELECTRODE REM PT RTRN 9FT ADLT (ELECTROSURGICAL) ×2 IMPLANT
FORCEPS BIPOLAR SPETZLER 8 1.0 (NEUROSURGERY SUPPLIES) IMPLANT
GAUZE 4X4 16PLY RFD (DISPOSABLE) ×4 IMPLANT
GAUZE PETROLATUM 1 X8 (GAUZE/BANDAGES/DRESSINGS) IMPLANT
GLOVE BIO SURGEON STRL SZ7.5 (GLOVE) ×4 IMPLANT
GOWN STRL REUS W/ TWL LRG LVL3 (GOWN DISPOSABLE) ×4 IMPLANT
GOWN STRL REUS W/TWL LRG LVL3 (GOWN DISPOSABLE) ×4
HOLDER TRACH TUBE VELCRO 19.5 (MISCELLANEOUS) ×4 IMPLANT
KIT BASIN OR (CUSTOM PROCEDURE TRAY) ×4 IMPLANT
KIT SUCTION CATH 14FR (SUCTIONS) ×2 IMPLANT
KIT TURNOVER KIT B (KITS) ×4 IMPLANT
NDL HYPO 25GX1X1/2 BEV (NEEDLE) IMPLANT
NEEDLE HYPO 25GX1X1/2 BEV (NEEDLE) ×4 IMPLANT
NS IRRIG 1000ML POUR BTL (IV SOLUTION) ×4 IMPLANT
PAD ARMBOARD 7.5X6 YLW CONV (MISCELLANEOUS) ×8 IMPLANT
PAD DRESSING TELFA 3X8 NADH (GAUZE/BANDAGES/DRESSINGS) IMPLANT
PATTIES SURGICAL .5 X3 (DISPOSABLE) ×2 IMPLANT
POSITIONER HEAD DONUT 9IN (MISCELLANEOUS) ×4 IMPLANT
SPONGE DRAIN TRACH 4X4 STRL 2S (GAUZE/BANDAGES/DRESSINGS) ×4 IMPLANT
SPONGE INTESTINAL PEANUT (DISPOSABLE) IMPLANT
SUT SILK 0 FSL (SUTURE) ×4 IMPLANT
SUT SILK 2 0 REEL (SUTURE) ×4 IMPLANT
SUT SILK 2 0 SH CR/8 (SUTURE) ×4 IMPLANT
SUT VIC AB 2-0 FS1 27 (SUTURE) IMPLANT
SYR 20ML ECCENTRIC (SYRINGE) ×4 IMPLANT
SYR BULB EAR ULCER 3OZ GRN STR (SYRINGE) ×4 IMPLANT
SYR CONTROL 10ML LL (SYRINGE) ×4 IMPLANT
TRAY ENT MC OR (CUSTOM PROCEDURE TRAY) ×4 IMPLANT
TUBE TRACH  6.0 CUFF FLEX (MISCELLANEOUS) ×2
TUBE TRACH 6.0 CUFF FLEX (MISCELLANEOUS) IMPLANT
TUBE TRACH FLEX 4.0 CUFF (MISCELLANEOUS) ×2 IMPLANT
WATER STERILE IRR 1000ML POUR (IV SOLUTION) ×4 IMPLANT

## 2021-05-25 NOTE — Transfer of Care (Signed)
Immediate Anesthesia Transfer of Care Note  Patient: Perry Waller  Procedure(s) Performed: AWAKE TRACHEOSTOMY REVISION (N/A Neck) DIRECT LARYNGOSCOPY WITH BIOPSY (N/A Throat)  Patient Location: PACU  Anesthesia Type:General  Level of Consciousness: drowsy  Airway & Oxygen Therapy: Patient Spontanous Breathing  Post-op Assessment: Report given to RN and Post -op Vital signs reviewed and stable  Post vital signs: Reviewed  Last Vitals:  Vitals Value Taken Time  BP 99/72 05/25/21 1107  Temp    Pulse 94 05/25/21 1110  Resp 26 05/25/21 1110  SpO2 96 % 05/25/21 1110  Vitals shown include unvalidated device data.  Last Pain:  Vitals:   05/25/21 0850  TempSrc: Oral  PainSc: 0-No pain         Complications: No complications documented.

## 2021-05-25 NOTE — Anesthesia Preprocedure Evaluation (Addendum)
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   Reviewed: Allergy & Precautions, Patient's Chart, lab work & pertinent test results  Airway Mallampati: Trach  TM Distance: >3 FB Neck ROM: Full    Dental  (+) Dental Advisory Given, Poor Dentition, Missing   Pulmonary COPD, Current Smoker and Patient abstained from smoking.,    Pulmonary exam normal breath sounds clear to auscultation       Cardiovascular negative cardio ROS Normal cardiovascular exam Rhythm:Regular Rate:Normal     Neuro/Psych negative neurological ROS     GI/Hepatic negative GI ROS, Neg liver ROS,   Endo/Other  negative endocrine ROS  Renal/GU negative Renal ROS     Musculoskeletal  (+) Arthritis ,   Abdominal   Peds  Hematology  (+) Blood dyscrasia, anemia ,   Anesthesia Other Findings   Reproductive/Obstetrics                            Anesthesia Physical Anesthesia Plan  ASA: IV  Anesthesia Plan: General   Post-op Pain Management:    Induction: Intravenous  PONV Risk Score and Plan: Midazolam, Propofol infusion and Treatment may vary due to age or medical condition  Airway Management Planned: Tracheostomy  Additional Equipment:   Intra-op Plan:   Post-operative Plan: Post-operative intubation/ventilation  Informed Consent: I have reviewed the patients History and Physical, chart, labs and discussed the procedure including the risks, benefits and alternatives for the proposed anesthesia with the patient or authorized representative who has indicated his/her  understanding and acceptance.   Patient has DNR.  Discussed DNR with power of attorney and Suspend DNR.   Dental advisory given  Plan Discussed with: CRNA  Anesthesia Plan Comments:       Anesthesia Quick Evaluation
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
## 2021-05-25 NOTE — Interval H&P Note (Signed)
History and Physical Interval Note:  05/25/2021 9:07 AM  Perry Waller  has presented today for surgery, with the diagnosis of RESPIRATORY FAILURE.  The various methods of treatment have been discussed with the patient and family. After consideration of risks, benefits and other options for treatment, the patient has consented to  Procedure(s): AWAKE TRACHEOSTOMY REVISION (N/A) DIRECT LARYNGOSCOPY WITH BIOPSY (N/A) as a surgical intervention.  The patient's history has been reviewed, patient examined, no change in status, stable for surgery.  I have reviewed the patient's chart and labs.  Questions were answered to the patient's satisfaction.     Jaspreet Hollings A Danila Eddie

## 2021-05-25 NOTE — ED Notes (Signed)
Patient requesting medication for sleep at this time. Lights dimmed for comfort. Patient appears in no acute distress at this time.

## 2021-05-25 NOTE — Progress Notes (Signed)
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  Patient was admitted from the ENTs office with concerns for tracheostomy obstruction and plans for tracheostomy revision procedure.  Patient has undergone revision of the tracheostomy.  Patient is currently in PACU.  Patient's sister has been updated.  No significant history from patient.   Assessment & Plan:   Principal Problem:   Tracheostomy obstruction (HCC) Active Problems:   Tobacco abuse   Protein-calorie malnutrition, severe   Tracheal stenosis following tracheostomy (HCC)   Dehydration   Prerenal azotemia   COPD (chronic obstructive pulmonary disease) (HCC)   Chronic anemia   Tracheostomy obstruction: -See above documentation. -ENT team is directing management.  Patient has undergone revision. -Postop management as per ENT.  Severe protein calorie malnutrition: -Likely multifactorial. -With dietary input. -Low albumin may have prognostic significance.  COPD: -Stable. -Continue current management.  History of chronic tobacco abuse: -We will counsel patient when feasible.  Chronic anemia: -Stable.  Hypomagnesemia: -IV magnesium 1 g x 1 dose. -Last magnesium was 1.7.   DVT prophylaxis: SCD. Code Status: DNR/DNI Family Communication: Sister Disposition Plan: This will depend on hospital course.  ENT is directing postop management.   Consultants:   ENT  Procedures:   Revision of tracheostomy.  Antimicrobials:   None   Subjective: Patient is unable to provide any history.  Objective: Vitals:   05/25/21 1107 05/25/21 1122 05/25/21 1137 05/25/21 1152  BP: 99/72  101/74 108/80 104/76  Pulse: 90 89 86 84  Resp: (!) 25 (!) 27 (!) 21 (!) 21  Temp: (!) 97 F (36.1 C)     TempSrc:      SpO2: 100% 92% 93% 93%  Weight:      Height:        Intake/Output Summary (Last 24 hours) at 05/25/2021 1153 Last data filed at 05/25/2021 1103 Gross per 24 hour  Intake 1500 ml  Output 5 ml  Net 1495 ml   Filed Weights   05/24/21 1700  Weight: 40.8 kg    Examination:  General exam: Patient is cachectic.  Patient is still recovering from anesthesia.  No significant history from patient.    Respiratory system: Clear to auscultation. Cardiovascular system: S1 & S2  Gastrointestinal system: Abdomen is nondistended, soft and nontender. No organomegaly or masses felt. Normal bowel sounds heard. Central nervous system: No history from patient.  Patient is still recovering from anesthesia.   Extremities: No leg edema.  Data Reviewed: I have personally reviewed following labs and imaging studies  CBC: Recent Labs  Lab 05/24/21 1735 05/25/21 0340  WBC 7.4 6.3  NEUTROABS 5.5  --   HGB 9.8* 8.9*  HCT 32.2* 29.3*  MCV 93.1 93.3  PLT 413* 366*   Basic Metabolic Panel: Recent Labs  Lab 05/24/21 1735 05/25/21 0340  NA 136 135  K 4.5 4.2  CL 92* 96*  CO2 32 32  GLUCOSE 89 97  BUN 19 12  CREATININE 0.51* 0.50*  CALCIUM 9.1 8.7*  MG 1.7 1.7  PHOS 4.3 3.8   GFR: Estimated Creatinine Clearance: 57.4 mL/min (A) (by C-G formula based on SCr of 0.5 mg/dL (L)). Liver Function Tests: Recent Labs  Lab 05/24/21 1735 05/25/21  0340  AST 34 32  ALT 23 22  ALKPHOS 57 56  BILITOT 1.1 0.9  PROT 7.1 6.8  ALBUMIN 2.7* 2.4*   No results for input(s): LIPASE, AMYLASE in the last 168 hours. No results for input(s): AMMONIA in the last 168 hours. Coagulation Profile: Recent Labs  Lab 05/25/21 0340  INR 1.1   Cardiac Enzymes: No results for input(s): CKTOTAL, CKMB, CKMBINDEX, TROPONINI in the last 168 hours. BNP (last 3 results) No results for input(s):  PROBNP in the last 8760 hours. HbA1C: No results for input(s): HGBA1C in the last 72 hours. CBG: No results for input(s): GLUCAP in the last 168 hours. Lipid Profile: No results for input(s): CHOL, HDL, LDLCALC, TRIG, CHOLHDL, LDLDIRECT in the last 72 hours. Thyroid Function Tests: Recent Labs    05/24/21 1736  TSH 4.146   Anemia Panel: Recent Labs    05/25/21 0340  FOLATE 20.6  FERRITIN 455*  TIBC 209*  IRON 31*  RETICCTPCT 3.0   Urine analysis:    Component Value Date/Time   COLORURINE YELLOW 10/17/2011 2052   APPEARANCEUR CLEAR 10/17/2011 2052   LABSPEC 1.024 10/17/2011 2052   PHURINE 6.5 10/17/2011 2052   GLUCOSEU NEGATIVE 10/17/2011 2052   HGBUR TRACE (A) 10/17/2011 2052   BILIRUBINUR NEGATIVE 10/17/2011 2052   KETONESUR NEGATIVE 10/17/2011 2052   PROTEINUR NEGATIVE 10/17/2011 2052   UROBILINOGEN 0.2 10/17/2011 2052   NITRITE NEGATIVE 10/17/2011 2052   LEUKOCYTESUR NEGATIVE 10/17/2011 2052   Sepsis Labs: @LABRCNTIP (procalcitonin:4,lacticidven:4)  ) Recent Results (from the past 240 hour(s))  Resp Panel by RT-PCR (Flu A&B, Covid) Nasopharyngeal Swab     Status: None   Collection Time: 05/24/21  5:35 PM   Specimen: Nasopharyngeal Swab; Nasopharyngeal(NP) swabs in vial transport medium  Result Value Ref Range Status   SARS Coronavirus 2 by RT PCR NEGATIVE NEGATIVE Final    Comment: (NOTE) SARS-CoV-2 target nucleic acids are NOT DETECTED.  The SARS-CoV-2 RNA is generally detectable in upper respiratory specimens during the acute phase of infection. The lowest concentration of SARS-CoV-2 viral copies this assay can detect is 138 copies/mL. A negative result does not preclude SARS-Cov-2 infection and should not be used as the sole basis for treatment or other patient management decisions. A negative result may occur with  improper specimen collection/handling, submission of specimen other than nasopharyngeal swab, presence of viral mutation(s) within  the areas targeted by this assay, and inadequate number of viral copies(<138 copies/mL). A negative result must be combined with clinical observations, patient history, and epidemiological information. The expected result is Negative.  Fact Sheet for Patients:  EntrepreneurPulse.com.au  Fact Sheet for Healthcare Providers:  IncredibleEmployment.be  This test is no t yet approved or cleared by the [place] FDA and  has been authorized for detection and/or diagnosis of SARS-CoV-2 by FDA under an Emergency Use Authorization (EUA). This EUA will remain  in effect (meaning this test can be used) for the duration of the COVID-19 declaration under Section 564(b)(1) of the Act, 21 U.S.C.section 360bbb-3(b)(1), unless the authorization is terminated  or revoked sooner.       Influenza A by PCR NEGATIVE NEGATIVE Final   Influenza B by PCR NEGATIVE NEGATIVE Final    Comment: (NOTE) The Xpert Xpress SARS-CoV-2/FLU/RSV plus assay is intended as an aid in the diagnosis of influenza from Nasopharyngeal swab specimens and should not be used as a sole basis for treatment. Nasal washings and aspirates are unacceptable for Xpert Xpress SARS-CoV-2/FLU/RSV testing.  Fact Sheet for  Patients: EntrepreneurPulse.com.au  Fact Sheet for Healthcare Providers: IncredibleEmployment.be  This test is not yet approved or cleared by the [place] FDA and has been authorized for detection and/or diagnosis of SARS-CoV-2 by FDA under an Emergency Use Authorization (EUA). This EUA will remain in effect (meaning this test can be used) for the duration of the COVID-19 declaration under Section 564(b)(1) of the Act, 21 U.S.C. section 360bbb-3(b)(1), unless the authorization is terminated or revoked.  Performed at [facility], [place] [address], [place], [place] [address]          Radiology Studies: DG Chest Portable 1  View  Result Date: 05/24/2021 CLINICAL DATA:  Preop. EXAM: PORTABLE CHEST 1 VIEW COMPARISON:  Most recent chest imaging radiograph 08/28/2019. CT 08/17/2020 FINDINGS: Biapical pleuroparenchymal thickening, with thick-walled apical bullous changes and nodularity better appreciated on prior CT. Slightly lower lung volumes from prior exam. There are ill-defined reticulonodular opacities in the right greater than left infrahilar lungs. Heart is normal in size. Slight hilar prominence. No pneumothorax or significant pleural effusion. No acute osseous abnormalities are seen IMPRESSION: 1. Ill-defined reticulonodular opacities in the right greater than left infrahilar lungs, suspicious for infection, aspiration also considered. 2. Biapical pleuroparenchymal thickening, with thick-walled apical bullous changes and nodularity better appreciated on prior CT. Electronically Signed   By: Keith Rake M.D.   On: 05/24/2021 17:58        Scheduled Meds: . [MAR Hold] dexamethasone  2 mg Oral BID WC   Followed by  . [MAR Hold] dexamethasone  2 mg Oral Q breakfast   Followed by  . [MAR Hold] dexamethasone  1 mg Oral Q breakfast  . [MAR Hold] ipratropium-albuterol  3 mL Nebulization TID  . [MAR Hold] pantoprazole  40 mg Oral Daily   Continuous Infusions: . sodium chloride 100 mL/hr at 05/24/21 2110  . lactated ringers 10 mL/hr at 05/25/21 0858     LOS: 1 day    Time spent: 35 minutes.    Dana Allan, MD  [facility] Pager #: [phone] 7PM-7AM contact night coverage as above

## 2021-05-25 NOTE — Progress Notes (Signed)
[facility] NOTE  Rockie Schnoor is a 60 y/o M with history of laryngeal cancer, status post chemo and XRT, status post trach in 2020 with subsequent decannulation presenting with stridor, now POD #0 s/p tracheostomy and direct laryngoscopy and biopsy.  Plan: -Patient awaiting bed placement  -CXR in PACU -Begin routine trach care  -Do NOT cut trach sutures or ties, first trach change will be performed by ENT on POD #5 to cuffless trach if patient stable on trach collar -Please keep obturator taped to [place] NPO status while cuff inflated and until SLP assessment. Patient would likely benefit from replacement of feeding tube considering poor nutrition and high likelihood of aspiration. Needs evaluation by nutrition and SLP.  -Will follow up results of operative biopsy -Recommend social work consultation, patient will likely require placement after discharge for care management   Thank you for allowing me to participate in the care of this patient. Please do not hesitate to contact me with any questions or concerns.   Jason Coop, [facility] Cell: [phone]
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
## 2021-05-25 NOTE — Progress Notes (Signed)
Pt's admitting bed request by Dr. Velia Meyer is for med-surg telemetry. Pt is DNR and no cardiac history, discussed with Skotnicki. She gave the OK for the bed request to be changed but she wasn't able to do so because she is not the admitting MD. Tamala Julian and Marthenia Rolling (MDs) were notified and this RN was told that the admitting MD must do so. Skotnicki said if admitting MD says OK and she's OK with bed change, then RN can change bed request to med-surg.

## 2021-05-25 NOTE — Anesthesia Postprocedure Evaluation (Signed)
Anesthesia Post Note  Patient: Perry Waller  Procedure(s) Performed: AWAKE TRACHEOSTOMY REVISION (N/A Neck) DIRECT LARYNGOSCOPY WITH BIOPSY (N/A Throat)     Patient location during evaluation: PACU Anesthesia Type: General Level of consciousness: awake and alert Pain management: pain level controlled Vital Signs Assessment: post-procedure vital signs reviewed and stable Respiratory status: spontaneous breathing, respiratory function stable and patient connected to tracheostomy mask oxygen Cardiovascular status: blood pressure returned to baseline and stable Postop Assessment: no apparent nausea or vomiting Anesthetic complications: no   No complications documented.  Last Vitals:  Vitals:   05/25/21 1409 05/25/21 1420  BP:    Pulse:    Resp:    Temp:    SpO2: 95% 98%    Last Pain:  Vitals:   05/25/21 1358  TempSrc: Oral  PainSc:                  Catalina Gravel

## 2021-05-25 NOTE — Op Note (Addendum)
OPERATIVE NOTE  Perry Waller Date/Time of Admission: 05/24/2021  4:56 PM  CSN: [id number];YHC:[id number] Attending Provider: Bonnell Public, MD Room/Bed: MCPO/NONE DOB: 1961-05-01 Age: 60 y.o.   Pre-Op Diagnosis: RESPIRATORY FAILURE  Post-Op Diagnosis: Respiratory failure  Procedure: Procedure(s): AWAKE TRACHEOSTOMY REVISION DIRECT LARYNGOSCOPY WITH BIOPSY  Anesthesia: Choice  Surgeon(s): Felicia Both A Roshawnda Pecora, DO  Staff: Circulator: Satterfield, Shirl A, RN Scrub Person: Arrington, Mittie Bodo, RN; Izola Price V, CST  Implants: * No implants in log *  Specimens: ID Type Source Tests Collected by Time Destination  1 :  Tissue Other Source SURGICAL PATHOLOGY Irish Breisch A, DO 05/15/6159 [id number]     Complications: Patient acutely desaturated during induction and urgent tracheostomy was performed  EBL: Minimal  Condition: stable  Operative Findings:  Extensive fibrinous debris and thick drainage coating and obstructing the supraglottis and glottis, which was easily suctioned. Mild edema and post-radiation changes of the true and false vocal folds and arytenoids. No mass lesion appreciated.   Description of Operation: Once operative consent was obtained and the site and surgery were confirmed with the patient and the operating room team, the patient was brought back to the operating room. The patient was positioned and prepped, and a 2cm horizontal incision was planned including an elliptical excision of the existing tracheocutaneous fistula.  The planned incision site was injected with 8 cc of 1% lidocaine with 1-100,000 epinephrine.  The patient was then prepped and draped in clean fashion.  The patient was lightly sedated, and began to quickly desaturate.  Decision was made to perform emergent tracheotomy, with incision made as planned with 15 blade and widening of the existing tracheocutaneous fistula with a 15 blade and hemostat.  A Shiley 6 tracheostomy tube was  placed, and the patient's saturations were noted to normalize, however there was no end-tidal return.  Because of this, a fiberoptic bronchoscope was brought into the field, and used to visualize tracheostomy tube placement.  The tracheostomy tube was noted to be caught against the posterior tracheal Tomaszewski, and not properly seated into the tracheal lumen.  The Shiley 6 tracheostomy tube was removed, and a Shiley 4-0 tracheostomy tube was placed, using the fiberoptic bronchoscope confirm appropriate placement.  Following placement, the bronchoscope was again used, and the carina was easily visualized through the tracheostomy lumen.  Once the patient was stabilized under general anesthesia, and bleeding around the tracheostomy site was meticulously controlled using bipolar cautery, the tracheostomy tube was again removed, the posterior tracheal Mcmath was examined directly and using the bronchoscope.  There was mucosal injury but no evidence of perforation.  The Shiley 4-0 tracheostomy tube was replaced and appropriate position was again confirmed using the bronch. It was then secured using 3-0 silk sutures and a Posey trach tie.  The site was inspected once again for bleeding, and none was noted.  At this time, the patient's bed was rotated 90 degrees to prepare for the direct laryngoscopy and biopsy.   An operating laryngoscope was used to directly visualize the upper airway and glottis. All anatomic areas from the oral cavity to the glottis were examined and noted to be normal with only exceptions noted in this report. Areas examined included the oropharynx, vallecula, both surfaces of the epiglottis, glottis, post cricoid region and bilateral pyriform sinuses.   The patient was placed in laryngeal suspension with the focus on the glottis with the ET tube intact.  A 0 degree endoscope was used to visualize the vocal cords and a laryngeal biopsy  forcep was used to obtain tissue from the left false vocal fold, which was  sent as a permanent specimen to pathology. Hemostasis was obtained with an afrin soaked pledget.   An oral gastric tube was placed into the stomach and suctioned to reduce postoperative nausea. The patient was turned back over to the anesthesia service. Patient was returned to the care of anesthesia and was transferred from the operating room to PACU in stable condition.    Jason Coop, [facility]  05/25/2021

## 2021-05-26 ENCOUNTER — Inpatient Hospital Stay (HOSPITAL_COMMUNITY): Payer: Medicaid Other

## 2021-05-26 DIAGNOSIS — J9509 Other tracheostomy complication: Secondary | ICD-10-CM | POA: Diagnosis not present

## 2021-05-26 DIAGNOSIS — Z515 Encounter for palliative care: Secondary | ICD-10-CM | POA: Diagnosis not present

## 2021-05-26 DIAGNOSIS — R531 Weakness: Secondary | ICD-10-CM

## 2021-05-26 DIAGNOSIS — E43 Unspecified severe protein-calorie malnutrition: Secondary | ICD-10-CM | POA: Diagnosis not present

## 2021-05-26 LAB — CBC WITH DIFFERENTIAL/PLATELET
Abs Immature Granulocytes: 0.04 10*3/uL (ref 0.00–0.07)
Basophils Absolute: 0 10*3/uL (ref 0.0–0.1)
Basophils Relative: 0 %
Eosinophils Absolute: 0 10*3/uL (ref 0.0–0.5)
Eosinophils Relative: 0 %
HCT: 27.3 % — ABNORMAL LOW (ref 39.0–52.0)
Hemoglobin: 8.4 g/dL — ABNORMAL LOW (ref 13.0–17.0)
Immature Granulocytes: 1 %
Lymphocytes Relative: 7 %
Lymphs Abs: 0.6 10*3/uL — ABNORMAL LOW (ref 0.7–4.0)
MCH: 28.2 pg (ref 26.0–34.0)
MCHC: 30.8 g/dL (ref 30.0–36.0)
MCV: 91.6 fL (ref 80.0–100.0)
Monocytes Absolute: 0.8 10*3/uL (ref 0.1–1.0)
Monocytes Relative: 9 %
Neutro Abs: 7.1 10*3/uL (ref 1.7–7.7)
Neutrophils Relative %: 83 %
Platelets: 375 10*3/uL (ref 150–400)
RBC: 2.98 MIL/uL — ABNORMAL LOW (ref 4.22–5.81)
RDW: 15 % (ref 11.5–15.5)
WBC: 8.5 10*3/uL (ref 4.0–10.5)
nRBC: 0 % (ref 0.0–0.2)

## 2021-05-26 LAB — RENAL FUNCTION PANEL
Albumin: 2.4 g/dL — ABNORMAL LOW (ref 3.5–5.0)
Anion gap: 10 (ref 5–15)
BUN: 8 mg/dL (ref 6–20)
CO2: 24 mmol/L (ref 22–32)
Calcium: 8.4 mg/dL — ABNORMAL LOW (ref 8.9–10.3)
Chloride: 102 mmol/L (ref 98–111)
Creatinine, Ser: 0.53 mg/dL — ABNORMAL LOW (ref 0.61–1.24)
GFR, Estimated: >60 mL/min (ref >60)
Glucose, Bld: 114 mg/dL — ABNORMAL HIGH (ref 70–99)
Phosphorus: 3.4 mg/dL (ref 2.5–4.6)
Potassium: 4.1 mmol/L (ref 3.5–5.1)
Sodium: 136 mmol/L (ref 135–145)

## 2021-05-26 LAB — MAGNESIUM: Magnesium: 1.6 mg/dL — ABNORMAL LOW (ref 1.7–2.4)

## 2021-05-26 LAB — CALCIUM, IONIZED: Calcium, Ionized, Serum: 4.9 mg/dL (ref 4.5–5.6)

## 2021-05-26 MED ORDER — DIATRIZOATE MEGLUMINE & SODIUM 66-10 % PO SOLN
20.0000 mL | Freq: Once | ORAL | Status: AC
  Administered 2021-05-26: 20 mL via ORAL
  Filled 2021-05-26: qty 30

## 2021-05-26 MED ORDER — MAGNESIUM SULFATE 2 GM/50ML IV SOLN
2.0000 g | Freq: Once | INTRAVENOUS | Status: AC
  Administered 2021-05-26: 2 g via INTRAVENOUS
  Filled 2021-05-26: qty 50

## 2021-05-26 MED ORDER — PANTOPRAZOLE SODIUM 40 MG PO PACK
40.0000 mg | PACK | Freq: Every day | ORAL | Status: DC
  Administered 2021-05-27: 40 mg
  Filled 2021-05-26 (×3): qty 20

## 2021-05-26 NOTE — Plan of Care (Signed)
  Problem: Clinical Measurements: Goal: Respiratory complications will improve Outcome: Progressing   
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
## 2021-05-26 NOTE — Progress Notes (Signed)
RT NOTES: Suctioned for moderate amount of thick yellow secretions.

## 2021-05-26 NOTE — Progress Notes (Signed)
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  Patient was admitted from the ENTs office with concerns for tracheostomy obstruction and plans for tracheostomy revision procedure.  Patient has undergone revision of the tracheostomy.  Patient is currently in PACU.  Patient's sister has been updated.  No significant history from patient.  05/26/2021: Patient seen alongside patient's nurse.  Input from the speech therapy and ENT is highly appreciated.  Patient is producing thick phlegms.  Dietary team also consulted.  Speech evaluation is in progress.  ENT is directing postop care.  Assessment & Plan:   Principal Problem:   Tracheostomy obstruction (HCC) Active Problems:   Tobacco abuse   Protein-calorie malnutrition, severe   Tracheal stenosis following tracheostomy (HCC)   Dehydration   Prerenal azotemia   COPD (chronic obstructive pulmonary disease) (HCC)   Chronic anemia   Tracheostomy obstruction: -Patient has undergone revision.   -Postop management as per ENT.  Severe protein calorie malnutrition: -Likely multifactorial. -Consult dietary team. -Low albumin may have prognostic significance.  COPD: -Stable. -Continue current management.  History of chronic tobacco abuse: -We will counsel patient when feasible.  Chronic anemia: -Stable.  Hypomagnesemia: -IV magnesium 2 g x 1 dose. -Last magnesium was 1.6.   DVT prophylaxis: SCD. Code Status: DNR/DNI Family Communication: Sister Disposition Plan: This will depend on hospital course.  ENT is directing postop management.   Consultants:   ENT  Procedures:   Revision of  tracheostomy.  Antimicrobials:   None   Subjective: Patient is producing thick secretion that is requiring frequent suctioning.    Objective: Vitals:   05/26/21 0732 05/26/21 1108 05/26/21 1404 05/26/21 1504  BP:    120/84  Pulse: (!) 101 (!) 109  (!) 108  Resp: 20 20  20   Temp:    98.7 F (37.1 C)  TempSrc:      SpO2: 98% 99% 100% 99%  Weight:      Height:        Intake/Output Summary (Last 24 hours) at 05/26/2021 1529 Last data filed at 05/26/2021 0300 Gross per 24 hour  Intake --  Output 900 ml  Net -900 ml   Filed Weights   05/24/21 1700  Weight: 40.8 kg    Examination:  General exam: Patient is cachectic.  Patient is not in any distress.  Patient is awake and alert.  Patient has trach in place, with thick secretions. Respiratory system: Clear to auscultation. Cardiovascular system: S1 & S2  Gastrointestinal system: Abdomen is nondistended, soft and nontender. No organomegaly or masses felt. Normal bowel sounds heard. Central nervous system: Awake and alert.  Patient was on extremities.   Extremities: No leg edema.  Data Reviewed: I have personally reviewed following labs and imaging studies  CBC: Recent Labs  Lab 05/24/21 1735 05/25/21 0340 05/26/21 0117  WBC 7.4 6.3 8.5  NEUTROABS 5.5  --  7.1  HGB 9.8* 8.9* 8.4*  HCT 32.2* 29.3* 27.3*  MCV 93.1 93.3 91.6  PLT 413* 442* 517   Basic Metabolic Panel: Recent Labs  Lab 05/24/21 1735 05/25/21 0340 05/26/21 0117  NA 136 135 136  K 4.5 4.2 4.1  CL 92* 96* 102  CO2 32 32 24  GLUCOSE 89 97 114*  BUN 19 12 8   CREATININE 0.51* 0.50* 0.53*  CALCIUM 9.1 8.7* 8.4*  MG 1.7 1.7 1.6*  PHOS 4.3 3.8 3.4   GFR: Estimated Creatinine Clearance: 57.4 mL/min (A) (by C-G formula based on SCr of 0.53 mg/dL (L)). Liver Function Tests: Recent Labs  Lab 05/24/21 1735 05/25/21 0340 05/26/21 0117  AST 34 32  --   ALT 23 22  --   ALKPHOS 57 56  --   BILITOT 1.1 0.9  --   PROT 7.1 6.8  --   ALBUMIN 2.7*  2.4* 2.4*   No results for input(s): LIPASE, AMYLASE in the last 168 hours. No results for input(s): AMMONIA in the last 168 hours. Coagulation Profile: Recent Labs  Lab 05/25/21 0340  INR 1.1   Cardiac Enzymes: No results for input(s): CKTOTAL, CKMB, CKMBINDEX, TROPONINI in the last 168 hours. BNP (last 3 results) No results for input(s): PROBNP in the last 8760 hours. HbA1C: No results for input(s): HGBA1C in the last 72 hours. CBG: No results for input(s): GLUCAP in the last 168 hours. Lipid Profile: No results for input(s): CHOL, HDL, LDLCALC, TRIG, CHOLHDL, LDLDIRECT in the last 72 hours. Thyroid Function Tests: Recent Labs    05/24/21 1736  TSH 4.146   Anemia Panel: Recent Labs    05/25/21 0340  FOLATE 20.6  FERRITIN 455*  TIBC 209*  IRON 31*  RETICCTPCT 3.0   Urine analysis:    Component Value Date/Time   COLORURINE YELLOW 10/17/2011 2052   APPEARANCEUR CLEAR 10/17/2011 2052   LABSPEC 1.024 10/17/2011 2052   PHURINE 6.5 10/17/2011 2052   GLUCOSEU NEGATIVE 10/17/2011 2052   HGBUR TRACE (A) 10/17/2011 2052   BILIRUBINUR NEGATIVE 10/17/2011 2052   KETONESUR NEGATIVE 10/17/2011 2052   PROTEINUR NEGATIVE 10/17/2011 2052   UROBILINOGEN 0.2 10/17/2011 2052   NITRITE NEGATIVE 10/17/2011 2052   LEUKOCYTESUR NEGATIVE 10/17/2011 2052   Sepsis Labs: @LABRCNTIP (procalcitonin:4,lacticidven:4)  ) Recent Results (from the past 240 hour(s))  Resp Panel by RT-PCR (Flu A&B, Covid) Nasopharyngeal Swab     Status: None   Collection Time: 05/24/21  5:35 PM   Specimen: Nasopharyngeal Swab; Nasopharyngeal(NP) swabs in vial transport medium  Result Value Ref Range Status   SARS Coronavirus 2 by RT PCR NEGATIVE NEGATIVE Final    Comment: (NOTE) SARS-CoV-2 target nucleic acids are NOT DETECTED.  The SARS-CoV-2 RNA is generally detectable in upper respiratory specimens during the acute phase of infection. The lowest concentration of SARS-CoV-2 viral copies this assay can  detect is 138 copies/mL. A negative result does not preclude SARS-Cov-2 infection and should not be used as the sole basis for treatment or other patient management decisions. A negative result may occur with  improper specimen collection/handling, submission of specimen other than nasopharyngeal swab, presence of viral mutation(s) within the areas targeted by this assay, and inadequate number of viral copies(<138 copies/mL). A negative result must be combined with clinical observations, patient history, and epidemiological information. The expected result is Negative.  Fact Sheet for Patients:  EntrepreneurPulse.com.au  Fact Sheet for Healthcare Providers:  IncredibleEmployment.be  This test is no t yet approved or cleared by the [place] FDA and  has been authorized for detection and/or diagnosis of SARS-CoV-2 by FDA under an Emergency Use Authorization (EUA). This EUA will remain  in effect (meaning this test can be used) for the duration of the COVID-19 declaration under Section 564(b)(1) of the Act, 21 U.S.C.section 360bbb-3(b)(1), unless the authorization is terminated  or revoked sooner.       Influenza A by PCR NEGATIVE NEGATIVE Final   Influenza B by PCR NEGATIVE NEGATIVE Final    Comment: (NOTE) The Xpert Xpress SARS-CoV-2/FLU/RSV plus assay is intended as an aid in the diagnosis of influenza from Nasopharyngeal swab specimens and should not be used as a sole basis for treatment. Nasal washings and aspirates are unacceptable for Xpert Xpress SARS-CoV-2/FLU/RSV testing.  Fact Sheet for Patients: EntrepreneurPulse.com.au  Fact Sheet for Healthcare Providers: IncredibleEmployment.be  This test is not yet approved or cleared by the [place] FDA and has been authorized for detection and/or diagnosis of SARS-CoV-2 by FDA under an Emergency Use Authorization (EUA). This EUA will remain in  effect (meaning this test can be used) for the duration of the COVID-19 declaration under Section 564(b)(1) of the Act, 21 U.S.C. section 360bbb-3(b)(1), unless the authorization is terminated or revoked.  Performed at [facility], [place] [address] [place], [place] [address]          Radiology Studies: DG CHEST PORT 1 VIEW  Result Date: 05/25/2021 CLINICAL DATA:  Post biopsy EXAM: PORTABLE CHEST 1 VIEW COMPARISON:  May 24, 2021, August 05, 2019 FINDINGS: The cardiomediastinal silhouette is unchanged in contour. Atherosclerotic calcifications of the aorta. Tracheostomy. Biapical pleuroparenchymal thickening, similar in comparison to most recent prior. No pleural effusion. No significant pneumothorax. RIGHT apical irregular opacity, similar in comparison to most recent prior but increased since August 05, 2019. Revisualization of RIGHT greater than LEFT perihilar reticulonodular opacities, mildly increased in comparison to most recent prior. Visualized abdomen is unremarkable. No acute osseous abnormality. IMPRESSION: 1. Mildly increased RIGHT greater than LEFT reticulonodular opacities with differential considerations including aspiration or infection. 2. No significant pneumothorax. Electronically Signed   By: Valentino Saxon MD   On: 05/25/2021 11:52   DG Chest Portable 1 View  Result Date: 05/24/2021 CLINICAL DATA:  Preop. EXAM: PORTABLE CHEST 1 VIEW COMPARISON:  Most recent chest imaging radiograph 08/28/2019. CT 08/17/2020 FINDINGS: Biapical pleuroparenchymal thickening, with thick-walled apical bullous changes and nodularity better appreciated on prior CT. Slightly lower lung volumes from prior exam. There are ill-defined reticulonodular opacities in the right greater than left infrahilar lungs. Heart is normal in size. Slight hilar prominence. No pneumothorax or significant pleural effusion. No acute osseous abnormalities are seen IMPRESSION: 1. Ill-defined reticulonodular opacities  in the right greater than left infrahilar lungs, suspicious for infection, aspiration also considered. 2. Biapical pleuroparenchymal thickening, with thick-walled apical bullous changes and nodularity better appreciated on prior CT. Electronically Signed   By: Keith Rake M.D.   On: 05/24/2021 17:58        Scheduled Meds: . dexamethasone  2 mg Oral BID WC   Followed by  . [START ON 06/01/2021] dexamethasone  2 mg Oral Q breakfast   Followed by  . [START ON 06/08/2021] dexamethasone  1 mg Oral Q breakfast  . ipratropium-albuterol  3 mL Nebulization TID  . pantoprazole sodium  40 mg Per Tube Daily   Continuous Infusions: . sodium chloride 100 mL/hr at 05/26/21 1331     LOS: 2 days    Time spent: 25 minutes.    Dana Allan, MD  [facility] Pager #: [phone] 7PM-7AM contact night coverage as above
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
## 2021-05-26 NOTE — TOC Initial Note (Signed)
Transition of Care [facility]) - Initial/Assessment Note    Patient Details  Name: Perry Waller MRN: [id number] Date of Birth: 01/17/1961  Transition of Care [facility]) CM/SW Contact:    Carles Collet, RN Phone Number: 05/26/2021, 4:33 PM  Clinical Narrative:                Damaris Schooner w patient at bedside.  Discussed disposition plans, consult for SNF placement.  Patient states that he would be agreeable to consider placement for rehab.  Trach placed August 2020, with revision on this admission. 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  He is agreeable to discussing his disposition with his sister.  [place] over the phone. She confirms that the patient lives with her in her house. He has for the past 2 years since he got trach. She states that there is no one to care for him during the day. Although the patient described himself as independently ambulatory prior to admission. I reinforced to [facility] that having a trach would not necessarily mean that he needs to have supervision around him 24/7 if he is able to care for it himself. She continues that he does not properly clean or change out his trach, letting it get clogged. She describes herself as "mentally burnt out" from caring for him because he displays angry defiant behavior. Their  relationship appears contentious.  Of note- outpatient documentation shows that [facility] is scheduled to do a home assessment for PCS services through his Medicaid on June 6th.  Blain Pais understands that placement would require payment through his Medicaid benefits, she is agreeable to surrendering his checks. She would like instate placement and understands options will be limited due to payor source and trach.  Current barrier is thick secretions that is requiring frequent suctioning.         [facility] [place], [place]   [phone]  [place], [place] Niece   [phone]      Expected Discharge Plan: Skilled Nursing Facility Barriers to Discharge: Continued Medical Work up   Patient Goals and CMS Choice Patient states their goals for this hospitalization and ongoing recovery are:: agreeable to short term rehab      Expected Discharge Plan and Services Expected Discharge Plan: Skilled Nursing Facility In-house Referral: Clinical Social Work Discharge Planning Services: CM Consult                                          Prior Living Arrangements/Services   Lives with:: Relatives                   Activities of Daily Living Home Assistive Devices/Equipment: Grab bars around toilet,Walker (specify type) ADL Screening (condition at time of admission) Patient's cognitive ability adequate to safely complete daily activities?: Yes Is the patient deaf or have difficulty hearing?: No Does the patient have difficulty seeing, even when wearing glasses/contacts?: No Does the patient have difficulty concentrating, remembering, or making decisions?: Yes Patient able to express need for assistance with ADLs?: Yes Does the patient have difficulty dressing or bathing?: Yes Independently performs ADLs?: Yes (appropriate for developmental age) Does the patient have difficulty walking or climbing stairs?: Yes Weakness of Legs: Both Weakness of Arms/Hands: None  Permission Sought/Granted                  Emotional Assessment  Admission diagnosis:  Stridor [R06.1] Tracheostomy obstruction [facility]) [J95.09] Patient Active Problem List   Diagnosis Date Noted  . Dehydration 05/25/2021  . Prerenal azotemia 05/25/2021  . COPD (chronic obstructive pulmonary disease) ([place])   . Chronic anemia   . Tracheostomy obstruction ([place]) 05/24/2021  . Abnormal CT of the chest 10/13/2020  . Tracheostomy care ([place]) 10/13/2020  . 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 Centrilobular emphysema ([place]) 01/18/2020  . Palliative  care encounter   . DNR (do not resuscitate)   . Goals of care, counseling/discussion   . 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 Protein-calorie malnutrition, severe 08/07/2019  . Tobacco abuse 08/05/2019  . Constipation 08/05/2019   PCP:  Pcp, No Pharmacy:   [facility] [place] [address] [place] [address] Phone: [phone] Fax: [phone]  [place] ([place] - [address] [place] [address] Phone: [phone] Fax: [phone]  [id number] - [place], [place] - [address] [place] [address] Phone: [phone] Fax: [phone]     Social Determinants of Health (SDOH) Interventions    Readmission Risk Interventions No flowsheet data found.

## 2021-05-26 NOTE — Progress Notes (Signed)
ENT PROGRESS NOTE   Subjective: Patient seen and examined at bedside. No issues overnight. Patient has been seen by SLP, currently stable on trach collar with PMV in place. Denies shortness of breath. States pain controlled.   Objective: Vital signs in last 24 hours: Temp:  [97.5 F (36.4 C)-98 F (36.7 C)] 98 F (36.7 C) (05/29 0342) Pulse Rate:  [84-109] 109 (05/29 1108) Resp:  [16-25] 20 (05/29 1108) BP: (93-119)/(71-83) 119/78 (05/29 0342) SpO2:  [92 %-99 %] 99 % (05/29 1108) FiO2 (%):  [28 %] 28 % (05/29 1108)  CONSTITUTIONAL: Thin, frail appearing male, no distress and alert and oriented x 3 PULMONARY/CHEST [place]: on trach collar HENT: Head : normocephalic and atraumatic Ears: Right ear:   canal normal, external ear normal and hearing normal Left ear:   canal normal, external ear normal and hearing normal Nose: nose normal and no purulence EYES: conjunctiva normal, EOM normal and PERRL NECK: Shiley 4-0 tracheostomy in place, secured with sutures and Posey trach tie, no active bleeding around trach.   Lab Results  Component Value Date   WBC 8.5 05/26/2021   HGB 8.4 (L) 05/26/2021   HCT 27.3 (L) 05/26/2021   MCV 91.6 05/26/2021   PLT 375 05/26/2021    Recent Labs    05/25/21 0340 05/26/21 0117  NA 135 136  K 4.2 4.1  CL 96* 102  CO2 32 24  GLUCOSE 97 114*  BUN 12 8  CREATININE 0.50* 0.53*  CALCIUM 8.7* 8.4*    Medications: I have reviewed the patient's current medications.  New Imaging:  EXAM: PORTABLE CHEST 1 VIEW  COMPARISON:  May 24, 2021, August 05, 2019  FINDINGS: The cardiomediastinal silhouette is unchanged in contour. Atherosclerotic calcifications of the aorta. Tracheostomy. Biapical pleuroparenchymal thickening, similar in comparison to most recent prior. No pleural effusion. No significant pneumothorax. RIGHT apical irregular opacity, similar in comparison to most recent prior but increased since August 05, 2019. Revisualization of RIGHT  greater than LEFT perihilar reticulonodular opacities, mildly increased in comparison to most recent prior. Visualized abdomen is unremarkable. No acute osseous abnormality.  IMPRESSION: 1. Mildly increased RIGHT greater than LEFT reticulonodular opacities with differential considerations including aspiration or infection. 2. No significant pneumothorax.  Pathology: Pending   Assessment/Plan: Perry Waller is a 60 y/o M with history of laryngeal cancer, status post chemo and XRT, status post trach in 2020 with subsequent decannulation presenting with stridor, now POD #1 s/p tracheostomy and direct laryngoscopy and biopsy.  Plan: -Continue routine trach care -Do NOT cut trach sutures or ties, first trach change will be performed by ENT on POD #5 to cuffless trach if patient continues to be stable on trach collar -Please keep obturator taped to [facility] -SLP following, esophagram results pending -Will follow up results of operative biopsy -Recommend social work consultation, patient will likely require placement after discharge for care management    LOS: 2 days    Jason Coop, [place] [facility] 05/26/2021, 11:25 AM

## 2021-05-26 NOTE — Progress Notes (Signed)
Modified Barium Swallow Progress Note - no barium given, gastrografin  Patient Details  Name: Perry Waller MRN: [id number] Date of Birth: Jan 26, 1961  Today's Date: 05/26/2021  Modified Barium Swallow completed.  Full report located under Chart Review in the Imaging Section.  Brief recommendations include the following:  Clinical Impression  Limited study given ENT order for gastrografin only due to potential concern for tracheal tissue injury. Contrast thickened with thickener, but liquid textures somewhat different that typical nectar/honey. [name] in place. Pt demonstrates a moderate oropharyngeal dysphagia with decreased mobility of velopharynx, base of tongue, epiglottis and hyoid excursion. Pt does have pharyngeal stripping and laryngeal elevation and some ariway protection during the swallow though the epiglottis does not invert and a small amount of the bolus enters the nasopharynx during peristalsis. There is moderate residue with thin liquids in the valleculae and pyriform sinuses that spills into the vestibule and trachea post swallow without cough response. Cues for a throat clear do not fully eject aspirate.  Anatomical changes there consistent with laryngoscopy report of partially obstructing tissue. Trials of honey thickened gastrografin, puree and solids are tolerated with minimal residue that does not spill into airway before a second swallow that clears well. Recommend pt consume honey thick liquids, mechanical soft solids and meds crushed in puree. Intake expected to be limited, pt would benefit from consideration of long term alternate nutrition. Repeat MBS with barium recommended as pt recovers.   Swallow Evaluation Recommendations       SLP Diet Recommendations: Dysphagia 3 (Mech soft) solids;Honey thick liquids   Liquid Administration via: Cup;Straw;Spoon   Medication Administration: Crushed with puree       Compensations: Slow rate;Small sips/bites   Postural Changes:  Seated upright at 90 degrees            Perry Waller, Perry Waller 05/26/2021,1:40 PM

## 2021-05-26 NOTE — Consult Note (Signed)
Consultation Note Date: 05/26/2021   Patient Name: Perry Waller  DOB: 01/12/1961  MRN: [id number]  Age / Sex: 60 y.o., male  PCP: Pcp, No Referring Physician: Bonnell Public, MD  Reason for Consultation: Establishing goals of care and Psychosocial/spiritual support  HPI/Patient Profile: 60 y.o. male   admitted on 05/24/2021 with past   medical history significant for stage IV laryngeal cancer status post chemotherapy and radiation, status post tracheostomy in August 2020, COPD, chronic tobacco abuse, severe protein calorie malnutrition, chronic anemia with baseline hemoglobin 10-12, who is [facility] to [facility] on 05/24/2021 with tracheostomy obstruction after presenting from ENT office for tracheostomy revision procedure.   Patient was diagnosed with stage IV laryngeal cancer in August 2020, and is undergone 2 chemotherapy radiation.  He is status post tracheostomy in August 2020.  However, approximately 8 months ago he began to develop near constant stridor associated with his tracheostomy, and has been routinely following up in ENT clinic for close monitoring of this.  No reported evidence of interval progression of the stridor, but in the setting of persistence of this finding, without any degree of improvement over the last 8 months, the patient was sent to [facility] emergency department from the office of his outpatient ENT, Dr. Fredric Dine, for nonemergent tracheostomy revision.  Of note, the patient also underwent G-tube placement in August 2020, with subsequent removal of G-tube in September 2021.  Subsequently he has been able to maintain p.o. intake, without prior need for replacement of G-tube.    Patient reports continued unintentional weight loss last several months.   He most recently followed up with his radiation oncologist, Dr. Isidore Moos, on 05/20/2021, who, in the setting of persistence of stridor  associated with tracheostomy, ordered a dexamethasone taper.  He has not had any blood thinners as an outpatient, including no aspirin.  Patient and family face treatment option decisions, advanced directive decisions and anticipatory care needs.   Clinical Assessment and Goals of Care:   This NP Wadie Lessen reviewed medical records, received report from team, assessed the patient and then meet at the patient's bedside  along with his sister/ Theophilus Bones to discuss diagnosis, prognosis, GOC, EOL wishes disposition and options.   Concept of Palliative Care was introduced as specialized medical care for people and their families living with serious illness.  If focuses on providing relief from the symptoms and stress of a serious illness.  The goal is to improve quality of life for both the patient and the family.   Values and goals of care important to patient and family were attempted to be elicited.    A  discussion was had today regarding advanced directives.  Concepts specific to code status, artifical feeding and hydration, continued IV antibiotics and rehospitalization was had.    The difference between a aggressive medical intervention path  and a palliative comfort care path for this patient at this time was had.    Created space and opportunity for Mr. Folts to explore his thoughts and feelings regarding his current medical situation.  He easily becomes frustrated telling me that nobody is "really helping him".  He needs more help in the home.   MOST form introduced     Questions and concerns addressed.  Patient  encouraged to call with questions or concerns.     PMT will continue to support holistically.          PATIENT    SUMMARY OF RECOMMENDATIONS    Code Status/Advance  Care Planning:  DNR   Palliative Prophylaxis:   Aspiration, Bowel Regimen, Delirium Protocol, Frequent Pain Assessment and Oral Care  Additional Recommendations (Limitations, Scope,  Preferences):  Full Scope Treatment  Psycho-social/Spiritual:   Desire for further Chaplaincy support:no   Prognosis:   Unable to determine  Discharge Planning: Outside the room sister tells me she cannot care for Mr Gary in her home moving forward.  She tells me that Mr. Helzer continues to smoke and drink alcohol, and  is unappreciative.  He is requiring more nursing care and it is not for safe for him to be alone all day when members of the family all work dayshift jobs.    To Be Determined      Primary Diagnoses: Present on Admission: . Tracheostomy obstruction ([place]) . Protein-calorie malnutrition, severe . Dehydration . Prerenal azotemia . Tobacco abuse . 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 COPD (chronic obstructive pulmonary disease) ([place]) . Chronic anemia   I have reviewed the medical record, interviewed the patient and family, and examined the patient. The following aspects are pertinent.  Past Medical History:  Diagnosis Date  . Acute respiratory acidosis 06/29/2019  . Acute respiratory failure with hypoxia ([place]) 06/30/2019  . Arthritis   . Chronic anemia    baseline hgb 10-12  . COPD (chronic obstructive pulmonary disease) ([place])   . History of radiation therapy 08/17/19- 10/10/19   Larynx treated with 35 fractions of 2 Gy each to total 70 Gy.   Marland Kitchen Neck mass 08/05/2019   Social History   Socioeconomic History  . Marital status: Single    Spouse name: Not on file  . Number of children: Not on file  . Years of education: Not on file  . Highest education level: Not on file  Occupational History  . Not on file  Tobacco Use  . Smoking status: Current Every Day Smoker    Packs/day: 0.50    Years: 45.00    Pack years: 22.50    Types: Cigarettes  . Smokeless tobacco: Never Used  . Tobacco comment: he reports smoking 5 cigarettes daily  Vaping Use  . Vaping Use: Never used  Substance and Sexual Activity  . Alcohol use: Not Currently     Comment: no etoh in 3 months  . Drug use: No  . Sexual activity: Not on file  Other Topics Concern  . Not on file  Social History Narrative   Patient is new to [facility], he moved from [place] two weeks ago.    Social Determinants of Health   Financial Resource Strain: Not on file  Food Insecurity: Not on file  Transportation Needs: Not on file  Physical Activity: Not on file  Stress: Not on file  Social Connections: Not on file   Family History  Problem Relation Age of Onset  . Cancer Father   . Cancer Brother    Scheduled Meds: . dexamethasone  2 mg Oral BID WC   Followed by  . [START ON 06/01/2021] dexamethasone  2 mg Oral Q breakfast   Followed by  . [START ON 06/08/2021] dexamethasone  1 mg Oral Q breakfast  . ipratropium-albuterol  3 mL Nebulization TID  . pantoprazole sodium  40 mg Per Tube Daily   Continuous Infusions: . sodium chloride 100 mL/hr at 05/26/21 1331   PRN Meds:.acetaminophen **OR** acetaminophen, nicotine Medications Prior to Admission:  Prior to Admission medications   Medication Sig Start Date End Date Taking? Authorizing Provider  naproxen sodium (ALEVE) 220  MG tablet Take 440 mg by mouth daily as needed (pain/headache).   Yes [provider]  Nutritional Supplements (ENSURE ORIGINAL PO) Take 237 mLs by mouth 6 (six) times daily. chocolate   Yes [provider]  polyethylene glycol powder (GLYCOLAX/MIRALAX) 17 GM/SCOOP powder Take 17 g by mouth 2 (two) times daily as needed. Patient taking differently: Take 17 g by mouth 2 (two) times daily as needed for mild constipation. 11/20/20  Yes Dorena Dew, FNP  acetaminophen (TYLENOL) 160 MG/5ML liquid Take 19.9 mLs (636.8 mg total) by mouth every 4 (four) hours as needed for fever. Patient not taking: Reported on 05/24/2021 11/20/20   Dorena Dew, FNP  dexamethasone (DECADRON) 0.5 MG/5ML solution Take 40 mL BID through 5/27. On 5/28, taper to 66mL BID. On 6/4, taper to 19mL in the  AM only. On 6/11, taper to 30mL in the AM only. Continue through 6/17. Take w/ food. Patient not taking: Reported on 05/24/2021 05/20/21   Eppie Gibson, MD  feeding supplement, ENSURE COMPLETE, (ENSURE COMPLETE) LIQD Take 237 mLs by mouth 3 (three) times daily between meals. Patient not taking: Reported on 05/24/2021 03/12/20 07/22/29  Dorena Dew, FNP  ipratropium-albuterol (DUONEB) 0.5-2.5 (3) MG/3ML SOLN Take 3 mLs by nebulization in the morning, at noon, and at bedtime. Patient not taking: Reported on 05/24/2021 09/28/20   Margaretha Seeds, MD  omeprazole (PRILOSEC) 20 MG capsule Take 2 capsules (40 mg total) by mouth daily. Patient not taking: Reported on 05/24/2021 10/12/20   Margaretha Seeds, MD  Tiotropium Bromide-Olodaterol (STIOLTO RESPIMAT) 2.5-2.5 MCG/ACT AERS Inhale 2 puffs into the lungs daily. Patient not taking: Reported on 05/24/2021 09/28/20   Margaretha Seeds, MD   Allergies  Allergen Reactions  . Fish Allergy Other (See Comments)    Breaks out   Review of Systems  Constitutional: Positive for fatigue.    Physical Exam Constitutional:      Appearance: He is cachectic. He is ill-appearing.  Cardiovascular:     Rate and Rhythm: Normal rate.  Skin:    General: Skin is warm and dry.  Neurological:     Mental Status: He is alert.     Vital Signs: BP 119/78 (BP Location: Right Arm)   Pulse (!) 109   Temp 98 F (36.7 C) (Oral)   Resp 20   Ht 5\' 6"  (1.676 m)   Wt 40.8 kg   SpO2 99%   BMI 14.53 kg/m  Pain Scale: 0-10   Pain Score: 0-No pain   SpO2: SpO2: 99 % O2 Device:SpO2: 99 % O2 Flow Rate: .O2 Flow Rate (L/min): 5 L/min  IO: Intake/output summary:   Intake/Output Summary (Last 24 hours) at 05/26/2021 1356 Last data filed at 05/26/2021 0300 Gross per 24 hour  Intake --  Output 900 ml  Net -900 ml    LBM: Last BM Date: 05/22/21 Baseline Weight: Weight: 40.8 kg Most recent weight: Weight: 40.8 kg     Palliative Assessment/Data:   40 %     Discussed with bedside RN  Time In: 1600 Time Out: 1710 Time Total: 70 minutes Greater than 50%  of this time was spent counseling and coordinating care related to the above assessment and plan.  Signed by: Wadie Lessen, NP   Please contact Palliative Medicine Team phone at [phone] for questions and concerns.  For individual provider: See Shea Evans

## 2021-05-27 DIAGNOSIS — E43 Unspecified severe protein-calorie malnutrition: Secondary | ICD-10-CM | POA: Diagnosis not present

## 2021-05-27 DIAGNOSIS — J9509 Other tracheostomy complication: Secondary | ICD-10-CM | POA: Diagnosis not present

## 2021-05-27 DIAGNOSIS — R634 Abnormal weight loss: Secondary | ICD-10-CM | POA: Diagnosis not present

## 2021-05-27 DIAGNOSIS — R531 Weakness: Secondary | ICD-10-CM | POA: Diagnosis not present

## 2021-05-27 MED ORDER — ENSURE ENLIVE PO LIQD
237.0000 mL | Freq: Two times a day (BID) | ORAL | Status: DC
  Administered 2021-05-28 – 2021-06-13 (×31): 237 mL via ORAL
  Filled 2021-05-27 (×6): qty 237

## 2021-05-27 MED ORDER — ADULT MULTIVITAMIN W/MINERALS CH
1.0000 | ORAL_TABLET | Freq: Every day | ORAL | Status: DC
  Administered 2021-05-27 – 2021-06-13 (×18): 1 via ORAL
  Filled 2021-05-27 (×18): qty 1

## 2021-05-27 NOTE — Progress Notes (Signed)
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  Patient was admitted from the ENTs office with concerns for tracheostomy obstruction and plans for tracheostomy revision procedure.  Patient has undergone revision of the tracheostomy.  Patient is currently in PACU.  Patient's sister has been updated.  No significant history from patient.  05/26/2021: Patient seen alongside patient's nurse.  Input from the speech therapy and ENT is highly appreciated.  Patient is producing thick phlegms.  Dietary team also consulted.  Speech evaluation is in progress.  ENT is directing postop care.  05/27/2021: Patient seen.  Input from the dietary team is highly appreciated.  There will be need to address goal of care.  OT has recommended skilled nursing facility placement.  Will consult transition of care team.  Assessment & Plan:   Principal Problem:   Tracheostomy obstruction ([place]) Active Problems:   Tobacco abuse   Protein-calorie malnutrition, severe   Tracheal stenosis following tracheostomy (HCC)   Dehydration   Prerenal azotemia   COPD (chronic obstructive pulmonary disease) (HCC)   Chronic anemia   Tracheostomy obstruction: -Patient has undergone revision.   -Postop management as per ENT.  Severe protein calorie malnutrition: -Likely multifactorial. -Consult dietary team. -Low albumin may have prognostic significance. 05/27/2021: Placement of PEG tube will depend on goal of care.  Awaiting palliative care input.  Patient also has history of advanced cancer.  COPD: -Stable. -Continue current management.  History of chronic tobacco abuse: -We will counsel  patient when feasible.  Chronic anemia: -Stable.  Hypomagnesemia: -IV magnesium 2 g x 1 dose. -Last magnesium was 1.6. 05/27/2021: Repeat levels in the morning.   DVT prophylaxis: SCD. Code Status: DNR/DNI Family Communication: Sister Disposition Plan: This will depend on hospital course.  ENT is directing postop management.  OT has recommended skilled nursing facility.  We will consult transition of care team.   Consultants:   ENT  Palliative care team.  Procedures:   Revision of tracheostomy.  Antimicrobials:   None   Subjective: Patient looks much better today. Secretions are much less today.  Objective: Vitals:   05/27/21 0411 05/27/21 0529 05/27/21 0731 05/27/21 1106  BP:  123/81    Pulse: 90 85 93 90  Resp: 17 20 18 16   Temp:  97.9 F (36.6 C)    TempSrc:      SpO2: 98% 100% 100% 98%  Weight:      Height:        Intake/Output Summary (Last 24 hours) at 05/27/2021 1237 Last data filed at 05/27/2021 0319 Gross per 24 hour  Intake --  Output 2500 ml  Net -2500 ml   Filed Weights   05/24/21 1700  Weight: 40.8 kg    Examination:  General exam: Patient is cachectic.  Patient is not in any distress.  Patient is awake and alert.  Patient has trach in place, with thick secretions. Respiratory system: Clear to auscultation, decreased air entry. Cardiovascular system: S1 & S2  Gastrointestinal system: Abdomen is nondistended, soft and nontender. No organomegaly or masses felt. Normal bowel sounds heard. Central nervous system: Awake and alert.  Patient moves all extremities.   Extremities: No leg edema.  Data Reviewed: I have personally reviewed  following labs and imaging studies  CBC: Recent Labs  Lab 05/24/21 1735 05/25/21 0340 05/26/21 0117  WBC 7.4 6.3 8.5  NEUTROABS 5.5  --  7.1  HGB 9.8* 8.9* 8.4*  HCT 32.2* 29.3* 27.3*  MCV 93.1 93.3 91.6  PLT 413* 442* 858   Basic Metabolic Panel: Recent Labs  Lab 05/24/21 1735 05/25/21 0340  05/26/21 0117  NA 136 135 136  K 4.5 4.2 4.1  CL 92* 96* 102  CO2 32 32 24  GLUCOSE 89 97 114*  BUN 19 12 8   CREATININE 0.51* 0.50* 0.53*  CALCIUM 9.1 8.7* 8.4*  MG 1.7 1.7 1.6*  PHOS 4.3 3.8 3.4   GFR: Estimated Creatinine Clearance: 57.4 mL/min (A) (by C-G formula based on SCr of 0.53 mg/dL (L)). Liver Function Tests: Recent Labs  Lab 05/24/21 1735 05/25/21 0340 05/26/21 0117  AST 34 32  --   ALT 23 22  --   ALKPHOS 57 56  --   BILITOT 1.1 0.9  --   PROT 7.1 6.8  --   ALBUMIN 2.7* 2.4* 2.4*   No results for input(s): LIPASE, AMYLASE in the last 168 hours. No results for input(s): AMMONIA in the last 168 hours. Coagulation Profile: Recent Labs  Lab 05/25/21 0340  INR 1.1   Cardiac Enzymes: No results for input(s): CKTOTAL, CKMB, CKMBINDEX, TROPONINI in the last 168 hours. BNP (last 3 results) No results for input(s): PROBNP in the last 8760 hours. HbA1C: No results for input(s): HGBA1C in the last 72 hours. CBG: No results for input(s): GLUCAP in the last 168 hours. Lipid Profile: No results for input(s): CHOL, HDL, LDLCALC, TRIG, CHOLHDL, LDLDIRECT in the last 72 hours. Thyroid Function Tests: Recent Labs    05/24/21 1736  TSH 4.146   Anemia Panel: Recent Labs    05/25/21 0340  FOLATE 20.6  FERRITIN 455*  TIBC 209*  IRON 31*  RETICCTPCT 3.0   Urine analysis:    Component Value Date/Time   COLORURINE YELLOW 10/17/2011 2052   APPEARANCEUR CLEAR 10/17/2011 2052   LABSPEC 1.024 10/17/2011 2052   PHURINE 6.5 10/17/2011 2052   GLUCOSEU NEGATIVE 10/17/2011 2052   HGBUR TRACE (A) 10/17/2011 2052   BILIRUBINUR NEGATIVE 10/17/2011 2052   KETONESUR NEGATIVE 10/17/2011 2052   PROTEINUR NEGATIVE 10/17/2011 2052   UROBILINOGEN 0.2 10/17/2011 2052   NITRITE NEGATIVE 10/17/2011 2052   LEUKOCYTESUR NEGATIVE 10/17/2011 2052   Sepsis Labs: @LABRCNTIP (procalcitonin:4,lacticidven:4)  ) Recent Results (from the past 240 hour(s))  Resp Panel by RT-PCR  (Flu A&B, Covid) Nasopharyngeal Swab     Status: None   Collection Time: 05/24/21  5:35 PM   Specimen: Nasopharyngeal Swab; Nasopharyngeal(NP) swabs in vial transport medium  Result Value Ref Range Status   SARS Coronavirus 2 by RT PCR NEGATIVE NEGATIVE Final    Comment: (NOTE) SARS-CoV-2 target nucleic acids are NOT DETECTED.  The SARS-CoV-2 RNA is generally detectable in upper respiratory specimens during the acute phase of infection. The lowest concentration of SARS-CoV-2 viral copies this assay can detect is 138 copies/mL. A negative result does not preclude SARS-Cov-2 infection and should not be used as the sole basis for treatment or other patient management decisions. A negative result may occur with  improper specimen collection/handling, submission of specimen other than nasopharyngeal swab, presence of viral mutation(s) within the areas targeted by this assay, and inadequate number of viral copies(<138 copies/mL). A negative result must be combined with clinical observations, patient history, and epidemiological information. The expected result is  Negative.  Fact Sheet for Patients:  EntrepreneurPulse.com.au  Fact Sheet for Healthcare Providers:  IncredibleEmployment.be  This test is no t yet approved or cleared by the [place] FDA and  has been authorized for detection and/or diagnosis of SARS-CoV-2 by FDA under an Emergency Use Authorization (EUA). This EUA will remain  in effect (meaning this test can be used) for the duration of the COVID-19 declaration under Section 564(b)(1) of the Act, 21 U.S.C.section 360bbb-3(b)(1), unless the authorization is terminated  or revoked sooner.       Influenza A by PCR NEGATIVE NEGATIVE Final   Influenza B by PCR NEGATIVE NEGATIVE Final    Comment: (NOTE) The Xpert Xpress SARS-CoV-2/FLU/RSV plus assay is intended as an aid in the diagnosis of influenza from Nasopharyngeal swab specimens  and should not be used as a sole basis for treatment. Nasal washings and aspirates are unacceptable for Xpert Xpress SARS-CoV-2/FLU/RSV testing.  Fact Sheet for Patients: EntrepreneurPulse.com.au  Fact Sheet for Healthcare Providers: IncredibleEmployment.be  This test is not yet approved or cleared by the [place] FDA and has been authorized for detection and/or diagnosis of SARS-CoV-2 by FDA under an Emergency Use Authorization (EUA). This EUA will remain in effect (meaning this test can be used) for the duration of the COVID-19 declaration under Section 564(b)(1) of the Act, 21 U.S.C. section 360bbb-3(b)(1), unless the authorization is terminated or revoked.  Performed at [facility], [employer] [address] [place], [place] [address]          Radiology Studies: No results found.      Scheduled Meds: . dexamethasone  2 mg Oral BID WC   Followed by  . [START ON 06/01/2021] dexamethasone  2 mg Oral Q breakfast   Followed by  . [START ON 06/08/2021] dexamethasone  1 mg Oral Q breakfast  . feeding supplement  237 mL Oral BID BM  . ipratropium-albuterol  3 mL Nebulization TID  . multivitamin with minerals  1 tablet Oral Daily  . pantoprazole sodium  40 mg Per Tube Daily   Continuous Infusions: . sodium chloride 100 mL/hr at 05/27/21 0602     LOS: 3 days    Time spent: 25 minutes.    Dana Allan, MD  [facility] Pager #: [phone] 7PM-7AM contact night coverage as above

## 2021-05-27 NOTE — Progress Notes (Signed)
    Progress Note from the Palliative Medicine Team at [facility]   Patient Name: Perry Waller        Date: 05/27/2021 DOB: 1961-06-06  Age: 60 y.o. MRN#: [id number] Attending Physician: Bonnell Public, MD Primary Care Physician: Kathyrn Lass Admit Date: 05/24/2021   Medical records reviewed   60 year old male admitted on 24 from ENT clinic with tracheostomy obstruction.  He is now status post tracheostomy revision.  Past medical history significant for stage IV laryngeal cancer status post chemo and radiation.  Trach placement on 8 of 2020  Past medical history significant for COPD, tobacco use, alcohol abuse, severe protein calorie malnutrition.  This NP visited patient at the bedside as a follow up to  yesterday's [place].  Created space and opportunity for patient to explore his thoughts and feelings regarding his current medical situation.  We discussed the fact that his sister can no longer care for him in her home.    Mr. Thornton seems somewhat confused by this information.  We discussed his increasing care needs and safety issues at home.  Patient will need SNF placement    Recommend outpatient community-based palliative on discharge  MOST form completed   Discussed with patient the importance of continued conversation with his medical providers regarding overall plan of care and treatment options,  ensuring decisions are within the context of the patients values and GOCs.  Questions and concerns addressed   Discussed with LCSW  Total time spent on the unit was 35 minutes  Greater than 50% of the time was spent in counseling and coordination of care  Wadie Lessen NP  Palliative Medicine Team Team Phone # [phone] Pager [phone]

## 2021-05-27 NOTE — Evaluation (Signed)
Occupational Therapy Evaluation Patient Details Name: Perry Waller MRN: [id number] DOB: 1961/12/20 Today's Date: 05/27/2021    History of Present Illness 60 y.o. male presenting on 5/27 from ENT clinic with tracheostomy obstruction. Patient transferred to [facility] for tracheostomy revision. 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   Clinical Impression   PTA patient was living with his sister in a private residence and reports requiring assistance with all ADLs/IADLs at baseline. Patient reports ambulating without use of AD. Patient currently functioning below baseline demonstrating observed ADLs including LB dressing and functional transfers with Min guard to Min A. Patient also limited by deficits listed below including generalized weakness and decreased activity tolerance and would benefit from continued acute OT services in prep for safe d/c to next level of care. Per med chart, sister unable to continue providing necessary level of care for this patient with request for SNF rehab and eventual transition to long-term care. OT will continue to follow acutely.      Follow Up Recommendations  SNF;Supervision/Assistance - 24 hour    Equipment Recommendations  Other (comment) (TBD)    Recommendations for Other Services       Precautions / Restrictions Precautions Precautions: Fall Restrictions Weight Bearing Restrictions: No      Mobility Bed Mobility Overal bed mobility: Needs Assistance Bed Mobility: Supine to Sit     Supine to sit: Supervision     General bed mobility comments: Supervision A for safety.    Transfers Overall transfer level: Needs assistance Equipment used: 1 person hand held assist Transfers: Sit to/from Stand Sit to Stand: Min guard         General transfer comment: Min guard for safety. Min A for lateral steps toward [facility] with R lateral LOB.     Balance Overall balance assessment: Needs assistance Sitting-balance support: Single extremity supported;No upper extremity supported;Feet supported Sitting balance-Leahy Scale: Fair     Standing balance support: Single extremity supported;During functional activity Standing balance-Leahy Scale: Poor Standing balance comment: Reliant on external assist to correct LOB with lateral steps toward HOB.                           ADL either performed or assessed with clinical judgement   ADL Overall ADL's : Needs assistance/impaired Eating/Feeding: Set up                   Lower Body Dressing: Minimal assistance;Sit to/from stand   Toilet Transfer: Magazine features editor Details (indicate cue type and reason): Simulated with lateral steps toward HOB. Patient declined transfer to recliner.         Functional mobility during ADLs: Minimal assistance General ADL Comments: Patient limited by generalized weakness and decreased balance indicating increased fall risk.     Vision Patient Visual Report: No change from baseline       Perception     Praxis      Pertinent Vitals/Pain Pain Assessment: 0-10 Pain Score: 7  Pain Location: Toes bilaterally Pain Descriptors / Indicators: Numbness;Tingling Pain Intervention(s): Monitored during session;Limited activity within patient's tolerance     Hand Dominance     Extremity/Trunk Assessment Upper Extremity Assessment Upper Extremity Assessment: Generalized weakness (Reports numbness/tingling in fingers/toes bilaterally)   Lower Extremity Assessment Lower Extremity Assessment: Defer to PT evaluation       Communication Communication Communication: Tracheostomy;Passy-Muir valve;Other (comment) (Difficult to understand)  Cognition Arousal/Alertness: Awake/alert Behavior During Therapy: WFL for tasks assessed/performed Overall Cognitive Status: Impaired/Different from baseline Area of Impairment:  Memory;Safety/judgement                     Memory: Decreased short-term memory   Safety/Judgement: Decreased awareness of safety     General Comments: Patient A&Ox3 to person, place and situation. Patient is quick to anger. Low frustration tolerance.   General Comments  VSS on 28% FiO2 on 10L    Exercises     Shoulder Instructions      Home Living Family/patient expects to be discharged to:: Skilled nursing facility Living Arrangements: Other relatives (Living with sister prior to this admission. Sister unable to continue caring for this patient with desire for LTC.)                           Home Equipment: Shower seat;Other (comment) (Home O2)          Prior Functioning/Environment Level of Independence: Needs assistance  Gait / Transfers Assistance Needed: Household and community mobility without AD. ADL's / Homemaking Assistance Needed: Patient reports requiring assist from sister with "all" ADLs            OT Problem List: Decreased strength;Decreased activity tolerance;Impaired balance (sitting and/or standing);Decreased safety awareness;Decreased knowledge of use of DME or AE;Impaired sensation      OT Treatment/Interventions: Self-care/ADL training;Therapeutic exercise;DME and/or AE instruction;Therapeutic activities;Patient/family education;Balance training    OT Goals(Current goals can be found in the care plan section) Acute Rehab OT Goals Patient Stated Goal: "To get up outta here" OT Goal Formulation: With patient Time For Goal Achievement: 06/10/21 Potential to Achieve Goals: Good ADL Goals Pt Will Perform Grooming: with supervision;standing Pt Will Perform Upper Body Dressing: sitting;with set-up Pt Will Perform Lower Body Dressing: with supervision;sit to/from stand Pt Will Transfer to Toilet: with supervision;ambulating Pt Will Perform Toileting - Clothing Manipulation and hygiene: with supervision;sit to/from  stand Pt/caregiver will Perform Home Exercise Program: Both right and left upper extremity;With written HEP provided  OT Frequency: Min 2X/week   Barriers to D/C:            Co-evaluation              AM-PAC OT "6 Clicks" Daily Activity     Outcome Measure Help from another person eating meals?: A Little Help from another person taking care of personal grooming?: A Little Help from another person toileting, which includes using toliet, bedpan, or urinal?: A Little Help from another person bathing (including washing, rinsing, drying)?: A Little Help from another person to put on and taking off regular upper body clothing?: A Little Help from another person to put on and taking off regular lower body clothing?: A Little 6 Click Score: 18   End of Session Equipment Utilized During Treatment: Gait belt;Oxygen Nurse Communication: Mobility status  Activity Tolerance: Patient tolerated treatment well Patient left: in bed;with call bell/phone within reach;with bed alarm set  OT Visit Diagnosis: Unsteadiness on feet (R26.81);Muscle weakness (generalized) (M62.81)                Time: 6195-0932 OT Time Calculation (min): 24 min Charges:  OT General Charges $OT Visit: 1 Visit OT Evaluation $OT Eval Moderate Complexity: 1 Mod OT Treatments $Therapeutic Activity: 8-22 mins  Keidrick Murty H. OTR/L Supplemental OT, Department of rehab services [phone]  Kashaun Bebo R H. 05/27/2021, 9:06 AM

## 2021-05-27 NOTE — Evaluation (Signed)
Physical Therapy Evaluation Patient Details Name: Perry Waller MRN: [id number] DOB: 07-16-1961 Today's Date: 05/27/2021   History of Present Illness  60 y.o. male presenting on 5/27 from ENT clinic with tracheostomy obstruction. Patient transferred to [facility] for tracheostomy revision. 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  Clinical Impression  Pt admitted with/for with trach obstruction.  Pt overall generally weak and needing min guard to min assist for basic mobility and gait.  Pt currently limited functionally due to the problems listed. ( See problems list.)   Pt will benefit from PT to maximize function and safety in order to get ready for next venue listed below.     Follow Up Recommendations SNF;Other (comment) (possible LTC)    Equipment Recommendations  None recommended by PT (TBA at venue)    Recommendations for Other Services       Precautions / Restrictions Precautions Precautions: Fall      Mobility  Bed Mobility Overal bed mobility: Needs Assistance Bed Mobility: Supine to Sit     Supine to sit: Supervision     General bed mobility comments: Supervision A for safety.    Transfers Overall transfer level: Needs assistance Equipment used: 1 person hand held assist Transfers: Sit to/from Stand Sit to Stand: Min guard         General transfer comment: Min guard for safety.  Ambulation/Gait Ambulation/Gait assistance: Min assist Gait Distance (Feet): 110 Feet Assistive device: None;IV Pole Gait Pattern/deviations: Step-through pattern;Decreased stride length   Gait velocity interpretation: <1.8 ft/sec, indicate of risk for recurrent falls General Gait Details: unsteady gait with mild stagger and drift needing minimal support.  Pt didn't want assist so had pt push IV pole and although mildly unsteady, pt could maintain balance enough to be safe  with guard assist  Stairs            Wheelchair Mobility    Modified Rankin (Stroke Patients Only)       Balance   Sitting-balance support: Single extremity supported;No upper extremity supported;Feet supported Sitting balance-Leahy Scale: Fair     Standing balance support: Single extremity supported;During functional activity Standing balance-Leahy Scale: Poor Standing balance comment: can maintain static stance without UE's, but needing AD or external support dynamically.                             Pertinent Vitals/Pain Pain Assessment: 0-10 Pain Score: 7  Pain Location: Toes bilaterally Pain Descriptors / Indicators: Numbness;Tingling Pain Intervention(s): Monitored during session    Home Living Family/patient expects to be discharged to:: Skilled nursing facility Living Arrangements: Other relatives (Living with sister prior to this admission. Sister unable to continue caring for this patient with desire for LTC.)             Home Equipment: Shower seat;Other (comment) (Home O2)      Prior Function Level of Independence: Needs assistance   Gait / Transfers Assistance Needed: Household and community mobility without AD.  ADL's / Homemaking Assistance Needed: Patient reports requiring assist from sister with "all" ADLs        Hand Dominance        Extremity/Trunk Assessment   Upper Extremity Assessment Upper Extremity Assessment: Generalized weakness    Lower Extremity Assessment Lower Extremity Assessment: Generalized weakness       Communication   Communication: Tracheostomy;Passy-Muir valve;Other (comment) (Difficult to understand)  Cognition Arousal/Alertness: Awake/alert Behavior During Therapy: WFL for tasks assessed/performed Overall Cognitive Status: Impaired/Different from baseline Area of Impairment: Memory;Safety/judgement                     Memory: Decreased short-term memory   Safety/Judgement:  Decreased awareness of safety     General Comments: Patient A&Ox3 to person, place and situation. Patient is quick to anger. Low frustration tolerance.      General Comments General comments (skin integrity, edema, etc.): vss on 28% Fio2 on 10 L.  95-98% SpO2    Exercises     Assessment/Plan    PT Assessment Patient needs continued PT services  PT Problem List Decreased strength;Decreased activity tolerance;Decreased balance;Decreased mobility;Decreased knowledge of use of DME       PT Treatment Interventions DME instruction;Gait training;Functional mobility training;Therapeutic activities;Balance training;Patient/family education    PT Goals (Current goals can be found in the Care Plan section)  Acute Rehab PT Goals Patient Stated Goal: "To get up outta here" PT Goal Formulation: With patient Time For Goal Achievement: 06/10/21 Potential to Achieve Goals: Good    Frequency Min 3X/week   Barriers to discharge        Co-evaluation               AM-PAC PT "6 Clicks" Mobility  Outcome Measure Help needed turning from your back to your side while in a flat bed without using bedrails?: A Little Help needed moving from lying on your back to sitting on the side of a flat bed without using bedrails?: A Little Help needed moving to and from a bed to a chair (including a wheelchair)?: A Little Help needed standing up from a chair using your arms (e.g., wheelchair or bedside chair)?: A Little Help needed to walk in hospital room?: A Little Help needed climbing 3-5 steps with a railing? : A Lot 6 Click Score: 17    End of Session     Patient left: in chair;with call bell/phone within reach;with chair alarm set Nurse Communication: Mobility status PT Visit Diagnosis: Unsteadiness on feet (R26.81);Muscle weakness (generalized) (M62.81);Difficulty in walking, not elsewhere classified (R26.2)    Time: 8341-9622 PT Time Calculation (min) (ACUTE ONLY): 26 min   Charges:    PT Evaluation $PT Eval Moderate Complexity: 1 Mod PT Treatments $Gait Training: 8-22 mins        05/27/2021  Ginger Carne., PT Acute Rehabilitation Services [phone]  (pager) [phone]  (office)  Tessie Fass Taichi Repka 05/27/2021, 1:17 PM

## 2021-05-27 NOTE — Progress Notes (Signed)
Initial Nutrition Assessment  DOCUMENTATION CODES:   Severe malnutrition in context of chronic illness,Underweight  INTERVENTION:   If aggressive nutrition intervention is desired, recommend PEG placement given weight loss, underweight status, severe malnutrition present and poor swallowing. Notified MD of recommendation  Magic cup TID with meals, each supplement provides 290 kcal and 9 grams of protein  Ensure Enlive po BID, each supplement provides 350 kcal and 20 grams of protein. Needs to be thickened to HONEY consistency at this time  Add MVI with Minerals   NUTRITION DIAGNOSIS:   Severe Malnutrition related to chronic illness as evidenced by severe fat depletion,severe muscle depletion.   GOAL:   Patient will meet greater than or equal to 90% of their needs   MONITOR:   PO intake,Supplement acceptance,Labs,Weight trends  REASON FOR ASSESSMENT:   Consult Assessment of nutrition requirement/status  ASSESSMENT:   60 yo male admitted with trach obstruction. 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 Pt ate some most of the grits this AM. No recorded po intake   Pt reports he lives at home with his sister who helps prepare meals for him.  Pt reports he eats 2 meals per day at home and also drinks Boost/Ensure Pt reports he has lost 30 pounds. Unable to tell RD time frame. Pt is very underweight based on BMI with severe muscle wasting and subcutaneous fat loss on exam.   Noted pt has been followed by outpatient oncology RDs and previously had a feeding tube and receiving TF.   Labs: reviewed Meds: reviewed  NUTRITION - FOCUSED PHYSICAL EXAM:  Flowsheet Row Most Recent Value  Orbital Region Severe depletion  Upper Arm Region Severe depletion  Thoracic and Lumbar Region Severe depletion  Buccal Region Severe depletion  Temple Region  Severe depletion  Clavicle Bone Region Severe depletion  Clavicle and Acromion Bone Region Severe depletion  Scapular Bone Region Severe depletion  Dorsal Hand Moderate depletion  Patellar Region Severe depletion  Anterior Thigh Region Severe depletion  Posterior Calf Region Severe depletion       Diet Order:   Diet Order            DIET DYS 3 Room service appropriate? Yes; Fluid consistency: Honey Thick  Diet effective now                 EDUCATION NEEDS:   Education needs have been addressed  Skin:  Skin Assessment: Reviewed RN Assessment  Last BM:  5/25  Height:   Ht Readings from Last 1 Encounters:  05/25/21 5\' 6"  (1.676 m)    Weight:   Wt Readings from Last 1 Encounters:  05/24/21 40.8 kg    BMI:  Body mass index is 14.53 kg/m.  Estimated Nutritional Needs:   Kcal:  1650-1850 kcals  Protein:  80-90 g  Fluid:  >/= 1.7 L    Kerman Passey MS, RDN, LDN, CNSC Registered Dietitian III Clinical Nutrition RD Pager and On-Call Pager Number Located in [place]

## 2021-05-28 ENCOUNTER — Encounter (HOSPITAL_COMMUNITY): Payer: Self-pay | Admitting: Otolaryngology

## 2021-05-28 DIAGNOSIS — J9509 Other tracheostomy complication: Secondary | ICD-10-CM | POA: Diagnosis not present

## 2021-05-28 MED ORDER — ALBUTEROL SULFATE (2.5 MG/3ML) 0.083% IN NEBU
2.5000 mg | INHALATION_SOLUTION | Freq: Four times a day (QID) | RESPIRATORY_TRACT | Status: DC | PRN
  Administered 2021-06-06: 2.5 mg via RESPIRATORY_TRACT
  Filled 2021-05-28: qty 3

## 2021-05-28 MED ORDER — PANTOPRAZOLE SODIUM 40 MG PO TBEC
40.0000 mg | DELAYED_RELEASE_TABLET | Freq: Every day | ORAL | Status: DC
  Administered 2021-05-28 – 2021-06-13 (×17): 40 mg via ORAL
  Filled 2021-05-28 (×17): qty 1

## 2021-05-28 MED ORDER — IPRATROPIUM-ALBUTEROL 0.5-2.5 (3) MG/3ML IN SOLN
3.0000 mL | Freq: Two times a day (BID) | RESPIRATORY_TRACT | Status: DC
  Administered 2021-05-28 – 2021-06-02 (×10): 3 mL via RESPIRATORY_TRACT
  Filled 2021-05-28 (×11): qty 3

## 2021-05-28 MED ORDER — ONDANSETRON HCL 4 MG/2ML IJ SOLN
4.0000 mg | INTRAMUSCULAR | Status: DC | PRN
  Administered 2021-05-28: 4 mg via INTRAVENOUS
  Filled 2021-05-28: qty 2

## 2021-05-28 NOTE — Progress Notes (Signed)
PROGRESS NOTE    Perry Waller  WEX:[id number] DOB: 04/11/1961 DOA: 05/24/2021 PCP: Pcp, No  Outpatient Specialists:   Brief Narrative:  Patient is a 60 year old African-American male with past medical history significant for stage IV laryngeal cancer status postchemotherapy and radiation therapy, s/p tracheostomy in August 2020, COPD, chronic tobacco use, severe PEM and chronic anemia.  Patient was admitted from the ENTs office with concerns for tracheostomy obstruction and plans for tracheostomy revision procedure.  Patient has undergone revision of the tracheostomy.  ENT team is managing patient's tracheostomy tube postop.  Patient is medically stable for discharge, once cleared for discharge by the ENT.  Palliative care input is appreciated.  Plan is to discharge patient to skilled nursing facility.  Continue management for malnutrition.  Thick secretions have improved significantly.  Assessment & Plan:   Principal Problem:   Tracheostomy obstruction (HCC) Active Problems:   Tobacco abuse   Protein-calorie malnutrition, severe   Tracheal stenosis following tracheostomy (HCC)   Dehydration   Prerenal azotemia   COPD (chronic obstructive pulmonary disease) (HCC)   Chronic anemia   Tracheostomy obstruction: -Patient has undergone revision.   -Postop management as per ENT. -ENT will need to clear patient for discharge as well. -Patient is stable from general medical point.  Severe protein calorie malnutrition: -Likely multifactorial. -Dietary team input is highly appreciated.   -Low albumin may have prognostic significance. -Palliative care input is appreciated. -Patient also has history of advanced cancer. -Nutritional consistency as per speech therapy team.  COPD: -Stable. -Continue current management.  History of chronic tobacco abuse: -Counseled patient to quit cigarettes.    Chronic anemia: -Stable.  Hypomagnesemia: -Last magnesium was 1.6 (checked on  05/26/2021) -Repeat levels in the morning.  DVT prophylaxis: SCD. Code Status: DNR/DNI Family Communication:   Disposition Plan: Skilled nursing facility eventually  Consultants:   ENT  Palliative care team.  Procedures:   Revision of tracheostomy.  Antimicrobials:   None   Subjective: Patient looks much better today. No significant secretions reported.  Objective: Vitals:   05/28/21 0728 05/28/21 1134 05/28/21 1426 05/28/21 1510  BP:   133/84   Pulse: 74 73 71 72  Resp: 16 16 16 16   Temp:   98.2 F (36.8 C)   TempSrc:   Oral   SpO2: 95% 100% 100% 98%  Weight:      Height:        Intake/Output Summary (Last 24 hours) at 05/28/2021 1621 Last data filed at 05/28/2021 1200 Gross per 24 hour  Intake 6499.66 ml  Output 1125 ml  Net 5374.66 ml   Filed Weights   05/24/21 1700  Weight: 40.8 kg    Examination:  General exam: Patient is cachectic.  Patient is not in any distress.  Patient is awake and alert.  Patient has trach in place. Respiratory system: Clear to auscultation, decreased air entry. Cardiovascular system: S1 & S2  Gastrointestinal system: Abdomen is nondistended, soft and nontender. No organomegaly or masses felt. Normal bowel sounds heard. Central nervous system: Awake and alert.  Patient moves all extremities.   Extremities: No leg edema.  Data Reviewed: I have personally reviewed following labs and imaging studies  CBC: Recent Labs  Lab 05/24/21 1735 05/25/21 0340 05/26/21 0117  WBC 7.4 6.3 8.5  NEUTROABS 5.5  --  7.1  HGB 9.8* 8.9* 8.4*  HCT 32.2* 29.3* 27.3*  MCV 93.1 93.3 91.6  PLT 413* 442* 938   Basic Metabolic Panel: Recent Labs  Lab 05/24/21 1735 05/25/21  0340 05/26/21 0117  NA 136 135 136  K 4.5 4.2 4.1  CL 92* 96* 102  CO2 32 32 24  GLUCOSE 89 97 114*  BUN 19 12 8   CREATININE 0.51* 0.50* 0.53*  CALCIUM 9.1 8.7* 8.4*  MG 1.7 1.7 1.6*  PHOS 4.3 3.8 3.4   GFR: Estimated Creatinine Clearance: 57.4 mL/min (A)  (by C-G formula based on SCr of 0.53 mg/dL (L)). Liver Function Tests: Recent Labs  Lab 05/24/21 1735 05/25/21 0340 05/26/21 0117  AST 34 32  --   ALT 23 22  --   ALKPHOS 57 56  --   BILITOT 1.1 0.9  --   PROT 7.1 6.8  --   ALBUMIN 2.7* 2.4* 2.4*   No results for input(s): LIPASE, AMYLASE in the last 168 hours. No results for input(s): AMMONIA in the last 168 hours. Coagulation Profile: Recent Labs  Lab 05/25/21 0340  INR 1.1   Cardiac Enzymes: No results for input(s): CKTOTAL, CKMB, CKMBINDEX, TROPONINI in the last 168 hours. BNP (last 3 results) No results for input(s): PROBNP in the last 8760 hours. HbA1C: No results for input(s): HGBA1C in the last 72 hours. CBG: No results for input(s): GLUCAP in the last 168 hours. Lipid Profile: No results for input(s): CHOL, HDL, LDLCALC, TRIG, CHOLHDL, LDLDIRECT in the last 72 hours. Thyroid Function Tests: No results for input(s): TSH, T4TOTAL, FREET4, T3FREE, THYROIDAB in the last 72 hours. Anemia Panel: No results for input(s): VITAMINB12, FOLATE, FERRITIN, TIBC, IRON, RETICCTPCT in the last 72 hours. Urine analysis:    Component Value Date/Time   COLORURINE YELLOW 10/17/2011 2052   APPEARANCEUR CLEAR 10/17/2011 2052   LABSPEC 1.024 10/17/2011 2052   PHURINE 6.5 10/17/2011 2052   GLUCOSEU NEGATIVE 10/17/2011 2052   HGBUR TRACE (A) 10/17/2011 2052   BILIRUBINUR NEGATIVE 10/17/2011 2052   KETONESUR NEGATIVE 10/17/2011 2052   PROTEINUR NEGATIVE 10/17/2011 2052   UROBILINOGEN 0.2 10/17/2011 2052   NITRITE NEGATIVE 10/17/2011 2052   LEUKOCYTESUR NEGATIVE 10/17/2011 2052   Sepsis Labs: @LABRCNTIP (procalcitonin:4,lacticidven:4)  ) Recent Results (from the past 240 hour(s))  Resp Panel by RT-PCR (Flu A&B, Covid) Nasopharyngeal Swab     Status: None   Collection Time: 05/24/21  5:35 PM   Specimen: Nasopharyngeal Swab; Nasopharyngeal(NP) swabs in vial transport medium  Result Value Ref Range Status   SARS Coronavirus 2  by RT PCR NEGATIVE NEGATIVE Final    Comment: (NOTE) SARS-CoV-2 target nucleic acids are NOT DETECTED.  The SARS-CoV-2 RNA is generally detectable in upper respiratory specimens during the acute phase of infection. The lowest concentration of SARS-CoV-2 viral copies this assay can detect is 138 copies/mL. A negative result does not preclude SARS-Cov-2 infection and should not be used as the sole basis for treatment or other patient management decisions. A negative result may occur with  improper specimen collection/handling, submission of specimen other than nasopharyngeal swab, presence of viral mutation(s) within the areas targeted by this assay, and inadequate number of viral copies(<138 copies/mL). A negative result must be combined with clinical observations, patient history, and epidemiological information. The expected result is Negative.  Fact Sheet for Patients:  EntrepreneurPulse.com.au  Fact Sheet for Healthcare Providers:  IncredibleEmployment.be  This test is no t yet approved or cleared by the [place] FDA and  has been authorized for detection and/or diagnosis of SARS-CoV-2 by FDA under an Emergency Use Authorization (EUA). This EUA will remain  in effect (meaning this test can be used) for the duration of the  COVID-19 declaration under Section 564(b)(1) of the Act, 21 U.S.C.section 360bbb-3(b)(1), unless the authorization is terminated  or revoked sooner.       Influenza A by PCR NEGATIVE NEGATIVE Final   Influenza B by PCR NEGATIVE NEGATIVE Final    Comment: (NOTE) The Xpert Xpress SARS-CoV-2/FLU/RSV plus assay is intended as an aid in the diagnosis of influenza from Nasopharyngeal swab specimens and should not be used as a sole basis for treatment. Nasal washings and aspirates are unacceptable for Xpert Xpress SARS-CoV-2/FLU/RSV testing.  Fact Sheet for Patients: EntrepreneurPulse.com.au  Fact  Sheet for Healthcare Providers: IncredibleEmployment.be  This test is not yet approved or cleared by the [place] FDA and has been authorized for detection and/or diagnosis of SARS-CoV-2 by FDA under an Emergency Use Authorization (EUA). This EUA will remain in effect (meaning this test can be used) for the duration of the COVID-19 declaration under Section 564(b)(1) of the Act, 21 U.S.C. section 360bbb-3(b)(1), unless the authorization is terminated or revoked.  Performed at [facility], [place] [address] [place], [place] [address]          Radiology Studies: No results found.      Scheduled Meds: . dexamethasone  2 mg Oral BID WC   Followed by  . [START ON 06/01/2021] dexamethasone  2 mg Oral Q breakfast   Followed by  . [START ON 06/08/2021] dexamethasone  1 mg Oral Q breakfast  . feeding supplement  237 mL Oral BID BM  . ipratropium-albuterol  3 mL Nebulization BID  . multivitamin with minerals  1 tablet Oral Daily  . pantoprazole  40 mg Oral Daily   Continuous Infusions: . sodium chloride 100 mL/hr at 05/28/21 0219     LOS: 4 days    Time spent: 25 minutes.    Dana Allan, MD  [facility] Pager #: [phone] 7PM-7AM contact night coverage as above

## 2021-05-28 NOTE — NC FL2 (Signed)
[place] LEVEL OF CARE SCREENING TOOL     IDENTIFICATION  Patient Name: Perry Waller Birthdate: [birth year] Sex: male Admission Date (Current Location): 05/24/2021  [place] and [place] Number:  Kathleen Argue [phone] [place] and Address:  [facility], [place] [address] [place], [place] [address]      Provider Number: [id number]  Attending Physician Name and Address:  Bonnell Public, MD  Relative Name and Phone Number:  Elvera Lennox [phone]    Current Level of Care: Hospital Recommended Level of Care: [place] Prior Approval Number:    Date Approved/Denied:   PASRR Number: [id number]  Discharge Plan: SNF    Current Diagnoses: Patient Active Problem List   Diagnosis Date Noted  . Dehydration 05/25/2021  . Prerenal azotemia 05/25/2021  . COPD (chronic obstructive pulmonary disease) ([place])   . Chronic anemia   . Tracheostomy obstruction ([place]) 05/24/2021  . Abnormal CT of the chest 10/13/2020  . Tracheostomy care ([place]) 10/13/2020  . Tracheal stenosis following tracheostomy ([place])   . Centrilobular emphysema ([place]) 01/18/2020  . Palliative care encounter   . DNR (do not resuscitate)   . Goals of care, counseling/discussion   . 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 Protein-calorie malnutrition, severe 08/07/2019  . Tobacco abuse 08/05/2019  . Constipation 08/05/2019    Orientation RESPIRATION BLADDER Height & Weight     Self,Time,Situation,Place  Tracheostomy,O2 External catheter Weight: 90 lb (40.8 kg) Height:  5\' 6"  (167.6 cm)  BEHAVIORAL SYMPTOMS/MOOD NEUROLOGICAL BOWEL NUTRITION STATUS      Continent Diet (see discharge summary)  AMBULATORY STATUS COMMUNICATION OF NEEDS Skin   Limited Assist Verbally (difficult due to trach) Normal                       Personal Care Assistance Level of Assistance  Bathing,Feeding,Dressing Bathing Assistance: Limited  assistance Feeding assistance: Independent Dressing Assistance: Limited assistance     Functional Limitations Info  Sight,Hearing,Speech Sight Info: Adequate Hearing Info: Adequate Speech Info: Impaired (trach)    SPECIAL CARE FACTORS FREQUENCY  PT (By licensed PT),OT (By licensed OT)     PT Frequency: 5x week OT Frequency: 5x week            Contractures Contractures Info: Not present    Additional Factors Info  Code Status,Allergies Code Status Info: DNR Allergies Info: fish allergy           Current Medications (05/28/2021):  This is the current hospital active medication list Current Facility-Administered Medications  Medication Dose Route Frequency Provider Last Rate Last Admin  . 0.9 %  sodium chloride infusion   Intravenous Continuous Howerter, Justin B, DO 100 mL/hr at 05/28/21 0219 New Bag at 05/28/21 0219  . acetaminophen (TYLENOL) tablet 650 mg  650 mg Oral Q6H PRN Howerter, Justin B, DO       Or  . acetaminophen (TYLENOL) suppository 650 mg  650 mg Rectal Q6H PRN Howerter, Justin B, DO      . albuterol (PROVENTIL) (2.5 MG/3ML) 0.083% nebulizer solution 2.5 mg  2.5 mg Nebulization Q6H PRN Dana Allan I, MD      . dexamethasone (DECADRON) tablet 2 mg  2 mg Oral BID WC Howerter, Justin B, DO   2 mg at 05/28/21 8889   Followed by  . [START ON 06/01/2021] dexamethasone (DECADRON) tablet 2 mg  2 mg Oral Q breakfast Howerter, Justin B, DO       Followed by  . [  START ON 06/08/2021] dexamethasone (DECADRON) tablet 1 mg  1 mg Oral Q breakfast Howerter, Justin B, DO      . feeding supplement (ENSURE ENLIVE / ENSURE PLUS) liquid 237 mL  237 mL Oral BID BM Dana Allan I, MD   237 mL at 05/28/21 1139  . ipratropium-albuterol (DUONEB) 0.5-2.5 (3) MG/3ML nebulizer solution 3 mL  3 mL Nebulization BID Dana Allan I, MD      . multivitamin with minerals tablet 1 tablet  1 tablet Oral Daily Dana Allan I, MD   1 tablet at 05/28/21 1140  . nicotine  (NICODERM CQ - dosed in mg/24 hours) patch 14 mg  14 mg Transdermal Daily PRN Howerter, Justin B, DO   14 mg at 05/24/21 2134  . pantoprazole (PROTONIX) EC tablet 40 mg  40 mg Oral Daily Bonnell Public, MD         Discharge Medications: Please see discharge summary for a list of discharge medications.  Relevant Imaging Results:  Relevant Lab Results:   Additional Information SSN: [id number].  Vaccinated for covid.  No booster  Joanne Chars, LCSW

## 2021-05-28 NOTE — Plan of Care (Signed)
  Problem: Pain Managment: Goal: General experience of comfort will improve Outcome: Progressing   Problem: Safety: Goal: Ability to remain free from injury will improve Outcome: Progressing   

## 2021-05-28 NOTE — Plan of Care (Signed)
  Problem: Clinical Measurements: Goal: Respiratory complications will improve Outcome: Progressing   Problem: Nutrition: Goal: Adequate nutrition will be maintained Outcome: Progressing   Problem: Elimination: Goal: Will not experience complications related to urinary retention Outcome: Progressing   Problem: Activity: Goal: Risk for activity intolerance will decrease Note: Pt ambulated from bed to chair without any difficulty. Stand-by assist was appropriate

## 2021-05-28 NOTE — Progress Notes (Signed)
  Speech Language Pathology Treatment: Nada Boozer Speaking valve;Dysphagia  Patient Details Name: Perry Waller MRN: [id number] DOB: November 29, 1961 Today's Date: 05/28/2021 Time: 8144-8185 SLP Time Calculation (min) (ACUTE ONLY): 37 min  Assessment / Plan / Recommendation Clinical Impression  Pt reports he is wearing his [name] all waking hours with assistance from RN to place and remove. He exhibits no discomfort with valve in place. He was difficult to comprehend at first due to poor breath support and aphonia at the end of his phrases. When given feedback that he was difficult to comprehend pt immediate began chunking his words into shorter phrases with improved intelligibility and he carried this over throughout conversation with a few revision requests still needed. Pt benefits from seeing a speakers face as well to comprehend them as his hearing is impaired. Pt can continue [name] use; he will likely have improved vocal quality when trach is changed to cuffless.  Pt additionally tolerating honey thick liquids, but is frustrated by the lack of flavor in his foods. He says his appetite has improved and he is eating about 50% of meals, but not enjoying them because they do not have flavor. SLP upgraded solids to regular so he can order any food he likes, as solids were tolerated well during MBS. Assisted pt in ordering the next three meals to his liking, which includes lots of butter. Pt reports he feels d/c to SNF would be best.      HPI HPI: Perry Waller is a 60 y/o M with history of laryngeal cancer, status post chemo and XRT, status post trach in 2020 and PEG tube. Pt did complete SLP visits in 2021 during radiation but did not attend all sessions and eventually stopped attending though dysphagia was persistent. His last MBS on 01/18/20 recommended regular solids and thin liquids "Cued cough cleared trace aspirates and supraglottic swallow with extended breath hold effective to prevent aspiration." The study also  repoted narrow airway. Pt has had subsequent decannulation and removal of PEG tube but presented to radiation oncologist on 05/15/21 and then ENT with stridor, now s/p tracheostomy 05/25/21 and direct laryngoscopy and biopsy. Pt has a persistent tracheocutaneous fistula. During bronchoscopy, pt desaturated and there was difficulty placing a 6 cuffed shiley and concern for damage to the posterior tracheal Tow. A 4 -0 shiley was placed. during laryngoscopy pt was found to have "Extensive fibrinous debris and thick drainage coating and obstructing the supraglottis and glottis, which was easily suctioned. Mild edema and post-radiation changes of the true and false vocal folds and arytenoids. No mass lesion appreciated." SLP was ordered to evaluate swallowing, but ENT note specifically stated pt not be given Barium, only Gastrografin, likely due to concern for fistula.      SLP Plan  Continue with current plan of care       Recommendations  Diet recommendations: Regular;Honey-thick liquid Liquids provided via: Cup;Straw Medication Administration: Whole meds with puree Supervision: Patient able to self feed Compensations: Slow rate;Small sips/bites Postural Changes and/or Swallow Maneuvers: Seated upright 90 degrees                Plan: Continue with current plan of care       GO                Lisa Blakeman, Katherene Ponto 05/28/2021, 9:22 AM

## 2021-05-28 NOTE — Plan of Care (Signed)
  Problem: Nutrition: Goal: Adequate nutrition will be maintained Outcome: Progressing   Problem: Pain Managment: Goal: General experience of comfort will improve Outcome: Progressing   

## 2021-05-28 NOTE — TOC Progression Note (Signed)
Transition of Care [facility]) - Progression Note    Patient Details  Name: Perry Waller MRN: [id number] Date of Birth: November 03, 1961  [facility] of [facility]) CM/SW Contact  Joanne Chars, LCSW Phone Number: 05/28/2021, 2:37 PM  Clinical Narrative:   Pt agreeable to DC plan for SNF.  Choice document given.  Pt asking about [place] aide, CSW noticed prior note that pt was scheduled for medicaid eval for [facility] aide by [employer] on June 6, discussed with pt.  Pt hoping to be able to return to sister's home with more assistance, particularly in AM.  Pt asking CSW to contact [facility] to discuss eval if pt hospitalized or at [facility].    Expected Discharge Plan: Skilled Nursing Facility Barriers to Discharge: Continued Medical Work up,SNF Pending bed offer  Expected Discharge Plan and Services Expected Discharge Plan: [place] In-house Referral: Clinical Social Work Discharge Planning Services: CM Consult                                           Social Determinants of Health (SDOH) Interventions    Readmission Risk Interventions No flowsheet data found.

## 2021-05-29 DIAGNOSIS — J9509 Other tracheostomy complication: Secondary | ICD-10-CM | POA: Diagnosis not present

## 2021-05-29 LAB — CBC WITH DIFFERENTIAL/PLATELET
Abs Immature Granulocytes: 0.03 10*3/uL (ref 0.00–0.07)
Basophils Absolute: 0 10*3/uL (ref 0.0–0.1)
Basophils Relative: 0 %
Eosinophils Absolute: 0 10*3/uL (ref 0.0–0.5)
Eosinophils Relative: 0 %
HCT: 26.1 % — ABNORMAL LOW (ref 39.0–52.0)
Hemoglobin: 8.2 g/dL — ABNORMAL LOW (ref 13.0–17.0)
Immature Granulocytes: 1 %
Lymphocytes Relative: 14 %
Lymphs Abs: 0.7 10*3/uL (ref 0.7–4.0)
MCH: 28.7 pg (ref 26.0–34.0)
MCHC: 31.4 g/dL (ref 30.0–36.0)
MCV: 91.3 fL (ref 80.0–100.0)
Monocytes Absolute: 0.5 10*3/uL (ref 0.1–1.0)
Monocytes Relative: 10 %
Neutro Abs: 4 10*3/uL (ref 1.7–7.7)
Neutrophils Relative %: 75 %
Platelets: 394 10*3/uL (ref 150–400)
RBC: 2.86 MIL/uL — ABNORMAL LOW (ref 4.22–5.81)
RDW: 14.9 % (ref 11.5–15.5)
WBC: 5.3 10*3/uL (ref 4.0–10.5)
nRBC: 0 % (ref 0.0–0.2)

## 2021-05-29 LAB — MAGNESIUM: Magnesium: 1.4 mg/dL — ABNORMAL LOW (ref 1.7–2.4)

## 2021-05-29 LAB — RENAL FUNCTION PANEL
Albumin: 2 g/dL — ABNORMAL LOW (ref 3.5–5.0)
Anion gap: 9 (ref 5–15)
BUN: 5 mg/dL — ABNORMAL LOW (ref 6–20)
CO2: 30 mmol/L (ref 22–32)
Calcium: 8.3 mg/dL — ABNORMAL LOW (ref 8.9–10.3)
Chloride: 97 mmol/L — ABNORMAL LOW (ref 98–111)
Creatinine, Ser: 0.51 mg/dL — ABNORMAL LOW (ref 0.61–1.24)
GFR, Estimated: >60 mL/min (ref >60)
Glucose, Bld: 196 mg/dL — ABNORMAL HIGH (ref 70–99)
Phosphorus: 2.4 mg/dL — ABNORMAL LOW (ref 2.5–4.6)
Potassium: 3.4 mmol/L — ABNORMAL LOW (ref 3.5–5.1)
Sodium: 136 mmol/L (ref 135–145)

## 2021-05-29 MED ORDER — MOMETASONE FURO-FORMOTEROL FUM 100-5 MCG/ACT IN AERO
2.0000 | INHALATION_SPRAY | Freq: Two times a day (BID) | RESPIRATORY_TRACT | Status: DC
  Administered 2021-05-29 – 2021-06-12 (×28): 2 via RESPIRATORY_TRACT
  Filled 2021-05-29: qty 8.8

## 2021-05-29 MED ORDER — MAGNESIUM SULFATE 2 GM/50ML IV SOLN
2.0000 g | Freq: Once | INTRAVENOUS | Status: AC
  Administered 2021-05-29: 2 g via INTRAVENOUS
  Filled 2021-05-29: qty 50

## 2021-05-29 MED ORDER — POTASSIUM CHLORIDE CRYS ER 20 MEQ PO TBCR
40.0000 meq | EXTENDED_RELEASE_TABLET | Freq: Once | ORAL | Status: AC
  Administered 2021-05-29: 40 meq via ORAL
  Filled 2021-05-29: qty 2

## 2021-05-29 NOTE — Progress Notes (Signed)
Physical Therapy Treatment Patient Details Name: Perry Waller MRN: [id number] DOB: September 16, 1961 Today's Date: 05/29/2021    History of Present Illness 60 y.o. male presenting on 5/27 from ENT clinic with tracheostomy obstruction. Patient transferred to [facility] for tracheostomy revision. 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    PT Comments    Pt making good progress toward goals.  Emphasis on bed mobility, transfers from different surface heights and progression of gait speed and stability.    Follow Up Recommendations  SNF;Other (comment) (possible LTC)     Equipment Recommendations  None recommended by PT (TBA at venue)    Recommendations for Other Services       Precautions / Restrictions Precautions Precautions: Fall    Mobility  Bed Mobility Overal bed mobility: Needs Assistance Bed Mobility: Supine to Sit     Supine to sit: Supervision     General bed mobility comments: Supervision A for safety.    Transfers Overall transfer level: Needs assistance Equipment used: 1 person hand held assist Transfers: Sit to/from Stand Sit to Stand: Min guard         General transfer comment: Min guard for safety.  Ambulation/Gait Ambulation/Gait assistance: Min assist Gait Distance (Feet): 190 Feet Assistive device: IV Pole Gait Pattern/deviations: Step-through pattern;Decreased stride length Gait velocity: slower Gait velocity interpretation: <1.8 ft/sec, indicate of risk for recurrent falls General Gait Details: still mildly unsteady, but improved holding to the IV pole today.  short, slow steps, could benefit from an AD--cane vs RW, but is resistant.   Stairs             Wheelchair Mobility    Modified Rankin (Stroke Patients Only)       Balance Overall balance assessment: Needs assistance Sitting-balance support: Single extremity supported;No  upper extremity supported;Feet supported Sitting balance-Leahy Scale: Fair     Standing balance support: Single extremity supported;During functional activity Standing balance-Leahy Scale: Poor Standing balance comment: can maintain static stance without UE's, but needing AD or external support dynamically.                            Cognition Arousal/Alertness: Awake/alert Behavior During Therapy: WFL for tasks assessed/performed Overall Cognitive Status: Impaired/Different from baseline Area of Impairment: Memory;Safety/judgement                     Memory: Decreased short-term memory   Safety/Judgement: Decreased awareness of safety     General Comments: Patient A&Ox3 to person, place and situation. Patient is quick to anger. Low frustration tolerance.      Exercises      General Comments General comments (skin integrity, edema, etc.): vss on 35%, sats in the upper 90's, HR/EHR in the low 100's      Pertinent Vitals/Pain Pain Assessment: Faces Faces Pain Scale: Hurts a little bit Pain Location: Toes bilaterally Pain Descriptors / Indicators: Numbness;Tingling    Home Living Family/patient expects to be discharged to:: Skilled nursing facility Living Arrangements: Other relatives (Living with sister prior to this admission. Sister unable to continue caring for this patient with desire for LTC.)           Home Equipment: Shower seat;Other (comment) (Home O2)      Prior Function Level of Independence: Needs assistance  Gait / Transfers Assistance Needed: Household and community mobility without AD. ADL's / Homemaking Assistance Needed:  Patient reports requiring assist from sister with "all" ADLs     PT Goals (current goals can now be found in the care plan section) Acute Rehab PT Goals Patient Stated Goal: "To get up outta here" PT Goal Formulation: With patient Time For Goal Achievement: 06/10/21 Potential to Achieve Goals: Good Progress  towards PT goals: Progressing toward goals    Frequency    Min 3X/week      PT Plan Current plan remains appropriate    Co-evaluation              AM-PAC PT "6 Clicks" Mobility   Outcome Measure  Help needed turning from your back to your side while in a flat bed without using bedrails?: A Little Help needed moving from lying on your back to sitting on the side of a flat bed without using bedrails?: A Little Help needed moving to and from a bed to a chair (including a wheelchair)?: A Little Help needed standing up from a chair using your arms (e.g., wheelchair or bedside chair)?: A Little Help needed to walk in hospital room?: A Little Help needed climbing 3-5 steps with a railing? : A Lot 6 Click Score: 17    End of Session Equipment Utilized During Treatment: Oxygen Activity Tolerance: Patient tolerated treatment well Patient left: with call bell/phone within reach;in bed;with bed alarm set Nurse Communication: Mobility status PT Visit Diagnosis: Unsteadiness on feet (R26.81);Muscle weakness (generalized) (M62.81);Difficulty in walking, not elsewhere classified (R26.2)     Time: 8110-3159 PT Time Calculation (min) (ACUTE ONLY): 28 min  Charges:  $Gait Training: 8-22 mins $Therapeutic Activity: 8-22 mins                     05/29/2021  Ginger Carne., PT Acute Rehabilitation Services [phone]  (pager) [phone]  (office)   Tessie Fass Yohan Samons 05/29/2021, 5:32 PM

## 2021-05-29 NOTE — Progress Notes (Signed)
PROGRESS NOTE    Perry Waller  INO:[id number] DOB: 1961-07-28 DOA: 05/24/2021 PCP: Merryl Hacker, No     Brief Narrative:  Perry Waller is a 60 year old African-American male with past medical history significant for stage IV laryngeal cancer status postchemotherapy and radiation therapy, s/p tracheostomy in August 2020, COPD, chronic tobacco use, severe PEM and chronic anemia.  Patient was admitted from the ENTs office with concerns for tracheostomy obstruction and plans for tracheostomy revision procedure.  Patient has undergone revision of the tracheostomy on 5/28.  ENT team is managing patient's tracheostomy tube postop.  Patient is medically stable for discharge, once cleared for discharge by the ENT.  Palliative care input is appreciated.    New events last 24 hours / Subjective: Patient complaining of some chronic hand and feet pain that has been ongoing for couple of years.  States that he takes Aleve at home.  Also wondering about SNF placement.  Assessment & Plan:   Principal Problem:   Tracheostomy obstruction (HCC) Active Problems:   Tobacco abuse   Protein-calorie malnutrition, severe   Tracheal stenosis following tracheostomy (HCC)   Dehydration   Prerenal azotemia   COPD (chronic obstructive pulmonary disease) (HCC)   Chronic anemia    Tracheostomy obstruction -Status post tracheostomy revision by Dr. Fredric Dine ENT on 5/28 -ENT plans to do first trach change on postop day #5, 6/2, to cuffless trach  Severe protein calorie malnutrition -Appreciate dietitian, palliative care medicine  COPD -Stable  Tobacco abuse -Cessation counseling -Nicotine patch daily   Hypokalemia -Replace, trend  Hypomagnesia -Replace, trend   DVT prophylaxis:  SCDs Start: 05/24/21 2000  Code Status:     Code Status Orders  (From admission, onward)         Start     Ordered   05/24/21 2000  Do not attempt resuscitation (DNR)  Continuous       Question Answer Comment  In the event of  cardiac or respiratory ARREST Do not call a "code blue"   In the event of cardiac or respiratory ARREST Do not perform Intubation, CPR, defibrillation or ACLS   In the event of cardiac or respiratory ARREST Use medication by any route, position, wound care, and other measures to relive pain and suffering. May use oxygen, suction and manual treatment of airway obstruction as needed for comfort.      05/24/21 2000        Code Status History    Date Active Date Inactive Code Status Order ID Comments User Context   08/13/2019 0956 08/17/2019 1922 DNR [id number]  Lorenso Quarry Inpatient   08/06/2019 0133 08/13/2019 0956 Full Code [id number]  Ivor Costa, MD Inpatient   06/29/2019 0834 06/30/2019 1922 Full Code [id number]  Lars Mage, MD ED   Advance Care Planning Activity     Family Communication: none at bedside Disposition Plan:  Status is: Inpatient  Remains inpatient appropriate because:Inpatient level of care appropriate due to severity of illness   Dispo: The patient is from: Home              Anticipated d/c is to: SNF              Patient currently is not medically stable to d/c.   Difficult to place patient No      Consultants:   ENT  Palliative care medicine   Antimicrobials:  Anti-infectives (From admission, onward)   None        Objective: Vitals:   05/29/21 0555 05/29/21  0720 05/29/21 0826 05/29/21 1122  BP: (!) 144/96  130/85   Pulse: 74 88 76 81  Resp: 18 18  18   Temp:   97.7 F (36.5 C)   TempSrc:      SpO2: 95% 99% 97% 99%  Weight: 51.4 kg     Height:        Intake/Output Summary (Last 24 hours) at 05/29/2021 1158 Last data filed at 05/28/2021 1800 Gross per 24 hour  Intake 925.43 ml  Output 225 ml  Net 700.43 ml   Filed Weights   05/24/21 1700 05/29/21 0555  Weight: 40.8 kg 51.4 kg    Examination:  General exam: Appears calm and comfortable  Respiratory system: Clear to auscultation. Respiratory effort normal. No respiratory  distress.  Trach collar in place Cardiovascular system: S1 & S2 heard, RRR. No murmurs. No pedal edema. Gastrointestinal system: Abdomen is nondistended, soft and nontender. Normal bowel sounds heard. Central nervous system: Alert and oriented. No focal neurological deficits.  Extremities: Symmetric in appearance  Skin: No rashes, lesions or ulcers on exposed skin  Psychiatry: [id number]  Data Reviewed: I have personally reviewed following labs and imaging studies  CBC: Recent Labs  Lab 05/24/21 1735 05/25/21 0340 05/26/21 0117 05/29/21 0423  WBC 7.4 6.3 8.5 5.3  NEUTROABS 5.5  --  7.1 4.0  HGB 9.8* 8.9* 8.4* 8.2*  HCT 32.2* 29.3* 27.3* 26.1*  MCV 93.1 93.3 91.6 91.3  PLT 413* 442* 375 664   Basic Metabolic Panel: Recent Labs  Lab 05/24/21 1735 05/25/21 0340 05/26/21 0117 05/29/21 0423  NA 136 135 136 136  K 4.5 4.2 4.1 3.4*  CL 92* 96* 102 97*  CO2 32 32 24 30  GLUCOSE 89 97 114* 196*  BUN 19 12 8  5*  CREATININE 0.51* 0.50* 0.53* 0.51*  CALCIUM 9.1 8.7* 8.4* 8.3*  MG 1.7 1.7 1.6* 1.4*  PHOS 4.3 3.8 3.4 2.4*   GFR: Estimated Creatinine Clearance: 72.3 mL/min (A) (by C-G formula based on SCr of 0.51 mg/dL (L)). Liver Function Tests: Recent Labs  Lab 05/24/21 1735 05/25/21 0340 05/26/21 0117 05/29/21 0423  AST 34 32  --   --   ALT 23 22  --   --   ALKPHOS 57 56  --   --   BILITOT 1.1 0.9  --   --   PROT 7.1 6.8  --   --   ALBUMIN 2.7* 2.4* 2.4* 2.0*   No results for input(s): LIPASE, AMYLASE in the last 168 hours. No results for input(s): AMMONIA in the last 168 hours. Coagulation Profile: Recent Labs  Lab 05/25/21 0340  INR 1.1   Cardiac Enzymes: No results for input(s): CKTOTAL, CKMB, CKMBINDEX, TROPONINI in the last 168 hours. BNP (last 3 results) No results for input(s): PROBNP in the last 8760 hours. HbA1C: No results for input(s): HGBA1C in the last 72 hours. CBG: No results for input(s): GLUCAP in the last 168 hours. Lipid Profile: No  results for input(s): CHOL, HDL, LDLCALC, TRIG, CHOLHDL, LDLDIRECT in the last 72 hours. Thyroid Function Tests: No results for input(s): TSH, T4TOTAL, FREET4, T3FREE, THYROIDAB in the last 72 hours. Anemia Panel: No results for input(s): VITAMINB12, FOLATE, FERRITIN, TIBC, IRON, RETICCTPCT in the last 72 hours. Sepsis Labs: No results for input(s): PROCALCITON, LATICACIDVEN in the last 168 hours.  Recent Results (from the past 240 hour(s))  Resp Panel by RT-PCR (Flu A&B, Covid) Nasopharyngeal Swab     Status: None   Collection Time: 05/24/21  5:35 PM   Specimen: Nasopharyngeal Swab; Nasopharyngeal(NP) swabs in vial transport medium  Result Value Ref Range Status   SARS Coronavirus 2 by RT PCR NEGATIVE NEGATIVE Final    Comment: (NOTE) SARS-CoV-2 target nucleic acids are NOT DETECTED.  The SARS-CoV-2 RNA is generally detectable in upper respiratory specimens during the acute phase of infection. The lowest concentration of SARS-CoV-2 viral copies this assay can detect is 138 copies/mL. A negative result does not preclude SARS-Cov-2 infection and should not be used as the sole basis for treatment or other patient management decisions. A negative result may occur with  improper specimen collection/handling, submission of specimen other than nasopharyngeal swab, presence of viral mutation(s) within the areas targeted by this assay, and inadequate number of viral copies(<138 copies/mL). A negative result must be combined with clinical observations, patient history, and epidemiological information. The expected result is Negative.  Fact Sheet for Patients:  EntrepreneurPulse.com.au  Fact Sheet for Healthcare Providers:  IncredibleEmployment.be  This test is no t yet approved or cleared by the [place] FDA and  has been authorized for detection and/or diagnosis of SARS-CoV-2 by FDA under an Emergency Use Authorization (EUA). This EUA will remain   in effect (meaning this test can be used) for the duration of the COVID-19 declaration under Section 564(b)(1) of the Act, 21 U.S.C.section 360bbb-3(b)(1), unless the authorization is terminated  or revoked sooner.       Influenza A by PCR NEGATIVE NEGATIVE Final   Influenza B by PCR NEGATIVE NEGATIVE Final    Comment: (NOTE) The Xpert Xpress SARS-CoV-2/FLU/RSV plus assay is intended as an aid in the diagnosis of influenza from Nasopharyngeal swab specimens and should not be used as a sole basis for treatment. Nasal washings and aspirates are unacceptable for Xpert Xpress SARS-CoV-2/FLU/RSV testing.  Fact Sheet for Patients: EntrepreneurPulse.com.au  Fact Sheet for Healthcare Providers: IncredibleEmployment.be  This test is not yet approved or cleared by the [place] FDA and has been authorized for detection and/or diagnosis of SARS-CoV-2 by FDA under an Emergency Use Authorization (EUA). This EUA will remain in effect (meaning this test can be used) for the duration of the COVID-19 declaration under Section 564(b)(1) of the Act, 21 U.S.C. section 360bbb-3(b)(1), unless the authorization is terminated or revoked.  Performed at [facility], [place] [address], [place], [place] [address]       Radiology Studies: No results found.    Scheduled Meds: . dexamethasone  2 mg Oral BID WC   Followed by  . [START ON 06/01/2021] dexamethasone  2 mg Oral Q breakfast   Followed by  . [START ON 06/08/2021] dexamethasone  1 mg Oral Q breakfast  . feeding supplement  237 mL Oral BID BM  . ipratropium-albuterol  3 mL Nebulization BID  . multivitamin with minerals  1 tablet Oral Daily  . pantoprazole  40 mg Oral Daily   Continuous Infusions:   LOS: 5 days      Time spent: 25 minutes   Dessa Phi, DO [facility] 05/29/2021, 11:58 AM   Available via Epic secure chat 7am-7pm After these hours, please refer to coverage  provider listed on [email or web address]

## 2021-05-29 NOTE — Plan of Care (Signed)

## 2021-05-29 NOTE — TOC Progression Note (Addendum)
Transition of Care [facility]) - Progression Note    Patient Details  Name: Perry Waller MRN: [id number] Date of Birth: [birth year]  Transition of Care [facility] CM/SW Contact  Joanne Chars, LCSW Phone Number: 05/29/2021, 2:33 PM  Clinical Narrative:   CSW spoke with [facility] regarding scheduled [facility] [place] aide eval on June 6.  Per [facility] rep, the evaluation can only be done in the home setting, they cannot do it in the hospital or at SNF, so will need to be rescheduled when pt is leaving SNF.   CSW made the following calls seeking SNF placement: [facility]: no trachs [facility]: no trachs [facility]: no trachs [facility]: will review [facility]: would need pt to be 30 days out from trach revision and then would review again [facility]: will review [facility]: no trachs   Currently the following facilities have declined in the hub: [facility], [facility], [facility], [facility], [facility], [facility]    Expected Discharge Plan: [place] Barriers to Discharge: Continued Medical Work up,SNF Pending bed offer  Expected Discharge Plan and Services Expected Discharge Plan: [place] In-house Referral: Clinical Social Work Discharge Planning Services: CM Consult                                           Social Determinants of Health (SDOH) Interventions    Readmission Risk Interventions No flowsheet data found.

## 2021-05-30 DIAGNOSIS — J9509 Other tracheostomy complication: Secondary | ICD-10-CM | POA: Diagnosis not present

## 2021-05-30 LAB — BASIC METABOLIC PANEL
Anion gap: 9 (ref 5–15)
BUN: <5 mg/dL — ABNORMAL LOW (ref 6–20)
CO2: 29 mmol/L (ref 22–32)
Calcium: 8.3 mg/dL — ABNORMAL LOW (ref 8.9–10.3)
Chloride: 95 mmol/L — ABNORMAL LOW (ref 98–111)
Creatinine, Ser: 0.48 mg/dL — ABNORMAL LOW (ref 0.61–1.24)
GFR, Estimated: >60 mL/min (ref >60)
Glucose, Bld: 191 mg/dL — ABNORMAL HIGH (ref 70–99)
Potassium: 3.4 mmol/L — ABNORMAL LOW (ref 3.5–5.1)
Sodium: 133 mmol/L — ABNORMAL LOW (ref 135–145)

## 2021-05-30 LAB — MAGNESIUM: Magnesium: 1.6 mg/dL — ABNORMAL LOW (ref 1.7–2.4)

## 2021-05-30 MED ORDER — MAGNESIUM SULFATE 2 GM/50ML IV SOLN
2.0000 g | Freq: Once | INTRAVENOUS | Status: AC
  Administered 2021-05-30: 2 g via INTRAVENOUS
  Filled 2021-05-30: qty 50

## 2021-05-30 MED ORDER — POTASSIUM CHLORIDE CRYS ER 20 MEQ PO TBCR
40.0000 meq | EXTENDED_RELEASE_TABLET | Freq: Once | ORAL | Status: AC
  Administered 2021-05-30: 40 meq via ORAL
  Filled 2021-05-30: qty 2

## 2021-05-30 NOTE — Progress Notes (Signed)
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  Patient was admitted from the ENTs office with concerns for tracheostomy obstruction and plans for tracheostomy revision procedure.  Patient has undergone revision of the tracheostomy on 5/28.  ENT team is managing patient's tracheostomy tube postop.  Patient is medically stable for discharge, once cleared for discharge by the ENT.  Palliative care input is appreciated.    New events last 24 hours / Subjective: Patient asking about if he is allowed to leave, told the patient patient that he may walk up and down the hall but not leave the unit.  He also asked about getting a bath.  Assessment & Plan:   Principal Problem:   Tracheostomy obstruction (HCC) Active Problems:   Tobacco abuse   Protein-calorie malnutrition, severe   Tracheal stenosis following tracheostomy (HCC)   Dehydration   Prerenal azotemia   COPD (chronic obstructive pulmonary disease) (HCC)   Chronic anemia    Tracheostomy obstruction -Status post tracheostomy revision by Dr. Fredric Dine ENT on 5/28 -ENT plans to do first trach change on postop day #5, 6/2, to cuffless trach  Severe protein calorie malnutrition -Appreciate dietitian, palliative care medicine  COPD -Stable  Tobacco abuse -Cessation counseling -Nicotine patch daily   Hypokalemia -Replace, trend  Hypomagnesia -Replace, trend   DVT prophylaxis:  SCDs Start: 05/24/21 2000  Code Status:     Code Status Orders  (From admission, onward)         Start     Ordered   05/24/21 2000  Do not attempt resuscitation (DNR)  Continuous       Question Answer Comment  In the event of  cardiac or respiratory ARREST Do not call a "code blue"   In the event of cardiac or respiratory ARREST Do not perform Intubation, CPR, defibrillation or ACLS   In the event of cardiac or respiratory ARREST Use medication by any route, position, wound care, and other measures to relive pain and suffering. May use oxygen, suction and manual treatment of airway obstruction as needed for comfort.      05/24/21 2000        Code Status History    Date Active Date Inactive Code Status Order ID Comments User Context   08/13/2019 0956 08/17/2019 1922 DNR [id number]  Lorenso Quarry Inpatient   08/06/2019 0133 08/13/2019 0956 Full Code [id number]  Ivor Costa, MD Inpatient   06/29/2019 0834 06/30/2019 1922 Full Code [id number]  Lars Mage, MD ED   Advance Care Planning Activity     Family Communication: none at bedside Disposition Plan:  Status is: Inpatient  Remains inpatient appropriate because:Inpatient level of care appropriate due to severity of illness   Dispo: The patient is from: Home              Anticipated d/c is to: SNF              Patient currently is not medically stable to d/c.  Awaiting final ENT assessment, SNF placement pending.   Difficult to place patient Yes      Consultants:   ENT  Palliative care medicine   Antimicrobials:  Anti-infectives (From admission, onward)   None  Objective: Vitals:   05/30/21 0043 05/30/21 0215 05/30/21 0411 05/30/21 0800  BP:  (!) 141/82    Pulse: 80 78 83 86  Resp: 18 18 18 18   Temp:  98.3 F (36.8 C)    TempSrc:  Oral    SpO2: 97% 99% 100% 97%  Weight:      Height:        Intake/Output Summary (Last 24 hours) at 05/30/2021 1119 Last data filed at 05/30/2021 0647 Gross per 24 hour  Intake --  Output 4300 ml  Net -4300 ml   Filed Weights   05/24/21 1700 05/29/21 0555  Weight: 40.8 kg 51.4 kg   Examination: General exam: Appears calm and comfortable  Respiratory system: Clear to auscultation.  Respiratory effort normal.  Trach collar in place Cardiovascular system: S1 & S2 heard, RRR. No pedal edema. Gastrointestinal system: Abdomen is nondistended, soft and nontender. Normal bowel sounds heard. Central nervous system: Alert and oriented. Non focal exam. Speech clear  Extremities: Symmetric in appearance bilaterally  Skin: No rashes, lesions or ulcers on exposed skin  Psychiatry: Judgement and insight appear stable. Mood & affect appropriate.    Data Reviewed: I have personally reviewed following labs and imaging studies  CBC: Recent Labs  Lab 05/24/21 1735 05/25/21 0340 05/26/21 0117 05/29/21 0423  WBC 7.4 6.3 8.5 5.3  NEUTROABS 5.5  --  7.1 4.0  HGB 9.8* 8.9* 8.4* 8.2*  HCT 32.2* 29.3* 27.3* 26.1*  MCV 93.1 93.3 91.6 91.3  PLT 413* 442* 375 147   Basic Metabolic Panel: Recent Labs  Lab 05/24/21 1735 05/25/21 0340 05/26/21 0117 05/29/21 0423 05/30/21 0203  NA 136 135 136 136 133*  K 4.5 4.2 4.1 3.4* 3.4*  CL 92* 96* 102 97* 95*  CO2 32 32 24 30 29   GLUCOSE 89 97 114* 196* 191*  BUN 19 12 8  5* <5*  CREATININE 0.51* 0.50* 0.53* 0.51* 0.48*  CALCIUM 9.1 8.7* 8.4* 8.3* 8.3*  MG 1.7 1.7 1.6* 1.4* 1.6*  PHOS 4.3 3.8 3.4 2.4*  --    GFR: Estimated Creatinine Clearance: 72.3 mL/min (A) (by C-G formula based on SCr of 0.48 mg/dL (L)). Liver Function Tests: Recent Labs  Lab 05/24/21 1735 05/25/21 0340 05/26/21 0117 05/29/21 0423  AST 34 32  --   --   ALT 23 22  --   --   ALKPHOS 57 56  --   --   BILITOT 1.1 0.9  --   --   PROT 7.1 6.8  --   --   ALBUMIN 2.7* 2.4* 2.4* 2.0*   No results for input(s): LIPASE, AMYLASE in the last 168 hours. No results for input(s): AMMONIA in the last 168 hours. Coagulation Profile: Recent Labs  Lab 05/25/21 0340  INR 1.1   Cardiac Enzymes: No results for input(s): CKTOTAL, CKMB, CKMBINDEX, TROPONINI in the last 168 hours. BNP (last 3 results) No results for input(s): PROBNP in the last 8760 hours. HbA1C: No  results for input(s): HGBA1C in the last 72 hours. CBG: No results for input(s): GLUCAP in the last 168 hours. Lipid Profile: No results for input(s): CHOL, HDL, LDLCALC, TRIG, CHOLHDL, LDLDIRECT in the last 72 hours. Thyroid Function Tests: No results for input(s): TSH, T4TOTAL, FREET4, T3FREE, THYROIDAB in the last 72 hours. Anemia Panel: No results for input(s): VITAMINB12, FOLATE, FERRITIN, TIBC, IRON, RETICCTPCT in the last 72 hours. Sepsis Labs: No results for input(s): PROCALCITON, LATICACIDVEN in the last 168 hours.  Recent Results (from the  past 240 hour(s))  Resp Panel by RT-PCR (Flu A&B, Covid) Nasopharyngeal Swab     Status: None   Collection Time: 05/24/21  5:35 PM   Specimen: Nasopharyngeal Swab; Nasopharyngeal(NP) swabs in vial transport medium  Result Value Ref Range Status   SARS Coronavirus 2 by RT PCR NEGATIVE NEGATIVE Final    Comment: (NOTE) SARS-CoV-2 target nucleic acids are NOT DETECTED.  The SARS-CoV-2 RNA is generally detectable in upper respiratory specimens during the acute phase of infection. The lowest concentration of SARS-CoV-2 viral copies this assay can detect is 138 copies/mL. A negative result does not preclude SARS-Cov-2 infection and should not be used as the sole basis for treatment or other patient management decisions. A negative result may occur with  improper specimen collection/handling, submission of specimen other than nasopharyngeal swab, presence of viral mutation(s) within the areas targeted by this assay, and inadequate number of viral copies(<138 copies/mL). A negative result must be combined with clinical observations, patient history, and epidemiological information. The expected result is Negative.  Fact Sheet for Patients:  EntrepreneurPulse.com.au  Fact Sheet for Healthcare Providers:  IncredibleEmployment.be  This test is no t yet approved or cleared by the [place] FDA and  has  been authorized for detection and/or diagnosis of SARS-CoV-2 by FDA under an Emergency Use Authorization (EUA). This EUA will remain  in effect (meaning this test can be used) for the duration of the COVID-19 declaration under Section 564(b)(1) of the Act, 21 U.S.C.section 360bbb-3(b)(1), unless the authorization is terminated  or revoked sooner.       Influenza A by PCR NEGATIVE NEGATIVE Final   Influenza B by PCR NEGATIVE NEGATIVE Final    Comment: (NOTE) The Xpert Xpress SARS-CoV-2/FLU/RSV plus assay is intended as an aid in the diagnosis of influenza from Nasopharyngeal swab specimens and should not be used as a sole basis for treatment. Nasal washings and aspirates are unacceptable for Xpert Xpress SARS-CoV-2/FLU/RSV testing.  Fact Sheet for Patients: EntrepreneurPulse.com.au  Fact Sheet for Healthcare Providers: IncredibleEmployment.be  This test is not yet approved or cleared by the [place] FDA and has been authorized for detection and/or diagnosis of SARS-CoV-2 by FDA under an Emergency Use Authorization (EUA). This EUA will remain in effect (meaning this test can be used) for the duration of the COVID-19 declaration under Section 564(b)(1) of the Act, 21 U.S.C. section 360bbb-3(b)(1), unless the authorization is terminated or revoked.  Performed at [facility], [place] [address] [place], [place] [address]       Radiology Studies: No results found.    Scheduled Meds: . dexamethasone  2 mg Oral BID WC   Followed by  . [START ON 06/01/2021] dexamethasone  2 mg Oral Q breakfast   Followed by  . [START ON 06/08/2021] dexamethasone  1 mg Oral Q breakfast  . feeding supplement  237 mL Oral BID BM  . ipratropium-albuterol  3 mL Nebulization BID  . mometasone-formoterol  2 puff Inhalation BID  . multivitamin with minerals  1 tablet Oral Daily  . pantoprazole  40 mg Oral Daily   Continuous Infusions:   LOS: 6 days       Time spent: 20 minutes   Dessa Phi, DO [facility] 05/30/2021, 11:19 AM   Available via Epic secure chat 7am-7pm After these hours, please refer to coverage provider listed on [email or web address]

## 2021-05-30 NOTE — Progress Notes (Signed)
OTO HNS PROGRESS NOTE  Patient seen and examined at bedside. Stable on trach collar, tolerating oral diet.   Trach changed to Shiley 4 uncuffed tracheostomy without difficulty. Small amount of circumferential crusting gently cleansed. Tracheostomy site widely patent, appears healthy, no skin breakdown noted.  Appropriate position of new trach confirmed with bedside tracheoscopy. Trach re-secured with Posey trach ties, PMV replaced, and patient placed back on trach collar.   A/P Perry Waller is a 60 y/o M with history of laryngeal cancer, status post chemo and XRT, status post trach in 2020 with subsequent decannulation presenting with stridor, now POD #5 s/p tracheostomy and direct laryngoscopy and biopsy.  -Trach changed to Shiley size 4 cuffless without difficulty  -Continue routine trach care -Please keep obturator taped to [facility] -Operative biopsy still pending -Continue diet as per SLP recs -OK to discharge to appropriate care facility from ENT perspective, social work following -Outpatient follow up with Dr. Constance Holster in 6 months, can follow up with trach clinic for trach needs once discharged from SNF  Thank you for allowing me to participate in the care of this patient. Please do not hesitate to contact me with any questions or concerns.   Jason Coop, [place] [facility] Cell: [phone]

## 2021-05-30 NOTE — Progress Notes (Signed)
Occupational Therapy Treatment Patient Details Name: Perry Waller MRN: [id number] DOB: 12-28-1961 Today's Date: 05/30/2021    History of present illness 60 y.o. male presenting on 5/27 from ENT clinic with tracheostomy obstruction. Patient transferred to [facility] for tracheostomy revision. 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   OT comments  Patient with slow progress toward goals. Progressed from bed to chair this date with increased coaxing/encouragement and Min guard to Min A without AD. Patient continues to be limited by decreased dynamic standing balance, generalized weakness, and decreased knowledge of current deficits. OT will continue to follow acutely.    Follow Up Recommendations  SNF;Supervision/Assistance - 24 hour    Equipment Recommendations  Other (comment) (Defer to next level of care)    Recommendations for Other Services      Precautions / Restrictions Precautions Precautions: Fall Restrictions Weight Bearing Restrictions: No       Mobility Bed Mobility Overal bed mobility: Needs Assistance Bed Mobility: Supine to Sit     Supine to sit: Supervision     General bed mobility comments: Supervision A for safety.    Transfers Overall transfer level: Needs assistance Equipment used: 1 person hand held assist Transfers: Sit to/from Stand Sit to Stand: Min guard         General transfer comment: Min guard for safety.    Balance Overall balance assessment: Needs assistance Sitting-balance support: Single extremity supported;No upper extremity supported;Feet supported Sitting balance-Leahy Scale: Fair     Standing balance support: Single extremity supported;During functional activity Standing balance-Leahy Scale: Poor Standing balance comment: can maintain static stance without UE's, but needing AD or external support dynamically.                            ADL either performed or assessed with clinical judgement   ADL Overall ADL's : Needs assistance/impaired     Grooming: Minimal assistance;Wash/dry hands;Wash/dry face;Standing Grooming Details (indicate cue type and reason): Min A to complete grooming tasks in standing 2/2 balance deficits and decreased safety awareness.         Upper Body Dressing : Set up;Sitting Upper Body Dressing Details (indicate cue type and reason): Set-up assist to don anterior hospital gown.     Toilet Transfer: Surveyor, minerals Details (indicate cue type and reason): Min A with transfer to recliner without AD.           General ADL Comments: Patient making progress toward goals. Requries increased coaxing/encouragement.     Vision       Perception     Praxis      Cognition Arousal/Alertness: Awake/alert Behavior During Therapy: WFL for tasks assessed/performed Overall Cognitive Status: Impaired/Different from baseline Area of Impairment: Memory;Safety/judgement                     Memory: Decreased short-term memory   Safety/Judgement: Decreased awareness of safety     General Comments: Patient A&Ox3 to person, place and situation. Patient is quick to anger. Low frustration tolerance.        Exercises     Shoulder Instructions       General Comments VSS on 25% via trach collar    Pertinent Vitals/ Pain       Pain Assessment: Faces Faces Pain Scale: Hurts a little bit Pain Location: Toes bilaterally Pain Descriptors / Indicators: Numbness;Tingling Pain  Intervention(s): Monitored during session  Home Living                                          Prior Functioning/Environment              Frequency  Min 2X/week        Progress Toward Goals  OT Goals(current goals can now be found in the care plan section)     Acute Rehab OT Goals Patient Stated Goal: "To get up outta here" OT  Goal Formulation: With patient Time For Goal Achievement: 06/10/21 Potential to Achieve Goals: Good ADL Goals Pt Will Perform Grooming: with supervision;standing Pt Will Perform Upper Body Dressing: sitting;with set-up Pt Will Perform Lower Body Dressing: with supervision;sit to/from stand Pt Will Transfer to Toilet: with supervision;ambulating Pt Will Perform Toileting - Clothing Manipulation and hygiene: with supervision;sit to/from stand Pt/caregiver will Perform Home Exercise Program: Both right and left upper extremity;With written HEP provided  Plan Discharge plan remains appropriate;Frequency remains appropriate    Co-evaluation                 AM-PAC OT "6 Clicks" Daily Activity     Outcome Measure   Help from another person eating meals?: A Little Help from another person taking care of personal grooming?: A Little Help from another person toileting, which includes using toliet, bedpan, or urinal?: A Little Help from another person bathing (including washing, rinsing, drying)?: A Little Help from another person to put on and taking off regular upper body clothing?: A Little Help from another person to put on and taking off regular lower body clothing?: A Little 6 Click Score: 18    End of Session Equipment Utilized During Treatment: Gait belt;Oxygen  OT Visit Diagnosis: Unsteadiness on feet (R26.81);Muscle weakness (generalized) (M62.81)   Activity Tolerance Patient tolerated treatment well   Patient Left in chair;with call bell/phone within reach;with chair alarm set   Nurse Communication Mobility status        Time: 6812-7517 OT Time Calculation (min): 41 min  Charges: OT General Charges $OT Visit: 1 Visit OT Treatments $Self Care/Home Management : 8-22 mins $Therapeutic Activity: 23-37 mins  Perry Waller H. OTR/L Supplemental OT, Department of rehab services [phone]   Baylin Gamblin R H. 05/30/2021, 1:08 PM

## 2021-05-30 NOTE — Progress Notes (Signed)
RT assisted Dr. Fredric Dine, ENT MD with bedside trach change. Pt #4 cuffed trach removed and a #4 Cuffless trach placed. Placement verified with scope by Dr. Fredric Dine. No breakdown noted to pts stoma or neck.  Pt tolerated procedure well. RT will continue to monitor and be available.

## 2021-05-30 NOTE — Progress Notes (Signed)
RT in to do routine trach check. Upon arrival to room pt upset. RT attempted to help pt in which he got upset and told this RT to get out of his room. Charge RN notified and pts RN to come assess. Pt in no distress at this time. RT will continue to monitor and be available as needed.

## 2021-05-31 DIAGNOSIS — J9509 Other tracheostomy complication: Secondary | ICD-10-CM | POA: Diagnosis not present

## 2021-05-31 LAB — BASIC METABOLIC PANEL
Anion gap: 8 (ref 5–15)
BUN: 11 mg/dL (ref 6–20)
CO2: 29 mmol/L (ref 22–32)
Calcium: 8.6 mg/dL — ABNORMAL LOW (ref 8.9–10.3)
Chloride: 98 mmol/L (ref 98–111)
Creatinine, Ser: 0.52 mg/dL — ABNORMAL LOW (ref 0.61–1.24)
GFR, Estimated: >60 mL/min (ref >60)
Glucose, Bld: 142 mg/dL — ABNORMAL HIGH (ref 70–99)
Potassium: 4.1 mmol/L (ref 3.5–5.1)
Sodium: 135 mmol/L (ref 135–145)

## 2021-05-31 LAB — MAGNESIUM: Magnesium: 1.6 mg/dL — ABNORMAL LOW (ref 1.7–2.4)

## 2021-05-31 LAB — SURGICAL PATHOLOGY

## 2021-05-31 MED ORDER — MAGNESIUM SULFATE 2 GM/50ML IV SOLN
2.0000 g | Freq: Once | INTRAVENOUS | Status: AC
  Administered 2021-05-31: 2 g via INTRAVENOUS
  Filled 2021-05-31: qty 50

## 2021-05-31 NOTE — Progress Notes (Signed)
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  Patient was admitted from the ENTs office with concerns for tracheostomy obstruction and plans for tracheostomy revision procedure.  Patient has undergone revision of the tracheostomy on 5/28.  ENT team is managing patient's tracheostomy tube postop.  Patient is medically stable for discharge, awaiting SNF placement.   New events last 24 hours / Subjective: Patient without any physical complaints today.  Ate breakfast, had his trach changed yesterday by ENT without any issues.  Assessment & Plan:   Principal Problem:   Tracheostomy obstruction (HCC) Active Problems:   Tobacco abuse   Protein-calorie malnutrition, severe   Tracheal stenosis following tracheostomy (HCC)   Dehydration   Prerenal azotemia   COPD (chronic obstructive pulmonary disease) (HCC)   Chronic anemia    Tracheostomy obstruction -Status post tracheostomy revision by Dr. Fredric Dine ENT on 5/28 -Trach changed to [facility] size 4 cuffless without difficulty on 6/2 -Follow up with Dr. Constance Holster in 6 months, follow up with trach clinic after SNF discharge   Severe protein calorie malnutrition -Appreciate dietitian, palliative care medicine  COPD -Stable  Tobacco abuse -Cessation counseling -Nicotine patch daily   Hypomagnesia -Replace, trend   DVT prophylaxis:  SCDs Start: 05/24/21 2000  Code Status:     Code Status Orders  (From admission, onward)         Start     Ordered   05/24/21 2000  Do not attempt resuscitation (DNR)  Continuous       Question Answer Comment  In the event of cardiac or respiratory ARREST Do not call a "code blue"    In the event of cardiac or respiratory ARREST Do not perform Intubation, CPR, defibrillation or ACLS   In the event of cardiac or respiratory ARREST Use medication by any route, position, wound care, and other measures to relive pain and suffering. May use oxygen, suction and manual treatment of airway obstruction as needed for comfort.      05/24/21 2000        Code Status History    Date Active Date Inactive Code Status Order ID Comments User Context   08/13/2019 0956 08/17/2019 1922 DNR [id number]  Lorenso Quarry Inpatient   08/06/2019 0133 08/13/2019 0956 Full Code [id number]  Ivor Costa, MD Inpatient   06/29/2019 0834 06/30/2019 1922 Full Code [id number]  Lars Mage, MD ED   Advance Care Planning Activity     Family Communication: none at bedside Disposition Plan:  Status is: Inpatient  Remains inpatient appropriate because:Inpatient level of care appropriate due to severity of illness   Dispo: The patient is from: Home              Anticipated d/c is to: SNF              Patient currently is medically stable to d/c.  Awaiting SNF placement.   Difficult to place patient Yes      Consultants:   ENT  Palliative care medicine   Antimicrobials:  Anti-infectives (From admission, onward)   None       Objective: Vitals:   05/30/21 2358 05/31/21 0355 05/31/21 0639 05/31/21 0727  BP:   (!) 163/94  Pulse:   98 97  Resp:   20 18  Temp:      TempSrc:      SpO2: 100% 96% 98% 97%  Weight:      Height:        Intake/Output Summary (Last 24 hours) at 05/31/2021 1203 Last data filed at 05/31/2021 0640 Gross per 24 hour  Intake --  Output 1950 ml  Net -1950 ml   Filed Weights   05/24/21 1700 05/29/21 0555  Weight: 40.8 kg 51.4 kg   Examination: General exam: Appears calm and comfortable  Respiratory system: Clear to auscultation. Respiratory effort normal.  Trach collar in place. Cardiovascular system: S1 & S2 heard, RRR. No pedal  edema. Gastrointestinal system: Abdomen is nondistended, soft and nontender. Normal bowel sounds heard. Central nervous system: Alert and oriented. Non focal exam. Speech clear  Extremities: Symmetric in appearance bilaterally  Skin: No rashes, lesions or ulcers on exposed skin  Psychiatry: Judgement and insight appear stable. Mood & affect appropriate.     Data Reviewed: I have personally reviewed following labs and imaging studies  CBC: Recent Labs  Lab 05/24/21 1735 05/25/21 0340 05/26/21 0117 05/29/21 0423  WBC 7.4 6.3 8.5 5.3  NEUTROABS 5.5  --  7.1 4.0  HGB 9.8* 8.9* 8.4* 8.2*  HCT 32.2* 29.3* 27.3* 26.1*  MCV 93.1 93.3 91.6 91.3  PLT 413* 442* 375 735   Basic Metabolic Panel: Recent Labs  Lab 05/24/21 1735 05/25/21 0340 05/26/21 0117 05/29/21 0423 05/30/21 0203 05/31/21 0217  NA 136 135 136 136 133* 135  K 4.5 4.2 4.1 3.4* 3.4* 4.1  CL 92* 96* 102 97* 95* 98  CO2 32 32 24 30 29 29   GLUCOSE 89 97 114* 196* 191* 142*  BUN 19 12 8  5* <5* 11  CREATININE 0.51* 0.50* 0.53* 0.51* 0.48* 0.52*  CALCIUM 9.1 8.7* 8.4* 8.3* 8.3* 8.6*  MG 1.7 1.7 1.6* 1.4* 1.6* 1.6*  PHOS 4.3 3.8 3.4 2.4*  --   --    GFR: Estimated Creatinine Clearance: 72.3 mL/min (A) (by C-G formula based on SCr of 0.52 mg/dL (L)). Liver Function Tests: Recent Labs  Lab 05/24/21 1735 05/25/21 0340 05/26/21 0117 05/29/21 0423  AST 34 32  --   --   ALT 23 22  --   --   ALKPHOS 57 56  --   --   BILITOT 1.1 0.9  --   --   PROT 7.1 6.8  --   --   ALBUMIN 2.7* 2.4* 2.4* 2.0*   No results for input(s): LIPASE, AMYLASE in the last 168 hours. No results for input(s): AMMONIA in the last 168 hours. Coagulation Profile: Recent Labs  Lab 05/25/21 0340  INR 1.1   Cardiac Enzymes: No results for input(s): CKTOTAL, CKMB, CKMBINDEX, TROPONINI in the last 168 hours. BNP (last 3 results) No results for input(s): PROBNP in the last 8760 hours. HbA1C: No results for input(s): HGBA1C in the last 72  hours. CBG: No results for input(s): GLUCAP in the last 168 hours. Lipid Profile: No results for input(s): CHOL, HDL, LDLCALC, TRIG, CHOLHDL, LDLDIRECT in the last 72 hours. Thyroid Function Tests: No results for input(s): TSH, T4TOTAL, FREET4, T3FREE, THYROIDAB in the last 72 hours. Anemia Panel: No results for input(s): VITAMINB12, FOLATE, FERRITIN, TIBC, IRON, RETICCTPCT in the last 72 hours. Sepsis Labs: No results for input(s): PROCALCITON, LATICACIDVEN in the last 168 hours.  Recent Results (from the past 240 hour(s))  Resp Panel by RT-PCR (  Flu A&B, Covid) Nasopharyngeal Swab     Status: None   Collection Time: 05/24/21  5:35 PM   Specimen: Nasopharyngeal Swab; Nasopharyngeal(NP) swabs in vial transport medium  Result Value Ref Range Status   SARS Coronavirus 2 by RT PCR NEGATIVE NEGATIVE Final    Comment: (NOTE) SARS-CoV-2 target nucleic acids are NOT DETECTED.  The SARS-CoV-2 RNA is generally detectable in upper respiratory specimens during the acute phase of infection. The lowest concentration of SARS-CoV-2 viral copies this assay can detect is 138 copies/mL. A negative result does not preclude SARS-Cov-2 infection and should not be used as the sole basis for treatment or other patient management decisions. A negative result may occur with  improper specimen collection/handling, submission of specimen other than nasopharyngeal swab, presence of viral mutation(s) within the areas targeted by this assay, and inadequate number of viral copies(<138 copies/mL). A negative result must be combined with clinical observations, patient history, and epidemiological information. The expected result is Negative.  Fact Sheet for Patients:  EntrepreneurPulse.com.au  Fact Sheet for Healthcare Providers:  IncredibleEmployment.be  This test is no t yet approved or cleared by the [place] FDA and  has been authorized for detection and/or  diagnosis of SARS-CoV-2 by FDA under an Emergency Use Authorization (EUA). This EUA will remain  in effect (meaning this test can be used) for the duration of the COVID-19 declaration under Section 564(b)(1) of the Act, 21 U.S.C.section 360bbb-3(b)(1), unless the authorization is terminated  or revoked sooner.       Influenza A by PCR NEGATIVE NEGATIVE Final   Influenza B by PCR NEGATIVE NEGATIVE Final    Comment: (NOTE) The Xpert Xpress SARS-CoV-2/FLU/RSV plus assay is intended as an aid in the diagnosis of influenza from Nasopharyngeal swab specimens and should not be used as a sole basis for treatment. Nasal washings and aspirates are unacceptable for Xpert Xpress SARS-CoV-2/FLU/RSV testing.  Fact Sheet for Patients: EntrepreneurPulse.com.au  Fact Sheet for Healthcare Providers: IncredibleEmployment.be  This test is not yet approved or cleared by the [place] FDA and has been authorized for detection and/or diagnosis of SARS-CoV-2 by FDA under an Emergency Use Authorization (EUA). This EUA will remain in effect (meaning this test can be used) for the duration of the COVID-19 declaration under Section 564(b)(1) of the Act, 21 U.S.C. section 360bbb-3(b)(1), unless the authorization is terminated or revoked.  Performed at [facility], [place] [address] [place], [place] [address]       Radiology Studies: No results found.    Scheduled Meds: . dexamethasone  2 mg Oral BID WC   Followed by  . [START ON 06/01/2021] dexamethasone  2 mg Oral Q breakfast   Followed by  . [START ON 06/08/2021] dexamethasone  1 mg Oral Q breakfast  . feeding supplement  237 mL Oral BID BM  . ipratropium-albuterol  3 mL Nebulization BID  . mometasone-formoterol  2 puff Inhalation BID  . multivitamin with minerals  1 tablet Oral Daily  . pantoprazole  40 mg Oral Daily   Continuous Infusions:   LOS: 7 days      Time spent: 20 minutes    Dessa Phi, DO [facility] 05/31/2021, 12:03 PM   Available via Epic secure chat 7am-7pm After these hours, please refer to coverage provider listed on [email or web address]

## 2021-05-31 NOTE — TOC Progression Note (Signed)
Transition of Care [facility]) - Progression Note    Patient Details  Name: Perry Waller MRN: [id number] Date of Birth: April 08, 1961  [facility] CM/SW Contact  Joanne Chars, LCSW Phone Number: 05/31/2021, 3:07 PM  Clinical Narrative:     Following are updates in search for SNF: [facility] has declined. [facility] cannot take trach patients, currently not taking any new admits. CSW LM with [facility], don't think they take medicaid but trying to confirm.   Selinda Michaels was reviewing yesterday but is out of office today.     Expected Discharge Plan: Skilled Nursing Facility Barriers to Discharge: Continued Medical Work up,SNF Pending bed offer  Expected Discharge Plan and Services Expected Discharge Plan: [place] In-house Referral: Clinical Social Work Discharge Planning Services: CM Consult                                           Social Determinants of Health (SDOH) Interventions    Readmission Risk Interventions No flowsheet data found.

## 2021-05-31 NOTE — Progress Notes (Signed)
Physical Therapy Treatment Patient Details Name: Perry Waller MRN: [id number] DOB: Jun 23, 1961 Today's Date: 05/31/2021    History of Present Illness 60 y.o. male presenting on 5/27 from ENT clinic with tracheostomy obstruction. Patient transferred to [facility] for tracheostomy revision. 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    PT Comments    Pt progressing with mobility and did use a rollator for ambulation which improved gait. Will work toward pt using a rollator consistently and encourage him to consider using after DC. Pt able to amb on RA with SpO2 96%.    Follow Up Recommendations  SNF;Other (comment) (vs LTC)     Equipment Recommendations  Other (comment) (rollator)    Recommendations for Other Services       Precautions / Restrictions Precautions Precautions: Fall    Mobility  Bed Mobility Overal bed mobility: Modified Independent Bed Mobility: Supine to Sit     Supine to sit: Modified independent (Device/Increase time)          Transfers Overall transfer level: Needs assistance Equipment used: 4-wheeled walker Transfers: Sit to/from Stand Sit to Stand: Min guard         General transfer comment: Min guard for safety.  Ambulation/Gait Ambulation/Gait assistance: Min guard Gait Distance (Feet): 200 Feet Assistive device: 4-wheeled walker Gait Pattern/deviations: Step-through pattern;Decreased stride length Gait velocity: decr Gait velocity interpretation: 1.31 - 2.62 ft/sec, indicative of limited community ambulator General Gait Details: Assist for safety and lines. No loss of balance using rollator   Stairs             Wheelchair Mobility    Modified Rankin (Stroke Patients Only)       Balance Overall balance assessment: Needs assistance Sitting-balance support: No upper extremity supported;Feet supported Sitting balance-Leahy  Scale: Fair     Standing balance support: During functional activity;No upper extremity supported Standing balance-Leahy Scale: Fair                              Cognition Arousal/Alertness: Awake/alert Behavior During Therapy: WFL for tasks assessed/performed Overall Cognitive Status: Impaired/Different from baseline Area of Impairment: Memory;Safety/judgement                     Memory: Decreased short-term memory   Safety/Judgement: Decreased awareness of safety     General Comments: .      Exercises      General Comments General comments (skin integrity, edema, etc.): VSS on RA with activity. SpO2 96%.      Pertinent Vitals/Pain Pain Assessment: Faces Faces Pain Scale: No hurt    Home Living                      Prior Function            PT Goals (current goals can now be found in the care plan section) Progress towards PT goals: Progressing toward goals;Goals met and updated - see care plan    Frequency    Min 3X/week      PT Plan Current plan remains appropriate    Co-evaluation              AM-PAC PT "6 Clicks" Mobility   Outcome Measure  Help needed turning from your back to your side while in a flat bed without using bedrails?: None Help needed moving from lying  on your back to sitting on the side of a flat bed without using bedrails?: None Help needed moving to and from a bed to a chair (including a wheelchair)?: A Little Help needed standing up from a chair using your arms (e.g., wheelchair or bedside chair)?: A Little Help needed to walk in hospital room?: A Little Help needed climbing 3-5 steps with a railing? : A Little 6 Click Score: 20    End of Session   Activity Tolerance: Patient tolerated treatment well Patient left: with call bell/phone within reach;in chair;with chair alarm set Nurse Communication: Mobility status PT Visit Diagnosis: Unsteadiness on feet (R26.81);Muscle weakness  (generalized) (M62.81);Difficulty in walking, not elsewhere classified (R26.2)     Time: 1600-1620 PT Time Calculation (min) (ACUTE ONLY): 20 min  Charges:  $Gait Training: 8-22 mins                     Cyrus Pager [phone] Office [place] 05/31/2021, 5:43 PM

## 2021-05-31 NOTE — Plan of Care (Signed)

## 2021-06-01 DIAGNOSIS — J9509 Other tracheostomy complication: Secondary | ICD-10-CM | POA: Diagnosis not present

## 2021-06-01 LAB — BASIC METABOLIC PANEL
Anion gap: 13 (ref 5–15)
BUN: 13 mg/dL (ref 6–20)
CO2: 30 mmol/L (ref 22–32)
Calcium: 9 mg/dL (ref 8.9–10.3)
Chloride: 93 mmol/L — ABNORMAL LOW (ref 98–111)
Creatinine, Ser: 0.52 mg/dL — ABNORMAL LOW (ref 0.61–1.24)
GFR, Estimated: >60 mL/min (ref >60)
Glucose, Bld: 127 mg/dL — ABNORMAL HIGH (ref 70–99)
Potassium: 4.2 mmol/L (ref 3.5–5.1)
Sodium: 136 mmol/L (ref 135–145)

## 2021-06-01 LAB — METHYLMALONIC ACID, SERUM: Methylmalonic Acid, Quantitative: 117 nmol/L (ref 0–378)

## 2021-06-01 LAB — MAGNESIUM: Magnesium: 1.6 mg/dL — ABNORMAL LOW (ref 1.7–2.4)

## 2021-06-01 MED ORDER — MAGNESIUM SULFATE 4 GM/100ML IV SOLN
4.0000 g | Freq: Once | INTRAVENOUS | Status: AC
  Administered 2021-06-01: 4 g via INTRAVENOUS
  Filled 2021-06-01: qty 100

## 2021-06-01 NOTE — Progress Notes (Signed)
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  Patient was admitted from the ENTs office with concerns for tracheostomy obstruction and plans for tracheostomy revision procedure.  Patient has undergone revision of the tracheostomy on 5/28.  ENT team is managing patient's tracheostomy tube postop.  Patient is medically stable for discharge, awaiting SNF placement.   New events last 24 hours / Subjective: Patient without any new complaints or symptoms today.  Assessment & Plan:   Principal Problem:   Tracheostomy obstruction (HCC) Active Problems:   Tobacco abuse   Protein-calorie malnutrition, severe   Tracheal stenosis following tracheostomy (HCC)   Dehydration   Prerenal azotemia   COPD (chronic obstructive pulmonary disease) (HCC)   Chronic anemia    Tracheostomy obstruction -Status post tracheostomy revision by Dr. Fredric Dine ENT on 5/28 -Trach changed to [facility] size 4 cuffless without difficulty on 6/2 -Follow up with Dr. Constance Holster in 6 months, follow up with trach clinic after SNF discharge   Severe protein calorie malnutrition -Appreciate dietitian, palliative care medicine  COPD -Stable  Tobacco abuse -Cessation counseling -Nicotine patch daily   Hypomagnesia -Replace, trend   DVT prophylaxis:  SCDs Start: 05/24/21 2000  Code Status:     Code Status Orders  (From admission, onward)         Start     Ordered   05/24/21 2000  Do not attempt resuscitation (DNR)  Continuous       Question Answer Comment  In the event of cardiac or respiratory ARREST Do not call a "code blue"   In the event of cardiac or respiratory ARREST Do not perform  Intubation, CPR, defibrillation or ACLS   In the event of cardiac or respiratory ARREST Use medication by any route, position, wound care, and other measures to relive pain and suffering. May use oxygen, suction and manual treatment of airway obstruction as needed for comfort.      05/24/21 2000        Code Status History    Date Active Date Inactive Code Status Order ID Comments User Context   08/13/2019 0956 08/17/2019 1922 DNR [id number]  Lorenso Quarry Inpatient   08/06/2019 0133 08/13/2019 0956 Full Code [id number]  Ivor Costa, MD Inpatient   06/29/2019 0834 06/30/2019 1922 Full Code [id number]  Lars Mage, MD ED   Advance Care Planning Activity     Family Communication: none at bedside Disposition Plan:  Status is: Inpatient  Remains inpatient appropriate because:Inpatient level of care appropriate due to severity of illness   Dispo: The patient is from: Home              Anticipated d/c is to: SNF              Patient currently is medically stable to d/c.  Awaiting SNF placement.   Difficult to place patient Yes      Consultants:   ENT  Palliative care medicine   Antimicrobials:  Anti-infectives (From admission, onward)   None       Objective: Vitals:   05/31/21 2157 06/01/21 0113 06/01/21 0512 06/01/21 0751  BP: (!) 142/89  (!) (P) 149/86   Pulse: 90 86 88   Resp: 18  18 18   Temp: 98.5 F (36.9 C)  (P) 98.4 F (36.9 C)   TempSrc: Oral     SpO2: 97% 99% 98% 96%  Weight:      Height:        Intake/Output Summary (Last 24 hours) at 06/01/2021 0956 Last data filed at 06/01/2021 0035 Gross per 24 hour  Intake --  Output 800 ml  Net -800 ml   Filed Weights   05/24/21 1700 05/29/21 0555  Weight: 40.8 kg 51.4 kg   Examination: General exam: Appears calm and comfortable  Respiratory system: Clear to auscultation. Respiratory effort normal.  Trach collar in place Cardiovascular system: S1 & S2 heard, RRR. No pedal edema. Gastrointestinal  system: Abdomen is nondistended, soft and nontender. Normal bowel sounds heard. Central nervous system: Alert and oriented. Non focal exam. Speech clear  Extremities: Symmetric in appearance bilaterally  Skin: No rashes, lesions or ulcers on exposed skin  Psychiatry: Judgement and insight appear stable. Mood & affect appropriate.   Data Reviewed: I have personally reviewed following labs and imaging studies  CBC: Recent Labs  Lab 05/26/21 0117 05/29/21 0423  WBC 8.5 5.3  NEUTROABS 7.1 4.0  HGB 8.4* 8.2*  HCT 27.3* 26.1*  MCV 91.6 91.3  PLT 375 494   Basic Metabolic Panel: Recent Labs  Lab 05/26/21 0117 05/29/21 0423 05/30/21 0203 05/31/21 0217 06/01/21 0211  NA 136 136 133* 135 136  K 4.1 3.4* 3.4* 4.1 4.2  CL 102 97* 95* 98 93*  CO2 24 30 29 29 30   GLUCOSE 114* 196* 191* 142* 127*  BUN 8 5* <5* 11 13  CREATININE 0.53* 0.51* 0.48* 0.52* 0.52*  CALCIUM 8.4* 8.3* 8.3* 8.6* 9.0  MG 1.6* 1.4* 1.6* 1.6* 1.6*  PHOS 3.4 2.4*  --   --   --    GFR: Estimated Creatinine Clearance: 72.3 mL/min (A) (by C-G formula based on SCr of 0.52 mg/dL (L)). Liver Function Tests: Recent Labs  Lab 05/26/21 0117 05/29/21 0423  ALBUMIN 2.4* 2.0*   No results for input(s): LIPASE, AMYLASE in the last 168 hours. No results for input(s): AMMONIA in the last 168 hours. Coagulation Profile: No results for input(s): INR, PROTIME in the last 168 hours. Cardiac Enzymes: No results for input(s): CKTOTAL, CKMB, CKMBINDEX, TROPONINI in the last 168 hours. BNP (last 3 results) No results for input(s): PROBNP in the last 8760 hours. HbA1C: No results for input(s): HGBA1C in the last 72 hours. CBG: No results for input(s): GLUCAP in the last 168 hours. Lipid Profile: No results for input(s): CHOL, HDL, LDLCALC, TRIG, CHOLHDL, LDLDIRECT in the last 72 hours. Thyroid Function Tests: No results for input(s): TSH, T4TOTAL, FREET4, T3FREE, THYROIDAB in the last 72 hours. Anemia Panel: No results  for input(s): VITAMINB12, FOLATE, FERRITIN, TIBC, IRON, RETICCTPCT in the last 72 hours. Sepsis Labs: No results for input(s): PROCALCITON, LATICACIDVEN in the last 168 hours.  Recent Results (from the past 240 hour(s))  Resp Panel by RT-PCR (Flu A&B, Covid) Nasopharyngeal Swab     Status: None   Collection Time: 05/24/21  5:35 PM   Specimen: Nasopharyngeal Swab; Nasopharyngeal(NP) swabs in vial transport medium  Result Value Ref Range Status   SARS Coronavirus 2 by RT PCR NEGATIVE NEGATIVE Final    Comment: (NOTE) SARS-CoV-2 target nucleic acids are NOT DETECTED.  The SARS-CoV-2 RNA is generally detectable in upper respiratory specimens during the acute phase of infection. The lowest concentration of SARS-CoV-2 viral copies this assay can detect  is 138 copies/mL. A negative result does not preclude SARS-Cov-2 infection and should not be used as the sole basis for treatment or other patient management decisions. A negative result may occur with  improper specimen collection/handling, submission of specimen other than nasopharyngeal swab, presence of viral mutation(s) within the areas targeted by this assay, and inadequate number of viral copies(<138 copies/mL). A negative result must be combined with clinical observations, patient history, and epidemiological information. The expected result is Negative.  Fact Sheet for Patients:  EntrepreneurPulse.com.au  Fact Sheet for Healthcare Providers:  IncredibleEmployment.be  This test is no t yet approved or cleared by the [place] FDA and  has been authorized for detection and/or diagnosis of SARS-CoV-2 by FDA under an Emergency Use Authorization (EUA). This EUA will remain  in effect (meaning this test can be used) for the duration of the COVID-19 declaration under Section 564(b)(1) of the Act, 21 U.S.C.section 360bbb-3(b)(1), unless the authorization is terminated  or revoked sooner.        Influenza A by PCR NEGATIVE NEGATIVE Final   Influenza B by PCR NEGATIVE NEGATIVE Final    Comment: (NOTE) The Xpert Xpress SARS-CoV-2/FLU/RSV plus assay is intended as an aid in the diagnosis of influenza from Nasopharyngeal swab specimens and should not be used as a sole basis for treatment. Nasal washings and aspirates are unacceptable for Xpert Xpress SARS-CoV-2/FLU/RSV testing.  Fact Sheet for Patients: EntrepreneurPulse.com.au  Fact Sheet for Healthcare Providers: IncredibleEmployment.be  This test is not yet approved or cleared by the [place] FDA and has been authorized for detection and/or diagnosis of SARS-CoV-2 by FDA under an Emergency Use Authorization (EUA). This EUA will remain in effect (meaning this test can be used) for the duration of the COVID-19 declaration under Section 564(b)(1) of the Act, 21 U.S.C. section 360bbb-3(b)(1), unless the authorization is terminated or revoked.  Performed at [facility], [place] [address], [place], [place] [address]       Radiology Studies: No results found.    Scheduled Meds: . dexamethasone  2 mg Oral Q breakfast   Followed by  . [START ON 06/08/2021] dexamethasone  1 mg Oral Q breakfast  . feeding supplement  237 mL Oral BID BM  . ipratropium-albuterol  3 mL Nebulization BID  . mometasone-formoterol  2 puff Inhalation BID  . multivitamin with minerals  1 tablet Oral Daily  . pantoprazole  40 mg Oral Daily   Continuous Infusions: . magnesium sulfate bolus IVPB       LOS: 8 days      Time spent: 15 minutes   Dessa Phi, DO [facility] 06/01/2021, 9:56 AM   Available via Epic secure chat 7am-7pm After these hours, please refer to coverage provider listed on [email or web address]

## 2021-06-02 DIAGNOSIS — J9509 Other tracheostomy complication: Secondary | ICD-10-CM | POA: Diagnosis not present

## 2021-06-02 LAB — BASIC METABOLIC PANEL
Anion gap: 9 (ref 5–15)
BUN: 11 mg/dL (ref 6–20)
CO2: 30 mmol/L (ref 22–32)
Calcium: 8.7 mg/dL — ABNORMAL LOW (ref 8.9–10.3)
Chloride: 96 mmol/L — ABNORMAL LOW (ref 98–111)
Creatinine, Ser: 0.54 mg/dL — ABNORMAL LOW (ref 0.61–1.24)
GFR, Estimated: >60 mL/min (ref >60)
Glucose, Bld: 127 mg/dL — ABNORMAL HIGH (ref 70–99)
Potassium: 3.8 mmol/L (ref 3.5–5.1)
Sodium: 135 mmol/L (ref 135–145)

## 2021-06-02 LAB — MAGNESIUM: Magnesium: 1.6 mg/dL — ABNORMAL LOW (ref 1.7–2.4)

## 2021-06-02 MED ORDER — MAGNESIUM SULFATE 4 GM/100ML IV SOLN
4.0000 g | Freq: Once | INTRAVENOUS | Status: AC
  Administered 2021-06-02: 4 g via INTRAVENOUS
  Filled 2021-06-02: qty 100

## 2021-06-02 NOTE — Progress Notes (Signed)
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  Patient was admitted from the ENTs office with concerns for tracheostomy obstruction and plans for tracheostomy revision procedure.  Patient has undergone revision of the tracheostomy on 5/28.  ENT team is managing patient's tracheostomy tube postop.  Patient is medically stable for discharge, awaiting SNF placement.   New events last 24 hours / Subjective: Patient's main complaint this morning is regarding his breakfast tray.  No physical issues voiced.  Assessment & Plan:   Principal Problem:   Tracheostomy obstruction (HCC) Active Problems:   Tobacco abuse   Protein-calorie malnutrition, severe   Tracheal stenosis following tracheostomy (HCC)   Dehydration   Prerenal azotemia   COPD (chronic obstructive pulmonary disease) (HCC)   Chronic anemia    Tracheostomy obstruction -Status post tracheostomy revision by Dr. Fredric Dine ENT on 5/28 -Trach changed to [facility] size 4 cuffless without difficulty on 6/2 -Follow up with Dr. Constance Holster in 6 months, follow up with trach clinic after SNF discharge   Severe protein calorie malnutrition -Appreciate dietitian, palliative care medicine  COPD -Stable  Tobacco abuse -Cessation counseling -Nicotine patch daily   Hypomagnesia -Replace, trend   DVT prophylaxis:  SCDs Start: 05/24/21 2000  Code Status:     Code Status Orders  (From admission, onward)         Start     Ordered   05/24/21 2000  Do not attempt resuscitation (DNR)  Continuous       Question Answer Comment  In the event of cardiac or respiratory ARREST Do not call a "code blue"   In the event of cardiac  or respiratory ARREST Do not perform Intubation, CPR, defibrillation or ACLS   In the event of cardiac or respiratory ARREST Use medication by any route, position, wound care, and other measures to relive pain and suffering. May use oxygen, suction and manual treatment of airway obstruction as needed for comfort.      05/24/21 2000        Code Status History    Date Active Date Inactive Code Status Order ID Comments User Context   08/13/2019 0956 08/17/2019 1922 DNR [id number]  Lorenso Quarry Inpatient   08/06/2019 0133 08/13/2019 0956 Full Code [id number]  Ivor Costa, MD Inpatient   06/29/2019 0834 06/30/2019 1922 Full Code [id number]  Lars Mage, MD ED   Advance Care Planning Activity     Family Communication: none at bedside Disposition Plan:  Status is: Inpatient  Remains inpatient appropriate because:Inpatient level of care appropriate due to severity of illness   Dispo: The patient is from: Home              Anticipated d/c is to: SNF              Patient currently is medically stable to d/c.  Awaiting SNF placement.   Difficult to place patient Yes      Consultants:   ENT  Palliative care medicine   Antimicrobials:  Anti-infectives (From admission, onward)   None       Objective: Vitals:   06/01/21 2310 06/02/21 0420 06/02/21 0650 06/02/21 0817  BP:   134/84   Pulse: 86 70 100  99  Resp: 18 18  (!) 22  Temp:   98.5 F (36.9 C)   TempSrc:      SpO2: 96% 95% 100% 97%  Weight:      Height:        Intake/Output Summary (Last 24 hours) at 06/02/2021 0928 Last data filed at 06/02/2021 0700 Gross per 24 hour  Intake --  Output 2450 ml  Net -2450 ml   Filed Weights   05/24/21 1700 05/29/21 0555  Weight: 40.8 kg 51.4 kg   Examination: General exam: Appears calm and comfortable  Respiratory system: Clear to auscultation. Respiratory effort normal.  Trach collar in place Cardiovascular system: S1 & S2 heard, RRR. No pedal edema. Gastrointestinal  system: Abdomen is nondistended, soft and nontender. Normal bowel sounds heard. Central nervous system: Alert and oriented. Non focal exam. Speech clear  Extremities: Symmetric in appearance bilaterally  Skin: No rashes, lesions or ulcers on exposed skin  Psychiatry: Judgement and insight appear stable. Mood & affect appropriate.    Data Reviewed: I have personally reviewed following labs and imaging studies  CBC: Recent Labs  Lab 05/29/21 0423  WBC 5.3  NEUTROABS 4.0  HGB 8.2*  HCT 26.1*  MCV 91.3  PLT 416   Basic Metabolic Panel: Recent Labs  Lab 05/29/21 0423 05/30/21 0203 05/31/21 0217 06/01/21 0211 06/02/21 0220  NA 136 133* 135 136 135  K 3.4* 3.4* 4.1 4.2 3.8  CL 97* 95* 98 93* 96*  CO2 30 29 29 30 30   GLUCOSE 196* 191* 142* 127* 127*  BUN 5* <5* 11 13 11   CREATININE 0.51* 0.48* 0.52* 0.52* 0.54*  CALCIUM 8.3* 8.3* 8.6* 9.0 8.7*  MG 1.4* 1.6* 1.6* 1.6* 1.6*  PHOS 2.4*  --   --   --   --    GFR: Estimated Creatinine Clearance: 72.3 mL/min (A) (by C-G formula based on SCr of 0.54 mg/dL (L)). Liver Function Tests: Recent Labs  Lab 05/29/21 0423  ALBUMIN 2.0*   No results for input(s): LIPASE, AMYLASE in the last 168 hours. No results for input(s): AMMONIA in the last 168 hours. Coagulation Profile: No results for input(s): INR, PROTIME in the last 168 hours. Cardiac Enzymes: No results for input(s): CKTOTAL, CKMB, CKMBINDEX, TROPONINI in the last 168 hours. BNP (last 3 results) No results for input(s): PROBNP in the last 8760 hours. HbA1C: No results for input(s): HGBA1C in the last 72 hours. CBG: No results for input(s): GLUCAP in the last 168 hours. Lipid Profile: No results for input(s): CHOL, HDL, LDLCALC, TRIG, CHOLHDL, LDLDIRECT in the last 72 hours. Thyroid Function Tests: No results for input(s): TSH, T4TOTAL, FREET4, T3FREE, THYROIDAB in the last 72 hours. Anemia Panel: No results for input(s): VITAMINB12, FOLATE, FERRITIN, TIBC, IRON,  RETICCTPCT in the last 72 hours. Sepsis Labs: No results for input(s): PROCALCITON, LATICACIDVEN in the last 168 hours.  Recent Results (from the past 240 hour(s))  Resp Panel by RT-PCR (Flu A&B, Covid) Nasopharyngeal Swab     Status: None   Collection Time: 05/24/21  5:35 PM   Specimen: Nasopharyngeal Swab; Nasopharyngeal(NP) swabs in vial transport medium  Result Value Ref Range Status   SARS Coronavirus 2 by RT PCR NEGATIVE NEGATIVE Final    Comment: (NOTE) SARS-CoV-2 target nucleic acids are NOT DETECTED.  The SARS-CoV-2 RNA is generally detectable in upper respiratory specimens during the acute phase of infection. The lowest concentration of SARS-CoV-2 viral copies this assay can detect is 138 copies/mL. A negative result does  not preclude SARS-Cov-2 infection and should not be used as the sole basis for treatment or other patient management decisions. A negative result may occur with  improper specimen collection/handling, submission of specimen other than nasopharyngeal swab, presence of viral mutation(s) within the areas targeted by this assay, and inadequate number of viral copies(<138 copies/mL). A negative result must be combined with clinical observations, patient history, and epidemiological information. The expected result is Negative.  Fact Sheet for Patients:  EntrepreneurPulse.com.au  Fact Sheet for Healthcare Providers:  IncredibleEmployment.be  This test is no t yet approved or cleared by the [place] FDA and  has been authorized for detection and/or diagnosis of SARS-CoV-2 by FDA under an Emergency Use Authorization (EUA). This EUA will remain  in effect (meaning this test can be used) for the duration of the COVID-19 declaration under Section 564(b)(1) of the Act, 21 U.S.C.section 360bbb-3(b)(1), unless the authorization is terminated  or revoked sooner.       Influenza A by PCR NEGATIVE NEGATIVE Final   Influenza  B by PCR NEGATIVE NEGATIVE Final    Comment: (NOTE) The Xpert Xpress SARS-CoV-2/FLU/RSV plus assay is intended as an aid in the diagnosis of influenza from Nasopharyngeal swab specimens and should not be used as a sole basis for treatment. Nasal washings and aspirates are unacceptable for Xpert Xpress SARS-CoV-2/FLU/RSV testing.  Fact Sheet for Patients: EntrepreneurPulse.com.au  Fact Sheet for Healthcare Providers: IncredibleEmployment.be  This test is not yet approved or cleared by the [place] FDA and has been authorized for detection and/or diagnosis of SARS-CoV-2 by FDA under an Emergency Use Authorization (EUA). This EUA will remain in effect (meaning this test can be used) for the duration of the COVID-19 declaration under Section 564(b)(1) of the Act, 21 U.S.C. section 360bbb-3(b)(1), unless the authorization is terminated or revoked.  Performed at [facility], [place] [address], [place], [place] [address]       Radiology Studies: No results found.    Scheduled Meds: . dexamethasone  2 mg Oral Q breakfast   Followed by  . [START ON 06/08/2021] dexamethasone  1 mg Oral Q breakfast  . feeding supplement  237 mL Oral BID BM  . ipratropium-albuterol  3 mL Nebulization BID  . mometasone-formoterol  2 puff Inhalation BID  . multivitamin with minerals  1 tablet Oral Daily  . pantoprazole  40 mg Oral Daily   Continuous Infusions: . magnesium sulfate bolus IVPB       LOS: 9 days      Time spent: 15 minutes   Dessa Phi, DO [facility] 06/02/2021, 9:28 AM   Available via Epic secure chat 7am-7pm After these hours, please refer to coverage provider listed on [email or web address]

## 2021-06-02 NOTE — Plan of Care (Signed)

## 2021-06-03 DIAGNOSIS — J9509 Other tracheostomy complication: Secondary | ICD-10-CM | POA: Diagnosis not present

## 2021-06-03 LAB — MAGNESIUM: Magnesium: 1.9 mg/dL (ref 1.7–2.4)

## 2021-06-03 MED ORDER — SODIUM CHLORIDE 0.9 % IV SOLN: INTRAVENOUS | Status: DC

## 2021-06-03 MED ORDER — POLYETHYLENE GLYCOL 3350 17 G PO PACK
17.0000 g | PACK | Freq: Every day | ORAL | Status: DC
  Administered 2021-06-03 – 2021-06-13 (×9): 17 g via ORAL
  Filled 2021-06-03 (×10): qty 1

## 2021-06-03 NOTE — Plan of Care (Signed)

## 2021-06-03 NOTE — Progress Notes (Signed)
Speech Language Pathology Treatment:    Patient Details Name: Perry Waller MRN: [id number] DOB: May 12, 1961 Today's Date: 06/03/2021 Time: 3710-6269 SLP Time Calculation (min) (ACUTE ONLY): 49.87 min  Assessment / Plan / Recommendation Clinical Impression  Pt was seen for treatment. Pt's RN, Barnett Applebaum,  reported that the pt has been conssuming meals and pills without overt s/sx of aspiration and that his tolerance of the [name] was good. [name] was in place upon SLP's entry. Pt tolerated [name] for the duration of the session with stable vitals and no respiratory distress. Vocal quality was breathy and vocal intensity reduced; repetition was frequently needed for speech intelligibility. Pt reported that he believes his speech sounds "pretty good" and he was able to independently demonstrate donning and doffing of the [name]. Pt stated that his mouth has been sore, and that eating has been painful, but that he was able to consume all of lunch. SLP assisted pt in ordering his next two meals since staff is unable to hear/understand him over the phone. Pt was seen during part of dinner with a meal of pot roast beef, mashed potatoes, pudding, and honey thick liquids. No s/sx of aspiration were noted with solids or liquids and oral phase appeared functional. Pt's current diet will be continued. SLP will continue to follow pt.    HPI HPI: Perry Waller is a 60 y/o M with history of laryngeal cancer, status post chemo and XRT, status post trach in 2020 and PEG tube. Pt did complete SLP visits in 2021 during radiation but did not attend all sessions and eventually stopped attending though dysphagia was persistent. His last MBS on 01/18/20 recommended regular solids and thin liquids "Cued cough cleared trace aspirates and supraglottic swallow with extended breath hold effective to prevent aspiration." The study also repoted narrow airway. Pt has had subsequent decannulation and removal of PEG tube but presented to radiation oncologist on  05/15/21 and then ENT with stridor, now s/p tracheostomy 05/25/21 and direct laryngoscopy and biopsy. Pt has a persistent tracheocutaneous fistula. During bronchoscopy, pt desaturated and there was difficulty placing a 6 cuffed shiley and concern for damage to the posterior tracheal Swearingin. A 4 -0 shiley was placed. during laryngoscopy pt was found to have "Extensive fibrinous debris and thick drainage coating and obstructing the supraglottis and glottis, which was easily suctioned. Mild edema and post-radiation changes of the true and false vocal folds and arytenoids. No mass lesion appreciated." SLP was ordered to evaluate swallowing, but ENT note specifically stated pt not be given Barium, only Gastrografin, likely due to concern for fistula.      SLP Plan  Continue with current plan of care       Recommendations  Diet recommendations: Regular;Honey-thick liquid Liquids provided via: Cup;Straw Medication Administration: Whole meds with puree Supervision: Patient able to self feed Compensations: Slow rate;Small sips/bites Postural Changes and/or Swallow Maneuvers: Seated upright 90 degrees      Patient may use Passy-Muir Speech Valve: During all waking hours (remove during sleep);During PO intake/meals [name] Supervision: Intermittent         Oral Care Recommendations: Oral care BID Follow up Recommendations: Skilled Nursing facility SLP Visit Diagnosis: Dysphagia, pharyngeal phase (R13.13) Plan: Continue with current plan of care       Rehmat Murtagh I. Hardin Negus, [place], [place] Office number [phone] Pager [place] 06/03/2021, 4:45 PM

## 2021-06-03 NOTE — Progress Notes (Signed)
    Progress Note from the Palliative Medicine Team at [facility]   Patient Name: Perry Waller        Date: 06/03/2021 DOB: December 12, 1961  Age: 60 y.o. MRN#: [id number] Attending Physician: Dessa Phi, DO Primary Care Physician: Pcp, No Admit Date: 05/24/2021   Medical records reviewed   61 year old male admitted on 55 from ENT clinic with tracheostomy obstruction.  He is now status post tracheostomy revision.  Past medical history significant for stage IV laryngeal cancer status post chemo and radiation.  Trach placement on 8 of 2020  Past medical history significant for COPD, tobacco use, alcohol abuse, severe protein calorie malnutrition.  This NP visited patient at the bedside as a follow up for palliative medicine needs and emotional support.  Created space and opportunity for patient to explore his thoughts and feelings regarding his current medical situation.  Communication is minimal but patient verbalizes with use of his Passy-Muir valve that he is comfortable with decision to transition to skilled nursing facility from the hospital.  Recommend outpatient community-based palliative on discharge  Discussed with patient the importance of continued conversation with his medical providers regarding overall plan of care and treatment options,  ensuring decisions are within the context of the patients values and GOCs.  Questions and concerns addressed   Discussed with LCSW  Total time spent on the unit was 25 minutes  Greater than 50% of the time was spent in counseling and coordination of care  Wadie Lessen NP  Palliative Medicine Team Team Phone # [phone] Pager [phone]

## 2021-06-03 NOTE — Progress Notes (Signed)
Nutrition Follow-up  DOCUMENTATION CODES:   Severe malnutrition in context of chronic illness,Underweight  INTERVENTION:  -Continue Magic cup TID with meals, each supplement provides 290 kcal and 9 grams of protein -Continue Ensure Enlive po BID, each supplement provides 350 kcal and 20 grams of protein -- thickened to HONEY consistency -Continue MVI with minerals daily  NUTRITION DIAGNOSIS:   Severe Malnutrition related to chronic illness as evidenced by severe fat depletion,severe muscle depletion. - ongoing  GOAL:   Patient will meet greater than or equal to 90% of their needs - progressing   MONITOR:   PO intake,Supplement acceptance,Labs,Weight trends  REASON FOR ASSESSMENT:   Consult Assessment of nutrition requirement/status  ASSESSMENT:   60 yo male admitted with trach obstruction. PMH includes stage IV laryngeal cancer s/p chemo and radiation therapy, s/p trach in August 2020, COPD  05/28 s/p trach revision 05/30 diet upgraded to regular with honey thick liquids 06/02 trach changed to shiley size 4 cuffless   Per MD, pt is medically stable for discharge, awaiting SNF placement.   Pt reports feeling a little nauseated this morning, but has otherwise had significant improvement in appetite/intake. Meal completions charted as 25-100% x 5 recorded meals (74% average meal intake). Per RN, pt doing well with supplement consumption. Recommend continue current nutrition plan of care.  UOP: 1355ml x24 hours  Labs reviewed. Medications: decadron, mvi with minerals, protonix, miralax   Diet Order:   Diet Order            Diet regular Room service appropriate? Yes; Fluid consistency: Honey Thick  Diet effective now                 EDUCATION NEEDS:   Education needs have been addressed  Skin:  Skin Assessment: Reviewed RN Assessment  Last BM:  6/6 type 1  Height:   Ht Readings from Last 1 Encounters:  05/25/21 5\' 6"  (1.676 m)    Weight:   Wt  Readings from Last 1 Encounters:  06/03/21 49.9 kg    BMI:  Body mass index is 17.76 kg/m.  Estimated Nutritional Needs:   Kcal:  1650-1850 kcals  Protein:  80-90 g  Fluid:  >/= 1.7 L    Larkin Ina, MS, RD, LDN RD pager number and weekend/on-call pager number located in [place].

## 2021-06-03 NOTE — Progress Notes (Signed)
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  Patient was admitted from the ENTs office with concerns for tracheostomy obstruction and plans for tracheostomy revision procedure.  Patient has undergone revision of the tracheostomy on 5/28.  ENT team is managing patient's tracheostomy tube postop.  Patient is medically stable for discharge, awaiting SNF placement.   New events last 24 hours / Subjective: States that he has been waiting for an hour to get to the bedside commode.  He wants to have a bowel movement this morning.  Admits to a little nausea.    Assessment & Plan:   Principal Problem:   Tracheostomy obstruction (HCC) Active Problems:   Tobacco abuse   Protein-calorie malnutrition, severe   Tracheal stenosis following tracheostomy (HCC)   Dehydration   Prerenal azotemia   COPD (chronic obstructive pulmonary disease) (HCC)   Chronic anemia    Tracheostomy obstruction -Status post tracheostomy revision by Dr. Fredric Dine ENT on 5/28 -Trach changed to [facility] size 4 cuffless without difficulty on 6/2 -Follow up with Dr. Constance Holster in 6 months, follow up with trach clinic after SNF discharge   Severe protein calorie malnutrition -Appreciate dietitian, palliative care medicine  COPD -Stable  Tobacco abuse -Cessation counseling -Nicotine patch daily   Sinus tachycardia -Will give IVF for hydration. Patient remains asymptomatic, afebrile     DVT prophylaxis:  SCDs Start: 05/24/21 2000  Code Status:     Code Status Orders  (From admission, onward)         Start     Ordered   05/24/21 2000  Do not attempt resuscitation (DNR)  Continuous       Question  Answer Comment  In the event of cardiac or respiratory ARREST Do not call a "code blue"   In the event of cardiac or respiratory ARREST Do not perform Intubation, CPR, defibrillation or ACLS   In the event of cardiac or respiratory ARREST Use medication by any route, position, wound care, and other measures to relive pain and suffering. May use oxygen, suction and manual treatment of airway obstruction as needed for comfort.      05/24/21 2000        Code Status History    Date Active Date Inactive Code Status Order ID Comments User Context   08/13/2019 0956 08/17/2019 1922 DNR [id number]  Lorenso Quarry Inpatient   08/06/2019 0133 08/13/2019 0956 Full Code [id number]  Ivor Costa, MD Inpatient   06/29/2019 0834 06/30/2019 1922 Full Code [id number]  Lars Mage, MD ED   Advance Care Planning Activity     Family Communication: none at bedside Disposition Plan:  Status is: Inpatient  Remains inpatient appropriate because:Inpatient level of care appropriate due to severity of illness   Dispo: The patient is from: Home              Anticipated d/c is to: SNF              Patient currently is medically stable to d/c.  Awaiting SNF placement.   Difficult to place patient Yes      Consultants:   ENT  Palliative care medicine   Antimicrobials:  Anti-infectives (From admission, onward)   None  Objective: Vitals:   06/03/21 0409 06/03/21 0500 06/03/21 0632 06/03/21 0750  BP:   (!) 128/91   Pulse: 98  (!) 112 (!) 128  Resp: 18  20 20   Temp:   99.7 F (37.6 C)   TempSrc:      SpO2: 98%  100% 94%  Weight:  49.9 kg    Height:        Intake/Output Summary (Last 24 hours) at 06/03/2021 1026 Last data filed at 06/03/2021 0644 Gross per 24 hour  Intake 960 ml  Output 1350 ml  Net -390 ml   Filed Weights   05/24/21 1700 05/29/21 0555 06/03/21 0500  Weight: 40.8 kg 51.4 kg 49.9 kg    Examination: General exam: Appears calm and comfortable  Respiratory  system: Clear to auscultation. Respiratory effort normal.  Trach collar in place Cardiovascular system: S1 & S2 heard, tachycardic, regular rhythm. No pedal edema. Gastrointestinal system: Abdomen is nondistended, soft and nontender. Normal bowel sounds heard. Central nervous system: Alert and oriented. Non focal exam. Speech clear  Extremities: Symmetric in appearance bilaterally  Skin: No rashes, lesions or ulcers on exposed skin  Psychiatry: Judgement and insight appear stable. Mood & affect appropriate.   Data Reviewed: I have personally reviewed following labs and imaging studies  CBC: Recent Labs  Lab 05/29/21 0423  WBC 5.3  NEUTROABS 4.0  HGB 8.2*  HCT 26.1*  MCV 91.3  PLT 226   Basic Metabolic Panel: Recent Labs  Lab 05/29/21 0423 05/30/21 0203 05/31/21 0217 06/01/21 0211 06/02/21 0220 06/03/21 0435  NA 136 133* 135 136 135  --   K 3.4* 3.4* 4.1 4.2 3.8  --   CL 97* 95* 98 93* 96*  --   CO2 30 29 29 30 30   --   GLUCOSE 196* 191* 142* 127* 127*  --   BUN 5* <5* 11 13 11   --   CREATININE 0.51* 0.48* 0.52* 0.52* 0.54*  --   CALCIUM 8.3* 8.3* 8.6* 9.0 8.7*  --   MG 1.4* 1.6* 1.6* 1.6* 1.6* 1.9  PHOS 2.4*  --   --   --   --   --    GFR: Estimated Creatinine Clearance: 70.2 mL/min (A) (by C-G formula based on SCr of 0.54 mg/dL (L)). Liver Function Tests: Recent Labs  Lab 05/29/21 0423  ALBUMIN 2.0*   No results for input(s): LIPASE, AMYLASE in the last 168 hours. No results for input(s): AMMONIA in the last 168 hours. Coagulation Profile: No results for input(s): INR, PROTIME in the last 168 hours. Cardiac Enzymes: No results for input(s): CKTOTAL, CKMB, CKMBINDEX, TROPONINI in the last 168 hours. BNP (last 3 results) No results for input(s): PROBNP in the last 8760 hours. HbA1C: No results for input(s): HGBA1C in the last 72 hours. CBG: No results for input(s): GLUCAP in the last 168 hours. Lipid Profile: No results for input(s): CHOL, HDL, LDLCALC,  TRIG, CHOLHDL, LDLDIRECT in the last 72 hours. Thyroid Function Tests: No results for input(s): TSH, T4TOTAL, FREET4, T3FREE, THYROIDAB in the last 72 hours. Anemia Panel: No results for input(s): VITAMINB12, FOLATE, FERRITIN, TIBC, IRON, RETICCTPCT in the last 72 hours. Sepsis Labs: No results for input(s): PROCALCITON, LATICACIDVEN in the last 168 hours.  Recent Results (from the past 240 hour(s))  Resp Panel by RT-PCR (Flu A&B, Covid) Nasopharyngeal Swab     Status: None   Collection Time: 05/24/21  5:35 PM   Specimen: Nasopharyngeal Swab; Nasopharyngeal(NP) swabs in vial transport medium  Result Value Ref Range Status   SARS Coronavirus 2 by RT PCR NEGATIVE NEGATIVE Final    Comment: (NOTE) SARS-CoV-2 target nucleic acids are NOT DETECTED.  The SARS-CoV-2 RNA is generally detectable in upper respiratory specimens during the acute phase of infection. The lowest concentration of SARS-CoV-2 viral copies this assay can detect is 138 copies/mL. A negative result does not preclude SARS-Cov-2 infection and should not be used as the sole basis for treatment or other patient management decisions. A negative result may occur with  improper specimen collection/handling, submission of specimen other than nasopharyngeal swab, presence of viral mutation(s) within the areas targeted by this assay, and inadequate number of viral copies(<138 copies/mL). A negative result must be combined with clinical observations, patient history, and epidemiological information. The expected result is Negative.  Fact Sheet for Patients:  EntrepreneurPulse.com.au  Fact Sheet for Healthcare Providers:  IncredibleEmployment.be  This test is no t yet approved or cleared by the [place] FDA and  has been authorized for detection and/or diagnosis of SARS-CoV-2 by FDA under an Emergency Use Authorization (EUA). This EUA will remain  in effect (meaning this test can be  used) for the duration of the COVID-19 declaration under Section 564(b)(1) of the Act, 21 U.S.C.section 360bbb-3(b)(1), unless the authorization is terminated  or revoked sooner.       Influenza A by PCR NEGATIVE NEGATIVE Final   Influenza B by PCR NEGATIVE NEGATIVE Final    Comment: (NOTE) The Xpert Xpress SARS-CoV-2/FLU/RSV plus assay is intended as an aid in the diagnosis of influenza from Nasopharyngeal swab specimens and should not be used as a sole basis for treatment. Nasal washings and aspirates are unacceptable for Xpert Xpress SARS-CoV-2/FLU/RSV testing.  Fact Sheet for Patients: EntrepreneurPulse.com.au  Fact Sheet for Healthcare Providers: IncredibleEmployment.be  This test is not yet approved or cleared by the [place] FDA and has been authorized for detection and/or diagnosis of SARS-CoV-2 by FDA under an Emergency Use Authorization (EUA). This EUA will remain in effect (meaning this test can be used) for the duration of the COVID-19 declaration under Section 564(b)(1) of the Act, 21 U.S.C. section 360bbb-3(b)(1), unless the authorization is terminated or revoked.  Performed at [facility], [place] [address] [place], [place] [address]       Radiology Studies: No results found.    Scheduled Meds: . dexamethasone  2 mg Oral Q breakfast   Followed by  . [START ON 06/08/2021] dexamethasone  1 mg Oral Q breakfast  . feeding supplement  237 mL Oral BID BM  . mometasone-formoterol  2 puff Inhalation BID  . multivitamin with minerals  1 tablet Oral Daily  . pantoprazole  40 mg Oral Daily  . polyethylene glycol  17 g Oral Daily   Continuous Infusions:    LOS: 10 days      Time spent: 20 minutes   Dessa Phi, DO [facility] 06/03/2021, 10:26 AM   Available via Epic secure chat 7am-7pm After these hours, please refer to coverage provider listed on [email or web address]

## 2021-06-03 NOTE — TOC Progression Note (Signed)
Transition of Care [facility]) - Progression Note    Patient Details  Name: Perry Waller MRN: [id number] Date of Birth: 1961-11-20  Transition of Care [facility]) CM/SW [place], [place] Phone Number: 06/03/2021, 12:25 PM  Clinical Narrative:    Patient still has no SNF bed offers. CSW expanded search.    Expected Discharge Plan: Skilled Nursing Facility Barriers to Discharge: Continued Medical Work up,SNF Pending bed offer  Expected Discharge Plan and Services Expected Discharge Plan: [place] In-house Referral: Clinical Social Work Discharge Planning Services: CM Consult                                           Social Determinants of Health (SDOH) Interventions    Readmission Risk Interventions No flowsheet data found.

## 2021-06-04 MED ORDER — DIPHENHYDRAMINE HCL 25 MG PO CAPS
25.0000 mg | ORAL_CAPSULE | Freq: Four times a day (QID) | ORAL | Status: DC | PRN
  Administered 2021-06-04: 25 mg via ORAL
  Filled 2021-06-04: qty 1

## 2021-06-04 NOTE — Progress Notes (Signed)
Occupational Therapy Treatment Patient Details Name: Perry Waller MRN: [id number] DOB: Apr 24, 1961 Today's Date: 06/04/2021    History of present illness 60 y.o. male presenting on 5/27 from ENT clinic with tracheostomy obstruction. Patient transferred to [facility] for tracheostomy revision. 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   OT comments  Pt making steady progress towards OT goals this session. Pt asleep upon arrival but easily able to arouse and agreeable to OT intervention. Session focus on implementing BUE therex HEP to increase strength and overall activity tolerance for higher level BADLs and functional mobility. Pt completed therex as indicated below with no reports of increased pain. Pt declined OOB mobility or BADL participation. Pt with moments of mild agitation as this OTA having a difficult time understanding, pt did want this OTA to call his sister to bring him some hair grease however she did not pick up and her VM was full. Pt would continue to benefit from skilled occupational therapy while admitted and after d/c to address the below listed limitations in order to improve overall functional mobility and facilitate independence with BADL participation. DC plan remains appropriate, will follow acutely per POC.   pt on 5LPM 28% TC with SPO2 WFL, PMV donned during session     Follow Up Recommendations  SNF;Supervision/Assistance - 24 hour    Equipment Recommendations  Other (comment) (defer to next level of care)    Recommendations for Other Services      Precautions / Restrictions Precautions Precautions: Fall Restrictions Weight Bearing Restrictions: No       Mobility Bed Mobility          Transfers    Balance           ADL either performed or assessed with clinical judgement   ADL Overall ADL's : Needs assistance/impaired     Grooming: Set  up Grooming Details (indicate cue type and reason): applying chap stick to lips with set- up assist                               General ADL Comments: session focus on increasing activity tolerance through BUE therex with level 1 therband     Vision       Perception     Praxis      Cognition Arousal/Alertness: Awake/alert Behavior During Therapy: WFL for tasks assessed/performed;Agitated (agitated at times when OTA unable to understand pt) Overall Cognitive Status: Difficult to assess Area of Impairment: Safety/judgement                         Safety/Judgement: Decreased awareness of safety     General Comments: pt making minimal efforts to verbalize, when pt does speak pt presents with low breathy speech often difficult to interpret. pt was able to state name and DOB        Exercises Exercises:  All therex completed from upright position in bed General Exercises - Upper Extremity Shoulder Flexion: Strengthening;Both;10 reps;Seated;Theraband Theraband Level (Shoulder Flexion): Level 1 (Yellow) Shoulder Extension: Strengthening;Both;Seated;Theraband Theraband Level (Shoulder Extension): Level 1 (Yellow) Shoulder Horizontal ABduction: Strengthening;Both;10 reps;Seated;Theraband Theraband Level (Shoulder Horizontal Abduction): Level 1 (Yellow) Shoulder Horizontal ADduction: Strengthening;Both;10 reps;Seated;Theraband Theraband Level (Shoulder Horizontal Adduction): Level 1 (Yellow) Elbow Flexion: Strengthening;Both;10 reps;Seated;Theraband Theraband Level (Elbow Flexion): Level 1 (Yellow) Elbow Extension: Strengthening;Both;10 reps;Seated;Theraband Theraband Level (Elbow Extension): Level 1 (Yellow)  Other Exercises Other Exercises: punches with level 1 theraband; BUEs x10 reps   Shoulder Instructions       General Comments issued pt level 1 theraband and tied 2 bands to bed rails to work on BUE strength and endurance, pt on 5LPM 28% TC with SPO2  WFL, PMV donned during session     Pertinent Vitals/ Pain       Pain Assessment: No/denies pain Faces Pain Scale: No hurt  Home Living                                          Prior Functioning/Environment              Frequency  Min 2X/week        Progress Toward Goals  OT Goals(current goals can now be found in the care plan section)  Progress towards OT goals: Progressing toward goals  Acute Rehab OT Goals Patient Stated Goal: to call his sister OT Goal Formulation: With patient Time For Goal Achievement: 06/10/21 Potential to Achieve Goals: Good  Plan Discharge plan remains appropriate;Frequency remains appropriate    Co-evaluation                 AM-PAC OT "6 Clicks" Daily Activity     Outcome Measure   Help from another person eating meals?: A Little Help from another person taking care of personal grooming?: A Little Help from another person toileting, which includes using toliet, bedpan, or urinal?: A Lot Help from another person bathing (including washing, rinsing, drying)?: A Lot Help from another person to put on and taking off regular upper body clothing?: A Little Help from another person to put on and taking off regular lower body clothing?: A Lot 6 Click Score: 15    End of Session Equipment Utilized During Treatment: Other (comment);Oxygen (5LPM; level 1 theraband)  OT Visit Diagnosis: Unsteadiness on feet (R26.81);Muscle weakness (generalized) (M62.81)   Activity Tolerance Patient tolerated treatment well   Patient Left in bed;with call bell/phone within reach;with bed alarm set   Nurse Communication Mobility status        Time: 7867-5449 OT Time Calculation (min): 14 min  Charges: OT General Charges $OT Visit: 1 Visit OT Treatments $Therapeutic Exercise: 8-22 mins  Perry Alto., COTA/L Acute Rehabilitation Services [phone]    Perry Waller 06/04/2021, 4:12 PM

## 2021-06-04 NOTE — Progress Notes (Signed)
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  Patient was admitted from the ENTs office with concerns for tracheostomy obstruction and plans for tracheostomy revision procedure.  Patient has undergone revision of the tracheostomy on 5/28.  ENT team is managing patient's tracheostomy tube postop.  Patient is medically stable for discharge, awaiting SNF placement.   New events last 24 hours / Subjective: Complains of itching on his scalp  Assessment & Plan:   Principal Problem:   Tracheostomy obstruction (HCC) Active Problems:   Tobacco abuse   Protein-calorie malnutrition, severe   Tracheal stenosis following tracheostomy (HCC)   Dehydration   Prerenal azotemia   COPD (chronic obstructive pulmonary disease) (HCC)   Chronic anemia    Tracheostomy obstruction -Status post tracheostomy revision by Dr. Fredric Dine ENT on 5/28 -Trach changed to [facility] size 4 cuffless without difficulty on 6/2 -Follow up with Dr. Constance Holster in 6 months, follow up with trach clinic after SNF discharge   Severe protein calorie malnutrition -Appreciate dietitian, palliative care medicine  COPD -Stable  Tobacco abuse -Cessation counseling -Nicotine patch daily   Sinus tachycardia -Resolved  DVT prophylaxis:  SCDs Start: 05/24/21 2000  Code Status:     Code Status Orders  (From admission, onward)         Start     Ordered   05/24/21 2000  Do not attempt resuscitation (DNR)  Continuous       Question Answer Comment  In the event of cardiac or respiratory ARREST Do not call a "code blue"   In the event of cardiac or respiratory ARREST Do not perform Intubation, CPR, defibrillation  or ACLS   In the event of cardiac or respiratory ARREST Use medication by any route, position, wound care, and other measures to relive pain and suffering. May use oxygen, suction and manual treatment of airway obstruction as needed for comfort.      05/24/21 2000        Code Status History    Date Active Date Inactive Code Status Order ID Comments User Context   08/13/2019 0956 08/17/2019 1922 DNR [id number]  Lorenso Quarry Inpatient   08/06/2019 0133 08/13/2019 0956 Full Code [id number]  Ivor Costa, MD Inpatient   06/29/2019 0834 06/30/2019 1922 Full Code [id number]  Lars Mage, MD ED   Advance Care Planning Activity     Family Communication: none at bedside Disposition Plan:  Status is: Inpatient  Remains inpatient appropriate because:Inpatient level of care appropriate due to severity of illness   Dispo: The patient is from: Home              Anticipated d/c is to: SNF              Patient currently is medically stable to d/c.  Awaiting SNF placement.   Difficult to place patient Yes      Consultants:   ENT  Palliative care medicine   Antimicrobials:  Anti-infectives (From admission, onward)   None       Objective: Vitals:   06/04/21 0345 06/04/21 0519 06/04/21 0526 06/04/21 0737  BP:  119/83    Pulse: 90 96  (!) 102  Resp: 16 20  20   Temp:  99.3 F (37.4 C)    TempSrc:      SpO2: 98% 100%  97%  Weight:   47.3 kg   Height:        Intake/Output Summary (Last 24 hours) at 06/04/2021 1018 Last data filed at 06/04/2021 9150 Gross per 24 hour  Intake 120 ml  Output 2000 ml  Net -1880 ml   Filed Weights   05/29/21 0555 06/03/21 0500 06/04/21 0526  Weight: 51.4 kg 49.9 kg 47.3 kg    Examination: General exam: Appears calm and comfortable  Respiratory system: Clear to auscultation. Respiratory effort normal.  Trach collar in place Cardiovascular system: S1 & S2 heard, RRR. No pedal edema. Gastrointestinal system: Abdomen is nondistended, soft  and nontender. Normal bowel sounds heard. Central nervous system: Alert and oriented. Non focal exam. Speech clear  Extremities: Symmetric in appearance bilaterally  Skin: No rashes, lesions or ulcers on exposed skin  Psychiatry: Judgement and insight appear stable. Mood & affect appropriate.    Data Reviewed: I have personally reviewed following labs and imaging studies  CBC: Recent Labs  Lab 05/29/21 0423  WBC 5.3  NEUTROABS 4.0  HGB 8.2*  HCT 26.1*  MCV 91.3  PLT 569   Basic Metabolic Panel: Recent Labs  Lab 05/29/21 0423 05/30/21 0203 05/31/21 0217 06/01/21 0211 06/02/21 0220 06/03/21 0435  NA 136 133* 135 136 135  --   K 3.4* 3.4* 4.1 4.2 3.8  --   CL 97* 95* 98 93* 96*  --   CO2 30 29 29 30 30   --   GLUCOSE 196* 191* 142* 127* 127*  --   BUN 5* <5* 11 13 11   --   CREATININE 0.51* 0.48* 0.52* 0.52* 0.54*  --   CALCIUM 8.3* 8.3* 8.6* 9.0 8.7*  --   MG 1.4* 1.6* 1.6* 1.6* 1.6* 1.9  PHOS 2.4*  --   --   --   --   --    GFR: Estimated Creatinine Clearance: 66.5 mL/min (A) (by C-G formula based on SCr of 0.54 mg/dL (L)). Liver Function Tests: Recent Labs  Lab 05/29/21 0423  ALBUMIN 2.0*   No results for input(s): LIPASE, AMYLASE in the last 168 hours. No results for input(s): AMMONIA in the last 168 hours. Coagulation Profile: No results for input(s): INR, PROTIME in the last 168 hours. Cardiac Enzymes: No results for input(s): CKTOTAL, CKMB, CKMBINDEX, TROPONINI in the last 168 hours. BNP (last 3 results) No results for input(s): PROBNP in the last 8760 hours. HbA1C: No results for input(s): HGBA1C in the last 72 hours. CBG: No results for input(s): GLUCAP in the last 168 hours. Lipid Profile: No results for input(s): CHOL, HDL, LDLCALC, TRIG, CHOLHDL, LDLDIRECT in the last 72 hours. Thyroid Function Tests: No results for input(s): TSH, T4TOTAL, FREET4, T3FREE, THYROIDAB in the last 72 hours. Anemia Panel: No results for input(s): VITAMINB12, FOLATE,  FERRITIN, TIBC, IRON, RETICCTPCT in the last 72 hours. Sepsis Labs: No results for input(s): PROCALCITON, LATICACIDVEN in the last 168 hours.  No results found for this or any previous visit (from the past 240 hour(s)).    Radiology Studies: No results found.    Scheduled Meds: . dexamethasone  2 mg Oral Q breakfast   Followed by  . [START ON 06/08/2021] dexamethasone  1 mg Oral Q breakfast  . feeding supplement  237 mL Oral BID BM  . mometasone-formoterol  2 puff Inhalation BID  . multivitamin with minerals  1 tablet Oral Daily  .  pantoprazole  40 mg Oral Daily  . polyethylene glycol  17 g Oral Daily   Continuous Infusions:    LOS: 11 days      Time spent: 15 minutes   Dessa Phi, DO [facility] 06/04/2021, 10:18 AM   Available via Epic secure chat 7am-7pm After these hours, please refer to coverage provider listed on [email or web address]

## 2021-06-04 NOTE — Plan of Care (Signed)

## 2021-06-04 NOTE — Progress Notes (Signed)
Physical Therapy Treatment Patient Details Name: Perry Waller MRN: [id number] DOB: 11-14-1961 Today's Date: 06/04/2021    History of Present Illness 60 y.o. male presenting on 5/27 from ENT clinic with tracheostomy obstruction. Patient transferred to [facility] for tracheostomy revision. 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    PT Comments    Patient using [name] throughout session, and yet voice is very weak and hard to understand. Pt able to write out what he wanted to say and this helped decrease his frustration with therapist. Patient continues to benefit from balance and gait training as he remains a high fall risk. He was cooperative. Required one seated rest break after ambulating and prior to balance activities with max HR 129 bpm.     Follow Up Recommendations  SNF;Other (comment) (vs LTC)     Equipment Recommendations  Other (comment) (rollator)    Recommendations for Other Services       Precautions / Restrictions Precautions Precautions: Fall    Mobility  Bed Mobility Overal bed mobility: Modified Independent Bed Mobility: Supine to Sit     Supine to sit: Modified independent (Device/Increase time)          Transfers Overall transfer level: Needs assistance Equipment used: 4-wheeled walker Transfers: Sit to/from Stand Sit to Stand: Min guard         General transfer comment: Min guard for safety as pt tries to pull up on rollator and it slides. Repeated transfer x 2 with pt still pulling on rollator and not reaching back to recliner  Ambulation/Gait Ambulation/Gait assistance: Min guard Gait Distance (Feet): 150 Feet Assistive device: 4-wheeled walker Gait Pattern/deviations: Step-through pattern;Decreased stride length Gait velocity: faster than he can control rollator   General Gait Details: Assist for safety as he pushes rollator too far ahead  and has increased drift rt and left. No overt loss of balance using rollator   Stairs             Wheelchair Mobility    Modified Rankin (Stroke Patients Only)       Balance Overall balance assessment: Needs assistance Sitting-balance support: No upper extremity supported;Feet supported Sitting balance-Leahy Scale: Fair     Standing balance support: During functional activity;No upper extremity supported Standing balance-Leahy Scale: Fair Standing balance comment: can maintain static stance without UE's, but needing AD or external support dynamically.             High level balance activites: Head turns;Other (comment) High Level Balance Comments: standing wiht feet shoulder width and no UE support--head turns left and rt, wt-shfiting Lt and rt; closes eyes for 10 sec with normal sway; when asked to attempt slow marching, pt reaching for light UE support via bil UEs            Cognition Arousal/Alertness: Awake/alert Behavior During Therapy: WFL for tasks assessed/performed Overall Cognitive Status: Impaired/Different from baseline Area of Impairment: Safety/judgement                         Safety/Judgement: Decreased awareness of safety     General Comments: walks faster than he can safely manage the rollator      Exercises      General Comments General comments (skin integrity, edema, etc.): HR 128 with sats 97% using [name] on room air while walking      Pertinent Vitals/Pain Pain Assessment: Faces Faces Pain Scale:  No hurt    Home Living                      Prior Function            PT Goals (current goals can now be found in the care plan section) Acute Rehab PT Goals Patient Stated Goal: "To get up outta here" Time For Goal Achievement: 06/10/21 Potential to Achieve Goals: Good Progress towards PT goals: Progressing toward goals    Frequency    Min 3X/week      PT Plan Current plan remains appropriate  (anticipate he will need LTC ultimately)    Co-evaluation              AM-PAC PT "6 Clicks" Mobility   Outcome Measure  Help needed turning from your back to your side while in a flat bed without using bedrails?: None Help needed moving from lying on your back to sitting on the side of a flat bed without using bedrails?: None Help needed moving to and from a bed to a chair (including a wheelchair)?: A Little Help needed standing up from a chair using your arms (e.g., wheelchair or bedside chair)?: A Little Help needed to walk in hospital room?: A Little Help needed climbing 3-5 steps with a railing? : A Little 6 Click Score: 20    End of Session   Activity Tolerance: Patient tolerated treatment well Patient left: with call bell/phone within reach;in chair;with chair alarm set Nurse Communication: Mobility status PT Visit Diagnosis: Unsteadiness on feet (R26.81);Muscle weakness (generalized) (M62.81);Difficulty in walking, not elsewhere classified (R26.2)     Time: 0375-4360 PT Time Calculation (min) (ACUTE ONLY): 26 min  Charges:  $Gait Training: 8-22 mins $Therapeutic Exercise: 8-22 mins                      Arby Barrette, PT Pager [phone]    Rexanne Mano 06/04/2021, 2:27 PM

## 2021-06-05 LAB — MAGNESIUM: Magnesium: 1.7 mg/dL (ref 1.7–2.4)

## 2021-06-05 LAB — BASIC METABOLIC PANEL
Anion gap: 10 (ref 5–15)
BUN: 14 mg/dL (ref 6–20)
CO2: 34 mmol/L — ABNORMAL HIGH (ref 22–32)
Calcium: 9.2 mg/dL (ref 8.9–10.3)
Chloride: 91 mmol/L — ABNORMAL LOW (ref 98–111)
Creatinine, Ser: 0.51 mg/dL — ABNORMAL LOW (ref 0.61–1.24)
GFR, Estimated: >60 mL/min (ref >60)
Glucose, Bld: 93 mg/dL (ref 70–99)
Potassium: 4 mmol/L (ref 3.5–5.1)
Sodium: 135 mmol/L (ref 135–145)

## 2021-06-05 MED ORDER — BENZOCAINE 10 % MT GEL
OROMUCOSAL | Status: DC | PRN
  Administered 2021-06-05: 1 via OROMUCOSAL
  Filled 2021-06-05 (×2): qty 9

## 2021-06-05 NOTE — Progress Notes (Signed)
    Progress Note from the Palliative Medicine Team at [facility]   Patient Name: Perry Waller        Date: 06/05/2021 DOB: 30-Aug-1961  Age: 60 y.o. MRN#: [id number] Attending Physician: Damita Lack, MD Primary Care Physician: Pcp, No Admit Date: 05/24/2021   Medical records reviewed   60 year old male admitted on 28 from ENT clinic with tracheostomy obstruction.  He is now status post tracheostomy revision.  Past medical history significant for stage IV laryngeal cancer status post chemo and radiation.  Trach placement on 8 of 2020  Past medical history significant for COPD, tobacco use, alcohol abuse, severe protein calorie malnutrition.  This NP visited patient at the bedside as a follow up for palliative medicine needs and emotional support.  Patient  is comfortable with decision to transition to skilled nursing facility from the hospital.  No symptom management needs at this time  Recommend outpatient community-based palliative on discharge  Discussed with patient the importance of continued conversation with his medical providers regarding overall plan of care and treatment options,  ensuring decisions are within the context of the patients values and GOCs.  Questions and concerns addressed   Discussed with LCSW and attending    PMT will sign off, please re consult PMT needs.   Total time spent on the unit was 15 minutes  Greater than 50% of the time was spent in counseling and coordination of care  Wadie Lessen NP  Palliative Medicine Team Team Phone # [phone] Pager [phone]

## 2021-06-05 NOTE — Plan of Care (Signed)

## 2021-06-05 NOTE — TOC Progression Note (Addendum)
Transition of Care [facility]) - Progression Note    Patient Details  Name: Perry Waller MRN: [id number] Date of Birth: 1961/10/20  Transition of Care [facility]) CM/SW Contact  Joanne Chars, LCSW Phone Number: 06/05/2021, 1:33 PM  Clinical Narrative:    Ebony Hail at [facility] cannot offer bed to pt. LM with Prem at [facility] requesting he review referral.    Carollee Leitz at [facility] reviewing pt. LM with Noreene Filbert of [place] asking her to review referral. 1440: Arbie Cookey calls back and declines. [facility] cannot accept any referrals-covid outbreak LM with [place], [place] with [place] at [facility] and she will review the referral and respond LM with Tammy at [facility] LM with Misty at [employer]: 1440: Misty responded, can't take trachs at [facility] but will forward to another building she works with who can take trachs: [facility] in [place].  LM with Chanda Busing   Expected Discharge Plan: [place] Barriers to Discharge: Continued Medical Work up,SNF Pending bed offer  Expected Discharge Plan and Services Expected Discharge Plan: [place] In-house Referral: Clinical Social Work Discharge Planning Services: CM Consult                                           Social Determinants of Health (SDOH) Interventions    Readmission Risk Interventions No flowsheet data found.

## 2021-06-05 NOTE — Progress Notes (Signed)
Pt sister called on pt behalf to request hair grease and notepad. Unable to reach or leave a voicemail at this time.

## 2021-06-05 NOTE — Progress Notes (Signed)
PROGRESS NOTE    Perry Waller  ENI:[id number] DOB: 10/07/1961 DOA: 05/24/2021 PCP: Pcp, No   Brief Narrative:  60 year old African-American male with past medical history significant for stage IV laryngeal cancer status postchemotherapy and radiation therapy, s/p tracheostomy in August 2020, COPD, chronic tobacco use, severe PEM and chronic anemia. Patient was admitted from the ENTs office with concerns for tracheostomy obstruction and plans for tracheostomy revision procedure. Patient has undergone revision of the tracheostomy on 5/28. ENT team is managing patient's tracheostomy tube postop. Patient is medically stable for discharge, awaiting SNF placement.    Assessment & Plan:   Principal Problem:   Tracheostomy obstruction (HCC) Active Problems:   Tobacco abuse   Protein-calorie malnutrition, severe   Tracheal stenosis following tracheostomy (HCC)   Dehydration   Prerenal azotemia   COPD (chronic obstructive pulmonary disease) (HCC)   Chronic anemia     Tracheostomy obstruction -Status post tracheostomy revision by Dr. Fredric Dine ENT on 5/28. Please keep obturator taped to [place] changed to [place] size 4 cuffless without difficulty on 6/2 -Follow up with Dr. Constance Holster in 6 months, follow up with trach clinic after SNF discharge   Severe protein calorie malnutrition -Appreciate dietitian, palliative care medicine  COPD -Stable  Tobacco abuse -Cessation counseling -Nicotine patch daily   Sinus tachycardia -Resolved  PT/OT=SNF/LTC   DVT prophylaxis: SCDs Start: 05/24/21 2000 Code Status: DNR Family Communication:    Status is: Inpatient  Remains inpatient appropriate because:Unsafe d/c plan   Dispo: The patient is from: Home              Anticipated d/c is to: LTAC              Patient currently is medically stable to d/c.   Difficult to place patient Yes       Nutritional status  Nutrition Problem: Severe Malnutrition Etiology: chronic  illness  Signs/Symptoms: severe fat depletion,severe muscle depletion  Interventions: Ensure Enlive (each supplement provides 350kcal and 20 grams of protein),MVI,Refer to RD note for recommendations,Magic cup  Body mass index is 16.83 kg/m.    Subjective: Doing okay no new complaints.  Review of Systems Otherwise negative except as per HPI, including: General: Denies fever, chills, night sweats or unintended weight loss. Resp: Denies cough, wheezing, shortness of breath. Cardiac: Denies chest pain, palpitations, orthopnea, paroxysmal nocturnal dyspnea. GI: Denies abdominal pain, nausea, vomiting, diarrhea or constipation GU: Denies dysuria, frequency, hesitancy or incontinence MS: Denies muscle aches, joint pain or swelling Neuro: Denies headache, neurologic deficits (focal weakness, numbness, tingling), abnormal gait Psych: Denies anxiety, depression, SI/HI/AVH Skin: Denies new rashes or lesions ID: Denies sick contacts, exotic exposures, travel  Examination:  General exam: Appears calm and comfortable  Respiratory system: Tracheostomy in place, trach collar Cardiovascular system: S1 & S2 heard, RRR. No JVD, murmurs, rubs, gallops or clicks. No pedal edema. Gastrointestinal system: Abdomen is nondistended, soft and nontender. No organomegaly or masses felt. Normal bowel sounds heard. Central nervous system: Alert and oriented. No focal neurological deficits. Extremities: Symmetric 5 x 5 power. Skin: No rashes, lesions or ulcers Psychiatry: Normal mood and affect    Objective: Vitals:   06/04/21 2207 06/04/21 2345 06/05/21 0447 06/05/21 0603  BP: 108/78   102/69  Pulse: 95 97 (!) 103 (!) 102  Resp: 20 18 20 20   Temp: 97.7 F (36.5 C)   98.4 F (36.9 C)  TempSrc:      SpO2: 98% 97% 97% 99%  Weight:      Height:  Intake/Output Summary (Last 24 hours) at 06/05/2021 0738 Last data filed at 06/05/2021 0630 Gross per 24 hour  Intake 360 ml  Output 1800 ml  Net  -1440 ml   Filed Weights   05/29/21 0555 06/03/21 0500 06/04/21 0526  Weight: 51.4 kg 49.9 kg 47.3 kg     Data Reviewed:   CBC: No results for input(s): WBC, NEUTROABS, HGB, HCT, MCV, PLT in the last 168 hours. Basic Metabolic Panel: Recent Labs  Lab 05/30/21 0203 05/31/21 0217 06/01/21 0211 06/02/21 0220 06/03/21 0435 06/05/21 0421  NA 133* 135 136 135  --  135  K 3.4* 4.1 4.2 3.8  --  4.0  CL 95* 98 93* 96*  --  91*  CO2 29 29 30 30   --  34*  GLUCOSE 191* 142* 127* 127*  --  93  BUN <5* 11 13 11   --  14  CREATININE 0.48* 0.52* 0.52* 0.54*  --  0.51*  CALCIUM 8.3* 8.6* 9.0 8.7*  --  9.2  MG 1.6* 1.6* 1.6* 1.6* 1.9 1.7   GFR: Estimated Creatinine Clearance: 66.5 mL/min (A) (by C-G formula based on SCr of 0.51 mg/dL (L)). Liver Function Tests: No results for input(s): AST, ALT, ALKPHOS, BILITOT, PROT, ALBUMIN in the last 168 hours. No results for input(s): LIPASE, AMYLASE in the last 168 hours. No results for input(s): AMMONIA in the last 168 hours. Coagulation Profile: No results for input(s): INR, PROTIME in the last 168 hours. Cardiac Enzymes: No results for input(s): CKTOTAL, CKMB, CKMBINDEX, TROPONINI in the last 168 hours. BNP (last 3 results) No results for input(s): PROBNP in the last 8760 hours. HbA1C: No results for input(s): HGBA1C in the last 72 hours. CBG: No results for input(s): GLUCAP in the last 168 hours. Lipid Profile: No results for input(s): CHOL, HDL, LDLCALC, TRIG, CHOLHDL, LDLDIRECT in the last 72 hours. Thyroid Function Tests: No results for input(s): TSH, T4TOTAL, FREET4, T3FREE, THYROIDAB in the last 72 hours. Anemia Panel: No results for input(s): VITAMINB12, FOLATE, FERRITIN, TIBC, IRON, RETICCTPCT in the last 72 hours. Sepsis Labs: No results for input(s): PROCALCITON, LATICACIDVEN in the last 168 hours.  No results found for this or any previous visit (from the past 240 hour(s)).       Radiology Studies: No results  found.      Scheduled Meds: . dexamethasone  2 mg Oral Q breakfast   Followed by  . [START ON 06/08/2021] dexamethasone  1 mg Oral Q breakfast  . feeding supplement  237 mL Oral BID BM  . mometasone-formoterol  2 puff Inhalation BID  . multivitamin with minerals  1 tablet Oral Daily  . pantoprazole  40 mg Oral Daily  . polyethylene glycol  17 g Oral Daily   Continuous Infusions:   LOS: 12 days   Time spent= 35 mins    Debbra Digiulio Arsenio Loader, MD [facility]  If 7PM-7AM, please contact night-coverage  06/05/2021, 7:38 AM

## 2021-06-06 LAB — BASIC METABOLIC PANEL
Anion gap: 7 (ref 5–15)
BUN: 11 mg/dL (ref 6–20)
CO2: 32 mmol/L (ref 22–32)
Calcium: 8.6 mg/dL — ABNORMAL LOW (ref 8.9–10.3)
Chloride: 95 mmol/L — ABNORMAL LOW (ref 98–111)
Creatinine, Ser: 0.46 mg/dL — ABNORMAL LOW (ref 0.61–1.24)
GFR, Estimated: >60 mL/min (ref >60)
Glucose, Bld: 133 mg/dL — ABNORMAL HIGH (ref 70–99)
Potassium: 3.9 mmol/L (ref 3.5–5.1)
Sodium: 134 mmol/L — ABNORMAL LOW (ref 135–145)

## 2021-06-06 LAB — MAGNESIUM: Magnesium: 1.7 mg/dL (ref 1.7–2.4)

## 2021-06-06 MED ORDER — MAGNESIUM OXIDE -MG SUPPLEMENT 400 (240 MG) MG PO TABS
800.0000 mg | ORAL_TABLET | Freq: Once | ORAL | Status: AC
  Administered 2021-06-06: 800 mg via ORAL
  Filled 2021-06-06: qty 2

## 2021-06-06 NOTE — Progress Notes (Signed)
PROGRESS NOTE    Perry Waller  JOI:[id number] DOB: Oct 06, 1961 DOA: 05/24/2021 PCP: Pcp, No   Brief Narrative:  60 year old African-American male with past medical history significant for stage IV laryngeal cancer status postchemotherapy and radiation therapy, s/p tracheostomy in August 2020, COPD, chronic tobacco use, severe PEM and chronic anemia.  Patient was admitted from the ENTs office with concerns for tracheostomy obstruction and plans for tracheostomy revision procedure.  Patient has undergone revision of the tracheostomy on 5/28.  ENT team is managing patient's tracheostomy tube postop.  Patient is medically stable for discharge, awaiting SNF placement.    Assessment & Plan:   Principal Problem:   Tracheostomy obstruction (HCC) Active Problems:   Tobacco abuse   Protein-calorie malnutrition, severe   Tracheal stenosis following tracheostomy (HCC)   Dehydration   Prerenal azotemia   COPD (chronic obstructive pulmonary disease) (HCC)   Chronic anemia      Tracheostomy obstruction -Status post tracheostomy revision by Dr. Fredric Dine ENT on 5/28. Please keep obturator taped to [place] changed to [place] size 4 cuffless without difficulty on 6/2 -Follow up with Dr. Constance Holster in 6 months, follow up with trach clinic after SNF discharge    Severe protein calorie malnutrition -Appreciate dietitian, palliative care medicine   COPD -Stable   Tobacco abuse -Cessation counseling -Nicotine patch daily    Sinus tachycardia -Resolved   PT/OT=SNF/LTC   DVT prophylaxis: SCDs Start: 05/24/21 2000 Code Status: DNR Family Communication:    Status is: Inpatient  Remains inpatient appropriate because:Unsafe d/c plan   Dispo: The patient is from: Home              Anticipated d/c is to: LTAC              Patient currently is medically stable to d/c.   Difficult to place patient Yes       Nutritional status  Nutrition Problem: Severe Malnutrition Etiology: chronic  illness  Signs/Symptoms: severe fat depletion, severe muscle depletion  Interventions: Ensure Enlive (each supplement provides 350kcal and 20 grams of protein), MVI, Refer to RD note for recommendations, Magic cup  Body mass index is 16.83 kg/m.    Subjective: Doing ok, no acute events overnight.   Review of Systems Otherwise negative except as per HPI, including: General = no fevers, chills, dizziness,  fatigue HEENT/EYES = negative for loss of vision, double vision, blurred vision,  sore throa Cardiovascular= negative for chest pain, palpitation Respiratory/lungs= negative for shortness of breath, cough, wheezing; hemoptysis,  Gastrointestinal= negative for nausea, vomiting, abdominal pain Genitourinary= negative for Dysuria MSK = Negative for arthralgia, myalgias Neurology= Negative for headache, numbness, tingling  Psychiatry= Negative for suicidal and homocidal ideation Skin= Negative for Rash   Examination:  Constitutional: Not in acute distress.  Respiratory: Clear to auscultation bilaterally Cardiovascular: Normal sinus rhythm, no rubs Abdomen: Nontender nondistended good bowel sounds Musculoskeletal: No edema noted Skin: No rashes seen Neurologic: CN 2-12 grossly intact.  And nonfocal Psychiatric: Normal judgment and insight. Alert and oriented x 3. Normal mood.   Trach collar in place.    Objective: Vitals:   06/05/21 2019 06/05/21 2025 06/05/21 2357 06/06/21 0404  BP:      Pulse:  86 87 77  Resp:  18 18 18   Temp:      TempSrc:      SpO2: 98% 97% 97% 98%  Weight:      Height:        Intake/Output Summary (Last 24 hours) at 06/06/2021 4709  Last data filed at 06/06/2021 9518 Gross per 24 hour  Intake --  Output 1000 ml  Net -1000 ml   Filed Weights   05/29/21 0555 06/03/21 0500 06/04/21 0526  Weight: 51.4 kg 49.9 kg 47.3 kg     Data Reviewed:   CBC: No results for input(s): WBC, NEUTROABS, HGB, HCT, MCV, PLT in the last 168 hours. Basic  Metabolic Panel: Recent Labs  Lab 05/31/21 0217 06/01/21 0211 06/02/21 0220 06/03/21 0435 06/05/21 0421 06/06/21 0055  NA 135 136 135  --  135 134*  K 4.1 4.2 3.8  --  4.0 3.9  CL 98 93* 96*  --  91* 95*  CO2 29 30 30   --  34* 32  GLUCOSE 142* 127* 127*  --  93 133*  BUN 11 13 11   --  14 11  CREATININE 0.52* 0.52* 0.54*  --  0.51* 0.46*  CALCIUM 8.6* 9.0 8.7*  --  9.2 8.6*  MG 1.6* 1.6* 1.6* 1.9 1.7 1.7   GFR: Estimated Creatinine Clearance: 66.5 mL/min (A) (by C-G formula based on SCr of 0.46 mg/dL (L)). Liver Function Tests: No results for input(s): AST, ALT, ALKPHOS, BILITOT, PROT, ALBUMIN in the last 168 hours. No results for input(s): LIPASE, AMYLASE in the last 168 hours. No results for input(s): AMMONIA in the last 168 hours. Coagulation Profile: No results for input(s): INR, PROTIME in the last 168 hours. Cardiac Enzymes: No results for input(s): CKTOTAL, CKMB, CKMBINDEX, TROPONINI in the last 168 hours. BNP (last 3 results) No results for input(s): PROBNP in the last 8760 hours. HbA1C: No results for input(s): HGBA1C in the last 72 hours. CBG: No results for input(s): GLUCAP in the last 168 hours. Lipid Profile: No results for input(s): CHOL, HDL, LDLCALC, TRIG, CHOLHDL, LDLDIRECT in the last 72 hours. Thyroid Function Tests: No results for input(s): TSH, T4TOTAL, FREET4, T3FREE, THYROIDAB in the last 72 hours. Anemia Panel: No results for input(s): VITAMINB12, FOLATE, FERRITIN, TIBC, IRON, RETICCTPCT in the last 72 hours. Sepsis Labs: No results for input(s): PROCALCITON, LATICACIDVEN in the last 168 hours.  No results found for this or any previous visit (from the past 240 hour(s)).       Radiology Studies: No results found.      Scheduled Meds:  dexamethasone  2 mg Oral Q breakfast   Followed by   Derrill Memo ON 06/08/2021] dexamethasone  1 mg Oral Q breakfast   feeding supplement  237 mL Oral BID BM   mometasone-formoterol  2 puff Inhalation BID    multivitamin with minerals  1 tablet Oral Daily   pantoprazole  40 mg Oral Daily   polyethylene glycol  17 g Oral Daily   Continuous Infusions:   LOS: 13 days   Time spent= 35 mins    Dyasia Firestine Arsenio Loader, MD [facility]  If 7PM-7AM, please contact night-coverage  06/06/2021, 7:38 AM

## 2021-06-06 NOTE — Progress Notes (Addendum)
Physical Therapy Treatment Patient Details Name: Perry Waller MRN: [id number] DOB: [birth year] Today's Date: 06/06/2021    History of Present Illness 60 y.o. male presenting on 5/27 from ENT clinic with tracheostomy obstruction. Patient transferred to [facility] for tracheostomy revision. 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    PT Comments    Patient frustrated with nursing on arrival and his frustration carried over into PT session. He continues to need education on safe use of rollator during transfers and his use during ambulation was improved. Continues to benefit from PT and currently overall requires minguard assist. Noted pt from home with sister and has progressed well, ?home with HHPT now an option.     Follow Up Recommendations  SNF (vs LTC vs home with family with HHPT)      Equipment Recommendations  Other (comment) (rollator)    Recommendations for Other Services       Precautions / Restrictions Precautions Precautions: Fall    Mobility  Bed Mobility Overal bed mobility: Modified Independent Bed Mobility: Supine to Sit     Supine to sit: Modified independent (Device/Increase time)          Transfers Overall transfer level: Needs assistance Equipment used: 4-wheeled walker Transfers: Sit to/from Stand Sit to Stand: Min guard         General transfer comment: Min guard for safety as pt tries to pull up on rollator and it slides. Stated he wants to do it his way and due to his frustration level, did not push further reps/practice  Ambulation/Gait Ambulation/Gait assistance: Min guard Gait Distance (Feet): 180 Feet Assistive device: 4-wheeled walker Gait Pattern/deviations: Step-through pattern;Decreased stride length Gait velocity: improved pace and control of rollator   General Gait Details: Assist for safety as he pushes rollator too far ahead  and has tendency to drift rt and left. No overt loss of balance using rollator   Stairs             Wheelchair Mobility    Modified Rankin (Stroke Patients Only)       Balance Overall balance assessment: Needs assistance Sitting-balance support: No upper extremity supported;Feet supported Sitting balance-Leahy Scale: Fair     Standing balance support: During functional activity;No upper extremity supported Standing balance-Leahy Scale: Fair Standing balance comment: can maintain static stance without UE's, but needing AD or external support dynamically.                            Cognition Arousal/Alertness: Awake/alert Behavior During Therapy: Anxious (frustrated) Overall Cognitive Status: No family/caregiver present to determine baseline cognitive functioning                                 General Comments: better velocity and control of rollator (better awareness of deficits); frustrated by pain in upper arm and feeling his nurse has not attended to his needs (although pre-medicated and given ice pack prior to PT arrival)      Exercises      General Comments General comments (skin integrity, edema, etc.): pt more easily frustrated this date and reporting he was too cold from walking he needed warm blankets before doing further PT; on return with warm blankets, NT prepping to do bath with pt and pt eager to do this.      Pertinent  Vitals/Pain Pain Assessment: Faces Faces Pain Scale: Hurts little more Pain Location: rt upper, inner arm Pain Descriptors / Indicators: Discomfort;Sore Pain Intervention(s): Limited activity within patient's tolerance;Premedicated before session;Repositioned;Ice applied (pt had ice on arrival and replaced at end of session)    Home Living                      Prior Function            PT Goals (current goals can now be found in the care plan section) Acute Rehab PT Goals Patient Stated Goal:  "To get up outta here" Time For Goal Achievement: 06/10/21 Potential to Achieve Goals: Good Progress towards PT goals: Progressing toward goals    Frequency    Min 2X/week      PT Plan Current plan remains appropriate;Frequency needs to be updated (anticipate he will need LTC ultimately)    Co-evaluation              AM-PAC PT "6 Clicks" Mobility   Outcome Measure  Help needed turning from your back to your side while in a flat bed without using bedrails?: None Help needed moving from lying on your back to sitting on the side of a flat bed without using bedrails?: None Help needed moving to and from a bed to a chair (including a wheelchair)?: A Little Help needed standing up from a chair using your arms (e.g., wheelchair or bedside chair)?: A Little Help needed to walk in hospital room?: A Little Help needed climbing 3-5 steps with a railing? : A Little 6 Click Score: 20    End of Session Equipment Utilized During Treatment: Gait belt (although pt would not let PT hold it) Activity Tolerance: Patient tolerated treatment well (sats 98%, max HR 117) Patient left: with call bell/phone within reach;in chair;with chair alarm set Nurse Communication: Mobility status PT Visit Diagnosis: Unsteadiness on feet (R26.81);Muscle weakness (generalized) (M62.81);Difficulty in walking, not elsewhere classified (R26.2)     Time: 1025-1050 PT Time Calculation (min) (ACUTE ONLY): 25 min  Charges:  $Gait Training: 23-37 mins                      Arby Barrette, PT Pager [phone]    Rexanne Mano 06/06/2021, 11:12 AM

## 2021-06-07 NOTE — Plan of Care (Signed)

## 2021-06-07 NOTE — Progress Notes (Signed)
PROGRESS NOTE    Perry Waller  TGG:[id number] DOB: 26-Sep-1961 DOA: 05/24/2021 PCP: Pcp, No   Brief Narrative:  60 year old African-American male with past medical history significant for stage IV laryngeal cancer status postchemotherapy and radiation therapy, s/p tracheostomy in August 2020, COPD, chronic tobacco use, severe PEM and chronic anemia.  Patient was admitted from the ENTs office with concerns for tracheostomy obstruction and plans for tracheostomy revision procedure.  Patient has undergone revision of the tracheostomy on 5/28.  ENT team is managing patient's tracheostomy tube postop.  Patient is medically stable for discharge, awaiting SNF placement with trach.    Assessment & Plan:   Principal Problem:   Tracheostomy obstruction (HCC) Active Problems:   Tobacco abuse   Protein-calorie malnutrition, severe   Tracheal stenosis following tracheostomy (HCC)   Dehydration   Prerenal azotemia   COPD (chronic obstructive pulmonary disease) (HCC)   Chronic anemia    Tracheostomy obstruction -Status post tracheostomy revision by Dr. Fredric Dine ENT on 5/28. Please keep obturator taped to [place] changed to [place] size 4 cuffless without difficulty on 6/2 -Follow up with Dr. Constance Holster in 6 months, follow up with trach clinic after SNF discharge    Severe protein calorie malnutrition -Appreciate dietitian, palliative care medicine   COPD -Stable   Tobacco abuse -Cessation counseling -Nicotine patch daily    Sinus tachycardia -Resolved   PT/OT=SNF/LTC   DVT prophylaxis: SCDs Start: 05/24/21 2000 Code Status: DNR Family Communication:  Called Henriette, sister [phone]. No answer. VM full therefore unable to leave one.   Status is: Inpatient  Remains inpatient appropriate because:Unsafe d/c plan   Dispo: The patient is from: Home              Anticipated d/c is to: LTAC              Patient currently is medically stable to d/c. Currently has unsafe plans. Family  wishes for him to go to a facility, but difficulty finding one. TOC following.    Difficult to place patient Yes   Nutritional status  Nutrition Problem: Severe Malnutrition Etiology: chronic illness  Signs/Symptoms: severe fat depletion, severe muscle depletion  Interventions: Ensure Enlive (each supplement provides 350kcal and 20 grams of protein), MVI, Refer to RD note for recommendations, Magic cup  Body mass index is 16.83 kg/m.    Subjective: No complaints, feels ok.    Examination: Constitutional: Not in acute distress Respiratory: Clear to auscultation bilaterally Cardiovascular: Normal sinus rhythm, no rubs Abdomen: Nontender nondistended good bowel sounds Musculoskeletal: No edema noted Skin: No rashes seen Neurologic: CN 2-12 grossly intact.  And nonfocal Psychiatric: Normal judgment and insight. Alert and oriented x 3. Normal mood.    Trach collar in place.    Objective: Vitals:   06/06/21 1540 06/06/21 2007 06/06/21 2346 06/07/21 0325  BP:   114/83   Pulse: 90 94 86 87  Resp: 16 18 20 18   Temp:   97.8 F (36.6 C)   TempSrc:   Axillary   SpO2: 100% 100% 100% 100%  Weight:      Height:        Intake/Output Summary (Last 24 hours) at 06/07/2021 0739 Last data filed at 06/07/2021 0701 Gross per 24 hour  Intake --  Output 1600 ml  Net -1600 ml   Filed Weights   05/29/21 0555 06/03/21 0500 06/04/21 0526  Weight: 51.4 kg 49.9 kg 47.3 kg     Data Reviewed:   CBC: No results for  input(s): WBC, NEUTROABS, HGB, HCT, MCV, PLT in the last 168 hours. Basic Metabolic Panel: Recent Labs  Lab 06/01/21 0211 06/02/21 0220 06/03/21 0435 06/05/21 0421 06/06/21 0055  NA 136 135  --  135 134*  K 4.2 3.8  --  4.0 3.9  CL 93* 96*  --  91* 95*  CO2 30 30  --  34* 32  GLUCOSE 127* 127*  --  93 133*  BUN 13 11  --  14 11  CREATININE 0.52* 0.54*  --  0.51* 0.46*  CALCIUM 9.0 8.7*  --  9.2 8.6*  MG 1.6* 1.6* 1.9 1.7 1.7   GFR: Estimated Creatinine  Clearance: 66.5 mL/min (A) (by C-G formula based on SCr of 0.46 mg/dL (L)). Liver Function Tests: No results for input(s): AST, ALT, ALKPHOS, BILITOT, PROT, ALBUMIN in the last 168 hours. No results for input(s): LIPASE, AMYLASE in the last 168 hours. No results for input(s): AMMONIA in the last 168 hours. Coagulation Profile: No results for input(s): INR, PROTIME in the last 168 hours. Cardiac Enzymes: No results for input(s): CKTOTAL, CKMB, CKMBINDEX, TROPONINI in the last 168 hours. BNP (last 3 results) No results for input(s): PROBNP in the last 8760 hours. HbA1C: No results for input(s): HGBA1C in the last 72 hours. CBG: No results for input(s): GLUCAP in the last 168 hours. Lipid Profile: No results for input(s): CHOL, HDL, LDLCALC, TRIG, CHOLHDL, LDLDIRECT in the last 72 hours. Thyroid Function Tests: No results for input(s): TSH, T4TOTAL, FREET4, T3FREE, THYROIDAB in the last 72 hours. Anemia Panel: No results for input(s): VITAMINB12, FOLATE, FERRITIN, TIBC, IRON, RETICCTPCT in the last 72 hours. Sepsis Labs: No results for input(s): PROCALCITON, LATICACIDVEN in the last 168 hours.  No results found for this or any previous visit (from the past 240 hour(s)).       Radiology Studies: No results found.      Scheduled Meds:  dexamethasone  2 mg Oral Q breakfast   Followed by   Derrill Memo ON 06/08/2021] dexamethasone  1 mg Oral Q breakfast   feeding supplement  237 mL Oral BID BM   mometasone-formoterol  2 puff Inhalation BID   multivitamin with minerals  1 tablet Oral Daily   pantoprazole  40 mg Oral Daily   polyethylene glycol  17 g Oral Daily   Continuous Infusions:   LOS: 14 days   Time spent= 35 mins    Kristiann Noyce Arsenio Loader, MD [facility]  If 7PM-7AM, please contact night-coverage  06/07/2021, 7:39 AM

## 2021-06-07 NOTE — Progress Notes (Signed)
SLP Cancellation Note  Patient Details Name: Perry Waller MRN: [id number] DOB: Nov 08, 1961   Cancelled treatment:       Reason Eval/Treat Not Completed: Patient at procedure or test/unavailable (Pt currently working with NT. SLP will follow up on a subsequent date.)  Jaxin Fulfer I. Hardin Negus, [place], [place] Office number [phone] Pager [place] 06/07/2021, 4:21 PM

## 2021-06-07 NOTE — Progress Notes (Signed)
Nutrition Follow-up  DOCUMENTATION CODES:   Severe malnutrition in context of chronic illness, Underweight  INTERVENTION:  -Continue Magic cup TID with meals, each supplement provides 290 kcal and 9 grams of protein -Continue Ensure Enlive po BID, each supplement provides 350 kcal and 20 grams of protein -- thickened to HONEY consistency -Continue MVI with minerals daily  NUTRITION DIAGNOSIS:   Severe Malnutrition related to chronic illness as evidenced by severe fat depletion, severe muscle depletion. - ongoing  GOAL:   Patient will meet greater than or equal to 90% of their needs - progressing   MONITOR:   PO intake, Supplement acceptance, Labs, Weight trends  REASON FOR ASSESSMENT:   Consult Assessment of nutrition requirement/status  ASSESSMENT:   60 yo male admitted with trach obstruction. PMH includes stage IV laryngeal cancer s/p chemo and radiation therapy, s/p trach in August 2020, COPD  05/28 s/p trach revision 05/30 diet upgraded to regular with honey thick liquids 06/02 trach changed to shiley size 4 cuffless   Pt without complaints today. Continues to be awaiting discharge to SNF. Meal completions charted as 25-100% x 8 recorded meals (65% average meal intake). Per RN, pt still doing well with supplement consumption. Recommend continue current nutrition plan of care.  UOP: 1643ml x24 hours Stool: 1x unmeasured occurrence x24 hours  Medications:  dexamethasone  2 mg Oral Q breakfast   Followed by   Derrill Memo ON 06/08/2021] dexamethasone  1 mg Oral Q breakfast   feeding supplement  237 mL Oral BID BM   mometasone-formoterol  2 puff Inhalation BID   multivitamin with minerals  1 tablet Oral Daily   pantoprazole  40 mg Oral Daily   polyethylene glycol  17 g Oral Daily   Labs: Recent Labs  Lab 06/02/21 0220 06/03/21 0435 06/05/21 0421 06/06/21 0055  NA 135  --  135 134*  K 3.8  --  4.0 3.9  CL 96*  --  91* 95*  CO2 30  --  34* 32  BUN 11  --  14 11   CREATININE 0.54*  --  0.51* 0.46*  CALCIUM 8.7*  --  9.2 8.6*  MG 1.6* 1.9 1.7 1.7  GLUCOSE 127*  --  93 133*   Diet Order:   Diet Order             Diet regular Room service appropriate? Yes; Fluid consistency: Honey Thick  Diet effective now                   EDUCATION NEEDS:   Education needs have been addressed  Skin:  Skin Assessment: Reviewed RN Assessment  Last BM:  6/7 type 2  Height:   Ht Readings from Last 1 Encounters:  05/25/21 5\' 6"  (1.676 m)    Weight:   Wt Readings from Last 1 Encounters:  06/04/21 47.3 kg    BMI:  Body mass index is 16.83 kg/m.  Estimated Nutritional Needs:   Kcal:  1650-1850 kcals  Protein:  80-90 g  Fluid:  >/= 1.7 L    Larkin Ina, MS, RD, LDN RD pager number and weekend/on-call pager number located in [place].

## 2021-06-07 NOTE — TOC Progression Note (Addendum)
Transition of Care [facility]) - Progression Note    Patient Details  Name: Perry Waller MRN: [id number] Date of Birth: May 22, 1961  [facility] CM/SW Contact  Joanne Chars, LCSW Phone Number: 06/07/2021, 10:42 AM  Clinical Narrative:   CSW attempted to call pt sister at 0830, 1030.  No answer, VM still full.  CSW spoke with Stacie at [facility] regarding this referral-she is unable to accept.  1515: CSW spoke with [place] at [facility].  She is unable to accept any medicaid currently, contracts are not in place with medicaid at this time.    CSW spoke with pt nephew  Jurrell (listed as Durell on face sheet)  He is aware that pt in hospital and is in contact with pt sister.  He will reach out to her and see if he can get her to contact CSW.   87: CSW LM with [facility] asking for update on possible admit to [facility] [place] does not have beds at any location currently but is willing to consider pt once beds become available. Melissa at [facility] is able to offer pt SNF bed--she said pt could potentially come Monday pending medicaid asset search.        Expected Discharge Plan: Skilled Nursing Facility Barriers to Discharge: Continued Medical Work up, SNF Pending bed offer  Expected Discharge Plan and Services Expected Discharge Plan: [place] In-house Referral: Clinical Social Work Discharge Planning Services: CM Consult                                           Social Determinants of Health (SDOH) Interventions    Readmission Risk Interventions No flowsheet data found.

## 2021-06-07 NOTE — Progress Notes (Signed)
Occupational Therapy Treatment Patient Details Name: Perry Waller MRN: [id number] DOB: 1961-05-17 Today's Date: 06/07/2021    History of present illness 60 y.o. male presenting on 5/27 from ENT clinic with tracheostomy obstruction. Patient transferred to [facility] for tracheostomy revision. 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   OT comments  Pt progressing well towards acute OT goals. Supervision for toilet transfers to [facility] and pericare in standing this session. Mod I with bed mobility. Goals to be updated next session and possibly d/c from OT services then. D/c plan to a venue with supervision remains appropriate from OT standpoint.    Follow Up Recommendations  SNF;Supervision/Assistance - 24 hour    Equipment Recommendations  Other (comment) (TBD)    Recommendations for Other Services      Precautions / Restrictions Precautions Precautions: Fall Restrictions Weight Bearing Restrictions: No       Mobility Bed Mobility Overal bed mobility: Modified Independent                  Transfers Overall transfer level: Needs assistance Equipment used: None Transfers: Sit to/from Stand Sit to Stand: Supervision         General transfer comment: from [facility] to EOB. supervision for safety and line management.    Balance Overall balance assessment: Needs assistance Sitting-balance support: No upper extremity supported;Feet supported Sitting balance-Leahy Scale: Fair     Standing balance support: During functional activity;No upper extremity supported Standing balance-Leahy Scale: Fair Standing balance comment: pericare in standing with no external support though extra weight shifts noted                           ADL either performed or assessed with clinical judgement   ADL Overall ADL's : Needs assistance/impaired                          Toilet Transfer: Supervision/safety;Stand-pivot;Ambulation Toilet Transfer Details (indicate cue type and reason): pivotal steps to access [facility] Toileting- Clothing Manipulation and Hygiene: Set up;Supervision/safety;Sit to/from stand         General ADL Comments: Focus of session was toilet transfer and pericare.     Vision       Perception     Praxis      Cognition Arousal/Alertness: Awake/alert Behavior During Therapy: Agitated (Very irritated/frustrated demeanor and speech) Overall Cognitive Status: No family/caregiver present to determine baseline cognitive functioning                                          Exercises     Shoulder Instructions       General Comments Attempted follow up on theraband HEP. Pt declined to comment.    Pertinent Vitals/ Pain       Pain Assessment: Faces Faces Pain Scale: Hurts little more Pain Location: did not specify Pain Descriptors / Indicators: Grimacing Pain Intervention(s): Monitored during session  Home Living                                          Prior Functioning/Environment              Frequency  Min  2X/week        Progress Toward Goals  OT Goals(current goals can now be found in the care plan section)  Progress towards OT goals: Progressing toward goals  Acute Rehab OT Goals Patient Stated Goal: "To get up outta here" OT Goal Formulation: With patient Time For Goal Achievement: 06/10/21 Potential to Achieve Goals: Good ADL Goals Pt Will Perform Grooming: with supervision;standing Pt Will Perform Upper Body Dressing: sitting;with set-up Pt Will Perform Lower Body Dressing: with supervision;sit to/from stand Pt Will Transfer to Toilet: with supervision;ambulating Pt Will Perform Toileting - Clothing Manipulation and hygiene: with supervision;sit to/from stand Pt/caregiver will Perform Home Exercise Program: Both right and left upper extremity;With written HEP  provided  Plan Discharge plan remains appropriate;Frequency remains appropriate    Co-evaluation                 AM-PAC OT "6 Clicks" Daily Activity     Outcome Measure   Help from another person eating meals?: A Little Help from another person taking care of personal grooming?: A Little Help from another person toileting, which includes using toliet, bedpan, or urinal?: A Little Help from another person bathing (including washing, rinsing, drying)?: A Little Help from another person to put on and taking off regular upper body clothing?: A Little Help from another person to put on and taking off regular lower body clothing?: A Little 6 Click Score: 18    End of Session Equipment Utilized During Treatment: Other (comment);Oxygen  OT Visit Diagnosis: Unsteadiness on feet (R26.81);Muscle weakness (generalized) (M62.81)   Activity Tolerance Patient tolerated treatment well;Treatment limited secondary to agitation   Patient Left in bed;with call bell/phone within reach   Nurse Communication Other (comment) (NT present during session)        Time: 1308-6578 OT Time Calculation (min): 9 min  Charges: OT General Charges $OT Visit: 1 Visit OT Treatments $Self Care/Home Management : 8-22 mins  Tyrone Schimke, OT Acute Rehabilitation Services Pager: [phone] Office: [phone]    Hortencia Pilar 06/07/2021, 1:16 PM

## 2021-06-07 NOTE — TOC Progression Note (Signed)
Transition of Care [facility]) - Progression Note    Patient Details  Name: Perry Waller MRN: [id number] Date of Birth: Jul 01, 1961  Transition of Care [facility]) CM/SW Contact  Joanne Chars, LCSW Phone Number: 06/07/2021, 8:35 AM  Clinical Narrative:   CSW spoke with Lynn/PT regarding recommendations for SNF and she indicates that pt has made progress to the point that potential discharge home with [facility] would be possible.  CSW spoke with pt who would be open to return home.  Pt reports he has last spoken to his sister a few days ago, she is difficult to reach at work but is supposed to be off work Architectural technologist.    CSW attempted to call sister, no answer, unable to leave message as mailbox was full. CSW did reach out by text asking sister to call.      Expected Discharge Plan: Skilled Nursing Facility Barriers to Discharge: Continued Medical Work up, SNF Pending bed offer  Expected Discharge Plan and Services Expected Discharge Plan: [place] In-house Referral: Clinical Social Work Discharge Planning Services: CM Consult                                           Social Determinants of Health (SDOH) Interventions    Readmission Risk Interventions No flowsheet data found.

## 2021-06-08 NOTE — Progress Notes (Signed)
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  Patient was admitted from the ENTs office with concerns for tracheostomy obstruction and plans for tracheostomy revision procedure.  Patient has undergone revision of the tracheostomy on 5/28.  ENT team is managing patient's tracheostomy tube postop.  Patient is medically stable for discharge, awaiting SNF placement with trach.    Assessment & Plan:   Principal Problem:   Tracheostomy obstruction (HCC) Active Problems:   Tobacco abuse   Protein-calorie malnutrition, severe   Tracheal stenosis following tracheostomy (HCC)   Dehydration   Prerenal azotemia   COPD (chronic obstructive pulmonary disease) (HCC)   Chronic anemia    Tracheostomy obstruction -Status post tracheostomy revision by Dr. Fredric Dine ENT on 5/28. Please keep obturator taped to [place] changed to [place]iley size 4 cuffless without difficulty on 6/2 -Follow up with Dr. Constance Holster in 6 months, follow up with trach clinic after SNF discharge    Severe protein calorie malnutrition -Appreciate dietitian, palliative care medicine   COPD -Stable   Tobacco abuse -Cessation counseling -Nicotine patch daily    Sinus tachycardia -Resolved   PT/OT=SNF/LTC   DVT prophylaxis: SCDs Start: 05/24/21 2000 Code Status: DNR Family Communication: Spoke his sister over the phone while I was in the room this morning.  She is concerned about her safety at home.  Status is: Inpatient  Remains inpatient appropriate because:Unsafe d/c plan   Dispo: The patient is from: Home              Anticipated d/c is to: LTAC              Patient currently is medically stable to d/c. Currently has  unsafe plans. Family wishes for him to go to a facility, but difficulty finding one. TOC following.    Difficult to place patient Yes   Nutritional status  Nutrition Problem: Severe Malnutrition Etiology: chronic illness  Signs/Symptoms: severe fat depletion, severe muscle depletion  Interventions: Ensure Enlive (each supplement provides 350kcal and 20 grams of protein), MVI, Refer to RD note for recommendations, Magic cup  Body mass index is 16.83 kg/m.    Subjective: Patient does not have any complaints, wants to be discharged.  I spoke with the patient's sister over the phone while I was in the room.  She is very much concerned about his safety at home due to lack of supervision during the day.  I have advised her to speak with our social worker team so we can help him the best.   Examination: Constitutional: Not in acute distress Respiratory: Clear to auscultation bilaterally Cardiovascular: Normal sinus rhythm, no rubs Abdomen: Nontender nondistended good bowel sounds Musculoskeletal: No edema noted Skin: No rashes seen Neurologic: CN 2-12 grossly intact.  And nonfocal Psychiatric: Normal judgment and insight. Alert and oriented x 3. Normal mood.  Trach collar in place.    Objective: Vitals:   06/08/21 0100 06/08/21 0400 06/08/21 0523 06/08/21 0747  BP:   113/82   Pulse: 87 88 100 92  Resp: 20 20 20 20   Temp:   98.8 F (37.1 C)   TempSrc:   Oral   SpO2: 97% 98% 100% 100%  Weight:      Height:  No intake or output data in the 24 hours ending 06/08/21 1005  Filed Weights   05/29/21 0555 06/03/21 0500 06/04/21 0526  Weight: 51.4 kg 49.9 kg 47.3 kg     Data Reviewed:   CBC: No results for input(s): WBC, NEUTROABS, HGB, HCT, MCV, PLT in the last 168 hours. Basic Metabolic Panel: Recent Labs  Lab 06/02/21 0220 06/03/21 0435 06/05/21 0421 06/06/21 0055  NA 135  --  135 134*  K 3.8  --  4.0 3.9  CL 96*  --  91* 95*  CO2 30  --  34* 32  GLUCOSE  127*  --  93 133*  BUN 11  --  14 11  CREATININE 0.54*  --  0.51* 0.46*  CALCIUM 8.7*  --  9.2 8.6*  MG 1.6* 1.9 1.7 1.7   GFR: Estimated Creatinine Clearance: 66.5 mL/min (A) (by C-G formula based on SCr of 0.46 mg/dL (L)). Liver Function Tests: No results for input(s): AST, ALT, ALKPHOS, BILITOT, PROT, ALBUMIN in the last 168 hours. No results for input(s): LIPASE, AMYLASE in the last 168 hours. No results for input(s): AMMONIA in the last 168 hours. Coagulation Profile: No results for input(s): INR, PROTIME in the last 168 hours. Cardiac Enzymes: No results for input(s): CKTOTAL, CKMB, CKMBINDEX, TROPONINI in the last 168 hours. BNP (last 3 results) No results for input(s): PROBNP in the last 8760 hours. HbA1C: No results for input(s): HGBA1C in the last 72 hours. CBG: No results for input(s): GLUCAP in the last 168 hours. Lipid Profile: No results for input(s): CHOL, HDL, LDLCALC, TRIG, CHOLHDL, LDLDIRECT in the last 72 hours. Thyroid Function Tests: No results for input(s): TSH, T4TOTAL, FREET4, T3FREE, THYROIDAB in the last 72 hours. Anemia Panel: No results for input(s): VITAMINB12, FOLATE, FERRITIN, TIBC, IRON, RETICCTPCT in the last 72 hours. Sepsis Labs: No results for input(s): PROCALCITON, LATICACIDVEN in the last 168 hours.  No results found for this or any previous visit (from the past 240 hour(s)).       Radiology Studies: No results found.      Scheduled Meds:  dexamethasone  1 mg Oral Q breakfast   feeding supplement  237 mL Oral BID BM   mometasone-formoterol  2 puff Inhalation BID   multivitamin with minerals  1 tablet Oral Daily   pantoprazole  40 mg Oral Daily   polyethylene glycol  17 g Oral Daily   Continuous Infusions:   LOS: 15 days   Time spent= 35 mins    Dayle Sherpa Arsenio Loader, MD [facility]  If 7PM-7AM, please contact night-coverage  06/08/2021, 10:05 AM

## 2021-06-09 ENCOUNTER — Inpatient Hospital Stay (HOSPITAL_COMMUNITY): Payer: Medicaid Other

## 2021-06-09 DIAGNOSIS — A419 Sepsis, unspecified organism: Secondary | ICD-10-CM

## 2021-06-09 LAB — URINALYSIS, ROUTINE W REFLEX MICROSCOPIC
Bilirubin Urine: NEGATIVE
Glucose, UA: NEGATIVE mg/dL
Hgb urine dipstick: NEGATIVE
Ketones, ur: NEGATIVE mg/dL
Nitrite: NEGATIVE
Protein, ur: 30 mg/dL — AB
Specific Gravity, Urine: 1.018 (ref 1.005–1.030)
WBC, UA: >50 WBC/hpf — ABNORMAL HIGH (ref 0–5)
pH: 9 — ABNORMAL HIGH (ref 5.0–8.0)

## 2021-06-09 LAB — COMPREHENSIVE METABOLIC PANEL
ALT: 19 U/L (ref 0–44)
AST: 18 U/L (ref 15–41)
Albumin: 2.5 g/dL — ABNORMAL LOW (ref 3.5–5.0)
Alkaline Phosphatase: 56 U/L (ref 38–126)
Anion gap: 11 (ref 5–15)
BUN: 15 mg/dL (ref 6–20)
CO2: 29 mmol/L (ref 22–32)
Calcium: 8.4 mg/dL — ABNORMAL LOW (ref 8.9–10.3)
Chloride: 96 mmol/L — ABNORMAL LOW (ref 98–111)
Creatinine, Ser: 0.52 mg/dL — ABNORMAL LOW (ref 0.61–1.24)
GFR, Estimated: >60 mL/min (ref >60)
Glucose, Bld: 108 mg/dL — ABNORMAL HIGH (ref 70–99)
Potassium: 3.9 mmol/L (ref 3.5–5.1)
Sodium: 136 mmol/L (ref 135–145)
Total Bilirubin: 0.7 mg/dL (ref 0.3–1.2)
Total Protein: 6 g/dL — ABNORMAL LOW (ref 6.5–8.1)

## 2021-06-09 LAB — CBC WITH DIFFERENTIAL/PLATELET
Abs Immature Granulocytes: 0.15 10*3/uL — ABNORMAL HIGH (ref 0.00–0.07)
Basophils Absolute: 0 10*3/uL (ref 0.0–0.1)
Basophils Relative: 1 %
Eosinophils Absolute: 0.1 10*3/uL (ref 0.0–0.5)
Eosinophils Relative: 1 %
HCT: 28.3 % — ABNORMAL LOW (ref 39.0–52.0)
Hemoglobin: 8.7 g/dL — ABNORMAL LOW (ref 13.0–17.0)
Immature Granulocytes: 2 %
Lymphocytes Relative: 11 %
Lymphs Abs: 1 10*3/uL (ref 0.7–4.0)
MCH: 29.3 pg (ref 26.0–34.0)
MCHC: 30.7 g/dL (ref 30.0–36.0)
MCV: 95.3 fL (ref 80.0–100.0)
Monocytes Absolute: 0.9 10*3/uL (ref 0.1–1.0)
Monocytes Relative: 10 %
Neutro Abs: 6.6 10*3/uL (ref 1.7–7.7)
Neutrophils Relative %: 75 %
Platelets: 331 10*3/uL (ref 150–400)
RBC: 2.97 MIL/uL — ABNORMAL LOW (ref 4.22–5.81)
RDW: 17.4 % — ABNORMAL HIGH (ref 11.5–15.5)
WBC: 8.7 10*3/uL (ref 4.0–10.5)
nRBC: 0.2 % (ref 0.0–0.2)

## 2021-06-09 LAB — LACTIC ACID, PLASMA
Lactic Acid, Venous: 1.8 mmol/L (ref 0.5–1.9)
Lactic Acid, Venous: 1.9 mmol/L (ref 0.5–1.9)

## 2021-06-09 LAB — BASIC METABOLIC PANEL WITH GFR
Anion gap: 8 (ref 5–15)
BUN: 11 mg/dL (ref 6–20)
CO2: 29 mmol/L (ref 22–32)
Calcium: 8.7 mg/dL — ABNORMAL LOW (ref 8.9–10.3)
Chloride: 97 mmol/L — ABNORMAL LOW (ref 98–111)
Creatinine, Ser: 0.56 mg/dL — ABNORMAL LOW (ref 0.61–1.24)
GFR, Estimated: >60 mL/min (ref >60)
Glucose, Bld: 156 mg/dL — ABNORMAL HIGH (ref 70–99)
Potassium: 3.4 mmol/L — ABNORMAL LOW (ref 3.5–5.1)
Sodium: 134 mmol/L — ABNORMAL LOW (ref 135–145)

## 2021-06-09 LAB — PROCALCITONIN: Procalcitonin: <0.1 ng/mL

## 2021-06-09 LAB — MAGNESIUM: Magnesium: 1.5 mg/dL — ABNORMAL LOW (ref 1.7–2.4)

## 2021-06-09 LAB — APTT: aPTT: 30 seconds (ref 24–36)

## 2021-06-09 LAB — PROTIME-INR
INR: 1 (ref 0.8–1.2)
Prothrombin Time: 13.4 seconds (ref 11.4–15.2)

## 2021-06-09 MED ORDER — MAGNESIUM SULFATE 2 GM/50ML IV SOLN
2.0000 g | Freq: Once | INTRAVENOUS | Status: AC
  Administered 2021-06-09: 2 g via INTRAVENOUS
  Filled 2021-06-09: qty 50

## 2021-06-09 MED ORDER — SODIUM CHLORIDE 0.9 % IV SOLN
2.0000 g | Freq: Once | INTRAVENOUS | Status: AC
  Administered 2021-06-09: 2 g via INTRAVENOUS
  Filled 2021-06-09: qty 2

## 2021-06-09 MED ORDER — SODIUM CHLORIDE 0.9 % IV SOLN
2.0000 g | Freq: Three times a day (TID) | INTRAVENOUS | Status: DC
  Administered 2021-06-09 – 2021-06-11 (×6): 2 g via INTRAVENOUS
  Filled 2021-06-09 (×9): qty 2

## 2021-06-09 MED ORDER — LACTATED RINGERS IV BOLUS (SEPSIS)
500.0000 mL | Freq: Once | INTRAVENOUS | Status: AC
  Administered 2021-06-09 (×2): 500 mL via INTRAVENOUS

## 2021-06-09 MED ORDER — LACTATED RINGERS IV SOLN: INTRAVENOUS | Status: DC

## 2021-06-09 MED ORDER — VANCOMYCIN HCL 1000 MG/200ML IV SOLN
1000.0000 mg | Freq: Once | INTRAVENOUS | Status: AC
  Administered 2021-06-09: 1000 mg via INTRAVENOUS
  Filled 2021-06-09: qty 200

## 2021-06-09 MED ORDER — VANCOMYCIN HCL 1000 MG/200ML IV SOLN
1000.0000 mg | INTRAVENOUS | Status: DC
  Administered 2021-06-10: 1000 mg via INTRAVENOUS
  Filled 2021-06-09: qty 200

## 2021-06-09 MED ORDER — METRONIDAZOLE 500 MG/100ML IV SOLN
500.0000 mg | Freq: Three times a day (TID) | INTRAVENOUS | Status: DC
  Administered 2021-06-09 – 2021-06-10 (×4): 500 mg via INTRAVENOUS
  Filled 2021-06-09 (×4): qty 100

## 2021-06-09 MED ORDER — LACTATED RINGERS IV BOLUS (SEPSIS)
1000.0000 mL | Freq: Once | INTRAVENOUS | Status: AC
  Administered 2021-06-09: 1000 mL via INTRAVENOUS

## 2021-06-09 NOTE — Progress Notes (Signed)
   06/09/21 0455  Assess: MEWS Score  Temp (!) 102.8 F (39.3 C)  BP 112/84  Pulse Rate (!) 125  Resp (!) 31  Level of Consciousness Alert  SpO2 95 %  Assess: MEWS Score  MEWS Temp 2  MEWS Systolic 0  MEWS Pulse 2  MEWS RR 2  MEWS LOC 0  MEWS Score 6  MEWS Score Color Red  Assess: if the MEWS score is Yellow or Red  Were vital signs taken at a resting state? Yes  Focused Assessment No change from prior assessment  Early Detection of Sepsis Score *See Row Information* Medium  MEWS guidelines implemented *See Row Information* Yes  Treat  MEWS Interventions Administered prn meds/treatments;Escalated (See documentation below)  Take Vital Signs  Increase Vital Sign Frequency  Red: Q 1hr X 4 then Q 4hr X 4, if remains red, continue Q 4hrs  Escalate  MEWS: Escalate Red: discuss with charge nurse/RN and provider, consider discussing with RRT  Notify: Charge Nurse/RN  Name of Charge Nurse/RN Notified Sherlene Rickel, RN myself  Notify: Provider  Provider Name/Title Marlowe Sax, MD  Date Provider Notified 06/09/21  Time Provider Notified [phone]  Notification Type Page  Notification Reason Change in status  Document  Progress note created (see row info) Yes  Pt MEWS score 6, pt in no distress, no change in mental status. Tylenol 650 given for fever. Marlowe Sax, MD notified. Will continue to monitor pt closely.  Rathore, MD initiated code sepsis, and new orders placed. See MAR. Pt remains stable, in no distress, no change in mental status, alert and oriented x 4.

## 2021-06-09 NOTE — Progress Notes (Signed)
Pharmacy Antibiotic Note  Perry Waller is a 60 y.o. male admitted on 05/24/2021 with sepsis.  Pharmacy has been consulted for Vancomycin and Cefepime dosing.  Plan: Cefepime 2gm IV q8h Vancomycin 1000 mg IV Q 24 hrs. Goal AUC 400-550. Expected AUC: 550 SCr used: 0.9 Will f/u renal function, micro data, and pt's clinical condition Vanc levels prn   Height: 5\' 6"  (167.6 cm) Weight: 47.3 kg (104 lb 4.4 oz) IBW/kg (Calculated) : 63.8  Temp (24hrs), Avg:99.5 F (37.5 C), Min:97.5 F (36.4 C), Max:102.8 F (39.3 C)  Recent Labs  Lab 06/05/21 0421 06/06/21 0055 06/09/21 0003  CREATININE 0.51* 0.46* 0.56*    Estimated Creatinine Clearance: 66.5 mL/min (A) (by C-G formula based on SCr of 0.56 mg/dL (L)).    Allergies  Allergen Reactions   Fish Allergy Other (See Comments)    Breaks out    Antimicrobials this admission: 6/12 Vanc >>  6/12 Cefepime >>  6/12 Flagyl >>  Microbiology results:  BCx:   UCx:    Thank you for allowing pharmacy to be a part of this patient's care.  Sherlon Handing, PharmD, BCPS Please see amion for complete clinical pharmacist phone list 06/09/2021 5:19 AM

## 2021-06-09 NOTE — Progress Notes (Signed)
Pt being followed by [facility] for Sepsis protocol. 

## 2021-06-09 NOTE — Progress Notes (Addendum)
Floor coverage progress note  Notified by RN about vital signs this morning: Temperature 102.8 F, pulse 125, respiratory rate 31, blood pressure 112/84, SPO2 95%.  Code sepsis initiated.  Stat labs ordered including blood culture x2, CBC with differential, CMP, lactate, PT/INR, UA, urine culture.  Stat chest x-ray ordered.  Start cardiac monitoring.  Give 30 cc/kg fluid boluses.  Start broad-spectrum antibiotics including vancomycin, cefepime, and metronidazole.

## 2021-06-09 NOTE — Progress Notes (Signed)
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  Patient was admitted from the ENTs office with concerns for tracheostomy obstruction and plans for tracheostomy revision procedure.  Patient has undergone revision of the tracheostomy on 5/28.  ENT team is managing patient's tracheostomy tube postop.  Patient is medically stable for discharge, awaiting SNF placement with trach.  While awaiting placement developed sepsis secondary to UTI and pneumonia.   Assessment & Plan:   Principal Problem:   Tracheostomy obstruction (HCC) Active Problems:   Tobacco abuse   Protein-calorie malnutrition, severe   Tracheal stenosis following tracheostomy (HCC)   Dehydration   Prerenal azotemia   COPD (chronic obstructive pulmonary disease) (HCC)   Chronic anemia   Sepsis (HCC)     Sepsis secondary to aspiration pneumonia and urinary tract infection - UA suggestive of UTI, chest x-ray suggestive of possible aspiration pneumonia - Antibiotics-vancomycin, cefepime, Flagyl - Sepsis protocol initiated - Check procalcitonin  Tracheostomy obstruction -Status post tracheostomy revision by Dr. Fredric Dine ENT on 5/28. Please keep obturator taped to [place] changed to [place] size 4 cuffless without difficulty on 6/2 -Follow up with Dr. Constance Holster in 6 months, follow up with trach clinic after SNF discharge .   Severe protein calorie malnutrition -Appreciate dietitian, palliative care medicine   COPD -Stable   Tobacco abuse -Cessation counseling -Nicotine patch daily    Sinus tachycardia -Resolved   PT/OT=SNF/LTC   DVT prophylaxis: SCDs Start: 05/24/21 2000 Code Status: DNR Family Communication: Sister  updated periodically  Status is: Inpatient  Remains inpatient appropriate because:Unsafe d/c plan   Dispo: The patient is from: Home              Anticipated d/c is to: LTAC              Patient currently is not medically stable as he developed sepsis while awaiting placement.  Ongoing treatment and evaluation.   Difficult to place patient Yes   Nutritional status  Nutrition Problem: Severe Malnutrition Etiology: chronic illness  Signs/Symptoms: severe fat depletion, severe muscle depletion  Interventions: Ensure Enlive (each supplement provides 350kcal and 20 grams of protein), MVI, Refer to RD note for recommendations, Magic cup  Body mass index is 16.83 kg/m.    Subjective: Patient was febrile overnight had to be started on broad-spectrum antibiotics due to concerns of UTI or pneumonia.  When I saw him at bedside this morning he did have any complaints felt okay.   Examination: Constitutional: Not in acute distress Respiratory: Clear to auscultation bilaterally Cardiovascular: Sinus tachycardia, normal sinus rhythm, no rubs Abdomen: Nontender nondistended good bowel sounds Musculoskeletal: No edema noted Skin: No rashes seen Neurologic: CN 2-12 grossly intact.  And nonfocal Psychiatric: Normal judgment and insight. Alert and oriented x 3. Normal mood.  Trach collar in place.    Objective: Vitals:   06/09/21 0400 06/09/21 0455 06/09/21 0601 06/09/21 0612  BP:  112/84 104/76   Pulse: (!) 108 (!) 125 (!) 118   Resp: 20 (!) 31    Temp:  (!) 102.8 F (39.3 C)    TempSrc:  Oral    SpO2:  95%  97%  Weight:      Height:        Intake/Output Summary (  Last 24 hours) at 06/09/2021 0701 Last data filed at 06/08/2021 1500 Gross per 24 hour  Intake --  Output 1000 ml  Net -1000 ml    Filed Weights   05/29/21 0555 06/03/21 0500 06/04/21 0526  Weight: 51.4 kg 49.9 kg 47.3 kg     Data Reviewed:   CBC: Recent Labs  Lab 06/09/21 0531  WBC 8.7  NEUTROABS  6.6  HGB 8.7*  HCT 28.3*  MCV 95.3  PLT 546   Basic Metabolic Panel: Recent Labs  Lab 06/03/21 0435 06/05/21 0421 06/06/21 0055 06/09/21 0003 06/09/21 0531  NA  --  135 134* 134* 136  K  --  4.0 3.9 3.4* 3.9  CL  --  91* 95* 97* 96*  CO2  --  34* 32 29 29  GLUCOSE  --  93 133* 156* 108*  BUN  --  14 11 11 15   CREATININE  --  0.51* 0.46* 0.56* 0.52*  CALCIUM  --  9.2 8.6* 8.7* 8.4*  MG 1.9 1.7 1.7 1.5*  --    GFR: Estimated Creatinine Clearance: 66.5 mL/min (A) (by C-G formula based on SCr of 0.52 mg/dL (L)). Liver Function Tests: Recent Labs  Lab 06/09/21 0531  AST 18  ALT 19  ALKPHOS 56  BILITOT 0.7  PROT 6.0*  ALBUMIN 2.5*   No results for input(s): LIPASE, AMYLASE in the last 168 hours. No results for input(s): AMMONIA in the last 168 hours. Coagulation Profile: Recent Labs  Lab 06/09/21 0531  INR 1.0   Cardiac Enzymes: No results for input(s): CKTOTAL, CKMB, CKMBINDEX, TROPONINI in the last 168 hours. BNP (last 3 results) No results for input(s): PROBNP in the last 8760 hours. HbA1C: No results for input(s): HGBA1C in the last 72 hours. CBG: No results for input(s): GLUCAP in the last 168 hours. Lipid Profile: No results for input(s): CHOL, HDL, LDLCALC, TRIG, CHOLHDL, LDLDIRECT in the last 72 hours. Thyroid Function Tests: No results for input(s): TSH, T4TOTAL, FREET4, T3FREE, THYROIDAB in the last 72 hours. Anemia Panel: No results for input(s): VITAMINB12, FOLATE, FERRITIN, TIBC, IRON, RETICCTPCT in the last 72 hours. Sepsis Labs: Recent Labs  Lab 06/09/21 0531  LATICACIDVEN 1.8    No results found for this or any previous visit (from the past 240 hour(s)).       Radiology Studies: DG CHEST PORT 1 VIEW  Result Date: 06/09/2021 CLINICAL DATA:  Sepsis EXAM: PORTABLE CHEST 1 VIEW COMPARISON:  Radiograph 05/17/2021, CT chest and neck 08/17/2020 FINDINGS: Tracheostomy tube tip terminates in the upper trachea, approximately 7.7 cm from the  carina. Telemetry leads overlie the chest. Some persistent patchy and reticulonodular opacities are present throughout the mid to lower lungs which could reflect sequela of aspiration or atypical infection, the overall extent of which is not significantly changed from comparison prior counting for differences in technique. Architectural distortion and paraseptal emphysematous changes are present throughout the lungs, similar to comparison exam. Stable cardiomediastinal contours with a calcified aorta. No acute osseous or chest Mullally abnormalities. IMPRESSION: Persistent reticulonodular opacities throughout both lungs with a mid to lower lung predominance, could reflect sequela of aspiration or infection. Overall extent not significantly changed from prior. Chronic architectural distortion in the lung apices, right greater than left where more thick-walled cavitary changes are present. Aortic Atherosclerosis (ICD10-I70.0). Electronically Signed   By: Lovena Le M.D.   On: 06/09/2021 06:27        Scheduled Meds:  dexamethasone  1 mg Oral Q breakfast  feeding supplement  237 mL Oral BID BM   mometasone-formoterol  2 puff Inhalation BID   multivitamin with minerals  1 tablet Oral Daily   pantoprazole  40 mg Oral Daily   polyethylene glycol  17 g Oral Daily   Continuous Infusions:  ceFEPime (MAXIPIME) IV     metronidazole     vancomycin 1,000 mg (06/09/21 8208)   [START ON 06/10/2021] vancomycin       LOS: 16 days   Time spent= 35 mins    Ryleeann Urquiza Arsenio Loader, MD [facility]  If 7PM-7AM, please contact night-coverage  06/09/2021, 7:01 AM

## 2021-06-09 NOTE — Plan of Care (Signed)

## 2021-06-10 LAB — RESP PANEL BY RT-PCR (FLU A&B, COVID) ARPGX2
Influenza A by PCR: NEGATIVE
Influenza B by PCR: NEGATIVE
SARS Coronavirus 2 by RT PCR: NEGATIVE

## 2021-06-10 LAB — RESPIRATORY PANEL BY PCR

## 2021-06-10 MED ORDER — METOPROLOL TARTRATE 5 MG/5ML IV SOLN
5.0000 mg | Freq: Once | INTRAVENOUS | Status: AC | PRN
  Administered 2021-06-10: 5 mg via INTRAVENOUS
  Filled 2021-06-10: qty 5

## 2021-06-10 MED ORDER — LACTATED RINGERS IV BOLUS
1000.0000 mL | Freq: Once | INTRAVENOUS | Status: AC
  Administered 2021-06-10: 1000 mL via INTRAVENOUS

## 2021-06-10 NOTE — Plan of Care (Signed)

## 2021-06-10 NOTE — Progress Notes (Signed)
Floor coverage progress note  Informed by RN that patient is tachycardic.  Patient was seen and examined at bedside.  Awake and alert, resting comfortably, no complaints.  Temperature 102.8 F, heart rate in the 130s to 140s (sinus rhythm, EKG done), blood pressure 146/93. SPO2 94% on 5 L via trach collar (no change in oxygen requirement), no signs of respiratory distress.  Slightly coarse breath sounds appreciated upon auscultation of the lungs, no wheezing.  No peripheral edema.  Sepsis work-up was done yesterday and revealed evidence of both pneumonia and UTI for which he is on antibiotics.  Suspect tachycardia is due to underlying infection.  -1 L fluid bolus given but still tachycardic, will give a dose of IV Lopressor to see if heart rate responds.  Continue IV fluid hydration.  Continue antibiotics.  Tylenol as needed for fevers.  Monitor very closely.

## 2021-06-10 NOTE — Progress Notes (Signed)
  Speech Language Pathology Treatment: Dysphagia  Patient Details Name: Perry Waller MRN: [id number] DOB: Sep 21, 1961 Today's Date: 06/10/2021 Time: 2993-7169 SLP Time Calculation (min) (ACUTE ONLY): 23 min  Assessment / Plan / Recommendation Clinical Impression  Perry Waller was seen at RN request due to concern for aspiration. Pt has increased tracheal secretions, requiring more frequent suction, is wearing the [name] less due to this, and is having more coughing with intake. CXR completed yesterday 06/09/21 is concerning for aspiration. He was seen with honey thick liquids via cup/straw with intermittent wet vocal quality noted. Vocal quality is weak and hoarse both with and without PMV in place. Pt can independently don and doff PMV, but has mostly left it off the past few days. He took one bite of pancake with timely mastication and adequate oral clearance. He was noted to independently utilize liquid wash and multiple swallows. Given concern for aspiration on CXR, increased tracheal secretions, and cough with liquid intake, recommend a repeat MBSS prior to his discharge to SNF. Pt in agreement with plan. MBSS to occur tomorrow morning.    HPI HPI: Perry Waller is a 60 y/o M with history of laryngeal cancer, status post chemo and XRT, status post trach in 2020 and PEG tube. Pt did complete SLP visits in 2021 during radiation but did not attend all sessions and eventually stopped attending though dysphagia was persistent. His last MBS on 01/18/20 recommended regular solids and thin liquids "Cued cough cleared trace aspirates and supraglottic swallow with extended breath hold effective to prevent aspiration." The study also repoted narrow airway. Pt has had subsequent decannulation and removal of PEG tube but presented to radiation oncologist on 05/15/21 and then ENT with stridor, now s/p tracheostomy 05/25/21 and direct laryngoscopy and biopsy. Pt has a persistent tracheocutaneous fistula. During bronchoscopy, pt  desaturated and there was difficulty placing a 6 cuffed shiley and concern for damage to the posterior tracheal [place]. A 4 -0 shiley was placed. during laryngoscopy pt was found to have "Extensive fibrinous debris and thick drainage coating and obstructing the supraglottis and glottis, which was easily suctioned. Mild edema and post-radiation changes of the true and false vocal folds and arytenoids. No mass lesion appreciated." SLP was ordered to evaluate swallowing, but ENT note specifically stated pt not be given Barium, only Gastrografin, likely due to concern for fistula. Now with increased trachael secretions, requiring more frequent suction, not tolerating PMV as well, needs repeat imaging.      SLP Plan  Continue with current plan of care       Recommendations  Liquids provided via: Cup;Straw Medication Administration: Whole meds with puree Supervision: Patient able to self feed Compensations: Slow rate;Small sips/bites Postural Changes and/or Swallow Maneuvers: Seated upright 90 degrees      Patient may use Passy-Muir Speech Valve: During all waking hours (remove during sleep);During PO intake/meals [name] Supervision: Intermittent         Oral Care Recommendations: Oral care BID Follow up Recommendations: Skilled Nursing facility SLP Visit Diagnosis: Dysphagia, pharyngeal phase (R13.13) Plan: Continue with current plan of care                     Perry Waller P. Averly Ericson, M.S., [place] Pathologist Acute Rehabilitation Services Pager: [place] 06/10/2021, 11:00 AM

## 2021-06-10 NOTE — TOC Progression Note (Signed)
Transition of Care [facility]) - Progression Note    Patient Details  Name: Perry Waller MRN: [id number] Date of Birth: 30-Mar-1961  [facility] CM/SW Contact  Joanne Chars, LCSW Phone Number: 06/10/2021, 2:05 PM  Clinical Narrative:   CSW spoke with Melissa at [facility] regarding pt admission.  Her billing department will review pt medicaid today.  She will reach back out later in the day.      Expected Discharge Plan: Skilled Nursing Facility Barriers to Discharge: Continued Medical Work up, SNF Pending bed offer  Expected Discharge Plan and Services Expected Discharge Plan: [place] In-house Referral: Clinical Social Work Discharge Planning Services: CM Consult                                           Social Determinants of Health (SDOH) Interventions    Readmission Risk Interventions No flowsheet data found.

## 2021-06-10 NOTE — Progress Notes (Signed)
Physical Therapy Treatment Patient Details Name: Perry Waller MRN: [id number] DOB: 1961/09/29 Today's Date: 06/10/2021    History of Present Illness 60 y.o. male presenting on 5/27 from ENT clinic with tracheostomy obstruction. Patient transferred to [facility] for tracheostomy revision. Hospital admission complicated by development of sepsis secondary to UTI and PNA. 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    PT Comments    Patient continues to prefer to use rollator, yet continues to require cues for safe use (although cuing has decreased from max/constant to min/intermittent). Attempts to ambulate without rollator have shown pt to be unsteady with drifting rt and left. Participates well in standing exercises.     Follow Up Recommendations  SNF;Other (comment) (LTC vs return home with family with HHPT)     Equipment Recommendations  Other (comment) (rollator)    Recommendations for Other Services       Precautions / Restrictions Precautions Precautions: Fall Restrictions Weight Bearing Restrictions: No    Mobility  Bed Mobility Overal bed mobility: Modified Independent                  Transfers Overall transfer level: Needs assistance Equipment used: 4-wheeled walker Transfers: Sit to/from Stand Sit to Stand: Min guard         General transfer comment: pt unsafe during transfers with rollator (does not lock brakes, holding handles each time, slightly flopping into chair each time he sits  Ambulation/Gait Ambulation/Gait assistance: Min guard Gait Distance (Feet): 150 Feet Assistive device: 4-wheeled walker Gait Pattern/deviations: Step-through pattern;Decreased stride length Gait velocity: improved pace and control of rollator   General Gait Details: proximity to RW improved with fewer cues needed; cues for uprigght posture; pt with better velocity to  match his ability (slower than he has been which was needed)   Chief Strategy Officer    Modified Rankin (Stroke Patients Only)       Balance Overall balance assessment: Needs assistance Sitting-balance support: No upper extremity supported;Feet supported Sitting balance-Leahy Scale: Good     Standing balance support: During functional activity;No upper extremity supported Standing balance-Leahy Scale: Fair Standing balance comment: Able to maintain static standing balance without UE support during grooming at sink level.                            Cognition Arousal/Alertness: Awake/alert Behavior During Therapy: WFL for tasks assessed/performed (low frustration tolerance) Overall Cognitive Status: No family/caregiver present to determine baseline cognitive functioning Area of Impairment: Safety/judgement                     Memory: Decreased short-term memory   Safety/Judgement: Decreased awareness of safety     General Comments: pt unsafe during transfers with rollator (does not lock brakes, holding handles each time, slightly flopping into chair each time he sits      Exercises General Exercises - Lower Extremity Hip ABduction/ADduction: AROM;Both;15 reps;Standing (bil UE support on walker) Hip Flexion/Marching: AROM;Both;15 reps;Standing (bil UE support on walker) Heel Raises: AROM;Both;10 reps;Standing (bil UE support on walker) Other Exercises Other Exercises: Incentive spirometer x10 with ability to pull 575mL.    General Comments General comments (skin integrity, edema, etc.): Education on use of IS x10 each hour. Patient expressed verbal understanding.      Pertinent Vitals/Pain Pain Assessment:  No/denies pain Faces Pain Scale: Hurts little more Pain Location: With bouts of coughing Pain Descriptors / Indicators: Grimacing Pain Intervention(s): Monitored during session;Repositioned    Home Living                       Prior Function            PT Goals (current goals can now be found in the care plan section) Acute Rehab PT Goals Patient Stated Goal: "To get up outta here" PT Goal Formulation: With patient Time For Goal Achievement: 06/24/21 Potential to Achieve Goals: Good Progress towards PT goals:  (goals updated based on timeframe ended 6.13)    Frequency    Min 2X/week      PT Plan Current plan remains appropriate (anticipate he will need LTC ultimately)    Co-evaluation              AM-PAC PT "6 Clicks" Mobility   Outcome Measure  Help needed turning from your back to your side while in a flat bed without using bedrails?: None Help needed moving from lying on your back to sitting on the side of a flat bed without using bedrails?: None Help needed moving to and from a bed to a chair (including a wheelchair)?: A Little Help needed standing up from a chair using your arms (e.g., wheelchair or bedside chair)?: A Little Help needed to walk in hospital room?: A Little Help needed climbing 3-5 steps with a railing? : A Little 6 Click Score: 20    End of Session   Activity Tolerance: Patient tolerated treatment well (sats 98%, max HR 120) Patient left: with call bell/phone within reach;in chair;with chair alarm set Nurse Communication: Mobility status PT Visit Diagnosis: Unsteadiness on feet (R26.81);Muscle weakness (generalized) (M62.81);Difficulty in walking, not elsewhere classified (R26.2)     Time: 4174-0814 PT Time Calculation (min) (ACUTE ONLY): 26 min  Charges:  $Gait Training: 8-22 mins $Therapeutic Exercise: 8-22 mins                      Arby Barrette, PT Pager [phone]    Rexanne Mano 06/10/2021, 4:14 PM

## 2021-06-10 NOTE — Progress Notes (Signed)
PROGRESS NOTE    Perry Waller  VOZ:[id number] DOB: 07/15/1961 DOA: 05/24/2021 PCP: Pcp, No   Brief Narrative:  60 year old African-American male with past medical history significant for stage IV laryngeal cancer status postchemotherapy and radiation therapy, s/p tracheostomy in August 2020, COPD, chronic tobacco use, severe PEM and chronic anemia.  Patient was admitted from the ENTs office with concerns for tracheostomy obstruction and plans for tracheostomy revision procedure.  Patient has undergone revision of the tracheostomy on 5/28.  ENT team is managing patient's tracheostomy tube postop.  Patient is medically stable for discharge, awaiting SNF placement with trach.  While awaiting placement developed sepsis secondary to UTI and pneumonia.   Assessment & Plan:   Principal Problem:   Tracheostomy obstruction (HCC) Active Problems:   Tobacco abuse   Protein-calorie malnutrition, severe   Tracheal stenosis following tracheostomy (HCC)   Dehydration   Prerenal azotemia   COPD (chronic obstructive pulmonary disease) (HCC)   Chronic anemia   Sepsis (HCC)     Sepsis secondary to aspiration pneumonia and urinary tract infection, recurrent fever. T Max 102.8 UTI, Gram Neg Rods - UA suggestive of UTI, chest x-ray suggestive of possible aspiration pneumonia - Antibiotics-vancomycin, cefepime, Flagyl - Sepsis protocol initiated -Procalcitonin-negative - Repeat COVID and flu- neg.   Tracheostomy obstruction -Status post tracheostomy revision by Dr. Fredric Dine ENT on 5/28. Please keep obturator taped to [place] changed to Shiley size 4 cuffless without difficulty on 6/2 -Follow up with Dr. Constance Holster in 6 months, follow up with trach clinic after SNF discharge .   Severe protein calorie malnutrition -Appreciate dietitian, palliative care medicine   COPD -Stable   Tobacco abuse -Cessation counseling -Nicotine patch daily    Sinus tachycardia -Resolved   PT/OT=SNF/LTC   DVT  prophylaxis: SCDs Start: 05/24/21 2000 Code Status: DNR Family Communication: Called Mrs Lajoyce Corners  Status is: Inpatient  Remains inpatient appropriate because:Unsafe d/c plan   Dispo: The patient is from: Home              Anticipated d/c is to: LTAC              Patient currently is not medically stable as he developed sepsis while awaiting placement.  Ongoing treatment and evaluation.   Difficult to place patient Yes   Nutritional status  Nutrition Problem: Severe Malnutrition Etiology: chronic illness  Signs/Symptoms: severe fat depletion, severe muscle depletion  Interventions: Ensure Enlive (each supplement provides 350kcal and 20 grams of protein), MVI, Refer to RD note for recommendations, Magic cup  Body mass index is 16.83 kg/m.    Subjective: Patient was febrile overnight with tachycardia.  Fever was as high as 102.8.  Patient doesn't have any complaints.   Examination: Constitutional: Not in acute distress, frail, cachectic, b/l temporal wasting Respiratory: Clear to auscultation bilaterally Cardiovascular: Normal sinus rhythm, no rubs Abdomen: Nontender nondistended good bowel sounds Musculoskeletal: No edema noted Skin: No rashes seen Neurologic: CN 2-12 grossly intact.  And nonfocal Psychiatric: Normal judgment and insight. Alert and oriented x 3. Normal mood.    Trach collar in place.    Objective: Vitals:   06/10/21 0228 06/10/21 0338 06/10/21 0349 06/10/21 0419  BP: (!) 146/93 101/71  112/73  Pulse: (!) 138 (!) 117 (!) 121 (!) 116  Resp:   (!) 24   Temp: (!) 102.8 F (39.3 C) (!) 101 F (38.3 C)  99.6 F (37.6 C)  TempSrc: Oral Oral  Oral  SpO2:   93%   Weight:  Height:        Intake/Output Summary (Last 24 hours) at 06/10/2021 0807 Last data filed at 06/10/2021 0417 Gross per 24 hour  Intake --  Output 3402 ml  Net -3402 ml    Filed Weights   05/29/21 0555 06/03/21 0500 06/04/21 0526  Weight: 51.4 kg 49.9 kg 47.3 kg      Data Reviewed:   CBC: Recent Labs  Lab 06/09/21 0531  WBC 8.7  NEUTROABS 6.6  HGB 8.7*  HCT 28.3*  MCV 95.3  PLT 161   Basic Metabolic Panel: Recent Labs  Lab 06/05/21 0421 06/06/21 0055 06/09/21 0003 06/09/21 0531  NA 135 134* 134* 136  K 4.0 3.9 3.4* 3.9  CL 91* 95* 97* 96*  CO2 34* 32 29 29  GLUCOSE 93 133* 156* 108*  BUN 14 11 11 15   CREATININE 0.51* 0.46* 0.56* 0.52*  CALCIUM 9.2 8.6* 8.7* 8.4*  MG 1.7 1.7 1.5*  --    GFR: Estimated Creatinine Clearance: 66.5 mL/min (A) (by C-G formula based on SCr of 0.52 mg/dL (L)). Liver Function Tests: Recent Labs  Lab 06/09/21 0531  AST 18  ALT 19  ALKPHOS 56  BILITOT 0.7  PROT 6.0*  ALBUMIN 2.5*   No results for input(s): LIPASE, AMYLASE in the last 168 hours. No results for input(s): AMMONIA in the last 168 hours. Coagulation Profile: Recent Labs  Lab 06/09/21 0531  INR 1.0   Cardiac Enzymes: No results for input(s): CKTOTAL, CKMB, CKMBINDEX, TROPONINI in the last 168 hours. BNP (last 3 results) No results for input(s): PROBNP in the last 8760 hours. HbA1C: No results for input(s): HGBA1C in the last 72 hours. CBG: No results for input(s): GLUCAP in the last 168 hours. Lipid Profile: No results for input(s): CHOL, HDL, LDLCALC, TRIG, CHOLHDL, LDLDIRECT in the last 72 hours. Thyroid Function Tests: No results for input(s): TSH, T4TOTAL, FREET4, T3FREE, THYROIDAB in the last 72 hours. Anemia Panel: No results for input(s): VITAMINB12, FOLATE, FERRITIN, TIBC, IRON, RETICCTPCT in the last 72 hours. Sepsis Labs: Recent Labs  Lab 06/09/21 0531 06/09/21 0816  PROCALCITON <0.10  --   LATICACIDVEN 1.8 1.9    Recent Results (from the past 240 hour(s))  Culture, blood (x 2)     Status: None (Preliminary result)   Collection Time: 06/09/21  5:31 AM   Specimen: BLOOD  Result Value Ref Range Status   Specimen Description BLOOD LEFT ANTECUBITAL  Final   Special Requests   Final    BOTTLES DRAWN  AEROBIC AND ANAEROBIC Blood Culture adequate volume   Culture   Final    NO GROWTH < 24 HOURS Performed at [facility], [address] [place], [place] [address]    Report Status PENDING  Incomplete  Culture, blood (x 2)     Status: None (Preliminary result)   Collection Time: 06/09/21  5:38 AM   Specimen: BLOOD LEFT HAND  Result Value Ref Range Status   Specimen Description BLOOD LEFT HAND  Final   Special Requests   Final    BOTTLES DRAWN AEROBIC ONLY Blood Culture results may not be optimal due to an inadequate volume of blood received in culture bottles   Culture   Final    NO GROWTH < 24 HOURS Performed at [facility], [place] [address] [place],  [address]    Report Status PENDING  Incomplete         Radiology Studies: DG CHEST PORT 1 VIEW  Result  Date: 06/09/2021 CLINICAL DATA:  Sepsis EXAM: PORTABLE CHEST 1 VIEW COMPARISON:  Radiograph 05/17/2021, CT chest and neck 08/17/2020 FINDINGS: Tracheostomy tube tip terminates in the upper trachea, approximately 7.7 cm from the carina. Telemetry leads overlie the chest. Some persistent patchy and reticulonodular opacities are present throughout the mid to lower lungs which could reflect sequela of aspiration or atypical infection, the overall extent of which is not significantly changed from comparison prior counting for differences in technique. Architectural distortion and paraseptal emphysematous changes are present throughout the lungs, similar to comparison exam. Stable cardiomediastinal contours with a calcified aorta. No acute osseous or chest Dewan abnormalities. IMPRESSION: Persistent reticulonodular opacities throughout both lungs with a mid to lower lung predominance, could reflect sequela of aspiration or infection. Overall extent not significantly changed from prior. Chronic architectural distortion in the lung apices, right greater than left where more thick-walled cavitary changes are present. Aortic  Atherosclerosis (ICD10-I70.0). Electronically Signed   By: Lovena Le M.D.   On: 06/09/2021 06:27        Scheduled Meds:  dexamethasone  1 mg Oral Q breakfast   feeding supplement  237 mL Oral BID BM   mometasone-formoterol  2 puff Inhalation BID   multivitamin with minerals  1 tablet Oral Daily   pantoprazole  40 mg Oral Daily   polyethylene glycol  17 g Oral Daily   Continuous Infusions:  ceFEPime (MAXIPIME) IV 2 g (06/10/21 0617)   lactated ringers 125 mL/hr at 06/10/21 0305   metronidazole 500 mg (06/10/21 0516)   vancomycin 1,000 mg (06/10/21 0704)     LOS: 17 days   Time spent= 35 mins    Monseratt Ledin Arsenio Loader, MD [facility]  If 7PM-7AM, please contact night-coverage  06/10/2021, 8:07 AM

## 2021-06-10 NOTE — Progress Notes (Signed)
Occupational Therapy Treatment Patient Details Name: Perry Waller MRN: [id number] DOB: [birth year] Today's Date: 06/10/2021    History of present illness 60 y.o. male presenting on 5/27 from ENT clinic with tracheostomy obstruction. Patient transferred to [facility] for tracheostomy revision. Hospital admission complicated by development of sepsis secondary to UTI and PNA. 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   OT comments  Patient making steady progress toward goals. Noted decreased activity tolerance with new onset PNA. Patient completing bed mobility with Mod I and functional transfers, mobility and ADLs in sitting/standing with supervision to Painter guard for safety/steadying. Patient would benefit from continued acute OT services to maximize safety and independence with self-care tasks in prep for safe d/c to next level of care.    Follow Up Recommendations  SNF;Supervision/Assistance - 24 hour    Equipment Recommendations  3 in 1 bedside commode;Tub/shower seat    Recommendations for Other Services      Precautions / Restrictions Precautions Precautions: Fall Restrictions Weight Bearing Restrictions: No       Mobility Bed Mobility Overal bed mobility: Modified Independent                  Transfers Overall transfer level: Needs assistance Equipment used: None Transfers: Sit to/from Stand Sit to Stand: Supervision         General transfer comment: Supervision A for safety and line management. Swaying noted but no overt LOB.    Balance Overall balance assessment: Needs assistance Sitting-balance support: No upper extremity supported;Feet supported Sitting balance-Leahy Scale: Good     Standing balance support: During functional activity;No upper extremity supported Standing balance-Leahy Scale: Fair Standing balance comment: Able to maintain static  standing balance without UE support during grooming at sink level.                           ADL either performed or assessed with clinical judgement   ADL Overall ADL's : Needs assistance/impaired     Grooming: Supervision/safety;Standing Grooming Details (indicate cue type and reason): Supervision A for grooming standing at sink level. Swaying noted with no overt LOB.             Lower Body Dressing: Set up;Min guard;Sit to/from stand Lower Body Dressing Details (indicate cue type and reason): Able to don footwear seated EOB.             Functional mobility during ADLs: Min guard       Vision       Perception     Praxis      Cognition Arousal/Alertness: Awake/alert Behavior During Therapy: WFL for tasks assessed/performed (low frustration tolerance)                         Memory: Decreased short-term memory         General Comments: Patient polite and appropriate with this therapist. Able to make needs known with some difficulty secondary to trach.        Exercises Exercises: Other exercises Other Exercises Other Exercises: Incentive spirometer x10 with ability to pull 564mL.   Shoulder Instructions       General Comments Education on use of IS x10 each hour. Patient expressed verbal understanding.    Pertinent Vitals/ Pain       Pain Assessment: Faces Faces Pain Scale: Hurts little more Pain Location: With  bouts of coughing Pain Descriptors / Indicators: Grimacing Pain Intervention(s): Monitored during session;Repositioned  Home Living                                          Prior Functioning/Environment              Frequency  Min 2X/week        Progress Toward Goals  OT Goals(current goals can now be found in the care plan section)  Progress towards OT goals: Progressing toward goals  Acute Rehab OT Goals Patient Stated Goal: "To get up outta here" OT Goal Formulation: With  patient Time For Goal Achievement: 06/10/21 Potential to Achieve Goals: Good ADL Goals Pt Will Perform Grooming: with modified independence;standing Pt Will Perform Upper Body Dressing: with modified independence Pt Will Perform Lower Body Dressing: with modified independence;sit to/from stand Pt Will Transfer to Toilet: with modified independence;ambulating Pt Will Perform Toileting - Clothing Manipulation and hygiene: with modified independence;sit to/from stand  Plan Discharge plan remains appropriate;Frequency remains appropriate    Co-evaluation                 AM-PAC OT "6 Clicks" Daily Activity     Outcome Measure   Help from another person eating meals?: None Help from another person taking care of personal grooming?: A Little Help from another person toileting, which includes using toliet, bedpan, or urinal?: A Little Help from another person bathing (including washing, rinsing, drying)?: A Little Help from another person to put on and taking off regular upper body clothing?: A Little Help from another person to put on and taking off regular lower body clothing?: A Little 6 Click Score: 19    End of Session Equipment Utilized During Treatment: Other (comment);Oxygen  OT Visit Diagnosis: Unsteadiness on feet (R26.81);Muscle weakness (generalized) (M62.81)   Activity Tolerance Patient tolerated treatment well   Patient Left in chair;with call bell/phone within reach;with chair alarm set   Nurse Communication          Time: 2025-4270 OT Time Calculation (min): 22 min  Charges: OT General Charges $OT Visit: 1 Visit OT Treatments $Therapeutic Activity: 8-22 mins  Shaquina Gillham H. OTR/L Supplemental OT, Department of rehab services [phone]  Juhi Lagrange R H. 06/10/2021, 1:51 PM

## 2021-06-11 ENCOUNTER — Inpatient Hospital Stay (HOSPITAL_COMMUNITY): Payer: Medicaid Other

## 2021-06-11 DIAGNOSIS — J9509 Other tracheostomy complication: Secondary | ICD-10-CM | POA: Diagnosis not present

## 2021-06-11 LAB — CBC
HCT: 26.7 % — ABNORMAL LOW (ref 39.0–52.0)
Hemoglobin: 8.4 g/dL — ABNORMAL LOW (ref 13.0–17.0)
MCH: 29.5 pg (ref 26.0–34.0)
MCHC: 31.5 g/dL (ref 30.0–36.0)
MCV: 93.7 fL (ref 80.0–100.0)
Platelets: 321 10*3/uL (ref 150–400)
RBC: 2.85 MIL/uL — ABNORMAL LOW (ref 4.22–5.81)
RDW: 17.4 % — ABNORMAL HIGH (ref 11.5–15.5)
WBC: 8.8 10*3/uL (ref 4.0–10.5)
nRBC: 0 % (ref 0.0–0.2)

## 2021-06-11 LAB — URINE CULTURE: Culture: >=100000 — AB

## 2021-06-11 LAB — COMPREHENSIVE METABOLIC PANEL
ALT: 17 U/L (ref 0–44)
AST: 16 U/L (ref 15–41)
Albumin: 2.2 g/dL — ABNORMAL LOW (ref 3.5–5.0)
Alkaline Phosphatase: 60 U/L (ref 38–126)
Anion gap: 7 (ref 5–15)
BUN: 7 mg/dL (ref 6–20)
CO2: 30 mmol/L (ref 22–32)
Calcium: 8.4 mg/dL — ABNORMAL LOW (ref 8.9–10.3)
Chloride: 96 mmol/L — ABNORMAL LOW (ref 98–111)
Creatinine, Ser: 0.45 mg/dL — ABNORMAL LOW (ref 0.61–1.24)
GFR, Estimated: >60 mL/min (ref >60)
Glucose, Bld: 88 mg/dL (ref 70–99)
Potassium: 4 mmol/L (ref 3.5–5.1)
Sodium: 133 mmol/L — ABNORMAL LOW (ref 135–145)
Total Bilirubin: 1 mg/dL (ref 0.3–1.2)
Total Protein: 5.9 g/dL — ABNORMAL LOW (ref 6.5–8.1)

## 2021-06-11 LAB — MAGNESIUM: Magnesium: 1.6 mg/dL — ABNORMAL LOW (ref 1.7–2.4)

## 2021-06-11 LAB — SARS CORONAVIRUS 2 (TAT 6-24 HRS): SARS Coronavirus 2: NEGATIVE

## 2021-06-11 MED ORDER — CEFDINIR 300 MG PO CAPS
300.0000 mg | ORAL_CAPSULE | Freq: Two times a day (BID) | ORAL | Status: DC
  Administered 2021-06-11 – 2021-06-13 (×5): 300 mg via ORAL
  Filled 2021-06-11 (×7): qty 1

## 2021-06-11 MED ORDER — MAGNESIUM SULFATE 2 GM/50ML IV SOLN
2.0000 g | Freq: Once | INTRAVENOUS | Status: AC
  Administered 2021-06-11: 2 g via INTRAVENOUS
  Filled 2021-06-11: qty 50

## 2021-06-11 MED ORDER — SODIUM CHLORIDE 0.9 % IV SOLN: INTRAVENOUS | Status: DC

## 2021-06-11 MED ORDER — CEFDINIR 300 MG PO CAPS
300.0000 mg | ORAL_CAPSULE | Freq: Two times a day (BID) | ORAL | Status: DC
  Filled 2021-06-11: qty 1

## 2021-06-11 NOTE — Plan of Care (Signed)

## 2021-06-11 NOTE — Progress Notes (Signed)
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  Patient was admitted from the ENTs office with concerns for tracheostomy obstruction and plans for tracheostomy revision procedure.  Patient has undergone revision of the tracheostomy on 5/28.  ENT team is managing patient's tracheostomy tube postop.  Patient is medically stable for discharge, awaiting SNF placement with trach.  While awaiting placement developed sepsis secondary to UTI.   Assessment & Plan:   Principal Problem:   Tracheostomy obstruction (HCC) Active Problems:   Tobacco abuse   Protein-calorie malnutrition, severe   Tracheal stenosis following tracheostomy (HCC)   Dehydration   Prerenal azotemia   COPD (chronic obstructive pulmonary disease) (HCC)   Chronic anemia   Sepsis (HCC)     Sepsis secondary to aspiration pneumonia and urinary tract infection, recurrent fever. T Max 102.8 UTI, Proteus - UA suggestive of UTI.  Cultures growing Proteus.  Susceptibility pending - Continue IV cefepime-tailor as appropriate - LR 75cc/hr - Sepsis physiology improving -Procalcitonin-negative - Repeat COVID and flu- neg.   Tracheostomy obstruction -Status post tracheostomy revision by Dr. Fredric Dine ENT on 5/28. Please keep obturator taped to [place] changed to [place] size 4 cuffless without difficulty on 6/2 -Follow up with Dr. Constance Holster in 6 months, follow up with trach clinic after SNF discharge .   Severe protein calorie malnutrition -Appreciate dietitian, palliative care medicine   COPD -Stable   Tobacco abuse -Cessation counseling -Nicotine patch daily    Sinus tachycardia -Resolved   PT/OT=SNF/LTC   DVT  prophylaxis: SCDs Start: 05/24/21 2000 Code Status: DNR Family Communication: Sister called [phone]- no answer  Status is: Inpatient  Remains inpatient appropriate because:Unsafe d/c plan   Dispo: The patient is from: Home              Anticipated d/c is to: SNF              Patient currently is not medically stable as he developed sepsis while awaiting placement.  Ongoing treatment and evaluation.  Hopefully SNF in next 24-48 hours.  Placement COVID swab ordered   Difficult to place patient Yes   Nutritional status  Nutrition Problem: Severe Malnutrition Etiology: chronic illness  Signs/Symptoms: severe fat depletion, severe muscle depletion  Interventions: Ensure Enlive (each supplement provides 350kcal and 20 grams of protein), MVI, Refer to RD note for recommendations, Magic cup  Body mass index is 16.83 kg/m.    Subjective: No acute events overnight, feels ok.   Examination: Constitutional: Not in acute distress, frail cachecitc Respiratory: Minimal bibasilar crackles Cardiovascular: Normal sinus rhythm, no rubs Abdomen: Nontender nondistended good bowel sounds Musculoskeletal: No edema noted Skin: No rashes seen Neurologic: CN 2-12 grossly intact.  And nonfocal Psychiatric: Normal judgment and insight. Alert and oriented x 3. Normal mood.    Trach collar in place.    Objective: Vitals:   06/10/21 2019 06/10/21 2321 06/11/21 0344 06/11/21 0514  BP: 123/75   137/77  Pulse: (!) 109 (!) 101 (!) 104 (!) 114  Resp: 20 (!) 22 18 18   Temp: 97.9 F (36.6 C)   98.9 F (37.2 C)  TempSrc: Oral   Oral  SpO2: 97% 97% 96% 95%  Weight:      Height:  Intake/Output Summary (Last 24 hours) at 06/11/2021 0711 Last data filed at 06/11/2021 0533 Gross per 24 hour  Intake 1614 ml  Output 5326 ml  Net -3712 ml    Filed Weights   05/29/21 0555 06/03/21 0500 06/04/21 0526  Weight: 51.4 kg 49.9 kg 47.3 kg     Data Reviewed:   CBC: Recent Labs  Lab  06/09/21 0531 06/11/21 0031  WBC 8.7 8.8  NEUTROABS 6.6  --   HGB 8.7* 8.4*  HCT 28.3* 26.7*  MCV 95.3 93.7  PLT 331 175   Basic Metabolic Panel: Recent Labs  Lab 06/05/21 0421 06/06/21 0055 06/09/21 0003 06/09/21 0531 06/11/21 0031  NA 135 134* 134* 136 133*  K 4.0 3.9 3.4* 3.9 4.0  CL 91* 95* 97* 96* 96*  CO2 34* 32 29 29 30   GLUCOSE 93 133* 156* 108* 88  BUN 14 11 11 15 7   CREATININE 0.51* 0.46* 0.56* 0.52* 0.45*  CALCIUM 9.2 8.6* 8.7* 8.4* 8.4*  MG 1.7 1.7 1.5*  --  1.6*   GFR: Estimated Creatinine Clearance: 66.5 mL/min (A) (by C-G formula based on SCr of 0.45 mg/dL (L)). Liver Function Tests: Recent Labs  Lab 06/09/21 0531 06/11/21 0031  AST 18 16  ALT 19 17  ALKPHOS 56 60  BILITOT 0.7 1.0  PROT 6.0* 5.9*  ALBUMIN 2.5* 2.2*   No results for input(s): LIPASE, AMYLASE in the last 168 hours. No results for input(s): AMMONIA in the last 168 hours. Coagulation Profile: Recent Labs  Lab 06/09/21 0531  INR 1.0   Cardiac Enzymes: No results for input(s): CKTOTAL, CKMB, CKMBINDEX, TROPONINI in the last 168 hours. BNP (last 3 results) No results for input(s): PROBNP in the last 8760 hours. HbA1C: No results for input(s): HGBA1C in the last 72 hours. CBG: No results for input(s): GLUCAP in the last 168 hours. Lipid Profile: No results for input(s): CHOL, HDL, LDLCALC, TRIG, CHOLHDL, LDLDIRECT in the last 72 hours. Thyroid Function Tests: No results for input(s): TSH, T4TOTAL, FREET4, T3FREE, THYROIDAB in the last 72 hours. Anemia Panel: No results for input(s): VITAMINB12, FOLATE, FERRITIN, TIBC, IRON, RETICCTPCT in the last 72 hours. Sepsis Labs: Recent Labs  Lab 06/09/21 0531 06/09/21 0816  PROCALCITON <0.10  --   LATICACIDVEN 1.8 1.9    Recent Results (from the past 240 hour(s))  Culture, Urine     Status: Abnormal (Preliminary result)   Collection Time: 06/09/21  5:20 AM   Specimen: Urine, Random  Result Value Ref Range Status   Specimen  Description URINE, RANDOM  Final   Special Requests NONE  Final   Culture (A)  Final    >=100,000 COLONIES/mL PROTEUS MIRABILIS SUSCEPTIBILITIES TO FOLLOW Performed at [facility], [address] [place], [place] [address]    Report Status PENDING  Incomplete  Culture, blood (x 2)     Status: None (Preliminary result)   Collection Time: 06/09/21  5:31 AM   Specimen: BLOOD  Result Value Ref Range Status   Specimen Description BLOOD LEFT ANTECUBITAL  Final   Special Requests   Final    BOTTLES DRAWN AEROBIC AND ANAEROBIC Blood Culture adequate volume   Culture   Final    NO GROWTH < 24 HOURS Performed at [facility], [place] [address], [place], [place] [address]    Report Status PENDING  Incomplete  Culture, blood (x 2)     Status: None (Preliminary result)   Collection Time: 06/09/21  5:38 AM  Specimen: BLOOD LEFT HAND  Result Value Ref Range Status   Specimen Description BLOOD LEFT HAND  Final   Special Requests   Final    BOTTLES DRAWN AEROBIC ONLY Blood Culture results may not be optimal due to an inadequate volume of blood received in culture bottles   Culture   Final    NO GROWTH < 24 HOURS Performed at [facility] [address] [place], [place] [address]    Report Status PENDING  Incomplete  Resp Panel by RT-PCR (Flu A&B, Covid) Nasopharyngeal Swab     Status: None   Collection Time: 06/10/21  8:21 AM   Specimen: Nasopharyngeal Swab; Nasopharyngeal(NP) swabs in vial transport medium  Result Value Ref Range Status   SARS Coronavirus 2 by RT PCR NEGATIVE NEGATIVE Final    Comment: (NOTE) SARS-CoV-2 target nucleic acids are NOT DETECTED.  The SARS-CoV-2 RNA is generally detectable in upper respiratory specimens during the acute phase of infection. The lowest concentration of SARS-CoV-2 viral copies this assay can detect is 138 copies/mL. A negative result does not preclude SARS-Cov-2 infection and should not be used as the sole basis for treatment  or other patient management decisions. A negative result may occur with  improper specimen collection/handling, submission of specimen other than nasopharyngeal swab, presence of viral mutation(s) within the areas targeted by this assay, and inadequate number of viral copies(<138 copies/mL). A negative result must be combined with clinical observations, patient history, and epidemiological information. The expected result is Negative.  Fact Sheet for Patients:  EntrepreneurPulse.com.au  Fact Sheet for Healthcare Providers:  IncredibleEmployment.be  This test is no t yet approved or cleared by the [place] FDA and  has been authorized for detection and/or diagnosis of SARS-CoV-2 by FDA under an Emergency Use Authorization (EUA). This EUA will remain  in effect (meaning this test can be used) for the duration of the COVID-19 declaration under Section 564(b)(1) of the Act, 21 U.S.C.section 360bbb-3(b)(1), unless the authorization is terminated  or revoked sooner.       Influenza A by PCR NEGATIVE NEGATIVE Final   Influenza B by PCR NEGATIVE NEGATIVE Final    Comment: (NOTE) The Xpert Xpress SARS-CoV-2/FLU/RSV plus assay is intended as an aid in the diagnosis of influenza from Nasopharyngeal swab specimens and should not be used as a sole basis for treatment. Nasal washings and aspirates are unacceptable for Xpert Xpress SARS-CoV-2/FLU/RSV testing.  Fact Sheet for Patients: EntrepreneurPulse.com.au  Fact Sheet for Healthcare Providers: IncredibleEmployment.be  This test is not yet approved or cleared by the [place] FDA and has been authorized for detection and/or diagnosis of SARS-CoV-2 by FDA under an Emergency Use Authorization (EUA). This EUA will remain in effect (meaning this test can be used) for the duration of the COVID-19 declaration under Section 564(b)(1) of the Act, 21 U.S.C. section  360bbb-3(b)(1), unless the authorization is terminated or revoked.  Performed at [facility], [place] [address] [place], [place] [address]   Respiratory (~20 pathogens) panel by PCR     Status: None   Collection Time: 06/10/21  9:21 AM   Specimen: Nasopharyngeal Swab; Respiratory  Result Value Ref Range Status   Adenovirus NOT DETECTED NOT DETECTED Final   Coronavirus 229E NOT DETECTED NOT DETECTED Final    Comment: (NOTE) The Coronavirus on the Respiratory Panel, DOES NOT test for the novel  Coronavirus (2019 nCoV)    Coronavirus HKU1 NOT DETECTED NOT DETECTED Final   Coronavirus NL63 NOT DETECTED  NOT DETECTED Final   Coronavirus OC43 NOT DETECTED NOT DETECTED Final   Metapneumovirus NOT DETECTED NOT DETECTED Final   Rhinovirus / Enterovirus NOT DETECTED NOT DETECTED Final   Influenza A NOT DETECTED NOT DETECTED Final   Influenza B NOT DETECTED NOT DETECTED Final   Parainfluenza Virus 1 NOT DETECTED NOT DETECTED Final   Parainfluenza Virus 2 NOT DETECTED NOT DETECTED Final   Parainfluenza Virus 3 NOT DETECTED NOT DETECTED Final   Parainfluenza Virus 4 NOT DETECTED NOT DETECTED Final   Respiratory Syncytial Virus NOT DETECTED NOT DETECTED Final   Bordetella pertussis NOT DETECTED NOT DETECTED Final   Bordetella Parapertussis NOT DETECTED NOT DETECTED Final   Chlamydophila pneumoniae NOT DETECTED NOT DETECTED Final   Mycoplasma pneumoniae NOT DETECTED NOT DETECTED Final    Comment: Performed at [facility], [place] [address] [place], [place] [address]         Radiology Studies: No results found.      Scheduled Meds:  dexamethasone  1 mg Oral Q breakfast   feeding supplement  237 mL Oral BID BM   mometasone-formoterol  2 puff Inhalation BID   multivitamin with minerals  1 tablet Oral Daily   pantoprazole  40 mg Oral Daily   polyethylene glycol  17 g Oral Daily   Continuous Infusions:  sodium chloride     ceFEPime (MAXIPIME) IV 2 g (06/11/21 0517)    lactated ringers 125 mL/hr at 06/10/21 1739   magnesium sulfate bolus IVPB       LOS: 18 days   Time spent= 35 mins    Arpan Eskelson Arsenio Loader, MD [facility]  If 7PM-7AM, please contact night-coverage  06/11/2021, 7:11 AM

## 2021-06-11 NOTE — TOC Progression Note (Addendum)
Transition of Care [facility]) - Progression Note    Patient Details  Name: Perry Waller MRN: [id number] Date of Birth: 05-24-1961  Transition of Care [facility]) CM/SW Contact  Joanne Chars, LCSW Phone Number: 06/11/2021, 9:52 AM  Clinical Narrative:    CSW spoke with Melissa at [facility].  She asked how often pt is requiring suctioning for trach, also requesting a spare trach when pt is admitted.  They are able to admit pt tomorrow.  CSW spoke with pt in room.  Pt reports he spoke with his sister several times over the weekend, states that she is now saying that she wants him to return home.  CSW has attempted to contact sister on several occasions without response.  CSW attempted to call sister, was able to leave message this time.  Previously voicemail has been full.    1015: Per Mo, RN, pt is requiring suction twice per shift currently.   1020: CSW spoke with [name].  He said he did speak with pt sister Perry Waller and she had indicated that she would call CSW.  He will text her and again ask her to reach out to CSW.  1300: CSW spoke with pt sister Perry Waller.  She states that she has agreed to allow her brother to come back to her home, she does not want him to go to [facility] if he does not want to go.  She is going to need to get a new bed for him.  Discussed need for [facility] RN for the trach, discussed that pt is stable for DC and will most likely DC soon.    1400:  CSW spoke with Perry Waller/[facility] regarding their medicaid service.  He asked for information to be faxed and he will check with [place] office for availability.    1600: CSW confirmed with Fayne Norrie that he had received faxed info.  He will call once he finds out staffing in [place].    Expected Discharge Plan: Skilled Nursing Facility Barriers to Discharge: Continued Medical Work up, SNF Pending bed offer  Expected Discharge Plan and Services Expected Discharge Plan: [place] In-house Referral: Clinical Social Work Discharge Planning Services: CM Consult                                           Social Determinants of Health (SDOH) Interventions    Readmission Risk Interventions No flowsheet data found.

## 2021-06-11 NOTE — Progress Notes (Signed)
Modified Barium Swallow Progress Note  Patient Details  Name: Alphonse Asbridge MRN: [id number] Date of Birth: 15-Jun-1961  Today's Date: 06/11/2021  Modified Barium Swallow completed.  Full report located under Chart Review in the Imaging Section.  Brief recommendations include the following:  Clinical Impression  Pt demonstrates significant improvement in airway protection and strength with swallowing. His oral phase is adequate though there is prolonged swishing of boluses prior to transit. Epiglottic deflection is much improved and pt tolerates nectar thick liquids without impairment. When swallowing larger thin boluses, there is still trace penetration during the swallow due to slightly open vestibule, penetrate trickles through abnormal trachea and is silently aspirated. Cues for normal sized/small sips eliminates most aspiration, but also a preventative throat clear after sips clears airway. Pt was able to complete this strategy x3 with delayed cues. If pt follows precautions he could drink thin liuids and regular solids with low risk of aspiration. Even if he forgets strategy quantity of aspiration would be minimal. Recommend upgraded with f/u from SLP   Swallow Evaluation Recommendations       SLP Diet Recommendations: Regular solids;Thin liquid   Liquid Administration via: Cup;Straw   Medication Administration: Whole meds with liquid   Supervision: Patient able to self feed   Compensations: Slow rate;Small sips/bites;Clear throat after each swallow   Postural Changes: Seated upright at 90 degrees;Remain semi-upright after after feeds/meals (Comment)   Oral Care Recommendations: Oral care BID   Other Recommendations: Place [name] during PO intake    Anatasia Tino, Katherene Ponto 06/11/2021,10:00 AM

## 2021-06-12 DIAGNOSIS — J9509 Other tracheostomy complication: Secondary | ICD-10-CM | POA: Diagnosis not present

## 2021-06-12 DIAGNOSIS — R7989 Other specified abnormal findings of blood chemistry: Secondary | ICD-10-CM | POA: Diagnosis not present

## 2021-06-12 LAB — MAGNESIUM: Magnesium: 1.7 mg/dL (ref 1.7–2.4)

## 2021-06-12 LAB — CBC
HCT: 27.6 % — ABNORMAL LOW (ref 39.0–52.0)
Hemoglobin: 8.6 g/dL — ABNORMAL LOW (ref 13.0–17.0)
MCH: 29 pg (ref 26.0–34.0)
MCHC: 31.2 g/dL (ref 30.0–36.0)
MCV: 92.9 fL (ref 80.0–100.0)
Platelets: 317 10*3/uL (ref 150–400)
RBC: 2.97 MIL/uL — ABNORMAL LOW (ref 4.22–5.81)
RDW: 17 % — ABNORMAL HIGH (ref 11.5–15.5)
WBC: 6.6 10*3/uL (ref 4.0–10.5)
nRBC: 0 % (ref 0.0–0.2)

## 2021-06-12 LAB — COMPREHENSIVE METABOLIC PANEL
ALT: 15 U/L (ref 0–44)
AST: 19 U/L (ref 15–41)
Albumin: 2.2 g/dL — ABNORMAL LOW (ref 3.5–5.0)
Alkaline Phosphatase: 60 U/L (ref 38–126)
Anion gap: 10 (ref 5–15)
BUN: 6 mg/dL (ref 6–20)
CO2: 27 mmol/L (ref 22–32)
Calcium: 8.7 mg/dL — ABNORMAL LOW (ref 8.9–10.3)
Chloride: 97 mmol/L — ABNORMAL LOW (ref 98–111)
Creatinine, Ser: 0.53 mg/dL — ABNORMAL LOW (ref 0.61–1.24)
GFR, Estimated: >60 mL/min (ref >60)
Glucose, Bld: 119 mg/dL — ABNORMAL HIGH (ref 70–99)
Potassium: 3.8 mmol/L (ref 3.5–5.1)
Sodium: 134 mmol/L — ABNORMAL LOW (ref 135–145)
Total Bilirubin: 0.6 mg/dL (ref 0.3–1.2)
Total Protein: 6.2 g/dL — ABNORMAL LOW (ref 6.5–8.1)

## 2021-06-12 MED ORDER — METOPROLOL TARTRATE 12.5 MG HALF TABLET
12.5000 mg | ORAL_TABLET | Freq: Two times a day (BID) | ORAL | Status: DC
  Administered 2021-06-12 – 2021-06-13 (×2): 12.5 mg via ORAL
  Filled 2021-06-12 (×2): qty 1

## 2021-06-12 NOTE — TOC Progression Note (Signed)
Transition of Care [facility]) - Progression Note    Patient Details  Name: Perry Waller MRN: [id number] Date of Birth: February 17, 1961  Transition of Care [facility]) CM/SW Contact  Joanne Chars, LCSW Phone Number: 06/12/2021, 3:26 PM  Clinical Narrative:   CSW spoke with Rob at [facility].  He has spoken to [place] office, they do not have staffing currently but are willing to recruit staff to work with this referral, should be able to start in about 2 weeks.  He is working on paperwork that will require MD signature and will email this over.  CSW spoke with pt sister Henriette.  She was trained to care for pt trach prior to him getting his initial trach two years ago.  She feels comfortable providing trach care, does not need any additional training.  CSW discussed status of [facility] RN process that could begin in 2 weeks.  CSW updated MD on this plan.  CSW spoke with Tommi Rumps at [facility] (different office from [place] office) and he accepted referral for [facility] PT/OT and can initiate services next week.  CSW spoke with Henriette and updated her.  She has to work tomorrow but can pick pt up after 7pm.      Expected Discharge Plan: [place] [facility] Barriers to Discharge: Other (must enter comment) (arranging medicaid home health RN)  Expected Discharge Plan and Services Expected Discharge Plan: [place] In-house Referral: Clinical Social Work Discharge Planning Services: CM Consult                               HH Arranged: PT, OT [place] Agency: [place] Date [place]: 06/12/21 Time [place]: [place] Representative spoke with at [place]: [place] ([place]) Interventions    Readmission Risk Interventions No flowsheet data found.
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
## 2021-06-12 NOTE — Progress Notes (Signed)
  Speech Language Pathology Treatment: Dysphagia  Patient Details Name: Perry Waller MRN: [id number] DOB: [birth year] Today's Date: 06/12/2021 Time: 5427-0623 SLP Time Calculation (min) (ACUTE ONLY): 12 min  Assessment / Plan / Recommendation Clinical Impression  Session to reinforce pt follow through with recommended swallow strategies. Pt reports tolerating "cold" meal. SLP provided verbal and written reminder of strategies prior to PO intake. Takes small sips when sipping liquids, but needed verbal cued for a more forceful and effectively throat clear. Pt return demonstrated repeatedly. SLP reviewed rationale for strategies which pt reported he understood. Will f/u for further observation of tolerance and use of strategies without cueing.   HPI HPI: Perry Waller is a 60 y/o M with history of laryngeal cancer, status post chemo and XRT, status post trach in 2020 and PEG tube. Pt did complete SLP visits in 2021 during radiation but did not attend all sessions and eventually stopped attending though dysphagia was persistent. His last MBS on 01/18/20 recommended regular solids and thin liquids "Cued cough cleared trace aspirates and supraglottic swallow with extended breath hold effective to prevent aspiration." The study also repoted narrow airway. Pt has had subsequent decannulation and removal of PEG tube but presented to radiation oncologist on 05/15/21 and then ENT with stridor, now s/p tracheostomy 05/25/21 and direct laryngoscopy and biopsy. Pt has a persistent tracheocutaneous fistula. During bronchoscopy, pt desaturated and there was difficulty placing a 6 cuffed shiley and concern for damage to the posterior tracheal [name]. A 4 -0 shiley was placed. during laryngoscopy pt was found to have "Extensive fibrinous debris and thick drainage coating and obstructing the supraglottis and glottis, which was easily suctioned. Mild edema and post-radiation changes of the true and false vocal folds and arytenoids. No mass  lesion appreciated." SLPevalauted swallow via MBS on 05/26/21 and recommended Dys 3/honey thick liquids but pt upgraded to regular/honey. Repeat MBS on 6/14 for assess tolerance.      SLP Plan  Continue with current plan of care       Recommendations  Diet recommendations: Regular;Thin liquid Liquids provided via: Cup;Straw Medication Administration: Whole meds with liquid Supervision: Patient able to self feed Compensations: Slow rate;Small sips/bites;Clear throat after each swallow Postural Changes and/or Swallow Maneuvers: Seated upright 90 degrees      Patient may use Passy-Muir Speech Valve: During all waking hours (remove during sleep);During PO intake/meals [name] Supervision: Intermittent         Oral Care Recommendations: Oral care BID Follow up Recommendations: Skilled Nursing facility Plan: Continue with current plan of care       GO              Perry [place], MA [place] Pager [phone] Office [phone]  Perry Waller 06/12/2021, 11:34 AM

## 2021-06-13 ENCOUNTER — Other Ambulatory Visit (HOSPITAL_COMMUNITY): Payer: Self-pay

## 2021-06-13 DIAGNOSIS — J9509 Other tracheostomy complication: Secondary | ICD-10-CM | POA: Diagnosis not present

## 2021-06-13 DIAGNOSIS — E860 Dehydration: Secondary | ICD-10-CM | POA: Diagnosis not present

## 2021-06-13 LAB — CBC
HCT: 26.7 % — ABNORMAL LOW (ref 39.0–52.0)
Hemoglobin: 8.3 g/dL — ABNORMAL LOW (ref 13.0–17.0)
MCH: 28.5 pg (ref 26.0–34.0)
MCHC: 31.1 g/dL (ref 30.0–36.0)
MCV: 91.8 fL (ref 80.0–100.0)
Platelets: 309 10*3/uL (ref 150–400)
RBC: 2.91 MIL/uL — ABNORMAL LOW (ref 4.22–5.81)
RDW: 16.6 % — ABNORMAL HIGH (ref 11.5–15.5)
WBC: 5.1 10*3/uL (ref 4.0–10.5)
nRBC: 0 % (ref 0.0–0.2)

## 2021-06-13 LAB — COMPREHENSIVE METABOLIC PANEL
ALT: 17 U/L (ref 0–44)
AST: 18 U/L (ref 15–41)
Albumin: 2.3 g/dL — ABNORMAL LOW (ref 3.5–5.0)
Alkaline Phosphatase: 76 U/L (ref 38–126)
Anion gap: 8 (ref 5–15)
BUN: 6 mg/dL (ref 6–20)
CO2: 28 mmol/L (ref 22–32)
Calcium: 8.5 mg/dL — ABNORMAL LOW (ref 8.9–10.3)
Chloride: 95 mmol/L — ABNORMAL LOW (ref 98–111)
Creatinine, Ser: 0.48 mg/dL — ABNORMAL LOW (ref 0.61–1.24)
GFR, Estimated: >60 mL/min (ref >60)
Glucose, Bld: 115 mg/dL — ABNORMAL HIGH (ref 70–99)
Potassium: 3.7 mmol/L (ref 3.5–5.1)
Sodium: 131 mmol/L — ABNORMAL LOW (ref 135–145)
Total Bilirubin: 0.5 mg/dL (ref 0.3–1.2)
Total Protein: 6.2 g/dL — ABNORMAL LOW (ref 6.5–8.1)

## 2021-06-13 LAB — MAGNESIUM: Magnesium: 1.7 mg/dL (ref 1.7–2.4)

## 2021-06-13 MED ORDER — CEFDINIR 300 MG PO CAPS
300.0000 mg | ORAL_CAPSULE | Freq: Two times a day (BID) | ORAL | 0 refills | Status: AC
  Filled 2021-06-13: qty 4, 2d supply, fill #0

## 2021-06-13 MED ORDER — METOPROLOL TARTRATE 25 MG PO TABS
12.5000 mg | ORAL_TABLET | Freq: Two times a day (BID) | ORAL | 0 refills | Status: DC
  Filled 2021-06-13: qty 30, 30d supply, fill #0

## 2021-06-13 MED ORDER — DEXAMETHASONE 2 MG PO TABS
1.0000 mg | ORAL_TABLET | Freq: Every day | ORAL | 0 refills | Status: AC
  Filled 2021-06-13: qty 1, 1d supply, fill #0

## 2021-06-13 NOTE — TOC Transition Note (Signed)
Transition of Care [facility]) - CM/SW Discharge Note   Patient Details  Name: Perry Waller MRN: [id number] Date of Birth: Apr 13, 1961  Transition of Care [facility]) CM/SW Contact:  Joanne Chars, LCSW Phone Number: 06/13/2021, 2:59 PM   Clinical Narrative:  Pt discharging home with both [facility] and also [facility] nursing services, which are in process of being authorized.  CSW faxed required paperwork to [employer] at [facility] and confirmed receipt.  DME provided by [employer].  Pt sent home with second trach, per [facility] request, along with supplies, rollator, 3n1 all provided by [employer].  Pt does not have PCP and was set up at the [facility].  Pt also arranged for appt at [facility] trach clinic.  Sister Henriette will be picking pt up sometime after 4pm today.      Final next level of care: Home w Home Health Services Barriers to Discharge: Barriers Resolved   Patient Goals and CMS Choice Patient states their goals for this hospitalization and ongoing recovery are:: agreeable to short term rehab      Discharge Placement                  Name of family member notified: sister Henriette Patient and family notified of of transfer: 06/13/21  Discharge Plan and Services In-house Referral: Clinical Social Work Discharge Planning Services: CM Consult            DME Arranged: Gilford Rile rolling with seat, 3-N-1 DME Agency: [employer] Date DME Agency Contacted: 06/13/21 Time DME Agency Contacted: [place] Representative spoke with at DME Agency: Freda Munro HH Arranged: PT, OT [place] Agency: [place] Date [place]: 06/12/21 Time [place]: [place] Representative spoke with at [place]: [place]) Interventions     Readmission Risk Interventions No flowsheet data found.

## 2021-06-13 NOTE — Progress Notes (Signed)
Occupational Therapy Treatment Patient Details Name: Perry Waller MRN: [id number] DOB: 1961/07/29 Today's Date: 06/13/2021    History of present illness 60 y.o. male presenting on 5/27 from ENT clinic with tracheostomy obstruction. Patient transferred to [facility] for tracheostomy revision. Hospital admission complicated by development of sepsis secondary to UTI and PNA. 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   OT comments  Pt making steady progress towards OT goals this session. Pt received eating breakfast, but pt upset that it was cold, assisted pt with ordering new breakfast tray with pt then agreeable to OOB session. Pt currently requires overall min guard assist for functional ADL transfers with no AD needing most assist for safety and line mgmt. Pt currently requires set- up for UB ADLs and min guard assist for bed mobility. Noted pt now for DC home with his sister, updated DC recs below to reflect change in POC. Will continue to follow acutely per POC.    Follow Up Recommendations  Supervision/Assistance - 24 hour;Home health OT    Equipment Recommendations  3 in 1 bedside commode;Tub/shower seat    Recommendations for Other Services      Precautions / Restrictions Precautions Precautions: Fall Restrictions Weight Bearing Restrictions: No       Mobility Bed Mobility Overal bed mobility: Needs Assistance Bed Mobility: Supine to Sit     Supine to sit: Min guard     General bed mobility comments: no physical asssit needed, min guard for line mgmt    Transfers Overall transfer level: Needs assistance Equipment used: None Transfers: Sit to/from [facility] Sit to Stand: Min guard Stand pivot transfers: Min guard       General transfer comment: min guard for safet to pivot from EOB >recliner, assist for line mgmt and safety    Balance Overall  balance assessment: Needs assistance Sitting-balance support: No upper extremity supported;Feet supported Sitting balance-Leahy Scale: Good     Standing balance support: During functional activity;No upper extremity supported Standing balance-Leahy Scale: Fair                             ADL either performed or assessed with clinical judgement   ADL Overall ADL's : Needs assistance/impaired                 Upper Body Dressing : Set up;Sitting Upper Body Dressing Details (indicate cue type and reason): Set-up assist to don posterior  hospital gown.     Toilet Transfer: Psychologist, sport and exercise Details (indicate cue type and reason): simualted via stand pivot from EOB>recliner with no AD, min guard for safety and line mgmt         Functional mobility during ADLs: Min guard General ADL Comments: pt continues to present with decreased activity tolerance, impaired communication, and generalized deconditioning     Vision       Perception     Praxis      Cognition Arousal/Alertness: Awake/alert Behavior During Therapy: WFL for tasks assessed/performed;Agitated (mild agitation at times) Overall Cognitive Status: Difficult to assess                                 General Comments: pt continues to be difficult to understand at times but able to communicate needs  Exercises     Shoulder Instructions       General Comments pt on 5LPM 28% Trach collar with SpO2 WFL, assisted pt with ordering warm breakfast tray as pt reports his food is too cold to eat    Pertinent Vitals/ Pain       Pain Assessment: No/denies pain  Home Living                                          Prior Functioning/Environment              Frequency  Min 2X/week        Progress Toward Goals  OT Goals(current goals can now be found in the care plan section)  Progress towards OT goals: Progressing toward  goals  Acute Rehab OT Goals Patient Stated Goal: to eat a warm breakfast OT Goal Formulation: With patient Time For Goal Achievement: 06/24/21 (progressed goal date based on last OTR visit) Potential to Achieve Goals: Good  Plan Discharge plan remains appropriate;Discharge plan needs to be updated;Equipment recommendations need to be updated    Co-evaluation                 AM-PAC OT "6 Clicks" Daily Activity     Outcome Measure   Help from another person eating meals?: None Help from another person taking care of personal grooming?: A Little Help from another person toileting, which includes using toliet, bedpan, or urinal?: A Little Help from another person bathing (including washing, rinsing, drying)?: A Little Help from another person to put on and taking off regular upper body clothing?: A Little Help from another person to put on and taking off regular lower body clothing?: A Little 6 Click Score: 19    End of Session Equipment Utilized During Treatment: Oxygen;Other (comment) (5LPM 28% trach collar)  OT Visit Diagnosis: Unsteadiness on feet (R26.81);Muscle weakness (generalized) (M62.81)   Activity Tolerance Patient tolerated treatment well   Patient Left in chair;with call bell/phone within reach;with nursing/sitter in room;Other (comment) (PT entering to work with pt, NT assitng OTA with changing pts bed sheets)   Nurse Communication Mobility status        Time: 4166-0630 OT Time Calculation (min): 27 min  Charges: OT General Charges $OT Visit: 1 Visit OT Treatments $Self Care/Home Management : 23-37 mins  Harley [place]., COTA/L Acute Rehabilitation Services [phone]    Precious Haws 06/13/2021, 11:02 AM

## 2021-06-13 NOTE — Progress Notes (Signed)
Pt ordered to be d/c by provider, pt is alert and oriented, no acute distress noted. Education and d/c instructions given to pt and pts sister, both parties acknowledged understanding

## 2021-06-13 NOTE — Discharge Summary (Signed)
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 0300  WBC 8.7 8.8 6.6 5.1  NEUTROABS 6.6  --   --   --   HGB 8.7* 8.4* 8.6* 8.3*  HCT 28.3* 26.7* 27.6* 26.7*  MCV 95.3 93.7 92.9 91.8  PLT 331 321 317 309   Cardiac Enzymes: No results for input(s): CKTOTAL, CKMB, CKMBINDEX, TROPONINI in the last 168 hours. BNP: Invalid input(s): POCBNP CBG: No results for input(s): GLUCAP in the last 168 hours. D-Dimer No results for input(s): DDIMER in the last 72 hours. Hgb A1c No results for input(s): HGBA1C in the last 72 hours. Lipid Profile No results for input(s): CHOL, HDL, LDLCALC, TRIG, CHOLHDL, LDLDIRECT in the last 72 hours. Thyroid function studies No results for input(s): TSH, T4TOTAL, T3FREE, THYROIDAB in the last 72 hours.  Invalid input(s): FREET3 Anemia work up No results for input(s): VITAMINB12, FOLATE,  FERRITIN, TIBC, IRON, RETICCTPCT in the last 72 hours. Urinalysis    Component Value Date/Time   COLORURINE YELLOW 06/09/2021 0520   APPEARANCEUR CLOUDY (A) 06/09/2021 5462  LABSPEC 1.018 06/09/2021 0520   PHURINE 9.0 (H) 06/09/2021 0520   GLUCOSEU NEGATIVE 06/09/2021 0520   HGBUR NEGATIVE 06/09/2021 0520   BILIRUBINUR NEGATIVE 06/09/2021 0520   KETONESUR NEGATIVE 06/09/2021 0520   PROTEINUR 30 (A) 06/09/2021 0520   UROBILINOGEN 0.2 10/17/2011 2052   NITRITE NEGATIVE 06/09/2021 0520   LEUKOCYTESUR LARGE (A) 06/09/2021 0520   Sepsis Labs Invalid input(s): PROCALCITONIN,  WBC,  LACTICIDVEN Microbiology Recent Results (from the past 240 hour(s))  Culture, Urine     Status: Abnormal   Collection Time: 06/09/21  5:20 AM   Specimen: Urine, Random  Result Value Ref Range Status   Specimen Description URINE, RANDOM  Final   Special Requests   Final    NONE Performed at [facility], [address], [place], [place] [address]    Culture >=100,000 COLONIES/mL PROTEUS MIRABILIS (A)  Final   Report Status 06/11/2021 FINAL  Final   Organism ID, Bacteria PROTEUS MIRABILIS (A)  Final      Susceptibility   Proteus mirabilis - MIC*    AMPICILLIN <=2 SENSITIVE Sensitive     CEFAZOLIN <=4 SENSITIVE Sensitive     CEFEPIME <=0.12 SENSITIVE Sensitive     CEFTRIAXONE <=0.25 SENSITIVE Sensitive     CIPROFLOXACIN <=0.25 SENSITIVE Sensitive     GENTAMICIN <=1 SENSITIVE Sensitive     IMIPENEM 1 SENSITIVE Sensitive     NITROFURANTOIN 128 RESISTANT Resistant     TRIMETH/SULFA <=20 SENSITIVE Sensitive     AMPICILLIN/SULBACTAM <=2 SENSITIVE Sensitive     PIP/TAZO <=4 SENSITIVE Sensitive     * >=100,000 COLONIES/mL PROTEUS MIRABILIS  Culture, blood (x 2)     Status: None (Preliminary result)   Collection Time: 06/09/21  5:31 AM   Specimen: BLOOD  Result Value Ref Range Status   Specimen Description BLOOD LEFT ANTECUBITAL  Final   Special Requests   Final    BOTTLES DRAWN AEROBIC AND  ANAEROBIC Blood Culture adequate volume   Culture   Final    NO GROWTH 4 DAYS Performed at [facility], [address], [place], [place] [address]    Report Status PENDING  Incomplete  Culture, blood (x 2)     Status: None (Preliminary result)   Collection Time: 06/09/21  5:38 AM   Specimen: BLOOD LEFT HAND  Result Value Ref Range Status   Specimen Description BLOOD LEFT HAND  Final   Special Requests   Final    BOTTLES DRAWN AEROBIC ONLY Blood Culture results may not be optimal due to an inadequate volume of blood received in culture bottles   Culture   Final    NO GROWTH 4 DAYS Performed at [facility], [place] [address] [place], [place] [address]    Report Status PENDING  Incomplete  Resp Panel by RT-PCR (Flu A&B, Covid) Nasopharyngeal Swab     Status: None   Collection Time: 06/10/21  8:21 AM   Specimen: Nasopharyngeal Swab; Nasopharyngeal(NP) swabs in vial transport medium  Result Value Ref Range Status   SARS Coronavirus 2 by RT PCR NEGATIVE NEGATIVE Final    Comment: (NOTE) SARS-CoV-2 target nucleic acids are NOT DETECTED.  The SARS-CoV-2 RNA is generally detectable in upper respiratory specimens during the acute phase of infection. The lowest concentration of SARS-CoV-2 viral copies this assay can detect is 138 copies/mL. A negative result does not preclude SARS-Cov-2 infection and should not be used as the sole basis for treatment or other patient management decisions. A negative  result may occur with  improper specimen collection/handling, submission of specimen other than nasopharyngeal swab, presence of viral mutation(s) within the areas targeted by this assay, and inadequate number of viral copies(<138 copies/mL). A negative result must be combined with clinical observations, patient history, and epidemiological information. The expected result is Negative.  Fact Sheet for Patients:  EntrepreneurPulse.com.au  Fact Sheet for  Healthcare Providers:  IncredibleEmployment.be  This test is no t yet approved or cleared by the [place] FDA and  has been authorized for detection and/or diagnosis of SARS-CoV-2 by FDA under an Emergency Use Authorization (EUA). This EUA will remain  in effect (meaning this test can be used) for the duration of the COVID-19 declaration under Section 564(b)(1) of the Act, 21 U.S.C.section 360bbb-3(b)(1), unless the authorization is terminated  or revoked sooner.       Influenza A by PCR NEGATIVE NEGATIVE Final   Influenza B by PCR NEGATIVE NEGATIVE Final    Comment: (NOTE) The Xpert Xpress SARS-CoV-2/FLU/RSV plus assay is intended as an aid in the diagnosis of influenza from Nasopharyngeal swab specimens and should not be used as a sole basis for treatment. Nasal washings and aspirates are unacceptable for Xpert Xpress SARS-CoV-2/FLU/RSV testing.  Fact Sheet for Patients: EntrepreneurPulse.com.au  Fact Sheet for Healthcare Providers: IncredibleEmployment.be  This test is not yet approved or cleared by the [place] FDA and has been authorized for detection and/or diagnosis of SARS-CoV-2 by FDA under an Emergency Use Authorization (EUA). This EUA will remain in effect (meaning this test can be used) for the duration of the COVID-19 declaration under Section 564(b)(1) of the Act, 21 U.S.C. section 360bbb-3(b)(1), unless the authorization is terminated or revoked.  Performed at [facility], [place] [address], [place], [place] [address]   Respiratory (~20 pathogens) panel by PCR     Status: None   Collection Time: 06/10/21  9:21 AM   Specimen: Nasopharyngeal Swab; Respiratory  Result Value Ref Range Status   Adenovirus NOT DETECTED NOT DETECTED Final   Coronavirus 229E NOT DETECTED NOT DETECTED Final    Comment: (NOTE) The Coronavirus on the Respiratory Panel, DOES NOT test for the novel  Coronavirus (2019  nCoV)    Coronavirus HKU1 NOT DETECTED NOT DETECTED Final   Coronavirus NL63 NOT DETECTED NOT DETECTED Final   Coronavirus OC43 NOT DETECTED NOT DETECTED Final   Metapneumovirus NOT DETECTED NOT DETECTED Final   Rhinovirus / Enterovirus NOT DETECTED NOT DETECTED Final   Influenza A NOT DETECTED NOT DETECTED Final   Influenza B NOT DETECTED NOT DETECTED Final   Parainfluenza Virus 1 NOT DETECTED NOT DETECTED Final   Parainfluenza Virus 2 NOT DETECTED NOT DETECTED Final   Parainfluenza Virus 3 NOT DETECTED NOT DETECTED Final   Parainfluenza Virus 4 NOT DETECTED NOT DETECTED Final   Respiratory Syncytial Virus NOT DETECTED NOT DETECTED Final   Bordetella pertussis NOT DETECTED NOT DETECTED Final   Bordetella Parapertussis NOT DETECTED NOT DETECTED Final   Chlamydophila pneumoniae NOT DETECTED NOT DETECTED Final   Mycoplasma pneumoniae NOT DETECTED NOT DETECTED Final    Comment: Performed at [facility], [place]. [address] [place], [place] [address]  SARS CORONAVIRUS 2 (TAT 6-24 HRS) Nasopharyngeal Nasopharyngeal Swab     Status: None   Collection Time: 06/11/21 11:57 AM   Specimen: Nasopharyngeal Swab  Result Value Ref Range Status   SARS Coronavirus 2 NEGATIVE NEGATIVE Final    Comment: (NOTE) SARS-CoV-2 target nucleic acids are NOT DETECTED.  The  SARS-CoV-2 RNA is generally detectable in upper and lower respiratory specimens during the acute phase of infection. Negative results do not preclude SARS-CoV-2 infection, do not rule out co-infections with other pathogens, and should not be used as the sole basis for treatment or other patient management decisions. Negative results must be combined with clinical observations, patient history, and epidemiological information. The expected result is Negative.  Fact Sheet for Patients: SugarRoll.be  Fact Sheet for Healthcare Providers: https://www.woods-mathews.com/  This test is not yet  approved or cleared by the [place] FDA and  has been authorized for detection and/or diagnosis of SARS-CoV-2 by FDA under an Emergency Use Authorization (EUA). This EUA will remain  in effect (meaning this test can be used) for the duration of the COVID-19 declaration under Se ction 564(b)(1) of the Act, 21 U.S.C. section 360bbb-3(b)(1), unless the authorization is terminated or revoked sooner.  Performed at [facility], [place] [address] [place], [place] [address]    Time spent: 30 min  SIGNED:   Marylu Lund, MD  [facility] 06/13/2021, 12:14 PM  If 7PM-7AM, please contact night-coverage

## 2021-06-13 NOTE — Progress Notes (Signed)
Physical Therapy Treatment Patient Details Name: Perry Waller MRN: [id number] DOB: 01-18-1961 Today's Date: 06/13/2021    History of Present Illness 60 y.o. male presenting on 5/27 from ENT clinic with tracheostomy obstruction. Patient transferred to [facility] for tracheostomy revision. Hospital admission complicated by development of sepsis secondary to UTI and PNA. 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    PT Comments    Patient finishing OT on arrival. Slightly irritated/frustrated due to cold breakfast delivered. OT assisted to order new breakfast just prior to PT arrival. Pt agreed to work with PT until his breakfast arrived. Patient showing more appropriate velocity and control of rollator when walking. Transfers continues to need cues/education for safe/proper sequencing with rollator. Noted pt plans to discharge to sister's home now.      Follow Up Recommendations  Home health PT (pt now returning home with family with HHPT)     Equipment Recommendations  Other (comment) (rollator)    Recommendations for Other Services       Precautions / Restrictions Precautions Precautions: Fall    Mobility  Bed Mobility                    Transfers Overall transfer level: Needs assistance Equipment used: 4-wheeled walker Transfers: Sit to/from Stand Sit to Stand: Min guard         General transfer comment: pt unsafe during transfers with rollator (does not lock brakes, holding handles each time, slightly flopping into chair each time he sits  Ambulation/Gait Ambulation/Gait assistance: Min guard Gait Distance (Feet): 240 Feet Assistive device: 4-wheeled walker Gait Pattern/deviations: Step-through pattern;Decreased stride length Gait velocity: improved pace and control of rollator   General Gait Details: proximity to RW improved with fewer cues needed; cues  for uprigght posture; pt with better velocity to match his ability (slower than he has been--which is appropriate)   Chief Strategy Officer    Modified Rankin (Stroke Patients Only)       Balance Overall balance assessment: Needs assistance Sitting-balance support: No upper extremity supported;Feet supported Sitting balance-Leahy Scale: Good     Standing balance support: During functional activity;No upper extremity supported Standing balance-Leahy Scale: Fair                              Cognition Arousal/Alertness: Awake/alert Behavior During Therapy: WFL for tasks assessed/performed (low frustration tolerance)                                   General Comments: good awareness of IV line; appropriate steering of RW--still needs cues for locking an unlocking brakes during transfers      Exercises      General Comments General comments (skin integrity, edema, etc.): pt's breakfast arrived as returning to room; he wanted to eat while breakfast was warm therefore exercies deferred      Pertinent Vitals/Pain Pain Assessment: No/denies pain    Home Living                      Prior Function            PT Goals (current goals can now be found in the care plan section) Acute Rehab PT Goals Patient  Stated Goal: "To get up [place] here" PT Goal Formulation: With patient Time For Goal Achievement: 06/24/21 Potential to Achieve Goals: Good Progress towards PT goals: Progressing toward goals    Frequency    Min 2X/week      PT Plan Discharge plan needs to be updated    Co-evaluation              AM-PAC PT "6 Clicks" Mobility   Outcome Measure  Help needed turning from your back to your side while in a flat bed without using bedrails?: None Help needed moving from lying on your back to sitting on the side of a flat bed without using bedrails?: None Help needed moving to and from a bed to a chair  (including a wheelchair)?: A Little Help needed standing up from a chair using your arms (e.g., wheelchair or bedside chair)?: A Little Help needed to walk in hospital room?: A Little Help needed climbing 3-5 steps with a railing? : A Little 6 Click Score: 20    End of Session   Activity Tolerance: Patient tolerated treatment well Patient left: with call bell/phone within reach;in chair;with chair alarm set   PT Visit Diagnosis: Unsteadiness on feet (R26.81);Muscle weakness (generalized) (M62.81);Difficulty in walking, not elsewhere classified (R26.2)     Time: 9983-3825 PT Time Calculation (min) (ACUTE ONLY): 14 min  Charges:  $Gait Training: 8-22 mins                      Arby Barrette, PT Pager [phone]    Rexanne Mano 06/13/2021, 9:42 AM

## 2021-06-14 LAB — CULTURE, BLOOD (ROUTINE X 2)
Culture: NO GROWTH
Culture: NO GROWTH
Special Requests: ADEQUATE

## 2021-06-14 NOTE — Progress Notes (Addendum)
CSW received call from [facility], pt sister on Friday AM stating that [employer] cannot bring her another suction machine.  CSW spoke with Henriette at 1300.  She reports she gave the previous machine away to someone else they knew who needed it, cannot get it back.  She is willing to set up payment plan.  CSW spoke with Freda Munro at [facility] and she reports it is too soon for insurance to cover another machine.  She can email their financial division who would set up any sort of payment plan, she will let them know pt is discharged and needs this urgently.  Freda Munro agreed to confirm progress later today. Lurline Idol, MSW, LCSW 6/17/20221:22 PM   1520: CSW spoke with Henriette, she had not heard from anyone yet.  1535: CSW attempted to contact Earvin Hansen, spoke with Andee Poles who is covering for him, updated her on situation and she will follow up as well. Lurline Idol, MSW, LCSW 06/14/01/07/2022:38 PM   1545: TC message from [place].  "I'm all set" Lurline Idol, MSW, LCSW 06/14/01/21/2023:50 PM   1600: TC Danielle, [employer].  There is a purchase order in the system and the suction machine/supplies are in process to be delivered. Lurline Idol, MSW, LCSW 6/17/20224:12 PM

## 2021-06-18 ENCOUNTER — Telehealth (INDEPENDENT_AMBULATORY_CARE_PROVIDER_SITE_OTHER): Payer: Self-pay

## 2021-06-18 NOTE — Telephone Encounter (Signed)
Copied from [place] [id number]. Topic: General - Other >> Jun 18, 2021 11:10 AM Leward Quan A wrote: Reason for CRM: Areatha Keas patient sister and care giver called in had some questions about supplies needed by patient asking for a call back from Ms Oletta Lamas or her nurse today please at Ph# [phone]

## 2021-06-22 NOTE — Telephone Encounter (Signed)
Patient has supplies sent/delivered per sister

## 2021-07-08 ENCOUNTER — Other Ambulatory Visit (HOSPITAL_COMMUNITY): Payer: Medicaid Other

## 2021-07-10 ENCOUNTER — Inpatient Hospital Stay (HOSPITAL_COMMUNITY): Admit: 2021-07-10 | Payer: Medicaid Other

## 2021-07-12 ENCOUNTER — Inpatient Hospital Stay (HOSPITAL_COMMUNITY): Admission: RE | Admit: 2021-07-12 | Payer: Medicaid Other | Source: Ambulatory Visit

## 2021-07-16 ENCOUNTER — Other Ambulatory Visit: Payer: Self-pay

## 2021-07-16 ENCOUNTER — Ambulatory Visit: Payer: Medicaid Other | Attending: Family Medicine | Admitting: Family Medicine

## 2021-07-16 ENCOUNTER — Inpatient Hospital Stay (INDEPENDENT_AMBULATORY_CARE_PROVIDER_SITE_OTHER): Payer: Medicaid Other | Admitting: Primary Care

## 2021-07-16 DIAGNOSIS — U071 COVID-19: Secondary | ICD-10-CM

## 2021-07-16 DIAGNOSIS — J432 Centrilobular emphysema: Secondary | ICD-10-CM

## 2021-07-16 DIAGNOSIS — R000 Tachycardia, unspecified: Secondary | ICD-10-CM

## 2021-07-16 DIAGNOSIS — C321 Malignant neoplasm of supraglottis: Secondary | ICD-10-CM | POA: Diagnosis not present

## 2021-07-16 MED ORDER — STIOLTO RESPIMAT 2.5-2.5 MCG/ACT IN AERS: 2.0000 | INHALATION_SPRAY | Freq: Every day | RESPIRATORY_TRACT | 6 refills | Status: DC

## 2021-07-16 MED ORDER — MOLNUPIRAVIR EUA 200MG CAPSULE: 4.0000 | ORAL_CAPSULE | Freq: Two times a day (BID) | ORAL | 0 refills | Status: AC

## 2021-07-16 MED ORDER — IPRATROPIUM-ALBUTEROL 0.5-2.5 (3) MG/3ML IN SOLN
3.0000 mL | Freq: Three times a day (TID) | RESPIRATORY_TRACT | 2 refills | Status: DC
Start: 2021-07-16 — End: 2021-09-30

## 2021-07-16 MED ORDER — METOPROLOL TARTRATE 25 MG PO TABS: 12.5000 mg | ORAL_TABLET | Freq: Two times a day (BID) | ORAL | 0 refills | Status: DC

## 2021-07-16 NOTE — Progress Notes (Signed)
Virtual Visit via Video Note  I connected with Perry Waller, on 07/16/2021 at 3:13 PM by video enabled telemedicine device due to the COVID-19 pandemic and verified that I am speaking with the correct person using two identifiers.   Consent: I discussed the limitations, risks, security and privacy concerns of performing an evaluation and management service by telemedicine and the availability of in person appointments. I also discussed with the patient that there may be a patient responsible charge related to this service. The patient expressed understanding and agreed to proceed.   Location of Patient: Home  Location of Provider: Clinic   Persons participating in Telemedicine visit: Camryn Quesinberry [name] Dickerosn Jeneen Rinks - nurse Dr. 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 During his hospitalization in 04/2021 he developed sepsis secondary to UTI which was treated. He did have tachycardia which was treated with low-dose beta-blocker.  At his visit today he has his home health nurse present as well as his sister who both provide the history.  Tested positive for COVID 5 days ago at [facility].  He is asymptomatic and reports doing well. Systolic BP is usullay in the 110s per nurse but not BP was taken today because the cuff was too large. Is requesting refills of his medications.  Past Medical History:  Diagnosis Date   Acute respiratory acidosis 06/29/2019   Acute respiratory failure with hypoxia (HCC) 06/30/2019   Arthritis    Chronic anemia    baseline hgb 10-12   COPD (chronic obstructive pulmonary disease) ([place])    History of radiation therapy 08/17/19- 10/10/19   Larynx treated with 35 fractions of 2 Gy each to total 70 Gy.    Neck mass 08/05/2019    Allergies  Allergen Reactions   Fish Allergy Other (See Comments)    Breaks out    Current Outpatient Medications on File Prior to Visit  Medication Sig Dispense Refill   acetaminophen (TYLENOL) 160 MG/5ML liquid Take 19.9 mLs (636.8 mg total) by mouth every 4 (four) hours as needed for fever. 120 mL 2   feeding supplement, ENSURE COMPLETE, (ENSURE COMPLETE) LIQD Take 237 mLs by mouth 3 (three) times daily between meals. 21330 mL 0   ipratropium-albuterol (DUONEB) 0.5-2.5 (3) MG/3ML SOLN Take 3 mLs by nebulization in the morning, at noon, and at bedtime. 360 mL 2   metoprolol tartrate (LOPRESSOR) 25 MG tablet Take 0.5 tablets (12.5 mg total) by mouth 2 (two) times daily. 30 tablet 0   naproxen sodium (ALEVE) 220 MG tablet Take 440 mg by mouth daily as needed (pain/headache).     Nutritional Supplements (ENSURE ORIGINAL PO) Take 237 mLs by mouth 6 (six) times daily. chocolate     omeprazole (PRILOSEC) 20 MG capsule Take 2 capsules (40 mg total) by mouth daily. 30 capsule 11   polyethylene glycol powder (GLYCOLAX/MIRALAX) 17 GM/SCOOP powder Take 17 g by mouth 2 (two) times daily as needed. 3350 g 1   Tiotropium Bromide-Olodaterol (STIOLTO RESPIMAT) 2.5-2.5 MCG/ACT AERS Inhale 2 puffs into the lungs daily. 4 g 11   No current facility-administered medications on file prior to visit.    ROS: See HPI  Observations/Objective: HR 120, O2 sat 99%, Awake, alert, oriented x3, chronically ill looking Neck-tracheostomy tube in place Respiration-normal Cardiac-tachycardia Psych-normal mood  Assessment and Plan: 1.  Centrilobular emphysema (HCC) Stable - Tiotropium Bromide-Olodaterol (STIOLTO RESPIMAT) 2.5-2.5 MCG/ACT AERS; Inhale 2 puffs into the lungs daily.  Dispense: 4 g; Refill: 6 - ipratropium-albuterol (DUONEB) 0.5-2.5 (3) MG/3ML SOLN; Take 3 mLs by nebulization in the morning, at noon, and at bedtime.  Dispense: 360 mL; Refill: 2  2. COVID-19 virus infection He is asymptomatic and  oxygen saturation is normal but he is tachycardic Counseled on risks and benefits of oral antiviral for COVID-19 and after shared decision making he would like to proceed with Molnupiravir Discussed isolation protocol. - molnupiravir EUA 200 mg CAPS; Take 4 capsules (800 mg total) by mouth 2 (two) times daily for 5 days.  Dispense: 40 capsule; Refill: 0  3. Malignant neoplasm of supraglottis ([place]) Status post chemo and radiation Status post tracheostomy Followed by ENT  4. Tachycardia He is tachycardic today Advised nurse to obtain a blood pressure and call the clinic with this number.  If blood pressure is holding up well consider increasing metoprolol dose - metoprolol tartrate (LOPRESSOR) 25 MG tablet; Take 0.5 tablets (12.5 mg total) by mouth 2 (two) times daily.  Dispense: 30 tablet; Refill: 0   Follow Up Instructions: 3 months   I discussed the assessment and treatment plan with the patient. The patient was provided an opportunity to ask questions and all were answered. The patient agreed with the plan and demonstrated an understanding of the instructions.   The patient was advised to call back or seek an in-person evaluation if the symptoms worsen or if the condition fails to improve as anticipated.     I provided 28 minutes total of Telehealth time during this encounter including median intraservice time, reviewing previous notes, investigations, ordering medications, medical decision making, coordinating care and patient verbalized understanding at the end of the visit.     Charlott Rakes, MD, FAAFP. [facility] [place], [place]   07/16/2021, 3:13 PM

## 2021-07-17 ENCOUNTER — Encounter: Payer: Self-pay | Admitting: Family Medicine

## 2021-08-22 ENCOUNTER — Other Ambulatory Visit: Payer: Self-pay

## 2021-08-22 ENCOUNTER — Ambulatory Visit (HOSPITAL_COMMUNITY)
Admission: RE | Admit: 2021-08-22 | Discharge: 2021-08-22 | Disposition: A | Discharge status: Home / Self Care | Payer: Medicaid Other | Source: Ambulatory Visit | Attending: Acute Care | Admitting: Acute Care

## 2021-08-22 DIAGNOSIS — Z4682 Encounter for fitting and adjustment of non-vascular catheter: Secondary | ICD-10-CM | POA: Diagnosis not present

## 2021-08-22 DIAGNOSIS — Z930 Tracheostomy status: Secondary | ICD-10-CM | POA: Diagnosis not present

## 2021-08-22 NOTE — Progress Notes (Signed)
[facility]   Reason for visit:  Planned trach change  HPI:  Known to me. Has known h/o Head/neck cancer (SCC or larynx) for which he is trach dependent. He presents today for planned trach change. Is accompanied by sister and his [place]. His only complaint is voiced by his RN who reports at times [place] removes his inner cannula and the trach actually gets occluded to the point he can't place the inner cannula back unless he cleans it out (will typically have thick dried secretions) ROS  General ROS: negative for - fatigue, fever, night sweats, or weight loss ENT ROS: negative for - nasal congestion, nasal discharge, sinus pain, sneezing, sore throat, or trach site looks good Allergy and Immunology ROS: negative Respiratory ROS: no cough, shortness of breath, or wheezing Cardiovascular ROS: no chest pain or dyspnea on exertion Gastrointestinal ROS: no abdominal pain, change in bowel habits, or black or bloody stools Musculoskeletal ROS: negative Neurological ROS: no TIA or stroke symptoms Vital signs:  Reviewed: Pulse Ox mid 90s.  Exam:  no distress ENT exam normal, no neck nodes or sinus tenderness, throat normal without erythema or exudate, and airway not compromised clear to auscultation bilaterally; voice very hoarse.  regular rate and rhythm Abdomen soft and nontender without distention, masses , no wound infection noted. 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   [name] ACNP-BC [place]

## 2021-08-22 NOTE — Progress Notes (Signed)
Tracheostomy Procedure Note  Reynold Mantell [id number] [birth year]  Pre Procedure Tracheostomy Information  Trach Brand: Shiley [id number] Size:  4.0 Style: Uncuffed Secured by: Velcro   Procedure: Trach change and Trach cleaning    Post Procedure Tracheostomy Information  Trach BrandCristy Hilts [id number] Size: 4.0 Style: Uncuffed Secured by: Velcro   Post Procedure Evaluation:  ETCO2 positive color change from yellow to purple : Yes.   Vital signs:VSS,  Patients current condition: stable Complications: No apparent complications Trach site exam: clean and dry Wound care done: 4 x 4 gauze  Patient did tolerate procedure well.   Education: none  Prescription needs: none    Additional needs: Patient given new PMV today

## 2021-08-23 ENCOUNTER — Other Ambulatory Visit: Payer: Self-pay | Admitting: *Deleted

## 2021-08-23 DIAGNOSIS — Z430 Encounter for attention to tracheostomy: Secondary | ICD-10-CM

## 2021-08-23 NOTE — Progress Notes (Signed)
DME order placed.  Nothing further at this time.   [name], NP  P Lb[facility] Triage Pool Hey guys   This guy has [employer] for his DME   Need to set him up with aerosol trach collar 21% oxygen (room air) for humidification at [facility]

## 2021-09-20 ENCOUNTER — Other Ambulatory Visit (HOSPITAL_COMMUNITY): Payer: Self-pay

## 2021-09-24 ENCOUNTER — Telehealth: Payer: Self-pay | Admitting: Family Medicine

## 2021-09-24 ENCOUNTER — Telehealth: Payer: Self-pay | Admitting: *Deleted

## 2021-09-24 ENCOUNTER — Ambulatory Visit
Admission: RE | Admit: 2021-09-24 | Discharge: 2021-09-24 | Disposition: A | Discharge status: Home / Self Care | Payer: Medicaid Other | Source: Ambulatory Visit | Attending: Radiation Oncology | Admitting: Radiation Oncology

## 2021-09-24 ENCOUNTER — Encounter: Payer: Self-pay | Admitting: Nutrition

## 2021-09-24 ENCOUNTER — Other Ambulatory Visit: Payer: Self-pay

## 2021-09-24 ENCOUNTER — Encounter: Payer: Self-pay | Admitting: Radiation Oncology

## 2021-09-24 VITALS — Temp 97.8°F | Resp 20 | Ht 69.0 in | Wt 86.6 lb

## 2021-09-24 DIAGNOSIS — M6281 Muscle weakness (generalized): Secondary | ICD-10-CM | POA: Insufficient documentation

## 2021-09-24 DIAGNOSIS — R634 Abnormal weight loss: Secondary | ICD-10-CM | POA: Insufficient documentation

## 2021-09-24 DIAGNOSIS — Z930 Tracheostomy status: Secondary | ICD-10-CM | POA: Insufficient documentation

## 2021-09-24 DIAGNOSIS — Z79899 Other long term (current) drug therapy: Secondary | ICD-10-CM | POA: Insufficient documentation

## 2021-09-24 DIAGNOSIS — Z1329 Encounter for screening for other suspected endocrine disorder: Secondary | ICD-10-CM

## 2021-09-24 DIAGNOSIS — C321 Malignant neoplasm of supraglottis: Secondary | ICD-10-CM | POA: Insufficient documentation

## 2021-09-24 DIAGNOSIS — R059 Cough, unspecified: Secondary | ICD-10-CM | POA: Insufficient documentation

## 2021-09-24 DIAGNOSIS — Z8521 Personal history of malignant neoplasm of larynx: Secondary | ICD-10-CM | POA: Diagnosis not present

## 2021-09-24 DIAGNOSIS — Z923 Personal history of irradiation: Secondary | ICD-10-CM | POA: Insufficient documentation

## 2021-09-24 DIAGNOSIS — R946 Abnormal results of thyroid function studies: Secondary | ICD-10-CM | POA: Diagnosis not present

## 2021-09-24 LAB — T4, FREE: Free T4: 0.71 ng/dL (ref 0.61–1.12)

## 2021-09-24 LAB — TSH: TSH: 4.736 u[IU]/mL — ABNORMAL HIGH (ref 0.320–4.118)

## 2021-09-24 MED ORDER — OXYMETAZOLINE HCL 0.05 % NA SOLN
2.0000 | Freq: Once | NASAL | Status: AC
  Administered 2021-09-24: 2 via NASAL
  Filled 2021-09-24: qty 30

## 2021-09-24 NOTE — Progress Notes (Signed)
Provided 1 complementary case of Ensure Plus.

## 2021-09-24 NOTE — Telephone Encounter (Signed)
CALLED PATIENT TO ASK ABOUT COMING FOR LAB @ 2PM TODAY, SPOKE WITH PATIENT'S SISTER,[name] AND SHE AGREED TO HAVE HER BROTHER HERE @ 2 PM TODAY FOR THIS LAB

## 2021-09-24 NOTE — Telephone Encounter (Signed)
Copied from [place] [id number]. Topic: General - Other >> Sep 20, 2021 11:08 AM Valere Dross wrote: Reason for CRM: Rise Paganini called in from [facility] for a fom to be signed by provider and stated they are still waiting.

## 2021-09-24 NOTE — Progress Notes (Signed)
[name] presents for follow up of radiation to his larynx completed on 10/10/2019.  Pain issues, if any: 8/10 bilateral extremities (hands & feet) Using a feeding tube?: N/A Weight changes, if any: lost 20lbs x3 months  Swallowing issues, if any: No Smoking or chewing tobacco? No Using fluoride trays daily? N/A--dental concerns? Last ENT visit was on: 06/28/2021 Saw Dr. Izora Gala: "Here for follow-up after hospitalization for tracheostomy. He refused a gastrostomy feeding tube during the hospitalization. He is doing much better with his trach tube. He has a #4. He has a speaking valve. He is able to whisper. He has a lot of leakage. He has a lot of coughing when he eats or drinks. He did not have any frank aspiration on the last swallowing evaluation. He is eating a regular diet now. On exam he is very thin but appears stronger than he did last time I saw him. Tracheostomy is in place. I changed the dressing underneath the shield. The trach tube is well-seated. No other new masses. Stable with tracheostomy. Continue regular diet. We talked about the only other option would be a gastrostomy feeding tube but he is adamant that he does not want that. As long as he is not having aspiration pneumonia there is no concern. Follow-up as needed."  Other notable issues, if any: Overall muscle weakness.

## 2021-09-25 NOTE — Progress Notes (Addendum)
Radiation Oncology         [phone]) [phone] ________________________________  Name: Perry Waller MRN: [id number]  Date: 09/24/2021  DOB: 02-Jan-1961  Follow-Up Visit in person OUTPATIENT   CC: Charlott Rakes, MD  [name], MD  Diagnosis and Prior Radiotherapy:          ICD-10-CM   1. Malignant neoplasm of supraglottis (HCC)  C32.1 oxymetazoline (AFRIN) 0.05 % nasal spray 2 spray    Fiberoptic laryngoscopy     Cancer Staging Malignant neoplasm of supraglottis ([name]) Staging form: Larynx - Supraglottis, AJCC 8th Edition - Clinical stage from 08/09/2019: Stage IVA (cT4a, cN2c, cM0) - Signed by Eppie Gibson, MD on 08/10/2019 Stage prefix: Initial diagnosis   08/17/2019 through 10/10/2019 Site Technique Total Dose (Gy) Dose per Fx (Gy) Completed Fx Beam Energies  Head & neck: HN_larynx IMRT 70/70 2 35/35 6X    CHIEF COMPLAINT:  Here for follow-up and surveillance of laryngeal cancer  NARRATIVE:    Perry Waller presents for follow up of radiation to his larynx completed on 10/10/2019.  Wt Readings from Last 3 Encounters:  09/24/21 86 lb 9.6 oz (39.3 kg)  06/13/21 100 lb 15.5 oz (45.8 kg)  05/20/21 83 lb 6 oz (37.8 kg)   Taking all food by mouth - declined PEG tube.  Swallowing issues, if any: No Smoking or chewing tobacco? No Using fluoride trays daily? N/A--dental concerns? Last ENT visit was on: 06/28/2021 Saw Dr. Izora Gala: "Here for follow-up after hospitalization for tracheostomy. He refused a gastrostomy feeding tube during the hospitalization. He is doing much better with his trach tube. He has a #4. He has a speaking valve. He is able to whisper. He has a lot of leakage. He has a lot of coughing when he eats or drinks. He did not have any frank aspiration on the last swallowing evaluation. He is eating a regular diet now. On exam he is very thin but appears stronger than he did last time I saw him. Tracheostomy is in place. I changed the dressing underneath the shield.  The trach tube is well-seated. No other new masses. Stable with tracheostomy. Continue regular diet. We talked about the only other option would be a gastrostomy feeding tube but he is adamant that he does not want that. As long as he is not having aspiration pneumonia there is no concern. Follow-up as needed."   Other notable issues, if any: Overall muscle weakness.   He has home health, they provide trach care.  Sister is present today with him.  Path from larynx bx was negative since previous f/u with me.   ALLERGIES:  is allergic to fish allergy.  Meds: Current Outpatient Medications  Medication Sig Dispense Refill   acetaminophen (TYLENOL) 160 MG/5ML liquid Take 19.9 mLs (636.8 mg total) by mouth every 4 (four) hours as needed for fever. 120 mL 2   feeding supplement, ENSURE COMPLETE, (ENSURE COMPLETE) LIQD Take 237 mLs by mouth 3 (three) times daily between meals. 21330 mL 0   ipratropium-albuterol (DUONEB) 0.5-2.5 (3) MG/3ML SOLN Take 3 mLs by nebulization in the morning, at noon, and at bedtime. 360 mL 2   metoprolol tartrate (LOPRESSOR) 25 MG tablet Take 0.5 tablets (12.5 mg total) by mouth 2 (two) times daily. 30 tablet 0   naproxen sodium (ALEVE) 220 MG tablet Take 440 mg by mouth daily as needed (pain/headache).     Nutritional Supplements (ENSURE ORIGINAL PO) Take 237 mLs by mouth 6 (six) times daily. chocolate  omeprazole (PRILOSEC) 20 MG capsule Take 2 capsules (40 mg total) by mouth daily. 30 capsule 11   polyethylene glycol powder (GLYCOLAX/MIRALAX) 17 GM/SCOOP powder Take 17 g by mouth 2 (two) times daily as needed. 3350 g 1   Tiotropium Bromide-Olodaterol (STIOLTO RESPIMAT) 2.5-2.5 MCG/ACT AERS Inhale 2 puffs into the lungs daily. 4 g 6   No current facility-administered medications for this encounter.    Physical Findings: The patient is in no acute distress.   Wt Readings from Last 3 Encounters:  09/24/21 86 lb 9.6 oz (39.3 kg)  06/13/21 100 lb 15.5 oz (45.8  kg)  05/20/21 83 lb 6 oz (37.8 kg)    height is 5\' 9"  (1.753 m) and weight is 86 lb 9.6 oz (39.3 kg). His temporal temperature is 97.8 F (36.6 C). His respiration is 20 and oxygen saturation is 99%. .  General: Alert and oriented, In a wheelchair.  Cachectic HEENT: Temporal wasting. Oropharynx is clear. Neck: Neck is supple, no palpable cervical or supraclavicular lymphadenopathy.  Trach in place, no sign of tumor at tracheostomy Heart: Regular in rate and rhythm with no murmurs, rubs, or gallops. Chest: CTAB Lymphatics: see Neck Exam Skin: No concerning lesions. Musculoskeletal: Diffuse muscle wasting   PROCEDURE NOTE: After obtaining consent and anesthetizing the nasal cavity with topical oxymetazoline, the flexible endoscope was introduced and passed through the nasal cavity.  The nasopharynx, oropharynx, larynx, and hypopharynx were then examined.  Still much of the laryngeal tissue seems to obstruct the upper airway. The airway is stenotic but not obstructed.  These anatomic findings are stable. There appears resolution of the previous yellow brown crusting.  Copious secretions.  See photo     Lab Findings: Lab Results  Component Value Date   WBC 5.1 06/13/2021   HGB 8.3 (L) 06/13/2021   HCT 26.7 (L) 06/13/2021   MCV 91.8 06/13/2021   PLT 309 06/13/2021    Lab Results  Component Value Date   TSH 4.736 (H) 09/24/2021    Radiographic Findings: No results found.  Impression/Plan:    This is a very pleasant 60 year old gentleman with a history of  failure to thrive and locally advanced laryngeal cancer.     Airway issues resolved with interval placement of trach tube.  Tolerating this well. Bx of larynx was negative.  Thyroid function:  Today TSH was slightly elevated but free T4 was 0.71, normal.  Continue to follow Lab Results  Component Value Date   TSH 4.736 (H) 09/24/2021    Weight loss:  continue to follow w/ Nutrition as appropriate, sees them today.   I  will see him back in  3mo.  He will see Dr. Constance Holster in 45mo. He is pleased with this plan and knows to call if he has any issues in the interim.  On date of service, in total, I spent 35 minutes on this encounter. Patient was seen in person.   _____________________________________   Eppie Gibson, MD

## 2021-09-25 NOTE — Telephone Encounter (Signed)
Called placed to [facility] I spoke with Amy she informed me that Rise Paganini was out of office for the day and she will pass the message to return call.

## 2021-09-26 ENCOUNTER — Encounter: Payer: Self-pay | Admitting: Radiation Oncology

## 2021-09-30 ENCOUNTER — Ambulatory Visit: Payer: Medicaid Other | Attending: Family Medicine | Admitting: Family Medicine

## 2021-09-30 ENCOUNTER — Other Ambulatory Visit: Payer: Self-pay

## 2021-09-30 VITALS — BP 100/71 | HR 118 | Ht 68.0 in | Wt 85.4 lb

## 2021-09-30 DIAGNOSIS — C321 Malignant neoplasm of supraglottis: Secondary | ICD-10-CM | POA: Diagnosis not present

## 2021-09-30 DIAGNOSIS — R000 Tachycardia, unspecified: Secondary | ICD-10-CM | POA: Diagnosis not present

## 2021-09-30 DIAGNOSIS — E46 Unspecified protein-calorie malnutrition: Secondary | ICD-10-CM | POA: Diagnosis not present

## 2021-09-30 DIAGNOSIS — K117 Disturbances of salivary secretion: Secondary | ICD-10-CM | POA: Diagnosis not present

## 2021-09-30 DIAGNOSIS — G629 Polyneuropathy, unspecified: Secondary | ICD-10-CM

## 2021-09-30 DIAGNOSIS — J432 Centrilobular emphysema: Secondary | ICD-10-CM

## 2021-09-30 DIAGNOSIS — I9589 Other hypotension: Secondary | ICD-10-CM

## 2021-09-30 MED ORDER — IPRATROPIUM-ALBUTEROL 0.5-2.5 (3) MG/3ML IN SOLN: 3.0000 mL | Freq: Three times a day (TID) | RESPIRATORY_TRACT | 6 refills | Status: AC

## 2021-09-30 MED ORDER — MIRTAZAPINE 15 MG PO TABS: 15.0000 mg | ORAL_TABLET | Freq: Every day | ORAL | 3 refills | Status: AC

## 2021-09-30 MED ORDER — GLYCOPYRROLATE 1 MG/5ML PO SOLN: 5.0000 mL | Freq: Three times a day (TID) | ORAL | 1 refills | Status: AC | PRN

## 2021-09-30 MED ORDER — MIDODRINE HCL 5 MG PO TABS: 5.0000 mg | ORAL_TABLET | Freq: Three times a day (TID) | ORAL | 3 refills | Status: DC

## 2021-09-30 MED ORDER — GABAPENTIN 300 MG PO CAPS: 300.0000 mg | ORAL_CAPSULE | Freq: Two times a day (BID) | ORAL | 3 refills | Status: DC

## 2021-09-30 MED ORDER — METOPROLOL TARTRATE 25 MG PO TABS: 12.5000 mg | ORAL_TABLET | Freq: Two times a day (BID) | ORAL | 6 refills | Status: AC

## 2021-09-30 NOTE — Progress Notes (Signed)
Has numbness in hands and feet.  Constant spitting.
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
## 2021-09-30 NOTE — Patient Instructions (Signed)
Neuropathic Pain Neuropathic pain is pain caused by damage to the nerves that are responsible for certain sensations in your body (sensory nerves). The pain can be caused by: Damage to the sensory nerves that send signals to your spinal cord and brain (peripheral nervous system). Damage to the sensory nerves in your brain or spinal cord (central nervous system). Neuropathic pain can make you more sensitive to pain. Even a minor sensation can feel very painful. This is usually a long-term condition that can be difficult to treat. The type of pain differs from person to person. It may: Start suddenly (acute), or it may develop slowly and last for a long time (chronic). Come and go as damaged nerves heal, or it may stay at the same level for years. Cause emotional distress, loss of sleep, and a lower quality of life. What are the causes? The most common cause of this condition is diabetes. Many other diseases and conditions can also cause neuropathic pain. Causes of neuropathic pain can be classified as: Toxic. This is caused by medicines and chemicals. The most common cause of toxic neuropathic pain is damage from cancer treatments (chemotherapy). Metabolic. This can be caused by: Diabetes. This is the most common disease that damages the nerves. Lack of vitamin B from long-term alcohol abuse. Traumatic. Any injury that cuts, crushes, or stretches a nerve can cause damage and pain. A common example is feeling pain after losing an arm or leg (phantom limb pain). Compression-related. If a sensory nerve gets trapped or compressed for a long period of time, the blood supply to the nerve can be cut off. Vascular. Many blood vessel diseases can cause neuropathic pain by decreasing blood supply and oxygen to nerves. Autoimmune. This type of pain results from diseases in which the body's defense system (immune system) mistakenly attacks sensory nerves. Examples of autoimmune diseases that can cause  neuropathic pain include lupus and multiple sclerosis. Infectious. Many types of viral infections can damage sensory nerves and cause pain. Shingles infection is a common cause of this type of pain. Inherited. Neuropathic pain can be a symptom of many diseases that are passed down through families (genetic). What increases the risk? You are more likely to develop this condition if: You have diabetes. You smoke. You drink too much alcohol. You are taking certain medicines, including medicines that kill cancer cells (chemotherapy) or that treat immune system disorders. What are the signs or symptoms? The main symptom is pain. Neuropathic pain is often described as: Burning. Shock-like. Stinging. Hot or cold. Itching. How is this diagnosed? No single test can diagnose neuropathic pain. It is diagnosed based on: Physical exam and your symptoms. Your health care provider will ask you about your pain. You may be asked to use a pain scale to describe how bad your pain is. Tests. These may be done to see if you have a high sensitivity to pain and to help find the cause and location of any sensory nerve damage. They include: Nerve conduction studies to test how well nerve signals travel through your sensory nerves (electrodiagnostic testing). Stimulating your sensory nerves through electrodes on your skin and measuring the response in your spinal cord and brain (somatosensory evoked potential). Imaging studies, such as: X-rays. CT scan. MRI. How is this treated? Treatment for neuropathic pain may change over time. You may need to try different treatment options or a combination of treatments. Some options include: Treating the underlying cause of the neuropathy, such as diabetes, kidney disease, or vitamin   deficiencies. Stopping medicines that can cause neuropathy, such as chemotherapy. Medicine to relieve pain. Medicines may include: Prescription or over-the-counter pain  medicine. Anti-seizure medicine. Antidepressant medicines. Pain-relieving patches that are applied to painful areas of skin. A medicine to numb the area (local anesthetic), which can be injected as a nerve block. Transcutaneous nerve stimulation. This uses electrical currents to block painful nerve signals. The treatment is painless. Alternative treatments, such as: Acupuncture. Meditation. Massage. Physical therapy. Pain management programs. Counseling. Follow these instructions at home: Medicines  Take over-the-counter and prescription medicines only as told by your health care provider. Do not drive or use heavy machinery while taking prescription pain medicine. If you are taking prescription pain medicine, take actions to prevent or treat constipation. Your health care provider may recommend that you: Drink enough fluid to keep your urine pale yellow. Eat foods that are high in fiber, such as fresh fruits and vegetables, whole grains, and beans. Limit foods that are high in fat and processed sugars, such as fried or sweet foods. Take an over-the-counter or prescription medicine for constipation. Lifestyle  Have a good support system at home. Consider joining a chronic pain support group. Do not use any products that contain nicotine or tobacco, such as cigarettes and e-cigarettes. If you need help quitting, ask your health care provider. Do not drink alcohol. General instructions Learn as much as you can about your condition. Work closely with all your health care providers to find the treatment plan that works best for you. Ask your health care provider what activities are safe for you. Keep all follow-up visits as told by your health care provider. This is important. Contact a health care provider if: Your pain treatments are not working. You are having side effects from your medicines. You are struggling with tiredness (fatigue), mood changes, depression, or  anxiety. Summary Neuropathic pain is pain caused by damage to the nerves that are responsible for certain sensations in your body (sensory nerves). Neuropathic pain may come and go as damaged nerves heal, or it may stay at the same level for years. Neuropathic pain is usually a long-term condition that can be difficult to treat. Consider joining a chronic pain support group. This information is not intended to replace advice given to you by your health care provider. Make sure you discuss any questions you have with your health care provider. Document Revised: 04/07/2019 Document Reviewed: 01/01/2018 Elsevier Patient Education  2022 [employer] Inc[employer]  

## 2021-10-01 ENCOUNTER — Encounter: Payer: Self-pay | Admitting: Family Medicine

## 2021-10-03 DIAGNOSIS — H524 Presbyopia: Secondary | ICD-10-CM | POA: Diagnosis not present

## 2021-10-03 DIAGNOSIS — H52222 Regular astigmatism, left eye: Secondary | ICD-10-CM | POA: Diagnosis not present

## 2021-10-11 ENCOUNTER — Other Ambulatory Visit: Payer: Self-pay

## 2021-10-11 ENCOUNTER — Emergency Department (HOSPITAL_COMMUNITY): Payer: Medicaid Other

## 2021-10-11 ENCOUNTER — Inpatient Hospital Stay (HOSPITAL_COMMUNITY)
Admission: EM | Admit: 2021-10-11 | Discharge: 2021-10-14 | DRG: 177 | Disposition: A | Discharge status: Home Health | Payer: Medicaid Other | Attending: Student | Admitting: Student

## 2021-10-11 ENCOUNTER — Encounter (HOSPITAL_COMMUNITY): Payer: Self-pay

## 2021-10-11 DIAGNOSIS — Z66 Do not resuscitate: Secondary | ICD-10-CM | POA: Diagnosis present

## 2021-10-11 DIAGNOSIS — I951 Orthostatic hypotension: Secondary | ICD-10-CM | POA: Diagnosis present

## 2021-10-11 DIAGNOSIS — M1990 Unspecified osteoarthritis, unspecified site: Secondary | ICD-10-CM | POA: Diagnosis present

## 2021-10-11 DIAGNOSIS — D649 Anemia, unspecified: Secondary | ICD-10-CM | POA: Diagnosis present

## 2021-10-11 DIAGNOSIS — Z9002 Acquired absence of larynx: Secondary | ICD-10-CM

## 2021-10-11 DIAGNOSIS — Z20822 Contact with and (suspected) exposure to covid-19: Secondary | ICD-10-CM | POA: Diagnosis present

## 2021-10-11 DIAGNOSIS — J690 Pneumonitis due to inhalation of food and vomit: Secondary | ICD-10-CM

## 2021-10-11 DIAGNOSIS — I9589 Other hypotension: Secondary | ICD-10-CM

## 2021-10-11 DIAGNOSIS — R55 Syncope and collapse: Secondary | ICD-10-CM

## 2021-10-11 DIAGNOSIS — Z681 Body mass index (BMI) 19 or less, adult: Secondary | ICD-10-CM

## 2021-10-11 DIAGNOSIS — E860 Dehydration: Secondary | ICD-10-CM | POA: Diagnosis present

## 2021-10-11 DIAGNOSIS — J432 Centrilobular emphysema: Secondary | ICD-10-CM | POA: Diagnosis present

## 2021-10-11 DIAGNOSIS — Z79899 Other long term (current) drug therapy: Secondary | ICD-10-CM

## 2021-10-11 DIAGNOSIS — Z923 Personal history of irradiation: Secondary | ICD-10-CM

## 2021-10-11 DIAGNOSIS — Z91013 Allergy to seafood: Secondary | ICD-10-CM

## 2021-10-11 DIAGNOSIS — Z930 Tracheostomy status: Secondary | ICD-10-CM

## 2021-10-11 DIAGNOSIS — K5900 Constipation, unspecified: Secondary | ICD-10-CM

## 2021-10-11 DIAGNOSIS — F17210 Nicotine dependence, cigarettes, uncomplicated: Secondary | ICD-10-CM | POA: Diagnosis present

## 2021-10-11 DIAGNOSIS — Z720 Tobacco use: Secondary | ICD-10-CM | POA: Diagnosis present

## 2021-10-11 DIAGNOSIS — D638 Anemia in other chronic diseases classified elsewhere: Secondary | ICD-10-CM | POA: Diagnosis present

## 2021-10-11 DIAGNOSIS — E43 Unspecified severe protein-calorie malnutrition: Secondary | ICD-10-CM | POA: Diagnosis present

## 2021-10-11 DIAGNOSIS — Z8521 Personal history of malignant neoplasm of larynx: Secondary | ICD-10-CM

## 2021-10-11 LAB — COMPREHENSIVE METABOLIC PANEL
ALT: 9 U/L (ref 0–44)
AST: 18 U/L (ref 15–41)
Albumin: 2.5 g/dL — ABNORMAL LOW (ref 3.5–5.0)
Alkaline Phosphatase: 72 U/L (ref 38–126)
Anion gap: 10 (ref 5–15)
BUN: 9 mg/dL (ref 6–20)
CO2: 26 mmol/L (ref 22–32)
Calcium: 8.7 mg/dL — ABNORMAL LOW (ref 8.9–10.3)
Chloride: 99 mmol/L (ref 98–111)
Creatinine, Ser: 0.77 mg/dL (ref 0.61–1.24)
GFR, Estimated: >60 mL/min (ref >60)
Glucose, Bld: 113 mg/dL — ABNORMAL HIGH (ref 70–99)
Potassium: 3.7 mmol/L (ref 3.5–5.1)
Sodium: 135 mmol/L (ref 135–145)
Total Bilirubin: 0.4 mg/dL (ref 0.3–1.2)
Total Protein: 7.4 g/dL (ref 6.5–8.1)

## 2021-10-11 LAB — CBC WITH DIFFERENTIAL/PLATELET
Abs Immature Granulocytes: 0.09 10*3/uL — ABNORMAL HIGH (ref 0.00–0.07)
Basophils Absolute: 0.1 10*3/uL (ref 0.0–0.1)
Basophils Relative: 1 %
Eosinophils Absolute: 0.2 10*3/uL (ref 0.0–0.5)
Eosinophils Relative: 2 %
HCT: 30.7 % — ABNORMAL LOW (ref 39.0–52.0)
Hemoglobin: 9.1 g/dL — ABNORMAL LOW (ref 13.0–17.0)
Immature Granulocytes: 1 %
Lymphocytes Relative: 12 %
Lymphs Abs: 1 10*3/uL (ref 0.7–4.0)
MCH: 25.7 pg — ABNORMAL LOW (ref 26.0–34.0)
MCHC: 29.6 g/dL — ABNORMAL LOW (ref 30.0–36.0)
MCV: 86.7 fL (ref 80.0–100.0)
Monocytes Absolute: 0.7 10*3/uL (ref 0.1–1.0)
Monocytes Relative: 8 %
Neutro Abs: 6.4 10*3/uL (ref 1.7–7.7)
Neutrophils Relative %: 76 %
Platelets: 453 10*3/uL — ABNORMAL HIGH (ref 150–400)
RBC: 3.54 MIL/uL — ABNORMAL LOW (ref 4.22–5.81)
RDW: 15.7 % — ABNORMAL HIGH (ref 11.5–15.5)
WBC: 8.4 10*3/uL (ref 4.0–10.5)
nRBC: 0 % (ref 0.0–0.2)

## 2021-10-11 LAB — LACTIC ACID, PLASMA: Lactic Acid, Venous: 2.9 mmol/L (ref 0.5–1.9)

## 2021-10-11 MED ORDER — SODIUM CHLORIDE 0.9 % IV SOLN
500.0000 mg | INTRAVENOUS | Status: DC
  Administered 2021-10-12 – 2021-10-13 (×3): 500 mg via INTRAVENOUS
  Filled 2021-10-11 (×4): qty 500

## 2021-10-11 MED ORDER — LACTATED RINGERS IV BOLUS
1000.0000 mL | Freq: Once | INTRAVENOUS | Status: AC
  Administered 2021-10-11: 1000 mL via INTRAVENOUS

## 2021-10-11 MED ORDER — SODIUM CHLORIDE 0.9 % IV SOLN
1.0000 g | Freq: Once | INTRAVENOUS | Status: AC
  Administered 2021-10-12: 1 g via INTRAVENOUS
  Filled 2021-10-11: qty 10

## 2021-10-11 NOTE — ED Triage Notes (Signed)
BIB [facility] from home. Per family pt went away had a seizure like activity per pt he past out. 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 Per EMS wasn't looking well on their arrival but has improved. Pt has needed to be suctioned at least one time by them

## 2021-10-11 NOTE — ED Notes (Signed)
Radiology at bedside

## 2021-10-11 NOTE — ED Notes (Signed)
RN aware of pts low BP

## 2021-10-11 NOTE — ED Notes (Signed)
Provider at bedside at this time
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
## 2021-10-11 NOTE — ED Notes (Signed)
Resp at bedside at this time

## 2021-10-12 ENCOUNTER — Emergency Department (HOSPITAL_COMMUNITY): Payer: Medicaid Other

## 2021-10-12 DIAGNOSIS — D649 Anemia, unspecified: Secondary | ICD-10-CM

## 2021-10-12 DIAGNOSIS — E43 Unspecified severe protein-calorie malnutrition: Secondary | ICD-10-CM

## 2021-10-12 DIAGNOSIS — R5381 Other malaise: Secondary | ICD-10-CM

## 2021-10-12 DIAGNOSIS — J432 Centrilobular emphysema: Secondary | ICD-10-CM | POA: Diagnosis present

## 2021-10-12 DIAGNOSIS — J690 Pneumonitis due to inhalation of food and vomit: Secondary | ICD-10-CM | POA: Diagnosis present

## 2021-10-12 DIAGNOSIS — Z923 Personal history of irradiation: Secondary | ICD-10-CM | POA: Diagnosis not present

## 2021-10-12 DIAGNOSIS — Z720 Tobacco use: Secondary | ICD-10-CM

## 2021-10-12 DIAGNOSIS — Z20822 Contact with and (suspected) exposure to covid-19: Secondary | ICD-10-CM | POA: Diagnosis present

## 2021-10-12 DIAGNOSIS — Z930 Tracheostomy status: Secondary | ICD-10-CM

## 2021-10-12 DIAGNOSIS — Z66 Do not resuscitate: Secondary | ICD-10-CM

## 2021-10-12 DIAGNOSIS — Z8521 Personal history of malignant neoplasm of larynx: Secondary | ICD-10-CM

## 2021-10-12 DIAGNOSIS — I951 Orthostatic hypotension: Secondary | ICD-10-CM | POA: Diagnosis present

## 2021-10-12 DIAGNOSIS — E860 Dehydration: Secondary | ICD-10-CM | POA: Diagnosis present

## 2021-10-12 DIAGNOSIS — D638 Anemia in other chronic diseases classified elsewhere: Secondary | ICD-10-CM | POA: Diagnosis present

## 2021-10-12 DIAGNOSIS — Z681 Body mass index (BMI) 19 or less, adult: Secondary | ICD-10-CM | POA: Diagnosis not present

## 2021-10-12 DIAGNOSIS — Z91013 Allergy to seafood: Secondary | ICD-10-CM | POA: Diagnosis not present

## 2021-10-12 DIAGNOSIS — Z9002 Acquired absence of larynx: Secondary | ICD-10-CM | POA: Diagnosis not present

## 2021-10-12 DIAGNOSIS — M1990 Unspecified osteoarthritis, unspecified site: Secondary | ICD-10-CM | POA: Diagnosis present

## 2021-10-12 DIAGNOSIS — F17210 Nicotine dependence, cigarettes, uncomplicated: Secondary | ICD-10-CM | POA: Diagnosis present

## 2021-10-12 DIAGNOSIS — Z79899 Other long term (current) drug therapy: Secondary | ICD-10-CM | POA: Diagnosis not present

## 2021-10-12 LAB — RESP PANEL BY RT-PCR (FLU A&B, COVID) ARPGX2
Influenza A by PCR: NEGATIVE
Influenza B by PCR: NEGATIVE
SARS Coronavirus 2 by RT PCR: NEGATIVE

## 2021-10-12 LAB — CBC WITH DIFFERENTIAL/PLATELET
Abs Immature Granulocytes: 0.05 10*3/uL (ref 0.00–0.07)
Basophils Absolute: 0 10*3/uL (ref 0.0–0.1)
Basophils Relative: 1 %
Eosinophils Absolute: 0.1 10*3/uL (ref 0.0–0.5)
Eosinophils Relative: 2 %
HCT: 27.7 % — ABNORMAL LOW (ref 39.0–52.0)
Hemoglobin: 8.1 g/dL — ABNORMAL LOW (ref 13.0–17.0)
Immature Granulocytes: 1 %
Lymphocytes Relative: 14 %
Lymphs Abs: 1.1 10*3/uL (ref 0.7–4.0)
MCH: 25.1 pg — ABNORMAL LOW (ref 26.0–34.0)
MCHC: 29.2 g/dL — ABNORMAL LOW (ref 30.0–36.0)
MCV: 85.8 fL (ref 80.0–100.0)
Monocytes Absolute: 0.6 10*3/uL (ref 0.1–1.0)
Monocytes Relative: 7 %
Neutro Abs: 6.3 10*3/uL (ref 1.7–7.7)
Neutrophils Relative %: 75 %
Platelets: 430 10*3/uL — ABNORMAL HIGH (ref 150–400)
RBC: 3.23 MIL/uL — ABNORMAL LOW (ref 4.22–5.81)
RDW: 15.7 % — ABNORMAL HIGH (ref 11.5–15.5)
WBC: 8.3 10*3/uL (ref 4.0–10.5)
nRBC: 0 % (ref 0.0–0.2)

## 2021-10-12 LAB — COMPREHENSIVE METABOLIC PANEL WITH GFR
ALT: 8 U/L (ref 0–44)
AST: 14 U/L — ABNORMAL LOW (ref 15–41)
Albumin: 2 g/dL — ABNORMAL LOW (ref 3.5–5.0)
Alkaline Phosphatase: 60 U/L (ref 38–126)
Anion gap: 9 (ref 5–15)
BUN: 7 mg/dL (ref 6–20)
CO2: 26 mmol/L (ref 22–32)
Calcium: 8.3 mg/dL — ABNORMAL LOW (ref 8.9–10.3)
Chloride: 98 mmol/L (ref 98–111)
Creatinine, Ser: 0.61 mg/dL (ref 0.61–1.24)
GFR, Estimated: >60 mL/min
Glucose, Bld: 98 mg/dL (ref 70–99)
Potassium: 3.7 mmol/L (ref 3.5–5.1)
Sodium: 133 mmol/L — ABNORMAL LOW (ref 135–145)
Total Bilirubin: 0.5 mg/dL (ref 0.3–1.2)
Total Protein: 6.4 g/dL — ABNORMAL LOW (ref 6.5–8.1)

## 2021-10-12 LAB — PROCALCITONIN: Procalcitonin: 0.16 ng/mL

## 2021-10-12 LAB — LACTIC ACID, PLASMA: Lactic Acid, Venous: 0.9 mmol/L (ref 0.5–1.9)

## 2021-10-12 MED ORDER — ALBUTEROL SULFATE (2.5 MG/3ML) 0.083% IN NEBU: 2.5000 mg | INHALATION_SOLUTION | RESPIRATORY_TRACT | Status: DC | PRN

## 2021-10-12 MED ORDER — MIRTAZAPINE 15 MG PO TABS
15.0000 mg | ORAL_TABLET | Freq: Every day | ORAL | Status: DC
  Administered 2021-10-12 – 2021-10-13 (×2): 15 mg via ORAL
  Filled 2021-10-12 (×4): qty 1

## 2021-10-12 MED ORDER — SODIUM CHLORIDE 0.9 % IV SOLN: INTRAVENOUS | Status: AC

## 2021-10-12 MED ORDER — BUDESONIDE 0.5 MG/2ML IN SUSP
0.5000 mg | Freq: Two times a day (BID) | RESPIRATORY_TRACT | Status: DC
  Administered 2021-10-12 – 2021-10-13 (×3): 0.5 mg via RESPIRATORY_TRACT
  Filled 2021-10-12 (×5): qty 2

## 2021-10-12 MED ORDER — SODIUM CHLORIDE 0.9 % IV SOLN
3.0000 g | Freq: Three times a day (TID) | INTRAVENOUS | Status: DC
  Administered 2021-10-12 – 2021-10-14 (×9): 3 g via INTRAVENOUS
  Filled 2021-10-12 (×11): qty 8

## 2021-10-12 MED ORDER — GABAPENTIN 300 MG PO CAPS
300.0000 mg | ORAL_CAPSULE | Freq: Two times a day (BID) | ORAL | Status: DC
  Administered 2021-10-12 – 2021-10-14 (×5): 300 mg via ORAL
  Filled 2021-10-12 (×5): qty 1

## 2021-10-12 MED ORDER — ACETAMINOPHEN 325 MG PO TABS
650.0000 mg | ORAL_TABLET | Freq: Four times a day (QID) | ORAL | Status: DC | PRN
  Administered 2021-10-13: 650 mg via ORAL
  Filled 2021-10-12: qty 2

## 2021-10-12 MED ORDER — METOPROLOL TARTRATE 12.5 MG HALF TABLET
12.5000 mg | ORAL_TABLET | Freq: Two times a day (BID) | ORAL | Status: DC
  Administered 2021-10-12 – 2021-10-14 (×4): 12.5 mg via ORAL
  Filled 2021-10-12 (×5): qty 1

## 2021-10-12 MED ORDER — ACETAMINOPHEN 650 MG RE SUPP: 650.0000 mg | Freq: Four times a day (QID) | RECTAL | Status: DC | PRN

## 2021-10-12 MED ORDER — MIDODRINE HCL 5 MG PO TABS
5.0000 mg | ORAL_TABLET | Freq: Three times a day (TID) | ORAL | Status: DC
  Administered 2021-10-12 – 2021-10-14 (×8): 5 mg via ORAL
  Filled 2021-10-12 (×8): qty 1

## 2021-10-12 MED ORDER — ENSURE ENLIVE PO LIQD
237.0000 mL | Freq: Three times a day (TID) | ORAL | Status: DC
  Administered 2021-10-12 – 2021-10-14 (×5): 237 mL via ORAL

## 2021-10-12 MED ORDER — ARFORMOTEROL TARTRATE 15 MCG/2ML IN NEBU
15.0000 ug | INHALATION_SOLUTION | Freq: Two times a day (BID) | RESPIRATORY_TRACT | Status: DC
  Administered 2021-10-12 – 2021-10-13 (×3): 15 ug via RESPIRATORY_TRACT
  Filled 2021-10-12 (×5): qty 2

## 2021-10-12 MED ORDER — GLYCOPYRROLATE 1 MG PO TABS
1.0000 mg | ORAL_TABLET | Freq: Three times a day (TID) | ORAL | Status: DC | PRN
  Filled 2021-10-12: qty 1

## 2021-10-12 MED ORDER — IOHEXOL 350 MG/ML SOLN
75.0000 mL | Freq: Once | INTRAVENOUS | Status: AC | PRN
  Administered 2021-10-12: 75 mL via INTRAVENOUS

## 2021-10-12 MED ORDER — IPRATROPIUM-ALBUTEROL 0.5-2.5 (3) MG/3ML IN SOLN
3.0000 mL | Freq: Four times a day (QID) | RESPIRATORY_TRACT | Status: DC
  Administered 2021-10-12 (×2): 3 mL via RESPIRATORY_TRACT
  Filled 2021-10-12 (×3): qty 3

## 2021-10-12 MED ORDER — ENOXAPARIN SODIUM 300 MG/3ML IJ SOLN
20.0000 mg | INTRAMUSCULAR | Status: DC
  Administered 2021-10-12: 20 mg via SUBCUTANEOUS
  Filled 2021-10-12 (×2): qty 0.2

## 2021-10-12 MED ORDER — POLYETHYLENE GLYCOL 3350 17 GM/SCOOP PO POWD: 17.0000 g | Freq: Two times a day (BID) | ORAL | Status: DC | PRN

## 2021-10-12 MED ORDER — SODIUM CHLORIDE 0.9 % IV BOLUS
500.0000 mL | Freq: Once | INTRAVENOUS | Status: AC
  Administered 2021-10-12: 500 mL via INTRAVENOUS

## 2021-10-12 MED ORDER — PANTOPRAZOLE SODIUM 40 MG PO TBEC: 40.0000 mg | DELAYED_RELEASE_TABLET | Freq: Every day | ORAL | Status: DC

## 2021-10-12 MED ORDER — IPRATROPIUM-ALBUTEROL 0.5-2.5 (3) MG/3ML IN SOLN
3.0000 mL | Freq: Two times a day (BID) | RESPIRATORY_TRACT | Status: DC
  Administered 2021-10-12 – 2021-10-13 (×3): 3 mL via RESPIRATORY_TRACT
  Filled 2021-10-12 (×3): qty 3
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
## 2021-10-12 NOTE — ED Notes (Signed)
Patient transported to CT 

## 2021-10-12 NOTE — Assessment & Plan Note (Signed)
Pt continues to smoke despite have head/neck cancer.

## 2021-10-12 NOTE — Progress Notes (Signed)
Pharmacy Antibiotic Note  Perry Waller is a 60 y.o. male admitted on 10/11/2021 with  asp pna .  Pharmacy has been consulted for Unasyn dosing.  Plan: Unasyn 3gm IV q8h Will f/u renal function, micro data, and pt's clinical condition  Height: 5\' 8"  (172.7 cm) Weight: 39 kg (86 lb) IBW/kg (Calculated) : 68.4  Temp (24hrs), Avg:98.1 F (36.7 C), Min:98.1 F (36.7 C), Max:98.1 F (36.7 C)  Recent Labs  Lab 10/11/21 2136 10/11/21 2150  WBC 8.4  --   CREATININE 0.77  --   LATICACIDVEN  --  2.9*    Estimated Creatinine Clearance: 54.2 mL/min (by C-G formula based on SCr of 0.77 mg/dL).    Allergies  Allergen Reactions   Fish Allergy Other (See Comments)    Breaks out    Antimicrobials this admission: 10/15 Rocephin x 1 10/14 Azithromycin >> 10/15 Unasyn >>   Thank you for allowing pharmacy to be a part of this patient's care.  Sherlon Handing, PharmD, BCPS Please see amion for complete clinical pharmacist phone list 10/12/2021 1:41 AM

## 2021-10-12 NOTE — ED Notes (Signed)
Admit provider at bedside 

## 2021-10-12 NOTE — ED Notes (Signed)
Sent provider a message reference pt being hypotensive through out the night

## 2021-10-12 NOTE — Assessment & Plan Note (Signed)
Chronic. On RA during day. On trach collar at night.

## 2021-10-12 NOTE — ED Notes (Signed)
Breakfast Order Placed ?

## 2021-10-12 NOTE — Progress Notes (Signed)
PROGRESS NOTE  Perry Waller ZOX:[id number] DOB: 23-Nov-1961   PCP: Charlott Rakes, MD  Patient is from: Home.  Patient has home RN 8 hours a day.  DOA: 10/11/2021 LOS: 0  Chief complaints:  Chief Complaint  Patient presents with   Near Syncope     Brief Narrative / Interim history: 60 year old M with PMH of laryngeal cancer s/p tracheostomy, severe protein calorie malnutrition, orthostatic hypotension, emphysema and tobacco use presenting with what looks like a syncopal episode with shaking, and admitted for aspiration pneumonia.  Had poor p.o. intake leading to his presentation.  CTA chest with RUL consolidation with both areas of nodularity in RML and RLL, and emphysema.  Lactic acid elevated to 2.9.  Pro-Cal 0.16.  Started on IV Unasyn and azithromycin and admitted.  Subjective: Seen and examined earlier this morning.  Patient was very irritable not forthcoming with conversation.  He says no one is helping with his breakfast as he cannot move his right arm. He is angry and throwing his left arm around.  Does not appear to be in distress.  Objective: Vitals:   10/12/21 0927 10/12/21 0930 10/12/21 1015 10/12/21 1220  BP: 106/77 111/77 95/72 97/75   Pulse: 85 83 89 93  Resp:  (!) 23 (!) 23 20  Temp:      TempSrc:      SpO2:  98% 98% 100%  Weight:      Height:        Intake/Output Summary (Last 24 hours) at 10/12/2021 1321 Last data filed at 10/12/2021 0314 Gross per 24 hour  Intake 1443.53 ml  Output --  Net 1443.53 ml   Filed Weights   10/11/21 2139  Weight: 39 kg    Examination:  GENERAL: No apparent distress.  Nontoxic. HEENT: MMM.  Vision and hearing grossly intact.  NECK: Tracheostomy in place. RESP: 99% on 5 L / 28%.  No IWOB.  Fair aeration bilaterally. CVS:  RRR. Heart sounds normal.  ABD/GI/GU: BS+. Abd soft, NTND.  MSK/EXT:  Moves extremities except RUE.  Significant muscle mass and subcu fat loss. SKIN: no apparent skin lesion or wound NEURO: Awake and  alert.  Seems to be fairly oriented.  No apparent focal neuro deficit except RUE weakness. PSYCH: Looks irritable and angry  Procedures:  None  Microbiology summarized: [id number] and influenza PCR nonreactive.  Assessment & Plan: Aspiration pneumonia-CTA chest with RUL consolidation and multiple nodularities within RML and RLL.  There is also question of circumferential right apical pleural thickening.  This is likely from aspiration pneumonia although it is difficult to rule out malignancy completely given his history.  Previously cleared for regular diet when hospitalized in 05/2021. -Continue IV Unasyn and azithromycin -Needs repeat CT in 4 to 6 weeks to evaluate response. -SLP eval and aspiration precaution  History of laryngeal cancer s/p tracheostomy-stable. -Continue trach collar and tracheostomy care -Continue home glycopyrrolate for excess secretion  Syncope/shaking-likely from hypotension in the setting of dehydration and poor p.o. intake.  Patient has history of orthostatic hypotension and takes midodrine.  Doubt seizure activity. -Continue home midodrine  Centrilobular emphysema/tobacco use disorder -Encourage tobacco cessation -Brovana, Pulmicort and as needed DuoNeb while here  Chronic anemia: H&H relatively stable. Recent Labs    05/24/21 1735 05/25/21 0340 05/26/21 0117 05/29/21 0423 06/09/21 0531 06/11/21 0031 06/12/21 0242 06/13/21 0300 10/11/21 2136 10/12/21 0329  HGB 9.8* 8.9* 8.4* 8.2* 8.7* 8.4* 8.6* 8.3* 9.1* 8.1*  -Check anemia panel in the morning  Physical deconditioning/weakness -PT/OT eval  Severe malnutrition-as evidenced by low BMI and significant muscle mass and subcu fat loss Body mass index is 13.08 kg/m. -Continue Ensure [employer] -Resume home Remeron  -Consult dietitian       DVT prophylaxis:  SCDs Start: 10/12/21 0225  Code Status: DNR/DNI Family Communication: Patient and/or RN. Available if any question.  Level of care:  Progressive Status is: Inpatient  Remains inpatient appropriate because: Need for IV antibiotics       Consultants:  None   Sch Meds:  Scheduled Meds:  arformoterol  15 mcg Nebulization BID   budesonide (PULMICORT) nebulizer solution  0.5 mg Nebulization BID   enoxaparin (LOVENOX) injection  20 mg Subcutaneous Q24H   feeding supplement (ENSURE COMPLETE)  237 mL Oral TID BM   gabapentin  300 mg Oral BID   ipratropium-albuterol  3 mL Nebulization QID   metoprolol tartrate  12.5 mg Oral BID   midodrine  5 mg Oral TID WC   mirtazapine  15 mg Oral QHS   pantoprazole  40 mg Oral Daily   Continuous Infusions:  sodium chloride 100 mL/hr at 10/12/21 0700   ampicillin-sulbactam (UNASYN) IV 3 g (10/12/21 1049)   azithromycin Stopped (10/12/21 0215)   PRN Meds:.acetaminophen **OR** acetaminophen, albuterol, Glycopyrrolate, polyethylene glycol powder  Antimicrobials: Anti-infectives (From admission, onward)    Start     Dose/Rate Route Frequency Ordered Stop   10/12/21 0200  Ampicillin-Sulbactam (UNASYN) 3 g in sodium chloride 0.9 % 100 mL IVPB        3 g 200 mL/hr over 30 Minutes Intravenous Every 8 hours 10/12/21 0143     10/11/21 2345  azithromycin (ZITHROMAX) 500 mg in sodium chloride 0.9 % 250 mL IVPB        500 mg 250 mL/hr over 60 Minutes Intravenous Every 24 hours 10/11/21 2342     10/11/21 2345  cefTRIAXone (ROCEPHIN) 1 g in sodium chloride 0.9 % 100 mL IVPB        1 g 200 mL/hr over 30 Minutes Intravenous  Once 10/11/21 2342 10/12/21 0110        I have personally reviewed the following labs and images: CBC: Recent Labs  Lab 10/11/21 2136 10/12/21 0329  WBC 8.4 8.3  NEUTROABS 6.4 6.3  HGB 9.1* 8.1*  HCT 30.7* 27.7*  MCV 86.7 85.8  PLT 453* 430*   BMP &GFR Recent Labs  Lab 10/11/21 2136 10/12/21 0329  NA 135 133*  K 3.7 3.7  CL 99 98  CO2 26 26  GLUCOSE 113* 98  BUN 9 7  CREATININE 0.77 0.61  CALCIUM 8.7* 8.3*   Estimated Creatinine Clearance:  54.2 mL/min (by C-G formula based on SCr of 0.61 mg/dL). Liver & Pancreas: Recent Labs  Lab 10/11/21 2136 10/12/21 0329  AST 18 14*  ALT 9 8  ALKPHOS 72 60  BILITOT 0.4 0.5  PROT 7.4 6.4*  ALBUMIN 2.5* 2.0*   No results for input(s): LIPASE, AMYLASE in the last 168 hours. No results for input(s): AMMONIA in the last 168 hours. Diabetic: No results for input(s): HGBA1C in the last 72 hours. No results for input(s): GLUCAP in the last 168 hours. Cardiac Enzymes: No results for input(s): CKTOTAL, CKMB, CKMBINDEX, TROPONINI in the last 168 hours. No results for input(s): PROBNP in the last 8760 hours. Coagulation Profile: No results for input(s): INR, PROTIME in the last 168 hours. Thyroid Function Tests: No results for input(s): TSH, T4TOTAL, FREET4, T3FREE, THYROIDAB in the last 72 hours. Lipid Profile: No results  for input(s): CHOL, HDL, LDLCALC, TRIG, CHOLHDL, LDLDIRECT in the last 72 hours. Anemia Panel: No results for input(s): VITAMINB12, FOLATE, FERRITIN, TIBC, IRON, RETICCTPCT in the last 72 hours. Urine analysis:    Component Value Date/Time   COLORURINE YELLOW 06/09/2021 0520   APPEARANCEUR CLOUDY (A) 06/09/2021 0520   LABSPEC 1.018 06/09/2021 0520   PHURINE 9.0 (H) 06/09/2021 0520   GLUCOSEU NEGATIVE 06/09/2021 0520   HGBUR NEGATIVE 06/09/2021 0520   BILIRUBINUR NEGATIVE 06/09/2021 0520   KETONESUR NEGATIVE 06/09/2021 0520   PROTEINUR 30 (A) 06/09/2021 0520   UROBILINOGEN 0.2 10/17/2011 2052   NITRITE NEGATIVE 06/09/2021 0520   LEUKOCYTESUR LARGE (A) 06/09/2021 0520   Sepsis Labs: Invalid input(s): PROCALCITONIN, [place]  Microbiology: Recent Results (from the past 240 hour(s))  Resp Panel by RT-PCR (Flu A&B, Covid) Nasopharyngeal Swab     Status: None   Collection Time: 10/12/21  2:17 AM   Specimen: Nasopharyngeal Swab; Nasopharyngeal(NP) swabs in vial transport medium  Result Value Ref Range Status   SARS Coronavirus 2 by RT PCR NEGATIVE NEGATIVE  Final    Comment: (NOTE) SARS-CoV-2 target nucleic acids are NOT DETECTED.  The SARS-CoV-2 RNA is generally detectable in upper respiratory specimens during the acute phase of infection. The lowest concentration of SARS-CoV-2 viral copies this assay can detect is 138 copies/mL. A negative result does not preclude SARS-Cov-2 infection and should not be used as the sole basis for treatment or other patient management decisions. A negative result may occur with  improper specimen collection/handling, submission of specimen other than nasopharyngeal swab, presence of viral mutation(s) within the areas targeted by this assay, and inadequate number of viral copies(<138 copies/mL). A negative result must be combined with clinical observations, patient history, and epidemiological information. The expected result is Negative.  Fact Sheet for Patients:  EntrepreneurPulse.com.au  Fact Sheet for Healthcare Providers:  IncredibleEmployment.be  This test is no t yet approved or cleared by the [place] FDA and  has been authorized for detection and/or diagnosis of SARS-CoV-2 by FDA under an Emergency Use Authorization (EUA). This EUA will remain  in effect (meaning this test can be used) for the duration of the COVID-19 declaration under Section 564(b)(1) of the Act, 21 U.S.C.section 360bbb-3(b)(1), unless the authorization is terminated  or revoked sooner.       Influenza A by PCR NEGATIVE NEGATIVE Final   Influenza B by PCR NEGATIVE NEGATIVE Final    Comment: (NOTE) The Xpert Xpress SARS-CoV-2/FLU/RSV plus assay is intended as an aid in the diagnosis of influenza from Nasopharyngeal swab specimens and should not be used as a sole basis for treatment. Nasal washings and aspirates are unacceptable for Xpert Xpress SARS-CoV-2/FLU/RSV testing.  Fact Sheet for Patients: EntrepreneurPulse.com.au  Fact Sheet for Healthcare  Providers: IncredibleEmployment.be  This test is not yet approved or cleared by the [place] FDA and has been authorized for detection and/or diagnosis of SARS-CoV-2 by FDA under an Emergency Use Authorization (EUA). This EUA will remain in effect (meaning this test can be used) for the duration of the COVID-19 declaration under Section 564(b)(1) of the Act, 21 U.S.C. section 360bbb-3(b)(1), unless the authorization is terminated or revoked.  Performed at [facility], [place] [address], [place],  [address]     Radiology Studies: CT Angio Chest PE W and/or Wo Contrast  Result Date: 10/12/2021 CLINICAL DATA:  PE suspected, high prob EXAM: CT ANGIOGRAPHY CHEST WITH CONTRAST TECHNIQUE: Multidetector CT imaging of the chest was performed using the standard  protocol during bolus administration of intravenous contrast. Multiplanar CT image reconstructions and MIPs were obtained to evaluate the vascular anatomy. CONTRAST:  73mL OMNIPAQUE IOHEXOL 350 MG/ML SOLN COMPARISON:  None. FINDINGS: Cardiovascular: Contrast injection is sufficient to demonstrate satisfactory opacification of the pulmonary arteries to the segmental level. There is no pulmonary embolus or evidence of right heart strain. The size of the main pulmonary artery is normal. Heart size is normal, with no pericardial effusion. The course and caliber of the aorta are normal. There is mild atherosclerotic calcification. Opacification decreased due to pulmonary arterial phase contrast bolus timing. Mediastinum/Nodes: Tracheostomy tube terminates at the level of the clavicular heads. Visualized thyroid is normal. No axillary lymphadenopathy. Lungs/Pleura: Severe emphysema with biapical bullae. There is circumferential pleural thickening at the right lung apex. Large area of consolidation in the upper right lung. There is mild multifocal opacity in the left lung. Small right pleural effusion. Upper Abdomen:  Contrast bolus timing is not optimized for evaluation of the abdominal organs. Poor visualization of the upper abdomen due to streak artifact from the arms. Musculoskeletal: No chest Polgar abnormality. No bony spinal canal stenosis. Review of the MIP images confirms the above findings. IMPRESSION: 1. No pulmonary embolus 2. Large area of consolidation in the upper right lung with multiple areas of nodularity in the middle and lower lobes. Findings are most suggestive of infection. Followup chest CT with contrast (not CTA PE protocol) is recommended in 4-6 weeks following trial of antibiotic therapy to ensure resolution and exclude underlying malignancy. 3. Circumferential right apical pleural thickening and/or loculated effusion. 4. Advanced emphysema with large biapical bullae. Aortic Atherosclerosis (ICD10-I70.0) and Emphysema (ICD10-J43.9). Electronically Signed   By: Ulyses Jarred M.D.   On: 10/12/2021 00:55   DG CHEST PORT 1 VIEW  Result Date: 10/11/2021 CLINICAL DATA:  Syncope EXAM: PORTABLE CHEST 1 VIEW COMPARISON:  06/09/2021 FINDINGS: Masslike opacity in the right upper lobe. Additional reticulonodular opacities in the right lower lung. Left lung is clear, noting scarring at the left lung apex. No pleural effusion or pneumothorax. Tracheostomy in satisfactory position. The heart is normal in size. IMPRESSION: Masslike opacity in the right upper lobe. Consider CT chest with contrast for further evaluation. Electronically Signed   By: Julian Hy M.D.   On: 10/11/2021 22:13      Ellesse Antenucci T. Dupont  If 7PM-7AM, please contact night-coverage [email or web address] 10/12/2021, 1:21 PM

## 2021-10-12 NOTE — ED Notes (Signed)
Patients sister Areatha Keas would like and update [phone]

## 2021-10-12 NOTE — Assessment & Plan Note (Signed)
Stable

## 2021-10-12 NOTE — Progress Notes (Signed)
*  Late note* Pt transported to CT and back to [facility] w/o complications. RT will cont to monitor.

## 2021-10-12 NOTE — Subjective & Objective (Signed)
CC: syncope HPI: 60 yo African-American male with a history of tracheostomy status post head neck cancer, history of orthostasis, history of severe centrilobular emphysema, chronic tobacco abuse despite having had head neck cancer presents the ER today after having a syncopal episode at home.  Discussed the case with the patient's Sister Blain Pais.  She states that she was playing dominoes with the patient.  He was sitting at the table with her.  She states that his eyes rolled to the back of his head and he slumped over.  He started shaking.  Patient has no prior history of seizures.  She does state the patient's been drinking lots of ensures this past week.  He became constipated.  Patient has 8 hours a day of home nursing.  Sister is unsure if the patient's been having any vomiting.  Sister states that he he does not eat much.  Does not drink a lot of water.  On arrival of EMS, patient noted to be hypotensive.  Work-up in the ER is unremarkable except for lactic acid of 2.9.  CTPA demonstrated a large right upper lobe pneumonia on top of his chronic emphysematous lung changes.  Patient is currently on 5 L by trach collar.  Patient unable to give any history or review of systems due to altered mental status.  [facility] hospitalist contacted for admission.

## 2021-10-12 NOTE — Assessment & Plan Note (Signed)
Chronic. 

## 2021-10-12 NOTE — Assessment & Plan Note (Signed)
Verified with pt's sister

## 2021-10-12 NOTE — Assessment & Plan Note (Signed)
Admit to medical bed. Given location of his RUL pneumonia, will treat as aspiration pneumonia with IV unasyn.

## 2021-10-13 DIAGNOSIS — Z66 Do not resuscitate: Secondary | ICD-10-CM | POA: Diagnosis not present

## 2021-10-13 DIAGNOSIS — D649 Anemia, unspecified: Secondary | ICD-10-CM | POA: Diagnosis not present

## 2021-10-13 DIAGNOSIS — J432 Centrilobular emphysema: Secondary | ICD-10-CM | POA: Diagnosis not present

## 2021-10-13 DIAGNOSIS — J690 Pneumonitis due to inhalation of food and vomit: Secondary | ICD-10-CM | POA: Diagnosis not present

## 2021-10-13 DIAGNOSIS — R1110 Vomiting, unspecified: Secondary | ICD-10-CM

## 2021-10-13 LAB — CBC
HCT: 26.9 % — ABNORMAL LOW (ref 39.0–52.0)
Hemoglobin: 8 g/dL — ABNORMAL LOW (ref 13.0–17.0)
MCH: 25.2 pg — ABNORMAL LOW (ref 26.0–34.0)
MCHC: 29.7 g/dL — ABNORMAL LOW (ref 30.0–36.0)
MCV: 84.9 fL (ref 80.0–100.0)
Platelets: 377 10*3/uL (ref 150–400)
RBC: 3.17 MIL/uL — ABNORMAL LOW (ref 4.22–5.81)
RDW: 15.7 % — ABNORMAL HIGH (ref 11.5–15.5)
WBC: 7.6 10*3/uL (ref 4.0–10.5)
nRBC: 0 % (ref 0.0–0.2)

## 2021-10-13 LAB — RENAL FUNCTION PANEL
Albumin: 1.9 g/dL — ABNORMAL LOW (ref 3.5–5.0)
Anion gap: 8 (ref 5–15)
BUN: 7 mg/dL (ref 6–20)
CO2: 25 mmol/L (ref 22–32)
Calcium: 8 mg/dL — ABNORMAL LOW (ref 8.9–10.3)
Chloride: 103 mmol/L (ref 98–111)
Creatinine, Ser: 0.57 mg/dL — ABNORMAL LOW (ref 0.61–1.24)
GFR, Estimated: >60 mL/min (ref >60)
Glucose, Bld: 110 mg/dL — ABNORMAL HIGH (ref 70–99)
Phosphorus: 3.8 mg/dL (ref 2.5–4.6)
Potassium: 3.8 mmol/L (ref 3.5–5.1)
Sodium: 136 mmol/L (ref 135–145)

## 2021-10-13 LAB — IRON AND TIBC
Iron: 14 ug/dL — ABNORMAL LOW (ref 45–182)
Saturation Ratios: 8 % — ABNORMAL LOW (ref 17.9–39.5)
TIBC: 165 ug/dL — ABNORMAL LOW (ref 250–450)
UIBC: 151 ug/dL

## 2021-10-13 LAB — MAGNESIUM: Magnesium: 1.6 mg/dL — ABNORMAL LOW (ref 1.7–2.4)

## 2021-10-13 LAB — RETICULOCYTES
Immature Retic Fract: 23.7 % — ABNORMAL HIGH (ref 2.3–15.9)
RBC.: 3.05 MIL/uL — ABNORMAL LOW (ref 4.22–5.81)
Retic Count, Absolute: 70.2 10*3/uL (ref 19.0–186.0)
Retic Ct Pct: 2.3 % (ref 0.4–3.1)

## 2021-10-13 LAB — FERRITIN: Ferritin: 362 ng/mL — ABNORMAL HIGH (ref 24–336)

## 2021-10-13 LAB — VITAMIN B12: Vitamin B-12: 559 pg/mL (ref 180–914)

## 2021-10-13 LAB — PROCALCITONIN: Procalcitonin: <0.1 ng/mL

## 2021-10-13 LAB — FOLATE: Folate: 10.2 ng/mL (ref >5.9)

## 2021-10-13 MED ORDER — ENOXAPARIN SODIUM 30 MG/0.3ML IJ SOSY
30.0000 mg | PREFILLED_SYRINGE | INTRAMUSCULAR | Status: DC
  Administered 2021-10-13 – 2021-10-14 (×2): 30 mg via SUBCUTANEOUS
  Filled 2021-10-13 (×2): qty 0.3

## 2021-10-13 MED ORDER — MAGNESIUM SULFATE 2 GM/50ML IV SOLN
2.0000 g | Freq: Once | INTRAVENOUS | Status: AC
  Administered 2021-10-13: 2 g via INTRAVENOUS
  Filled 2021-10-13: qty 50

## 2021-10-13 MED ORDER — PANTOPRAZOLE SODIUM 40 MG PO TBEC
40.0000 mg | DELAYED_RELEASE_TABLET | Freq: Every day | ORAL | Status: DC
  Administered 2021-10-13 – 2021-10-14 (×2): 40 mg via ORAL
  Filled 2021-10-13 (×2): qty 1

## 2021-10-13 NOTE — Progress Notes (Signed)
PROGRESS NOTE  Perry Waller CWC:[id number] DOB: Mar 12, 1961   PCP: Charlott Rakes, MD  Patient is from: Home.  Patient has home RN 8 hours a day.  DOA: 10/11/2021 LOS: 1  Chief complaints:  Chief Complaint  Patient presents with   Near Syncope     Brief Narrative / Interim history: 60 year old M with PMH of laryngeal cancer s/p tracheostomy, severe protein calorie malnutrition, orthostatic hypotension, emphysema and tobacco use presenting with what looks like a syncopal episode with shaking, and admitted for aspiration pneumonia.  Had poor p.o. intake leading to his presentation.  CTA chest with RUL consolidation with both areas of nodularity in RML and RLL, and emphysema.  Lactic acid elevated to 2.9.  Pro-Cal 0.16.  Started on IV Unasyn and azithromycin and admitted.  Subjective: Seen and examined earlier this morning.  Patient had an episode of emesis and aspiration earlier this morning.  He reports, a little" shortness of breath and cough.  He denies chest pain, nausea, abdominal pain or UTI symptoms.  Objective: Vitals:   10/13/21 0600 10/13/21 0813 10/13/21 0920 10/13/21 1422  BP:  113/79 107/77 103/83  Pulse:  (!) 108 (!) 106 (!) 107  Resp:  18 20 18   Temp:  98.2 F (36.8 C)  99 F (37.2 C)  TempSrc:  Oral  Oral  SpO2:  94%  98%  Weight: 41 kg     Height:        Intake/Output Summary (Last 24 hours) at 10/13/2021 1813 Last data filed at 10/13/2021 1747 Gross per 24 hour  Intake 1034.09 ml  Output 1050 ml  Net -15.91 ml   Filed Weights   10/11/21 2139 10/12/21 1725 10/13/21 0600  Weight: 39 kg 39.8 kg 41 kg    Examination:  GENERAL: Frail and chronically ill-appearing. HEENT: MMM.  Vision and hearing grossly intact.  NECK: On trach collar. RESP: On trach collar.  No IWOB.  Rhonchi bilaterally. CVS:  RRR. Heart sounds normal.  ABD/GI/GU: BS+. Abd soft, NTND.  MSK/EXT:  Moves extremities.  Significant muscle mass and subcu fat loss. SKIN: no apparent skin  lesion or wound NEURO: Awake and alert. Oriented appropriately.  No apparent focal neuro deficit. PSYCH: Calm. Normal affect.   Procedures:  None  Microbiology summarized: [id number] and influenza PCR nonreactive.  Assessment & Plan: Aspiration pneumonia-CTA chest with RUL consolidation and multiple nodularities within RML and RLL.  There is also question of circumferential right apical pleural thickening.  This is likely from aspiration pneumonia although it is difficult to rule out malignancy completely given his history.  Had an episode of emesis with possible aspiration last night.  Cleared by SLP for regular diet with aspiration precaution.  -Continue IV Unasyn and azithromycin -Needs repeat CT in 4 to 6 weeks to evaluate response. -Continue aspiration precaution.  History of laryngeal cancer s/p tracheostomy-stable. -Continue trach collar and tracheostomy care -Continue home glycopyrrolate for excess secretion  Syncope/shaking-likely from hypotension in the setting of dehydration and poor p.o. intake.  Patient has history of orthostatic hypotension and takes midodrine.  Doubt seizure activity. -Continue home midodrine  Centrilobular emphysema/tobacco use disorder -Encourage tobacco cessation -Brovana, Pulmicort and as needed DuoNeb while here  Chronic anemia: H&H relatively stable.  Anemia panel consistent with anemia of chronic disease. Recent Labs    05/25/21 0340 05/26/21 0117 05/29/21 0423 06/09/21 0531 06/11/21 0031 06/12/21 0242 06/13/21 0300 10/11/21 2136 10/12/21 0329 10/13/21 0248  HGB 8.9* 8.4* 8.2* 8.7* 8.4* 8.6* 8.3* 9.1* 8.1* 8.0*  -  Continue monitoring  Physical deconditioning/weakness-reports using walker at baseline. -PT/OT eval  Severe malnutrition-as evidenced by low BMI and significant muscle mass and subcu fat loss Body mass index is 13.74 kg/m. -Continue Ensure [employer] -Resume home Remeron  -Consult dietitian       DVT prophylaxis:   enoxaparin (LOVENOX) injection 30 mg Start: 10/13/21 1400 SCDs Start: 10/12/21 0225  Code Status: DNR/DNI Family Communication: Updated patient's sister over the phone. Level of care: Progressive.  Change level of care to telemetry. Status is: Inpatient  Remains inpatient appropriate because: Need for IV antibiotics       Consultants:  None   Sch Meds:  Scheduled Meds:  arformoterol  15 mcg Nebulization BID   budesonide (PULMICORT) nebulizer solution  0.5 mg Nebulization BID   enoxaparin (LOVENOX) injection  30 mg Subcutaneous Q24H   feeding supplement  237 mL Oral TID BM   gabapentin  300 mg Oral BID   ipratropium-albuterol  3 mL Nebulization BID   metoprolol tartrate  12.5 mg Oral BID   midodrine  5 mg Oral TID WC   mirtazapine  15 mg Oral QHS   Continuous Infusions:  ampicillin-sulbactam (UNASYN) IV 3 g (10/13/21 1742)   azithromycin 500 mg (10/13/21 0136)   PRN Meds:.acetaminophen **OR** acetaminophen, albuterol, glycopyrrolate  Antimicrobials: Anti-infectives (From admission, onward)    Start     Dose/Rate Route Frequency Ordered Stop   10/12/21 0200  Ampicillin-Sulbactam (UNASYN) 3 g in sodium chloride 0.9 % 100 mL IVPB        3 g 200 mL/hr over 30 Minutes Intravenous Every 8 hours 10/12/21 0143     10/11/21 2345  azithromycin (ZITHROMAX) 500 mg in sodium chloride 0.9 % 250 mL IVPB        500 mg 250 mL/hr over 60 Minutes Intravenous Every 24 hours 10/11/21 2342     10/11/21 2345  cefTRIAXone (ROCEPHIN) 1 g in sodium chloride 0.9 % 100 mL IVPB        1 g 200 mL/hr over 30 Minutes Intravenous  Once 10/11/21 2342 10/12/21 0110        I have personally reviewed the following labs and images: CBC: Recent Labs  Lab 10/11/21 2136 10/12/21 0329 10/13/21 0248  WBC 8.4 8.3 7.6  NEUTROABS 6.4 6.3  --   HGB 9.1* 8.1* 8.0*  HCT 30.7* 27.7* 26.9*  MCV 86.7 85.8 84.9  PLT 453* 430* 377   BMP &GFR Recent Labs  Lab 10/11/21 2136 10/12/21 0329  10/13/21 0248  NA 135 133* 136  K 3.7 3.7 3.8  CL 99 98 103  CO2 26 26 25   GLUCOSE 113* 98 110*  BUN 9 7 7   CREATININE 0.77 0.61 0.57*  CALCIUM 8.7* 8.3* 8.0*  MG  --   --  1.6*  PHOS  --   --  3.8   Estimated Creatinine Clearance: 56.9 mL/min (A) (by C-G formula based on SCr of 0.57 mg/dL (L)). Liver & Pancreas: Recent Labs  Lab 10/11/21 2136 10/12/21 0329 10/13/21 0248  AST 18 14*  --   ALT 9 8  --   ALKPHOS 72 60  --   BILITOT 0.4 0.5  --   PROT 7.4 6.4*  --   ALBUMIN 2.5* 2.0* 1.9*   No results for input(s): LIPASE, AMYLASE in the last 168 hours. No results for input(s): AMMONIA in the last 168 hours. Diabetic: No results for input(s): HGBA1C in the last 72 hours. No results for input(s): GLUCAP in the  last 168 hours. Cardiac Enzymes: No results for input(s): CKTOTAL, CKMB, CKMBINDEX, TROPONINI in the last 168 hours. No results for input(s): PROBNP in the last 8760 hours. Coagulation Profile: No results for input(s): INR, PROTIME in the last 168 hours. Thyroid Function Tests: No results for input(s): TSH, T4TOTAL, FREET4, T3FREE, THYROIDAB in the last 72 hours. Lipid Profile: No results for input(s): CHOL, HDL, LDLCALC, TRIG, CHOLHDL, LDLDIRECT in the last 72 hours. Anemia Panel: Recent Labs    10/13/21 0248  VITAMINB12 559  FOLATE 10.2  FERRITIN 362*  TIBC 165*  IRON 14*  RETICCTPCT 2.3   Urine analysis:    Component Value Date/Time   COLORURINE YELLOW 06/09/2021 0520   APPEARANCEUR CLOUDY (A) 06/09/2021 0520   LABSPEC 1.018 06/09/2021 0520   PHURINE 9.0 (H) 06/09/2021 0520   GLUCOSEU NEGATIVE 06/09/2021 0520   HGBUR NEGATIVE 06/09/2021 0520   BILIRUBINUR NEGATIVE 06/09/2021 0520   KETONESUR NEGATIVE 06/09/2021 0520   PROTEINUR 30 (A) 06/09/2021 0520   UROBILINOGEN 0.2 10/17/2011 2052   NITRITE NEGATIVE 06/09/2021 0520   LEUKOCYTESUR LARGE (A) 06/09/2021 0520   Sepsis Labs: Invalid input(s): PROCALCITONIN, [place]  Microbiology: Recent  Results (from the past 240 hour(s))  Resp Panel by RT-PCR (Flu A&B, Covid) Nasopharyngeal Swab     Status: None   Collection Time: 10/12/21  2:17 AM   Specimen: Nasopharyngeal Swab; Nasopharyngeal(NP) swabs in vial transport medium  Result Value Ref Range Status   SARS Coronavirus 2 by RT PCR NEGATIVE NEGATIVE Final    Comment: (NOTE) SARS-CoV-2 target nucleic acids are NOT DETECTED.  The SARS-CoV-2 RNA is generally detectable in upper respiratory specimens during the acute phase of infection. The lowest concentration of SARS-CoV-2 viral copies this assay can detect is 138 copies/mL. A negative result does not preclude SARS-Cov-2 infection and should not be used as the sole basis for treatment or other patient management decisions. A negative result may occur with  improper specimen collection/handling, submission of specimen other than nasopharyngeal swab, presence of viral mutation(s) within the areas targeted by this assay, and inadequate number of viral copies(<138 copies/mL). A negative result must be combined with clinical observations, patient history, and epidemiological information. The expected result is Negative.  Fact Sheet for Patients:  EntrepreneurPulse.com.au  Fact Sheet for Healthcare Providers:  IncredibleEmployment.be  This test is no t yet approved or cleared by the [place] FDA and  has been authorized for detection and/or diagnosis of SARS-CoV-2 by FDA under an Emergency Use Authorization (EUA). This EUA will remain  in effect (meaning this test can be used) for the duration of the COVID-19 declaration under Section 564(b)(1) of the Act, 21 U.S.C.section 360bbb-3(b)(1), unless the authorization is terminated  or revoked sooner.       Influenza A by PCR NEGATIVE NEGATIVE Final   Influenza B by PCR NEGATIVE NEGATIVE Final    Comment: (NOTE) The Xpert Xpress SARS-CoV-2/FLU/RSV plus assay is intended as an aid in the  diagnosis of influenza from Nasopharyngeal swab specimens and should not be used as a sole basis for treatment. Nasal washings and aspirates are unacceptable for Xpert Xpress SARS-CoV-2/FLU/RSV testing.  Fact Sheet for Patients: EntrepreneurPulse.com.au  Fact Sheet for Healthcare Providers: IncredibleEmployment.be  This test is not yet approved or cleared by the [place] FDA and has been authorized for detection and/or diagnosis of SARS-CoV-2 by FDA under an Emergency Use Authorization (EUA). This EUA will remain in effect (meaning this test can be used) for the duration of the COVID-19 declaration  under Section 564(b)(1) of the Act, 21 U.S.C. section 360bbb-3(b)(1), unless the authorization is terminated or revoked.  Performed at [facility], [place] [address] [place], [place] [address]     Radiology Studies: No results found.    Mussa Groesbeck T. Hassell  If 7PM-7AM, please contact night-coverage [email or web address] 10/13/2021, 6:13 PM

## 2021-10-13 NOTE — Evaluation (Signed)
Physical Therapy Evaluation Patient Details Name: Perry Waller MRN: [id number] DOB: 1961-07-13 Today's Date: 10/13/2021  History of Present Illness  60 yo presenting with syncopal episode, and admitted for aspiration pneumonia. Had poor p.o. intake leading to his presentation. PMH of laryngeal cancer s/p tracheostomy, severe protein calorie malnutrition, orthostatic hypotension, COPD, emphysema and tobacco use.  Clinical Impression  PTA pt living with sister in single level home with steps to enter. Pt with HHAide 8 hrs/day 7day/wk that assist with mobility. Pt reports that he has been able to ambulate in his home until the last week or so due to R LE pain. Pt with stable BP during session with positional change. Pt with increased pain and numbness with weightbearing through R LE. HR noted max 138bpm. On exam pt noted to have lost dorsiflexion in R ankle and bilateral numbness/tingling calf and below. With palpation in standing heel cord noted to be very tight in standing and is tender at Achilles tendon insertion site. Pt educated on heel cord tightening with drop foot and give heel cord stretch in supine before getting up to bear weight. Pt would benefit from AFO as well as he notes always tripping on R side. PT recommending HHPT at discharge and will continue to see acutely.       Recommendations for follow up therapy are one component of a multi-disciplinary discharge planning process, led by the attending physician.  Recommendations may be updated based on patient status, additional functional criteria and insurance authorization.  Follow Up Recommendations Home health PT;Supervision for mobility/OOB    Equipment Recommendations  Other (comment) (R sided AFO)       Precautions / Restrictions Precautions Precautions: Fall Precaution Comments: pain R ankle with standing  - most likley due to tight achilles Restrictions Weight Bearing Restrictions: No      Mobility  Bed Mobility Overal  bed mobility: Needs Assistance Bed Mobility: Supine to Sit;Sit to Supine     Supine to sit: Supervision Sit to supine: Supervision   General bed mobility comments: increased cuing for safety with IV    Transfers Overall transfer level: Needs assistance Equipment used: Rolling walker (2 wheeled) Transfers: Sit to/from Stand Sit to Stand: Min assist         General transfer comment: min A for power up and steadying, pt moaning with weightbearing through R LE, complaints of pain and numbness and tingling  Ambulation/Gait             General Gait Details: deferred due to R heel pain,         Balance Overall balance assessment: Needs assistance   Sitting balance-Leahy Scale: Fair       Standing balance-Leahy Scale: Poor                               Pertinent Vitals/Pain Pain Assessment: Faces Faces Pain Scale: Hurts even more Pain Location: R foot/ankle with weight bearing, Pain Descriptors / Indicators: Grimacing;Guarding;Cramping;Moaning Pain Intervention(s): Limited activity within patient's tolerance;Monitored during session;Repositioned    Home Living Family/patient expects to be discharged to:: Private residence Living Arrangements: Other relatives Available Help at Discharge: Family;Personal care attendant;Available 24 hours/day Type of Home: House Home Access: Stairs to enter Entrance Stairs-Rails: None Entrance Stairs-Number of Steps: 3 Home Layout: One level Home Equipment: Walker - 2 wheels;Shower seat      Prior Function Level of Independence: Needs assistance   Gait / Transfers Assistance Needed: Ususally  does not use a RW; has starte using a RW withint he past week  ADL's / Homemaking Assistance Needed: Aide/sister assists with all ADL tasks  Comments: Spends most of his time in the bed and gets up when he "wants to " per sister     Hand Dominance   Dominant Hand: Right    Extremity/Trunk Assessment   Upper Extremity  Assessment Upper Extremity Assessment: Defer to OT evaluation    Lower Extremity Assessment Lower Extremity Assessment: RLE deficits/detail;LLE deficits/detail RLE Deficits / Details: R ankle lacking active dorsiflexion, 2/5 strength, plantarflexion, knee ext/flex, and hip flex 3+/5 RLE Sensation: history of peripheral neuropathy (mid calf to foot) LLE Deficits / Details: generalized weakness, ROM WFL LLE Sensation: history of peripheral neuropathy (mid calf down)    Cervical / Trunk Assessment Cervical / Trunk Assessment: Kyphotic (forward head)  Communication   Communication: Tracheostomy;Passy-Muir valve  Cognition Arousal/Alertness: Awake/alert Behavior During Therapy: Flat affect Overall Cognitive Status: Difficult to assess                                 General Comments: most likely at baseline      General Comments General comments (skin integrity, edema, etc.): Pt sister in room assists with Home setup and PLOF information. Pt noted to have very tight heel cord in standing from longterm lack of dorsiflexion, pt would benefit from    Exercises Other Exercises Other Exercises: heel cord stretch with sheet   Assessment/Plan    PT Assessment Patient needs continued PT services  PT Problem List Decreased strength;Decreased range of motion;Decreased activity tolerance;Decreased balance;Decreased mobility;Decreased cognition;Pain       PT Treatment Interventions DME instruction;Gait training;Functional mobility training;Stair training;Therapeutic activities;Therapeutic exercise;Balance training;Cognitive remediation;Patient/family education    PT Goals (Current goals can be found in the Care Plan section)  Acute Rehab PT Goals Patient Stated Goal: agreeable to follow up [facility] therapy PT Goal Formulation: With patient/family Time For Goal Achievement: 10/27/21 Potential to Achieve Goals: Good    Frequency Min 3X/week   Barriers to discharge         Co-evaluation PT/OT/SLP Co-Evaluation/Treatment: Yes Reason for Co-Treatment: For patient/therapist safety PT goals addressed during session: Mobility/safety with mobility OT goals addressed during session: ADL's and self-care       AM-PAC PT "6 Clicks" Mobility  Outcome Measure Help needed turning from your back to your side while in a flat bed without using bedrails?: None Help needed moving from lying on your back to sitting on the side of a flat bed without using bedrails?: None Help needed moving to and from a bed to a chair (including a wheelchair)?: A Little Help needed standing up from a chair using your arms (e.g., wheelchair or bedside chair)?: A Little Help needed to walk in hospital room?: A Lot Help needed climbing 3-5 steps with a railing? : A Lot 6 Click Score: 18    End of Session   Activity Tolerance: Patient limited by pain Patient left: in bed;with call bell/phone within reach;with bed alarm set Nurse Communication: Mobility status;Other (comment) (order for AFO) PT Visit Diagnosis: Unsteadiness on feet (R26.81);Muscle weakness (generalized) (M62.81);Pain Pain - Right/Left: Right Pain - part of body: Leg;Ankle and joints of foot    Time: 1421-1500 PT Time Calculation (min) (ACUTE ONLY): 39 min   Charges:   PT Evaluation $PT Eval Moderate Complexity: 1 Mod  Hiilani Jetter B. Migdalia Dk PT, DPT Acute Rehabilitation Services Pager [phone] Office [phone]   [place] 10/13/2021, 3:53 PM

## 2021-10-13 NOTE — Evaluation (Signed)
Occupational Therapy Evaluation Patient Details Name: Perry Waller MRN: [id number] DOB: [birth year] Today's Date: 10/13/2021   History of Present Illness 60 yo presenting with syncopal episode, and admitted for aspiration pneumonia. Had poor p.o. intake leading to his presentation. PMH of laryngeal cancer s/p tracheostomy, severe protein calorie malnutrition, orthostatic hypotension, COPD, emphysema and tobacco use.   Clinical Impression   PTA pt lives with his sister and has an aide 8 hrs/day 7 days/wk. Sister/family is home with pt at other times. BP stable during session with Max HR 138 with standing. Pt with increased pain R ankle due to what appears to be heel cord tightness (see PT note), which limited mobility. No complaints of dizziness with mobility. Will order [facility] for R foot to be worn when in bed - may be removed PRN for comfort. PT agreeable to wear splint. If an option, recommend HHOT. Will follow acutely to facilitate safe DC home.      Recommendations for follow up therapy are one component of a multi-disciplinary discharge planning process, led by the attending physician.  Recommendations may be updated based on patient status, additional functional criteria and insurance authorization.   Follow Up Recommendations  Home health OT    Equipment Recommendations  Wheelchair (measurements OT);Wheelchair cushion (measurements OT)    Recommendations for Other Services       Precautions / Restrictions Precautions Precautions: Fall Precaution Comments: pain R ankle with standing  - most likley due to tight achilles      Mobility Bed Mobility Overal bed mobility: Needs Assistance Bed Mobility: Supine to Sit;Sit to Supine     Supine to sit: Supervision Sit to supine: Supervision        Transfers Overall transfer level: Needs assistance Equipment used: Rolling walker (2 wheeled) Transfers: Sit to/from Stand Sit to Stand: Min assist              Balance Overall  balance assessment: Needs assistance   Sitting balance-Leahy Scale: Fair       Standing balance-Leahy Scale: Poor                             ADL either performed or assessed with clinical judgement   ADL Overall ADL's : Needs assistance/impaired Eating/Feeding: Minimal assistance (poor)   Grooming: Set up   Upper Body Bathing: Set up   Lower Body Bathing: Moderate assistance   Upper Body Dressing : Minimal assistance   Lower Body Dressing: Moderate assistance;Sit to/from stand   Toilet Transfer: Minimal assistance;RW   Toileting- Clothing Manipulation and Hygiene: Moderate assistance       Functional mobility during ADLs: Minimal assistance;Rolling walker;Cueing for safety General ADL Comments: easily fatigues     Vision         Perception     Praxis      Pertinent Vitals/Pain Pain Assessment: Faces Faces Pain Scale: Hurts even more Pain Location: R foot/ankle with weight bearing Pain Descriptors / Indicators: Grimacing;Guarding;Cramping Pain Intervention(s): Limited activity within patient's tolerance;Repositioned     Hand Dominance Right   Extremity/Trunk Assessment Upper Extremity Assessment Upper Extremity Assessment: Generalized weakness (BUE AROM WFL; has neuropathy B hands "they asleep". B hand intrinsic wasting)   Lower Extremity Assessment Lower Extremity Assessment: Defer to PT evaluation   Cervical / Trunk Assessment Cervical / Trunk Assessment: Kyphotic (forward head)   Communication Communication Communication: Tracheostomy;Passy-Muir valve   Cognition Arousal/Alertness: Awake/alert Behavior During Therapy: Flat affect Overall Cognitive Status: Difficult  to assess                                 General Comments: most likely at baseline   General Comments       Exercises     Shoulder Instructions      Home Living Family/patient expects to be discharged to:: Private residence Living Arrangements:  Other relatives Available Help at Discharge: Family;Personal care attendant;Available 24 hours/day Type of Home: House Home Access: Stairs to enter [employer] of Steps: 3 Entrance Stairs-Rails: None Home Layout: One level     Bathroom Shower/Tub: Corporate investment banker: Standard Bathroom Accessibility: Yes How Accessible: Accessible via walker Home Equipment: [place] - 2 wheels;Shower seat          Prior Functioning/Environment Level of Independence: Needs assistance  Gait / Transfers Assistance Needed: Ususally does not use a RW; has starte using a RW withint he past week ADL's / Homemaking Assistance Needed: Aide/sister assists with all ADL tasks Communication / Swallowing Assistance Needed: [name] Comments: Spends most of his time in the bed and gets up when he "wants to " per sister        OT Problem List: Decreased strength;Decreased range of motion;Decreased activity tolerance;Impaired balance (sitting and/or standing);Decreased safety awareness;Decreased knowledge of use of DME or AE;Pain      OT Treatment/Interventions: Self-care/ADL training;Therapeutic exercise;Energy conservation;DME and/or AE instruction;Therapeutic activities;Patient/family education;Balance training    OT Goals(Current goals can be found in the care plan section) Acute Rehab OT Goals Patient Stated Goal: agreeable to follow up [facility] therapy OT Goal Formulation: With patient Time For Goal Achievement: 10/27/21 Potential to Achieve Goals: Good  OT Frequency: Min 2X/week   Barriers to D/C:            Co-evaluation PT/OT/SLP Co-Evaluation/Treatment: Yes Reason for Co-Treatment: For patient/therapist safety;Necessary to address cognition/behavior during functional activity   OT goals addressed during session: ADL's and self-care      AM-PAC OT "6 Clicks" Daily Activity     Outcome Measure Help from another person eating meals?: A Little Help from another person  taking care of personal grooming?: A Little Help from another person toileting, which includes using toliet, bedpan, or urinal?: A Lot Help from another person bathing (including washing, rinsing, drying)?: A Lot Help from another person to put on and taking off regular upper body clothing?: A Little Help from another person to put on and taking off regular lower body clothing?: A Lot 6 Click Score: 15   End of Session Equipment Utilized During Treatment: Gait belt;Rolling walker Nurse Communication: Mobility status  Activity Tolerance: Patient tolerated treatment well Patient left: in bed;with call bell/phone within reach;with bed alarm set;with family/visitor present  OT Visit Diagnosis: Unsteadiness on feet (R26.81);Muscle weakness (generalized) (M62.81);Pain Pain - Right/Left: Right Pain - part of body: Ankle and joints of foot                Time: 1420-1454 OT Time Calculation (min): 34 min Charges:  OT General Charges $OT Visit: 1 Visit OT Evaluation $OT Eval Moderate Complexity: [place], OT/L   Acute OT Clinical Specialist Acute Rehabilitation Services Pager [phone] Office [phone]   [facility] 10/13/2021, 3:13 PM
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
## 2021-10-13 NOTE — Progress Notes (Signed)
Spoken to RN regarding ordering Inner Cannulas for patient trach. None at bedside.

## 2021-10-13 NOTE — Plan of Care (Signed)
Dose of metoprolol skipped as vitals normal and originally came in hypotensive in ED and on midodrine. Episode of emesis/aspiration at beginning of shift. Output noted to be characteristic of the chocolate Ensure patient previously drank. Refused trach suctioning but was cleaned up and provided oral suction. Coughing several minutes before settling. Provider notified. New NPO order until can be seen by SLP. Intermittently a little irritable not wanting to be disturbed. Briefly explained routine of hospital care and what to expect. Anticipates sister visiting today who he states is his biggest support.    Problem: Education: Goal: Knowledge of General Education information will improve Description: Including pain rating scale, medication(s)/side effects and non-pharmacologic comfort measures Outcome: Progressing   Problem: Health Behavior/Discharge Planning: Goal: Ability to manage health-related needs will improve Outcome: Progressing   Problem: Clinical Measurements: Goal: Ability to maintain clinical measurements within normal limits will improve Outcome: Progressing Goal: Will remain free from infection Outcome: Progressing Goal: Diagnostic test results will improve Outcome: Progressing Goal: Respiratory complications will improve Outcome: Progressing Goal: Cardiovascular complication will be avoided Outcome: Progressing   Problem: Activity: Goal: Risk for activity intolerance will decrease Outcome: Progressing   Problem: Nutrition: Goal: Adequate nutrition will be maintained Outcome: Progressing   Problem: Coping: Goal: Level of anxiety will decrease Outcome: Progressing   Problem: Elimination: Goal: Will not experience complications related to bowel motility Outcome: Progressing Goal: Will not experience complications related to urinary retention Outcome: Progressing   Problem: Pain Managment: Goal: General experience of comfort will improve Outcome: Progressing    Problem: Safety: Goal: Ability to remain free from injury will improve Outcome: Progressing   Problem: Skin Integrity: Goal: Risk for impaired skin integrity will decrease Outcome: Progressing   Problem: Activity: Goal: Ability to tolerate increased activity will improve Outcome: Progressing   Problem: Clinical Measurements: Goal: Ability to maintain a body temperature in the normal range will improve Outcome: Progressing   Problem: Respiratory: Goal: Ability to maintain adequate ventilation will improve Outcome: Progressing Goal: Ability to maintain a clear airway will improve Outcome: Progressing

## 2021-10-13 NOTE — Progress Notes (Signed)
ATC set up. Patient initially had it on but later decided not to use it. Refer to flowsheet for trends for on and off ATC. Educated patient on the benefits that humidification can provide for a trach. To help provide humidity to the airways and also not dry up secretions. Patient verbalizes understanding via inline PMV and refuses to wear it at this time. ATC at bedside on stand-by if needed.   Perry Waller L. Tamala Julian, BS, RRT-ACCS, RCP

## 2021-10-14 ENCOUNTER — Inpatient Hospital Stay (HOSPITAL_COMMUNITY): Payer: Medicaid Other

## 2021-10-14 DIAGNOSIS — J690 Pneumonitis due to inhalation of food and vomit: Secondary | ICD-10-CM | POA: Diagnosis not present

## 2021-10-14 DIAGNOSIS — F17200 Nicotine dependence, unspecified, uncomplicated: Secondary | ICD-10-CM

## 2021-10-14 DIAGNOSIS — D649 Anemia, unspecified: Secondary | ICD-10-CM | POA: Diagnosis not present

## 2021-10-14 DIAGNOSIS — J432 Centrilobular emphysema: Secondary | ICD-10-CM | POA: Diagnosis not present

## 2021-10-14 DIAGNOSIS — R1312 Dysphagia, oropharyngeal phase: Secondary | ICD-10-CM

## 2021-10-14 DIAGNOSIS — Z66 Do not resuscitate: Secondary | ICD-10-CM | POA: Diagnosis not present

## 2021-10-14 LAB — CBC
HCT: 25.7 % — ABNORMAL LOW (ref 39.0–52.0)
Hemoglobin: 7.7 g/dL — ABNORMAL LOW (ref 13.0–17.0)
MCH: 25.3 pg — ABNORMAL LOW (ref 26.0–34.0)
MCHC: 30 g/dL (ref 30.0–36.0)
MCV: 84.5 fL (ref 80.0–100.0)
Platelets: 389 10*3/uL (ref 150–400)
RBC: 3.04 MIL/uL — ABNORMAL LOW (ref 4.22–5.81)
RDW: 15.9 % — ABNORMAL HIGH (ref 11.5–15.5)
WBC: 8.3 10*3/uL (ref 4.0–10.5)
nRBC: 0 % (ref 0.0–0.2)

## 2021-10-14 LAB — RENAL FUNCTION PANEL
Albumin: 1.9 g/dL — ABNORMAL LOW (ref 3.5–5.0)
Anion gap: 7 (ref 5–15)
BUN: 9 mg/dL (ref 6–20)
CO2: 29 mmol/L (ref 22–32)
Calcium: 8.2 mg/dL — ABNORMAL LOW (ref 8.9–10.3)
Chloride: 102 mmol/L (ref 98–111)
Creatinine, Ser: 0.6 mg/dL — ABNORMAL LOW (ref 0.61–1.24)
GFR, Estimated: >60 mL/min (ref >60)
Glucose, Bld: 118 mg/dL — ABNORMAL HIGH (ref 70–99)
Phosphorus: 3.2 mg/dL (ref 2.5–4.6)
Potassium: 3.9 mmol/L (ref 3.5–5.1)
Sodium: 138 mmol/L (ref 135–145)

## 2021-10-14 LAB — MAGNESIUM: Magnesium: 1.9 mg/dL (ref 1.7–2.4)

## 2021-10-14 MED ORDER — AMOXICILLIN-POT CLAVULANATE 500-125 MG PO TABS: 1.0000 | ORAL_TABLET | Freq: Two times a day (BID) | ORAL | 0 refills | Status: AC

## 2021-10-14 MED ORDER — PANTOPRAZOLE SODIUM 40 MG PO TBEC: 40.0000 mg | DELAYED_RELEASE_TABLET | Freq: Every day | ORAL | 0 refills | Status: AC

## 2021-10-14 MED ORDER — POLYETHYLENE GLYCOL 3350 17 GM/SCOOP PO POWD
17.0000 g | Freq: Two times a day (BID) | ORAL | 1 refills | Status: AC | PRN
Start: 2021-10-14 — End: ?

## 2021-10-14 MED ORDER — AZITHROMYCIN 250 MG PO TABS: 250.0000 mg | ORAL_TABLET | Freq: Every day | ORAL | 0 refills | Status: DC

## 2021-10-14 MED ORDER — STIOLTO RESPIMAT 2.5-2.5 MCG/ACT IN AERS: 2.0000 | INHALATION_SPRAY | Freq: Every day | RESPIRATORY_TRACT | 6 refills | Status: DC

## 2021-10-14 MED ORDER — ADULT MULTIVITAMIN W/MINERALS CH: 1.0000 | ORAL_TABLET | Freq: Every day | ORAL | Status: DC

## 2021-10-14 MED ORDER — MIDODRINE HCL 10 MG PO TABS: 10.0000 mg | ORAL_TABLET | Freq: Three times a day (TID) | ORAL | 1 refills | Status: AC

## 2021-10-14 MED ORDER — ADULT MULTIVITAMIN W/MINERALS CH
1.0000 | ORAL_TABLET | Freq: Every day | ORAL | Status: DC
  Administered 2021-10-14: 1 via ORAL
  Filled 2021-10-14: qty 1

## 2021-10-14 NOTE — Progress Notes (Signed)
RT NOTES: Pt not in room for 0800 trach assessment. Will check back later.

## 2021-10-14 NOTE — Progress Notes (Signed)
Modified Barium Swallow Progress Note  Patient Details  Name: Dierks Wach MRN: [id number] Date of Birth: 28-Dec-1961  Today's Date: 10/14/2021  Modified Barium Swallow completed.  Full report located under Chart Review in the Imaging Section.  Brief recommendations include the following:  Clinical Impression  Pt demonstrates a worsening of oropharyngeal dysphagia since last MBS, particularly noted to have decreased ability to cough with enough force to eject aspirate ([name] in place). Pt senses most aspiration of significant quantity. Primary impairment is decreased laryngeal closure during consumption of larger consecutive boluses. Pt piecemeals bolus and swallows initial bolus adequately, but the second bolus is penetrated above closed glottis with subsequent UES opening. When glottic hold released, pt aspirates penetrate (both nectar and thin). Also aspirates mild oral residue with thin. Careful strategies such as small single sips with an oral hold, breath hold and swallow with a throat clear (super supraglottic swallow) are effective, but not reliable at this time. Single sips of nectar are tolerated well. Pt recommended to consume nectar thick liquids and regular solids at this time with f/u SLP interventions to address strategies. Also in comparing prior studies with this one, pt observed to ahve fullness of pyriform tissue and also UES. May be slightly more prominent this study. Reported to MD   Swallow Evaluation Recommendations   Recommended Consults: Consider ENT evaluation   SLP Diet Recommendations: Regular solids;Nectar thick liquid   Liquid Administration via: Cup;Straw   Medication Administration: Whole meds with puree   Supervision: Patient able to self feed;Intermittent supervision to cue for compensatory strategies   Compensations: Slow rate;Small sips/bites;Minimize environmental distractions;Clear throat after each swallow;Multiple dry swallows after each bite/sip    Postural Changes: Seated upright at 90 degrees;Remain semi-upright after after feeds/meals (Comment)   Oral Care Recommendations: Oral care BID   Other Recommendations: Place [name] during PO intake    Ankith Ed[place], Katherene Ponto 10/14/2021,9:16 AM

## 2021-10-14 NOTE — TOC Initial Note (Signed)
Transition of Care [facility]) - Initial/Assessment Note    Patient Details  Name: Perry Waller MRN: [id number] Date of Birth: [birth year]  Transition of Care [facility]) CM/SW Contact:    Bethena Roys, RN Phone Number: 10/14/2021, 11:36 AM  Clinical Narrative:  Plan will be for transition home today. Case Manager spoke with Deidre Ala with OT and she recommends a wheelchair for home. Patient is agreeable to have wheelchair delivered to the room from [place]. Patient is agreeable to [facility] Services: PT,OT,RN, SLP via [place]- referral submitted to [facility] and she is checking to see if the agency can service the patient. Patient receives Aide services in the home 7 days a week for 8 hours daily. Sister states she will provide transportation home via private vehicle once she gets off work. Sister states she will arrive around 7:30 and she will call the unit to make them aware of ETA. Case Manager is awaiting to hear back from [facility]. No further needs identified from Case Manager at this time.                Expected Discharge Plan: [place] Barriers to Discharge: No Barriers Identified   Patient Goals and CMS Choice Patient states their goals for this hospitalization and ongoing recovery are:: to return home. CMS Medicare.gov Compare Post Acute Care list provided to:: Patient Choice offered to / list presented to : Patient, Sibling (Spoke with patient and sister.)  Expected Discharge Plan and Services Expected Discharge Plan: [place] In-house Referral: NA Discharge Planning Services: CM Consult Post Acute Care Choice: [place] arrangements for the past 2 months: Single Family Home Expected Discharge Date: 10/14/21               DME Arranged: Youth worker wheelchair with seat cushion DME Agency: [employer] Date DME Agency Contacted: 10/14/21 Time DME Agency Contacted: 71 Representative spoke with at DME Agency: [facility]  Arranged: PT, OT, RN, Speech Therapy [place] Agency: [place] ([place]) Date HH Agency Contacted: 10/14/21 Time HH Agency Contacted: 1100 Representative spoke with at [place]: Ramond Marrow  Prior Living Arrangements/Services Living arrangements for the past 2 months: [place] with:: Self, Siblings Patient language and need for interpreter reviewed:: Yes Do you feel safe going back to the place where you live?: Yes      Need for Family Participation in Patient Care: Yes (Comment) Care giver support system in place?: Yes (comment) Current home services: Homehealth aide (Patient has an aide that comes in 8 hours day for 7 days.) Criminal Activity/Legal Involvement Pertinent to Current Situation/Hospitalization: No - Comment as needed  Activities of Daily Living      Permission Sought/Granted Permission sought to share information with : Facility Sport and exercise psychologist, Case Optician, dispensing granted to share information with : Yes, Verbal Permission Granted     Permission granted to share info w AGENCY: [facility]        Emotional Assessment Appearance:: Appears stated age Attitude/Demeanor/Rapport: Engaged Affect (typically observed): Appropriate Orientation: : Oriented to  Time, Oriented to Place, Oriented to Situation, Oriented to Self Alcohol / Substance Use: Not Applicable Psych Involvement: No (comment)  Admission diagnosis:  Aspiration pneumonia ([place]) [J69.0] Syncope [R55] Patient Active Problem List   Diagnosis Date Noted   Aspiration pneumonia ([place]) 10/12/2021   COPD (chronic obstructive pulmonary disease) ([place])    Chronic anemia    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 [place] [address] Phone: [phone] Fax: [phone]  [facility] [address] [place] [address] Phone: [phone] Fax: [phone]   Readmission Risk Interventions No flowsheet data found.

## 2021-10-14 NOTE — Progress Notes (Signed)
Initial Nutrition Assessment  DOCUMENTATION CODES:  Severe malnutrition in context of chronic illness, Underweight  INTERVENTION:  Downgrade diet to Dysphagia 3.  Thicken Ensure to ordered diet consistency per SLP.  Continue Ensure TID.  Add Magic cup TID with meals, each supplement provides 290 kcal and 9 grams of protein.  Add MVI with minerals daily.  Encourage PO intake.  NUTRITION DIAGNOSIS: Severe Malnutrition related to chronic illness, cancer and cancer related treatments as evidenced by severe fat depletion, severe muscle depletion, percent weight loss.  GOAL:  Patient will meet greater than or equal to 90% of their needs  MONITOR:  PO intake, Supplement acceptance, Labs, Weight trends, I & O's  REASON FOR ASSESSMENT:  Consult Assessment of nutrition requirement/status  ASSESSMENT:  60 yo male with a PMH of laryngeal cancer s/p tracheostomy, severe protein calorie malnutrition, orthostatic hypotension, emphysema and tobacco use who presents with aspiration PNA. 10/16 - emesis x1  Spoke with pt at bedside. Pt reports that his breakfast was too hard and needs to be softer. RD to order Dysphagia 3 diet for smaller, softer foods for pt.  Per SLP note today, pt to receive regular solids and nectar-thick liquids. Please thicken Ensure to appropriate, ordered consistency.  Weight appears to be on an uptrend, per Epic. However, pt has still lost ~7.5 lbs (7.5%) in the last 4 months, which is significant and severe for the time frame.  Recommend continuing Ensure TID, and adding Magic Cup TID and MVI with minerals daily.  Supplements: Ensure TID  Medications: reviewed; midodrine TID, Remeron, Protonix  Labs: reviewed; Glucose 118 (H)  NUTRITION - FOCUSED PHYSICAL EXAM: Flowsheet Row Most Recent Value  Orbital Region Severe depletion  Upper Arm Region Severe depletion  Thoracic and Lumbar Region Severe depletion  Buccal Region Severe depletion  Temple Region  Severe depletion  Clavicle Bone Region Severe depletion  Clavicle and Acromion Bone Region Severe depletion  Scapular Bone Region Severe depletion  Dorsal Hand Severe depletion  Patellar Region Severe depletion  Anterior Thigh Region Severe depletion  Posterior Calf Region Severe depletion  Edema (RD Assessment) None  Hair Reviewed  Eyes Reviewed  Mouth Reviewed  Skin Reviewed  Nails Reviewed   Diet Order:   Diet Order             DIET DYS 3 Room service appropriate? Yes; Fluid consistency: Thin  Diet effective now           Diet general                  EDUCATION NEEDS:  Education needs have been addressed  Skin:  Skin Assessment: Reviewed RN Assessment  Last BM:  10/14/21  Height:  Ht Readings from Last 1 Encounters:  10/11/21 5\' 8"  (1.727 m)   Weight:  Wt Readings from Last 1 Encounters:  10/14/21 42.3 kg   BMI:  Body mass index is 14.18 kg/m.  Estimated Nutritional Needs:  Kcal:  2200-2400 Protein:  65-80 grams Fluid:  >2.2 L  Derrel Nip, RD, LDN (she/her/hers) Registered Dietitian I After-Hours/Weekend Pager # in [place]

## 2021-10-14 NOTE — Progress Notes (Signed)
Occupational Therapy Treatment Patient Details Name: Perry Waller MRN: [id number] DOB: Feb 07, 1961 Today's Date: 10/14/2021   History of present illness (P) 60 yo presenting with syncopal episode, and admitted for aspiration pneumonia. Had poor p.o. intake leading to his presentation. PMH of laryngeal cancer s/p tracheostomy, severe protein calorie malnutrition, orthostatic hypotension, COPD, emphysema and tobacco use.   OT comments  Pt fitted with PRAFO (R) due to footdrop as noted below. Pt verbalized understanding. Spoke with CM over the phone while in room and confirmed DC needs. Recommend wc due to weakness and pain with mobility. Will defer further OT to [facility].    Recommendations for follow up therapy are one component of a multi-disciplinary discharge planning process, led by the attending physician.  Recommendations may be updated based on patient status, additional functional criteria and insurance authorization.    Follow Up Recommendations  (P) Home health OT    Equipment Recommendations  (P) Wheelchair (measurements OT);Wheelchair cushion (measurements OT)    Recommendations for Other Services      Precautions / Restrictions Precautions Precautions: (P) Fall Precaution Comments: (P) pain R ankle with standing  - most likley due to tight achilles Restrictions Weight Bearing Restrictions: No       Mobility Bed Mobility                    Transfers                      Balance                                           ADL either performed or assessed with clinical judgement   ADL                                               Vision       Perception     Praxis      Cognition Arousal/Alertness: (P) Awake/alert Behavior During Therapy: (P) Flat affect Overall Cognitive Status: (P) Difficult to assess                                 General Comments: (P) most likely at baseline         Exercises     Shoulder Instructions       General Comments (P) Pt fitted with R RPAFO. Educated pt on purpose of and how to donn/doff Prafo and wearing time. Pt verbalized understanding. Written instructions provided.    Pertinent Vitals/ Pain       Pain Assessment: (P) Faces Faces Pain Scale: (P) Hurts a little bit Pain Location: (P) R foot/ankle with weight bearing, Pain Descriptors / Indicators: (P) Grimacing;Guarding;Cramping;Moaning Pain Intervention(s): (P) Limited activity within patient's tolerance  Home Living                                          Prior Functioning/Environment              Frequency  (P) Min 2X/week        Progress Toward Goals  OT  Goals(current goals can now be found in the care plan section)  Progress towards OT goals: (P) Progressing toward goals  Acute Rehab OT Goals Patient Stated Goal: (P) agreeable to follow up [facility] therapy OT Goal Formulation: (P) With patient Time For Goal Achievement: (P) 10/27/21 Potential to Achieve Goals: (P) Good ADL Goals Pt Will Perform Lower Body Bathing: (P) sit to/from stand;with min assist Pt Will Transfer to Toilet: (P) with supervision;ambulating Pt Will Perform Toileting - Clothing Manipulation and hygiene: (P) with modified independence Additional ADL Goal #1: (P) Pt/family will be independent in donning/doffing R PRAFO to improve dorsiflexion positioning due to apparent footdrop Additional ADL Goal #2: (P) Pt/family will independently verbalize 3 strategies to reduce risk of falls  Plan (P) Discharge plan remains appropriate    Co-evaluation                 AM-PAC OT "6 Clicks" Daily Activity     Outcome Measure   Help from another person eating meals?: (P) A Little Help from another person taking care of personal grooming?: (P) A Little Help from another person toileting, which includes using toliet, bedpan, or urinal?: (P) A Lot Help from another person bathing  (including washing, rinsing, drying)?: (P) A Lot Help from another person to put on and taking off regular upper body clothing?: (P) A Little Help from another person to put on and taking off regular lower body clothing?: (P) A Lot 6 Click Score: (P) 15    End of Session    OT Visit Diagnosis: (P) Unsteadiness on feet (R26.81);Muscle weakness (generalized) (M62.81);Pain Pain - Right/Left: (P) Right Pain - part of body: (P) Ankle and joints of foot   Activity Tolerance (P) Patient tolerated treatment well   Patient Left (P) in bed;with call bell/phone within reach;with bed alarm set;with nursing/sitter in room   Nurse Communication (P) Mobility status;Other (comment) (DC needs)        Time: (P) 1012-(P) 1030 OT Time Calculation (min): (P) 18 min  Charges: OT General Charges $OT Visit: (P) 1 Visit OT Treatments $Self Care/Home Management : (P) 8-22 mins  Maurie Boettcher, OT/L   Acute OT Clinical Specialist Acute Rehabilitation Services Pager [phone] Office [phone]   [facility] 10/14/2021, 10:43 AM

## 2021-10-14 NOTE — Care Management (Cosign Needed)
    Durable Medical Equipment  (From admission, onward)           Start     Ordered   10/14/21 0718  For home use only DME Other see comment  Once       Comments: Right side AFO  Question:  Length of Need  Answer:  Lifetime   10/14/21 0718   10/14/21 0717  For home use only DME standard manual wheelchair with seat cushion  Once       Comments: Patient suffers from extremity weakness which impairs their ability to perform daily activities like bathing, dressing, feeding, grooming, and toileting in the home.  A cane, crutch, or walker will not resolve issue with performing activities of daily living. A wheelchair will allow patient to safely perform daily activities. Patient can safely propel the wheelchair in the home or has a caregiver who can provide assistance. Length of need Lifetime. Accessories: elevating leg rests (ELRs), wheel locks, extensions and anti-tippers.   10/14/21 [id number]

## 2021-10-14 NOTE — Progress Notes (Signed)
Orthopedic Tech Progress Note Patient Details:  Perry Waller 1961/08/14 [id number]  Ortho Devices Type of Ortho Device: Prafo boot/shoe Ortho Device/Splint Location: RLE Ortho Device/Splint Interventions: Ordered   Post Interventions Patient Tolerated: Well Instructions Provided: Care of device  Janit Pagan 10/14/2021, 10:19 AM

## 2021-10-14 NOTE — Plan of Care (Signed)

## 2021-10-14 NOTE — Discharge Summary (Signed)
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 0329 10/13/21 0248 10/14/21 0148  AST 18 14*  --   --   ALT 9 8  --   --   ALKPHOS 72 60  --   --   BILITOT 0.4 0.5  --   --   PROT 7.4 6.4*  --   --   ALBUMIN 2.5* 2.0* 1.9* 1.9*   No results for input(s): LIPASE, AMYLASE in the last 168 hours. No results for input(s): AMMONIA in the last 168 hours. Diabetic: No results for input(s): HGBA1C in the last 72 hours. No results for input(s): GLUCAP in the last 168 hours. Cardiac Enzymes: No results for input(s): CKTOTAL, CKMB, CKMBINDEX, TROPONINI in the last 168 hours. No results for input(s): PROBNP in the last 8760 hours. Coagulation Profile: No results for input(s): INR, PROTIME in the last 168 hours. Thyroid Function Tests: No results for input(s): TSH, T4TOTAL, FREET4, T3FREE, THYROIDAB in the  last 72 hours. Lipid Profile: No results for input(s): CHOL, HDL, LDLCALC, TRIG, CHOLHDL, LDLDIRECT in the last 72 hours. Anemia Panel: Recent Labs    10/13/21 0248  VITAMINB12 559  FOLATE 10.2  FERRITIN 362*  TIBC 165*  IRON 14*  RETICCTPCT 2.3   Urine analysis:    Component Value Date/Time   COLORURINE YELLOW 06/09/2021 0520   APPEARANCEUR CLOUDY (A) 06/09/2021 0520   LABSPEC 1.018 06/09/2021 0520   PHURINE 9.0 (H) 06/09/2021 0520   GLUCOSEU NEGATIVE 06/09/2021 0520   HGBUR NEGATIVE 06/09/2021 0520   BILIRUBINUR NEGATIVE 06/09/2021 0520   KETONESUR NEGATIVE 06/09/2021 0520   PROTEINUR 30 (A) 06/09/2021 0520   UROBILINOGEN 0.2 10/17/2011 2052   NITRITE NEGATIVE 06/09/2021 0520   LEUKOCYTESUR LARGE (A) 06/09/2021 0520   Sepsis Labs: Invalid input(s): PROCALCITONIN, LACTICIDVEN   Time coordinating discharge: 55 minutes  SIGNED:  Mercy Riding, MD  [facility] 10/14/2021, 4:10 PM

## 2021-10-15 ENCOUNTER — Telehealth: Payer: Self-pay

## 2021-10-15 NOTE — Telephone Encounter (Signed)
Transition Care Management Follow-up Telephone Call  Call completed with patient's sister, Perry Waller. Date of discharge and from where: 10/14/2021, [facility] How have you been since you were released from the hospital? She said she was at work but her brother was not at home alone. Perry Waller works until 1900 but he has a Marine scientist with him until 73 and then [employer] husband is home with the patient until she returns home from work. She was not able to clarify if the " nurse" she has been referring to is a nurse or nurse's aide.  Any questions or concerns? Yes- she is concerned about his swallowing. SLP has been ordered through [place]. She wants to make sure that the pneumonia is resolving.   Items Reviewed: Did the pt receive and understand the discharge instructions provided? Yes - Perry Waller said that she has them and the nurse reviewed them with her. She Didn't have any questions about the instructions at this time.  Medications obtained and verified?  She said that she still needs to pick up the new medications that were ordered in addition to the duoneb. He has all of the other medications.  Perry Waller manages the meds with the assistance of the nurse who gives the patient his meds when Perry Waller is not home.  Other? No  Any new allergies since your discharge? No  Dietary orders reviewed? Yes and she said he will eat anything he wants to eat.  Do you have support at home? Yes  - she said he is never left alone.  He has the nurse for 8 hours/day x 7 days/week.   Home Care and Equipment/Supplies: Were home health services ordered? yes If so, what is the name of the agency? [facility]  Has the agency set up a time to come to the patient's home? Yes - the PT came out to see patient today Were any new equipment or medical supplies ordered?  Yes: wheelchair , right AFO What is the name of the medical supply agency? [facility]  Were you able to get the  supplies/equipment? yes Do you have any questions related to the use of the equipment or supplies? No  He already has a walker, cane, nebulizer, trach supplies and suction equipment.  Perry Waller said that she does not suction patient, his nurse does.   Functional Questionnaire: (I = Independent and D = Dependent) ADLs- needs assistance with personal care, meal preparation. Dependant on his sister and nurse for medication management.   Follow up appointments reviewed:  PCP Hospital f/u appt confirmed? Yes  Scheduled to see Dr Perry Waller  on 10/22/2021 @ 1050. [place] [facility] f/u appt confirmed?  Nothing scheduled until oncology 03/25/2021   Are transportation arrangements needed? No  If their condition worsens, is the pt aware to call PCP or go to the Emergency Dept.? Yes Was the patient provided with contact information for the PCP's office or ED? Yes Was to pt encouraged to call back with questions or concerns? Yes

## 2021-10-15 NOTE — Telephone Encounter (Signed)
From the discharge call.  This call was completed with patient's sister, Orlie Pollen.   She said she was at work but her brother was not at home alone. Perry Waller works until 1900 but he has a Marine scientist with him until 19 and then [employer] husband is home with the patient until she returns home from work. She was not able to clarify if the " nurse" she has been referring to is a nurse or nurse's aide. The nurse sees patient 8 hours/day x 7 days/week.   she is concerned about his swallowing. SLP has been ordered through [place].   She wants to make sure that the pneumonia is resolving.   She said that she still needs to pick up the new medications that were ordered in addition to the duoneb. He has all of the other medications.  Perry [place] manages the meds with the assistance of the nurse who gives the patient his meds when Blain Pais is not home.   He has a wheelchair, right AFO, walker, cane, nebulizer, trach supplies and suction equipment.  Perry Waller said that she does not suction patient, his nurse does. If she has concerns about the trach, she contacts the trach clinic.  Scheduled to see Dr Margarita Rana  on 10/22/2021

## 2021-10-16 ENCOUNTER — Telehealth: Payer: Self-pay | Admitting: Family Medicine

## 2021-10-16 NOTE — Telephone Encounter (Signed)
Copied from [place] [id number]. Topic: Quick Communication - Home Health Verbal Orders >> Oct 15, 2021  1:26 PM Pawlus, Brayton Layman A wrote: [facility]ency[facility]  Callback Number: [phone] Requesting: PT Frequency: 1x4

## 2021-10-16 NOTE — Telephone Encounter (Signed)
Copied from [place] [id number]. Topic: Quick Communication - Home Health Verbal Orders >> Oct 15, 2021  1:26 PM Pawlus, Brayton Layman A wrote: [facility]  Callback Number: [phone] Requesting: PT Aris Everts

## 2021-10-17 NOTE — Telephone Encounter (Signed)
Gerald Stabs was called and verbal orders were given for patient.

## 2021-10-22 ENCOUNTER — Ambulatory Visit: Payer: Medicaid Other | Admitting: Family Medicine

## 2021-10-24 ENCOUNTER — Telehealth: Payer: Self-pay | Admitting: Family Medicine

## 2021-10-24 NOTE — Telephone Encounter (Signed)
Perry Waller, from [facility], calling stating that the pt is declining nursing from them due to having a nurse from [place] who comes by every day. She states that the pt will do PT, but is not needing to have another nurse. Please advise.        [phone]

## 2021-10-30 ENCOUNTER — Telehealth: Payer: Self-pay | Admitting: Pulmonary Disease

## 2021-10-30 ENCOUNTER — Telehealth: Payer: Self-pay | Admitting: Family Medicine

## 2021-10-30 NOTE — Telephone Encounter (Signed)
Called and spoke with [place] from [place]. She stated that Perry Waller was calling to check on the status of the CMN that was sent to our office last week. I verified the fax number that was used. I advised her that I would check with the PCCs to see if they have received this, she verbalized understanding.   Perry Waller, can you please advise if you have received a CMN on this patient for his trach supplies? Thanks.

## 2021-10-30 NOTE — Telephone Encounter (Signed)
Perry Waller calling to ask why Ensure Complete, 3 times a day has not gotten to the pt?Perry Waller does not see any at the house. It is on the plan of care orders dated 10/16/21 and signed 10/18/21.Perry Waller spoke with Perry Waller and you can fax the order to [phone]  Ensure complete 237 ml 90 cans/@ 3 per day  Reason why needs to be on the order: weight gain, complete nutritional requirements Also method of feeding which is PO   Perry Waller cb [phone]

## 2021-10-30 NOTE — Telephone Encounter (Signed)
I have it was waiting on a response back from [place] before I finished it I will fax back today

## 2021-10-31 NOTE — Telephone Encounter (Signed)
Call returned to Randall Hiss, RN/[place] He is requesting an order for Ensure Complete for the patient.  Patient has a difficult time eating and maintaining consistent caloric intake.   Eric contacted [employer], and if the provider is in agreement, he said an order can be faxed to #  [phone]  The order must state the following:   Ensure complete 237 ml 90 cans/@ 3 cans per day Order needs to include reason for the supplement: weight gain, complete nutritional requirements method of feeding : PO    This CM explained that Medicaid may not pay for this because it is po and is a supplement.  The patient is still taking food orally. They generally only pay for tube feedings  when that is the sole source of nutrition. Eric requested that we try to submit the order in the event it would be covered.    Dr Margarita Rana, please advise.

## 2021-11-01 NOTE — Telephone Encounter (Signed)
Can you please write order (use misc order)? I will sign when I am back in the office. Thanks

## 2021-11-04 MED ORDER — MISC. DEVICES MISC: 5 refills | Status: AC

## 2021-11-04 NOTE — Telephone Encounter (Signed)
Done

## 2021-11-04 NOTE — Telephone Encounter (Signed)
Order has been faxed to number provided along with his demographics.

## 2021-11-06 ENCOUNTER — Telehealth: Payer: Self-pay

## 2021-11-06 ENCOUNTER — Ambulatory Visit: Payer: Medicaid Other | Admitting: Physician Assistant

## 2021-11-06 ENCOUNTER — Inpatient Hospital Stay: Payer: Medicaid Other | Admitting: Physician Assistant

## 2021-11-06 NOTE — Telephone Encounter (Signed)
Fax received from [place] noting that the order for the nutritional supplement cannot be processed because the insurance company will only cover tube feedings and the patient takes this orally.    Call placed to Perry Waller, [place] # [phone] to inform him of above and his voicemail was full.   Call placed to patient, spoke to his sister, Perry Waller and explained above. She said that the patient was already aware of that and she is not sure why Perry Waller wanted another order placed.

## 2021-11-06 NOTE — Progress Notes (Deleted)
Patient ID: Perry Waller, male   DOB: 07/07/1961, 60 y.o.   MRN: [id number]   After hospitalization 10/14-10/17/2022  From discharge summary: Hospital Course: 60 year old M with PMH of laryngeal cancer s/p tracheostomy, severe protein calorie malnutrition, orthostatic hypotension, emphysema and tobacco use presenting with what looks like a syncopal episode with shaking, and admitted for aspiration pneumonia.  Had poor p.o. intake leading to his presentation.  CTA chest with RUL consolidation with both areas of nodularity in RML and RLL, and emphysema.  Lactic acid elevated to 2.9.  Pro-Cal 0.16.  Started on IV Unasyn and azithromycin and admitted.   Continued on IV Unasyn and azithromycin.  Symptoms improved.  Evaluated by speech therapy who recommended regular diet with nectar thick liquid diet and aspiration precautions.   Patient is discharged on p.o. Augmentin for 5 more days and azithromycin for 2 more days.   See individual problem list below for more on hospital course.   Discharge Diagnoses:  Aspiration pneumonia-CTA chest with RUL consolidation and multiple nodularities within RML and RLL.  There is also question of circumferential right apical pleural thickening.  This is likely from aspiration pneumonia although it is difficult to rule out malignancy completely given his history.  Improved with IV Unasyn and azithromycin. -Discharged on p.o. Augmentin for 5 more days and azithromycin for 2 more days -Needs repeat CT in 4 to 6 weeks to evaluate response.   Dysphagia: Evaluated by SLP.  Had MBS.  SLP recommended regular diet with nectar thick liquid -See full recommendation below. -SLP added to home health   History of laryngeal cancer s/p tracheostomy-stable. -Continue trach collar and tracheostomy care -Continue home glycopyrrolate for excess secretion -Consider outpatient follow-up with ENT   Syncope/shaking-likely from hypotension in the setting of dehydration and poor p.o.  intake.  Patient has history of orthostatic hypotension and takes midodrine.  Doubt seizure activity. -Increased midodrine to 10 mg 3 times daily   Centrilobular emphysema/tobacco use disorder -Encouraged tobacco cessation -Continue home inhalers and nebulizers.   Chronic anemia: H&H relatively stable.  Anemia panel consistent with anemia of chronic disease.

## 2021-11-07 ENCOUNTER — Ambulatory Visit: Payer: Medicaid Other | Attending: Family Medicine | Admitting: Physician Assistant

## 2021-11-07 ENCOUNTER — Other Ambulatory Visit: Payer: Self-pay

## 2021-11-07 ENCOUNTER — Encounter: Payer: Self-pay | Admitting: Physician Assistant

## 2021-11-07 VITALS — BP 116/81 | HR 100 | Ht 68.0 in | Wt 90.0 lb

## 2021-11-07 DIAGNOSIS — J690 Pneumonitis due to inhalation of food and vomit: Secondary | ICD-10-CM

## 2021-11-07 DIAGNOSIS — C321 Malignant neoplasm of supraglottis: Secondary | ICD-10-CM | POA: Diagnosis not present

## 2021-11-07 DIAGNOSIS — D649 Anemia, unspecified: Secondary | ICD-10-CM

## 2021-11-07 DIAGNOSIS — R918 Other nonspecific abnormal finding of lung field: Secondary | ICD-10-CM

## 2021-11-07 DIAGNOSIS — E46 Unspecified protein-calorie malnutrition: Secondary | ICD-10-CM | POA: Diagnosis not present

## 2021-11-07 DIAGNOSIS — E43 Unspecified severe protein-calorie malnutrition: Secondary | ICD-10-CM

## 2021-11-07 DIAGNOSIS — G629 Polyneuropathy, unspecified: Secondary | ICD-10-CM

## 2021-11-07 DIAGNOSIS — R636 Underweight: Secondary | ICD-10-CM

## 2021-11-07 MED ORDER — ENSURE COMPLETE PO LIQD: 237.0000 mL | Freq: Three times a day (TID) | ORAL | 3 refills | Status: AC

## 2021-11-07 MED ORDER — MISC. DEVICES MISC: 1.0000 | Freq: Every day | 0 refills | Status: AC

## 2021-11-07 NOTE — Progress Notes (Signed)
Leg and ankle pain on right side x 2 weeks

## 2021-11-07 NOTE — Progress Notes (Signed)
Patient ID: Perry Waller, male   DOB: June 29, 1961, 60 y.o.   MRN: [id number]    Perry Waller, is a 60 y.o. male  KGM:[id number]  UYQ:[id number]  DOB - April 15, 1961  No chief complaint on file.      Subjective:   Perry Waller is a 60 y.o. male here today for a follow up visit and to establish care After hospitalization with aspiration pneumonia and abnormal labs and CTA.  He is feeling ok.  Needs Rx for ensure to see if insurance will cover.  He is followed by heme onc for laryngeal CA.  He was able to get all of his meds and has PT and home health ordered.  His aid is with him today   Admit date: 10/11/2021 Discharge date: 10/14/2021 Admitted From: Home Disposition: Home Recommendations for Outpatient Follow-up:  Follow ups as below. Please obtain CBC/BMP/Mag at follow up Repeat CTA chest in about 4 to 6 weeks Please follow up on the following pending results: None  Hospital Course: 60 year old M with PMH of laryngeal cancer s/p tracheostomy, severe protein calorie malnutrition, orthostatic hypotension, emphysema and tobacco use presenting with what looks like a syncopal episode with shaking, and admitted for aspiration pneumonia.  Had poor p.o. intake leading to his presentation.  CTA chest with RUL consolidation with both areas of nodularity in RML and RLL, and emphysema.  Lactic acid elevated to 2.9.  Pro-Cal 0.16.  Started on IV Unasyn and azithromycin and admitted.   Continued on IV Unasyn and azithromycin.  Symptoms improved.  Evaluated by speech therapy who recommended regular diet with nectar thick liquid diet and aspiration precautions.   Patient is discharged on p.o. Augmentin for 5 more days and azithromycin for 2 more days.   See individual problem list below for more on hospital course.   Discharge Diagnoses:  Aspiration pneumonia-CTA chest with RUL consolidation and multiple nodularities within RML and RLL.  There is also question of circumferential right apical pleural thickening.   This is likely from aspiration pneumonia although it is difficult to rule out malignancy completely given his history.  Improved with IV Unasyn and azithromycin. -Discharged on p.o. Augmentin for 5 more days and azithromycin for 2 more days -Needs repeat CT in 4 to 6 weeks to evaluate response.   Dysphagia: Evaluated by SLP.  Had MBS.  SLP recommended regular diet with nectar thick liquid -See full recommendation below. -SLP added to home health   History of laryngeal cancer s/p tracheostomy-stable. -Continue trach collar and tracheostomy care -Continue home glycopyrrolate for excess secretion -Consider outpatient follow-up with ENT   Syncope/shaking-likely from hypotension in the setting of dehydration and poor p.o. intake.  Patient has history of orthostatic hypotension and takes midodrine.  Doubt seizure activity. -Increased midodrine to 10 mg 3 times daily   Centrilobular emphysema/tobacco use disorder -Encouraged tobacco cessation -Continue home inhalers and nebulizers.   Chronic anemia: H&H relatively stable.  Anemia panel consistent with anemia of chronic disease.                        Recent Labs    05/25/21 0340 05/26/21 0117 05/29/21 0423 06/09/21 0531 06/11/21 0031 06/12/21 0242 06/13/21 0300 10/11/21 2136 10/12/21 0329 10/13/21 0248  HGB 8.9* 8.4* 8.2* 8.7* 8.4* 8.6* 8.3* 9.1* 8.1* 8.0*  -Recheck CBC at follow-up.   Physical deconditioning/weakness-reports using walker at baseline. -HH PT/OT/wheelchair/rolling walker/right AFO   Severe malnutrition-as evidenced by low BMI and significant muscle mass and subcu  fat loss Body mass index is 14.18 kg/m. Nutrition Problem: Severe Malnutrition Etiology: chronic illness, cancer and cancer related treatments Signs/Symptoms: severe fat depletion, severe muscle depletion, percent weight loss Percent weight loss: 7.5 % Interventions: Ensure Enlive (each supplement provides 350kcal and 20 grams of protein), Magic cup,  MVI, Liberalize Diet    . Patient has No headache, No chest pain, No abdominal pain - No Nausea, No new weakness tingling or numbness, No Cough - SOB.  No problems updated.  ALLERGIES: Allergies  Allergen Reactions   Fish Allergy Other (See Comments)    Breaks out    PAST MEDICAL HISTORY: Past Medical History:  Diagnosis Date   Acute respiratory acidosis ([place]) 06/29/2019   Acute respiratory failure with hypoxia (HCC) 06/30/2019   Arthritis    Chronic anemia    baseline hgb 10-12   COPD (chronic obstructive pulmonary disease) ([place])    History of radiation therapy 08/17/19- 10/10/19   Larynx treated with 35 fractions of 2 Gy each to total 70 Gy.    Neck mass 08/05/2019    MEDICATIONS AT HOME: Prior to Admission medications   Medication Sig Start Date End Date Taking? Authorizing Provider  acetaminophen (TYLENOL) 500 MG tablet Take 500 mg by mouth every 6 (six) hours as needed for moderate pain or headache.   Yes [provider]  azithromycin (ZITHROMAX) 250 MG tablet Take 1 tablet (250 mg total) by mouth daily. 10/14/21  Yes Mercy Riding, MD  feeding supplement, ENSURE COMPLETE, (ENSURE COMPLETE) LIQD Take 237 mLs by mouth 3 (three) times daily between meals. 11/07/21 12/07/21 Yes Dasani Crear, Dionne Bucy, PA-C  gabapentin (NEURONTIN) 300 MG capsule Take 1 capsule (300 mg total) by mouth 2 (two) times daily. 09/30/21  Yes Charlott Rakes, MD  Glycopyrrolate 1 MG/5ML SOLN Take 5 mLs (1 mg total) by mouth 3 (three) times daily as needed. Patient taking differently: Take 5 mLs by mouth 3 (three) times daily as needed (stomach pain). 09/30/21  Yes Newlin, Charlane Ferretti, MD  ipratropium-albuterol (DUONEB) 0.5-2.5 (3) MG/3ML SOLN Take 3 mLs by nebulization in the morning, at noon, and at bedtime. Patient taking differently: Take 3 mLs by nebulization in the morning and at bedtime. 09/30/21  Yes Charlott Rakes, MD  magnesium hydroxide (MILK OF MAGNESIA) 400 MG/5ML suspension Take 15 mLs by mouth  daily as needed for mild constipation.   Yes [provider]  midodrine (PROAMATINE) 10 MG tablet Take 1 tablet (10 mg total) by mouth 3 (three) times daily with meals. 10/14/21  Yes Mercy Riding, MD  mirtazapine (REMERON) 15 MG tablet Take 1 tablet (15 mg total) by mouth at bedtime. 09/30/21  Yes Charlott Rakes, MD  Misc. Devices MISC Ensure complete 237 ml 90 cans/@ 3 cans per day Need for supplement, need for weight gain due to diagnosis of protein caloric malnutrition, squamous cell carcinoma of the larynx method of feeding : PO 11/04/21  Yes Charlott Rakes, MD  Misc. Devices MISC 1 each by Does not apply route daily. Camwalker for R LE 11/07/21  Yes Fina Heizer M, PA-C  Multiple Vitamin (MULTIVITAMIN WITH MINERALS) TABS tablet Take 1 tablet by mouth daily. 10/15/21  Yes Mercy Riding, MD  pantoprazole (PROTONIX) 40 MG tablet Take 1 tablet (40 mg total) by mouth daily. 10/14/21  Yes Mercy Riding, MD  polyethylene glycol powder (GLYCOLAX/MIRALAX) 17 GM/SCOOP powder Take 17 g by mouth 2 (two) times daily as needed for mild constipation. 10/14/21  Yes Mercy Riding, MD  Tiotropium Bromide-Olodaterol (STIOLTO RESPIMAT) 2.5-2.5 MCG/ACT AERS Inhale 2 puffs into the lungs daily. 10/14/21  Yes Mercy Riding, MD  metoprolol tartrate (LOPRESSOR) 25 MG tablet Take 0.5 tablets (12.5 mg total) by mouth 2 (two) times daily. For tachycardia Patient taking differently: Take 12.5 mg by mouth 2 (two) times daily. 9 am and 2 pm 09/30/21 10/30/21  Charlott Rakes, MD    ROS:  Neg cardiac  Neg GU Neg MS Neg psych Neg neuro  Objective:   Vitals:   11/07/21 1337  BP: 116/81  Pulse: 100  SpO2: 98%  Weight: 90 lb (40.8 kg)  Height: 5\' 8"  (1.727 m)   Exam General appearance : Awake, alert, not in any distress. Speech is at level of whisper and strained.  Trach in place.  Not toxic looking;  very thin, cachetic.  Appears to be in overall poor health.  In a wheelchair HEENT: Atraumatic and  Normocephalic Neck: Supple, no JVD. No cervical lymphadenopathy.  Chest: fair air entry bilaterally, CTAB.  No rales/rhonchi/wheezing CVS: S1 S2 regular, no murmurs.  Extremities: B/L Lower Ext shows no edema, both legs are warm to touch Neurology: Awake alert, and oriented X 3, CN II-XII intact, Non focal Skin: No Rash  Data Review Lab Results  Component Value Date   HGBA1C 5.7 (H) 06/29/2019    Assessment & Plan   1. Pulmonary nodules/lesions, multiple - CT Angio Chest W/Cm &/Or Wo Cm; Future - CBC with Differential/Platelet - Comprehensive metabolic panel  2. Aspiration pneumonia of right lung due to gastric secretions, unspecified part of lung (HCC) - CT Angio Chest W/Cm &/Or Wo Cm; Future - CBC with Differential/Platelet  3. Malignant neoplasm of supraglottis (HCC) Followed by heme onc - Comprehensive metabolic panel  4. Protein-calorie malnutrition, unspecified severity ([place]) - feeding supplement, ENSURE COMPLETE, (ENSURE COMPLETE) LIQD; Take 237 mLs by mouth 3 (three) times daily between meals.  Dispense: 21330 mL; Refill: 3  5. Low weight - feeding supplement, ENSURE COMPLETE, (ENSURE COMPLETE) LIQD; Take 237 mLs by mouth 3 (three) times daily between meals.  Dispense: 21330 mL; Refill: 3  6. Hypomagnesemia - Magnesium  7. Protein-calorie malnutrition, severe See #4  8. Chronic anemia - CBC with Differential/Platelet  9. Neuropathy Order new cam walker-one given at hospital is too large - Misc. Devices MISC; 1 each by Does not apply route daily. Camwalker for R LE  Dispense: 1 each; Refill: 0    Patient have been counseled extensively about nutrition and exercise. Other issues discussed during this visit include: low cholesterol diet, weight control and daily exercise, foot care, annual eye examinations at Ophthalmology, importance of adherence with medications and regular follow-up. We also discussed long term complications of uncontrolled diabetes and  hypertension.   Return in about 6 weeks (around 12/19/2021) for see PCP in 6 weeks for chronic conditions.  The patient was given clear instructions to go to ER or return to medical center if symptoms don't improve, worsen or new problems develop. The patient verbalized understanding. The patient was told to call to get lab results if they haven't heard anything in the next week.      Freeman Caldron, PA-C [facility], [place]   11/07/2021, 2:02 PM

## 2021-11-08 LAB — CBC WITH DIFFERENTIAL/PLATELET
Basophils Absolute: 0.1 10*3/uL (ref 0.0–0.2)
Basos: 1 %
EOS (ABSOLUTE): 0.2 10*3/uL (ref 0.0–0.4)
Eos: 2 %
Hematocrit: 28.6 % — ABNORMAL LOW (ref 37.5–51.0)
Hemoglobin: 8.7 g/dL — ABNORMAL LOW (ref 13.0–17.7)
Immature Grans (Abs): 0 10*3/uL (ref 0.0–0.1)
Immature Granulocytes: 0 %
Lymphocytes Absolute: 0.9 10*3/uL (ref 0.7–3.1)
Lymphs: 11 %
MCH: 24.5 pg — ABNORMAL LOW (ref 26.6–33.0)
MCHC: 30.4 g/dL — ABNORMAL LOW (ref 31.5–35.7)
MCV: 81 fL (ref 79–97)
Monocytes Absolute: 0.8 10*3/uL (ref 0.1–0.9)
Monocytes: 10 %
Neutrophils Absolute: 6.8 10*3/uL (ref 1.4–7.0)
Neutrophils: 76 %
Platelets: 550 10*3/uL — ABNORMAL HIGH (ref 150–450)
RBC: 3.55 x10E6/uL — ABNORMAL LOW (ref 4.14–5.80)
RDW: 15.3 % (ref 11.6–15.4)
WBC: 8.8 10*3/uL (ref 3.4–10.8)

## 2021-11-08 LAB — COMPREHENSIVE METABOLIC PANEL
ALT: 5 IU/L (ref 0–44)
AST: 19 IU/L (ref 0–40)
Albumin/Globulin Ratio: 0.9 — ABNORMAL LOW (ref 1.2–2.2)
Albumin: 3.3 g/dL — ABNORMAL LOW (ref 3.8–4.9)
Alkaline Phosphatase: 85 IU/L (ref 44–121)
BUN/Creatinine Ratio: 22 (ref 10–24)
BUN: 11 mg/dL (ref 8–27)
Bilirubin Total: 0.4 mg/dL (ref 0.0–1.2)
CO2: 26 mmol/L (ref 20–29)
Calcium: 9 mg/dL (ref 8.6–10.2)
Chloride: 96 mmol/L (ref 96–106)
Creatinine, Ser: 0.49 mg/dL — ABNORMAL LOW (ref 0.76–1.27)
Globulin, Total: 3.8 g/dL (ref 1.5–4.5)
Glucose: 89 mg/dL (ref 70–99)
Potassium: 4.3 mmol/L (ref 3.5–5.2)
Sodium: 138 mmol/L (ref 134–144)
Total Protein: 7.1 g/dL (ref 6.0–8.5)
eGFR: 117 mL/min/{1.73_m2} (ref >59)

## 2021-11-08 LAB — MAGNESIUM: Magnesium: 2 mg/dL (ref 1.6–2.3)

## 2021-11-13 DIAGNOSIS — Z720 Tobacco use: Secondary | ICD-10-CM | POA: Diagnosis not present

## 2021-11-13 DIAGNOSIS — D649 Anemia, unspecified: Secondary | ICD-10-CM | POA: Diagnosis not present

## 2021-11-13 DIAGNOSIS — C322 Malignant neoplasm of subglottis: Secondary | ICD-10-CM | POA: Diagnosis not present

## 2021-11-13 DIAGNOSIS — N178 Other acute kidney failure: Secondary | ICD-10-CM | POA: Diagnosis not present

## 2021-11-13 DIAGNOSIS — J398 Other specified diseases of upper respiratory tract: Secondary | ICD-10-CM | POA: Diagnosis not present

## 2021-11-13 DIAGNOSIS — J449 Chronic obstructive pulmonary disease, unspecified: Secondary | ICD-10-CM | POA: Diagnosis not present

## 2021-11-13 DIAGNOSIS — J9610 Chronic respiratory failure, unspecified whether with hypoxia or hypercapnia: Secondary | ICD-10-CM | POA: Diagnosis not present

## 2021-11-13 DIAGNOSIS — N390 Urinary tract infection, site not specified: Secondary | ICD-10-CM | POA: Diagnosis not present

## 2021-11-13 DIAGNOSIS — E860 Dehydration: Secondary | ICD-10-CM | POA: Diagnosis not present

## 2021-11-13 DIAGNOSIS — Q321 Other congenital malformations of trachea: Secondary | ICD-10-CM | POA: Diagnosis not present

## 2021-11-13 DIAGNOSIS — E43 Unspecified severe protein-calorie malnutrition: Secondary | ICD-10-CM | POA: Diagnosis not present

## 2021-11-14 ENCOUNTER — Ambulatory Visit (HOSPITAL_COMMUNITY)
Admission: RE | Admit: 2021-11-14 | Discharge: 2021-11-14 | Disposition: A | Discharge status: Home / Self Care | Payer: Medicaid Other | Source: Ambulatory Visit | Attending: Physician Assistant | Admitting: Physician Assistant

## 2021-11-14 ENCOUNTER — Other Ambulatory Visit: Payer: Self-pay | Admitting: Acute Care

## 2021-11-14 ENCOUNTER — Encounter (HOSPITAL_COMMUNITY): Payer: Self-pay

## 2021-11-14 ENCOUNTER — Other Ambulatory Visit: Payer: Self-pay

## 2021-11-14 ENCOUNTER — Ambulatory Visit (HOSPITAL_BASED_OUTPATIENT_CLINIC_OR_DEPARTMENT_OTHER)
Admission: RE | Admit: 2021-11-14 | Discharge: 2021-11-14 | Disposition: A | Discharge status: Home / Self Care | Payer: Medicaid Other | Source: Ambulatory Visit

## 2021-11-14 DIAGNOSIS — J690 Pneumonitis due to inhalation of food and vomit: Secondary | ICD-10-CM

## 2021-11-14 DIAGNOSIS — C760 Malignant neoplasm of head, face and neck: Secondary | ICD-10-CM

## 2021-11-14 DIAGNOSIS — Z930 Tracheostomy status: Secondary | ICD-10-CM

## 2021-11-14 DIAGNOSIS — R918 Other nonspecific abnormal finding of lung field: Secondary | ICD-10-CM | POA: Insufficient documentation

## 2021-11-14 MED ORDER — AMOXICILLIN-POT CLAVULANATE 875-125 MG PO TABS: 1.0000 | ORAL_TABLET | Freq: Two times a day (BID) | ORAL | 0 refills | Status: AC

## 2021-11-14 MED ORDER — IOHEXOL 350 MG/ML SOLN
60.0000 mL | Freq: Once | INTRAVENOUS | Status: AC | PRN
  Administered 2021-11-14: 17:00:00 60 mL via INTRAVENOUS

## 2021-11-14 NOTE — Progress Notes (Signed)
Tracheostomy Procedure Note  Perry Waller [id number] [birth year]  Pre Procedure Tracheostomy Information  Trach Brand: Shiley Size:  4.0  flex  Style: Uncuffed Secured by: Velcro   Procedure:Trach change and Trach Cleaning    Post Procedure Tracheostomy Information  Trach Brand: Shiley Size:  4.0 flex Style: Uncuffed Secured by: Velcro   Post Procedure Evaluation:  ETCO2 positive color change from yellow to purple : Yes.   Vital signs:VSS Patients current condition: stable Complications: No apparent complications Trach site exam: clean, dry Wound care done: 4 x 4 gauze drain Patient did tolerate procedure well.   Education: none  Prescription needs: none    Additional needs: none

## 2021-11-14 NOTE — Progress Notes (Signed)
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 Recently discharged from hospital about 1 mo ago after being treated for aspiration PNA. On CT imaging he had dense RUL consolidation and loculated pleural fluid in RUL. He completed what looks like 10 d total course therapy. He had been doing ok until about 3 d ago when he began to notice worsening cough (this was thick, yellow/creamy sputum) fatigue, and increased SOB. He could not tell me if had fevers but overall he felt poor. He had presented today planned trach change in addition to the new complaints above  Review of Systems  Constitutional:  Positive for chills and malaise/fatigue.  HENT:  Positive for congestion and sore throat.   Eyes: Negative.   Respiratory:  Positive for cough, sputum production and shortness of breath.   Cardiovascular: Negative.   Gastrointestinal: Negative.   Genitourinary: Negative.   Musculoskeletal: Negative.   Skin: Negative.   Neurological: Negative.   Endo/Heme/Allergies: Negative.   Psychiatric/Behavioral: Negative.     Exam  Pulse ox mid 90s General frail 60 year old male. Sitting up in wheel chair. He is in no distress.  HENT NCAT does have temporal wasting Trach size 4 w/ thick secretions as noted Pulm coarse scattered rhonchi. + tactile frem Righ > left  Card rrr Abd soft  Ext warm and dry  Neuro intact   Procedure  The current trach was removed. Site inspected. It was unremarkable. New trach inserted over obturator. ETCO2 + pt tolerated well  Impression/plan H/o head and neck cancer Trach dependence  Aspiration PNA w/ concern for evolving cavitary PNA Severe protein calorie malnutrition   Discussion Frail I think he got worse after stopping abx. Prob needs longer course. Also can't exclude underlying malignancy hiding in the dense consolidative airspace disease.  Luckily he does have CT chest ordered  for follow up today at [facility] long and he is headed there today   Plan I have given him a script for 10 days of augmentin He will follow up in pulmonary on 22nd at 2 pm (I called his sister to let her know)  F/u with me in 12 weeks for trach change   My time 21 min  [name] ACNP-BC [place] Pager # [phone] OR # [phone] if no answer

## 2021-11-18 ENCOUNTER — Other Ambulatory Visit: Payer: Self-pay | Admitting: Physician Assistant

## 2021-11-18 DIAGNOSIS — R9389 Abnormal findings on diagnostic imaging of other specified body structures: Secondary | ICD-10-CM

## 2021-11-18 DIAGNOSIS — Z515 Encounter for palliative care: Secondary | ICD-10-CM

## 2021-11-18 NOTE — Progress Notes (Signed)
@Patient  ID: Perry Waller, male    DOB: 20-Feb-1961, 60 y.o.   MRN: [id number]  Chief Complaint  Patient presents with   Follow-up   Hospitalization Follow-up    Referring provider: Charlott Rakes, MD  HPI: 60 year old male, current everyday smoker. PMH significant for COPD, centrilobular emphysema, aspiration pneumonia, malignant neoplasm supraglottis, tracheal stenosis following tracheostomy. Patient of Dr. Loanne Drilling, last see on 10/12/21.   Previous [facility] pulmonary encounter: 10/12/21- Dr. Loanne Drilling  [name] is a 60 year old male with head and neck cancer s/p chemoradiation s/p trach and PEG s/p removal who presents for follow-up.  Since our last visit, he reports his appetite has returned however has episodes of coughing that occur several minutes after eating and drinking. He does not cough with the act of eating but it seems to occur several minutes afterwards. Denies chest burning, abdominal pain, reflux or sour taste in his mouth, nausea or emesis. Does not occur when laying down. He is drinking more Ensure and attempting to get his weight up.   He did not pick up his Stioloto until yesterday. He has been using his albuterol 3-4 times at minimum daily. He received the prescription for his Duonebs however realized he did not have a nebulizer. He did not call the office as he planned to wait until this visit to inform me. He feels the Stiolto is making a tremendous difference. He has also cut down to 2-3 cigarettes daily from 1/2 ppd.  Social History: Active smoker. Previously 2ppd x 40 years. Currently 2-3 cigarettes daily.  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 Recently discharged from hospital about 1 mo ago after being treated for aspiration PNA. On CT imaging he had dense RUL consolidation and loculated pleural fluid  in RUL. He completed what looks like 10 d total course therapy. He had been doing ok until about 3 d ago when he began to notice worsening cough (this was thick, yellow/creamy sputum) fatigue, and increased SOB. He could not tell me if had fevers but overall he felt poor. He had presented today planned trach change in addition to the new complaints above  Impression/plan H/o head and neck cancer Trach dependence  Aspiration PNA w/ concern for evolving cavitary PNA Severe protein calorie malnutrition    Discussion Frail I think he got worse after stopping abx. Prob needs longer course. Also can't exclude underlying malignancy hiding in the dense consolidative airspace disease.  Luckily he does have CT chest ordered for follow up today at [facility] and he is headed there today    Plan I have given him a script for 10 days of augmentin He will follow up in pulmonary on 22nd at 2 pm (I called his sister to let her know)  F/u with me in 12 weeks for trach change    11/19/2021- Interim hx  Patient presents today for hospital follow-up. He was admitted for three days in October 2022 for aspiration pneumonia. Treated  He follows with Perry Waller in tracheostomy clinic, last seen on 11/14/21 for trach change and cleaning. Sent in Rx Augmentin x 10 days during that visit. He continues to have a productive cough with thick, clear mucus. He is not currently taking anything OTC for cough. He is compliant with nebulizer three times a day as scheduled. He has lost  weight recently. He is not able to eat meals. Drinks 3-4 ensure a day. He had CTA imaging on 11/14/21 that showed concern for metastic disease. Biapical cavitary lesions are again demonstrated with extensive necrotic appearing tumor on the right side measuring a maximum 4 cm. He has an apt with oncology on 12/14. Denies nausea or vomiting. He is still smoking 15-20 cigarettes a day, he is not ready to quit.   Trach BrandCristy Hilts Size:  4.0  flex   Style: Uncuffed Secured by: Velcro  Allergies  Allergen Reactions   Fish Allergy Other (See Comments)    Breaks out    Immunization History  Administered Date(s) Administered   PFIZER(Purple Top)SARS-COV-2 Vaccination 05/18/2020, 06/08/2020   Pneumococcal Polysaccharide-23 08/07/2019    Past Medical History:  Diagnosis Date   Acute respiratory acidosis ([place]) 06/29/2019   Acute respiratory failure with hypoxia ([facility]) 06/30/2019   Arthritis    Chronic anemia    baseline hgb 10-12   COPD (chronic obstructive pulmonary disease) ([place])    History of radiation therapy 08/17/19- 10/10/19   Larynx treated with 35 fractions of 2 Gy each to total 70 Gy.    Neck mass 08/05/2019    Tobacco History: Social History   Tobacco Use  Smoking Status Every Day   Packs/day: 0.50   Years: 45.00   Pack years: 22.50   Types: Cigarettes  Smokeless Tobacco Never  Tobacco Comments   he reports smoking 7-8 cigarettes daily   Ready to quit: Not Answered Counseling given: Not Answered Tobacco comments: he reports smoking 7-8 cigarettes daily   Outpatient Medications Prior to Visit  Medication Sig Dispense Refill   acetaminophen (TYLENOL) 500 MG tablet Take 500 mg by mouth every 6 (six) hours as needed for moderate pain or headache.     amoxicillin-clavulanate (AUGMENTIN) 875-125 MG tablet Take 1 tablet by mouth 2 (two) times daily for 10 days. 20 tablet 0   azithromycin (ZITHROMAX) 250 MG tablet Take 1 tablet (250 mg total) by mouth daily. 2 each 0   feeding supplement, ENSURE COMPLETE, (ENSURE COMPLETE) LIQD Take 237 mLs by mouth 3 (three) times daily between meals. 21330 mL 3   gabapentin (NEURONTIN) 300 MG capsule Take 1 capsule (300 mg total) by mouth 2 (two) times daily. 60 capsule 3   Glycopyrrolate 1 MG/5ML SOLN Take 5 mLs (1 mg total) by mouth 3 (three) times daily as needed. (Patient taking differently: Take 5 mLs by mouth 3 (three) times daily as needed (stomach pain).) 473 mL 1    ipratropium-albuterol (DUONEB) 0.5-2.5 (3) MG/3ML SOLN Take 3 mLs by nebulization in the morning, at noon, and at bedtime. (Patient taking differently: Take 3 mLs by nebulization in the morning and at bedtime.) 360 mL 6   magnesium hydroxide (MILK OF MAGNESIA) 400 MG/5ML suspension Take 15 mLs by mouth daily as needed for mild constipation.     midodrine (PROAMATINE) 10 MG tablet Take 1 tablet (10 mg total) by mouth 3 (three) times daily with meals. 270 tablet 1   mirtazapine (REMERON) 15 MG tablet Take 1 tablet (15 mg total) by mouth at bedtime. 30 tablet 3   Misc. Devices MISC Ensure complete 237 ml 90 cans/@ 3 cans per day Need for supplement, need for weight gain due to diagnosis of protein caloric malnutrition, squamous cell carcinoma of the larynx method of feeding : PO 90 each 5   Misc. Devices MISC 1 each by Does not apply route daily. Camwalker for R LE 1  each 0   Multiple Vitamin (MULTIVITAMIN WITH MINERALS) TABS tablet Take 1 tablet by mouth daily.     pantoprazole (PROTONIX) 40 MG tablet Take 1 tablet (40 mg total) by mouth daily. 90 tablet 0   polyethylene glycol powder (GLYCOLAX/MIRALAX) 17 GM/SCOOP powder Take 17 g by mouth 2 (two) times daily as needed for mild constipation. 255 g 1   Tiotropium Bromide-Olodaterol (STIOLTO RESPIMAT) 2.5-2.5 MCG/ACT AERS Inhale 2 puffs into the lungs daily. 4 g 6   metoprolol tartrate (LOPRESSOR) 25 MG tablet Take 0.5 tablets (12.5 mg total) by mouth 2 (two) times daily. For tachycardia (Patient taking differently: Take 12.5 mg by mouth 2 (two) times daily. 9 am and 2 pm) 30 tablet 6   No facility-administered medications prior to visit.    Review of Systems  Review of Systems  Constitutional:  Positive for unexpected weight change.  HENT:  Positive for congestion.   Respiratory:  Positive for cough. Negative for chest tightness, shortness of breath and wheezing.   Cardiovascular: Negative.     Physical Exam  BP 106/70 (BP Location: Right  Arm, Patient Position: Sitting, Cuff Size: Normal)   Pulse (!) 107   Temp 97.8 F (36.6 C)   Ht 5\' 2"  (1.575 m)   Wt 84 lb 9.6 oz (38.4 kg)   SpO2 96%   BMI 15.47 kg/m  Physical Exam Constitutional:      Appearance: Normal appearance.     Comments: Chronically ill appearing, no acute distress  HENT:     Mouth/Throat:     Comments: Deferred d/t masking Cardiovascular:     Rate and Rhythm: Normal rate and regular rhythm.  Pulmonary:     Effort: Pulmonary effort is normal.     Breath sounds: Normal breath sounds. No wheezing, rhonchi or rales.     Comments: No overt wheezing or rhonchi Musculoskeletal:     Comments: In WC  Skin:    General: Skin is warm and dry.  Neurological:     General: No focal deficit present.     Mental Status: He is alert and oriented to person, place, and time. Mental status is at baseline.  Psychiatric:        Mood and Affect: Mood normal.        Behavior: Behavior normal.        Thought Content: Thought content normal.        Judgment: Judgment normal.     Lab Results:  CBC    Component Value Date/Time   WBC 8.8 11/07/2021 1427   WBC 8.3 10/14/2021 0148   RBC 3.55 (L) 11/07/2021 1427   RBC 3.04 (L) 10/14/2021 0148   HGB 8.7 (L) 11/07/2021 1427   HCT 28.6 (L) 11/07/2021 1427   PLT 550 (H) 11/07/2021 1427   MCV 81 11/07/2021 1427   MCH 24.5 (L) 11/07/2021 1427   MCH 25.3 (L) 10/14/2021 0148   MCHC 30.4 (L) 11/07/2021 1427   MCHC 30.0 10/14/2021 0148   RDW 15.3 11/07/2021 1427   LYMPHSABS 0.9 11/07/2021 1427   MONOABS 0.6 10/12/2021 0329   EOSABS 0.2 11/07/2021 1427   BASOSABS 0.1 11/07/2021 1427    BMET    Component Value Date/Time   NA 138 11/07/2021 1427   K 4.3 11/07/2021 1427   CL 96 11/07/2021 1427   CO2 26 11/07/2021 1427   GLUCOSE 89 11/07/2021 1427   GLUCOSE 118 (H) 10/14/2021 0148   BUN 11 11/07/2021 1427   CREATININE 0.49 (L) 11/07/2021  1427   CREATININE 0.79 06/01/2020 1148   CALCIUM 9.0 11/07/2021 1427    GFRNONAA >60 10/14/2021 0148   GFRNONAA >60 06/01/2020 1148   GFRAA 126 11/20/2020 1426   GFRAA >60 06/01/2020 1148    BNP No results found for: BNP  ProBNP No results found for: PROBNP  Imaging: CT Angio Chest W/Cm &/Or Wo Cm  Result Date: 11/14/2021 CLINICAL DATA:  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 EXAM: CT ANGIOGRAPHY CHEST WITH CONTRAST TECHNIQUE: Multidetector CT imaging of the chest was performed using the standard protocol during bolus administration of intravenous contrast. Multiplanar CT image reconstructions and MIPs were obtained to evaluate the vascular anatomy. CONTRAST:  44mL OMNIPAQUE IOHEXOL 350 MG/ML SOLN COMPARISON:  Chest CT 10/12/2021 FINDINGS: Cardiovascular: The heart is within normal limits in size. No pericardial effusion. There is mild tortuosity of the thoracic aorta but no focal aneurysm or dissection. Scattered atherosclerotic calcifications. Scattered coronary artery calcifications. Suboptimal opacification of the pulmonary arteries. No obvious pulmonary emboli. Mediastinum/Nodes: Extensive mediastinal and right hilar lymphadenopathy. Right paratracheal node on image 30/4 measures 2.3 cm 17 mm precarinal lymph node on image 36/4. 2 cm AP window node on image 38/4 2.2 cm suprahilar node on image 39/4 2.4 cm subcarinal nodal mass on image 47/4 The tracheostomy tube is in good position. The esophagus is grossly normal. Lungs/Pleura: Biapical cavitary lesions are again demonstrated with extensive necrotic appearing tumor on the right side measuring a maximum 4 cm. Small rim of tumor on the left side. Extensive metastatic disease involving the lungs more notably on the right side with probable endobronchial spread of tumor throughout the right lung along with probable hematogenous Mets also. Small right pleural effusion. Upper Abdomen: Bizarre enhancement pattern in the liver could be due to portal vein thrombosis or diffuse  metastatic disease. No obvious adrenal gland lesions or renal lesions. Musculoskeletal: The destructive bony changes involving the right third posterior rib suggesting metastatic disease I do not see any other obvious bone lesions. Review of the MIP images confirms the above findings. IMPRESSION: 1. Suboptimal opacification of the pulmonary arteries but no obvious pulmonary emboli. 2. Extensive metastatic disease involving the chest as detailed above. 3. Destructive bony changes involving the right third posterior rib suggesting metastatic disease. 4. Bizarre enhancement pattern in the liver could be due to portal vein thrombosis or diffuse metastatic disease. 5. Aortic atherosclerosis. Aortic Atherosclerosis (ICD10-I70.0). Electronically Signed   By: Marijo Sanes M.D.   On: 11/14/2021 18:26     Assessment & Plan:   Aspiration pneumonia ([place]) - Hx malignant neoplasm of supraglottis and tracheal stenosis s/p tracheostomy. He was admitted for three days in October for aspiration pneumonia. He was seen by our trach clinic on 11/14/21 and sent in additional RX Augmentin 1 tab BID x 10 days. He continues to have weight loss and productive cough with thick clear mucus. He underwent CTA on 11/14/21 that showed findings concerning for metastatic disease involving the chest. Biapical cavitary lesions are again demonstrated with extensive necrotic appearing tumor on the right side measuring a maximum 4 cm. He has an apt with Dr/ Julien Nordmann with Oncology on December 14th, his sister will see if they can move this apt up. Recommend he start Robitussin-DM 27ml every 4 hours (RX send to pharmacy). FU in 1 month with Dr. Loanne Drilling or sooner if needed.   COPD (chronic obstructive pulmonary disease) ([facility]) - Resume Sitolto respimat two puff once daily in the morning - Continue ipratropium-albuterol  nebulizer every 8 hours for sob/wheezing  Tracheal stenosis following tracheostomy [facility]) - Following with our trach clinic, last  seen on 11/14/21 for trach change/cleaning  Trach Brand: Shiley Size:  4.0  flex  Style: Uncuffed Secured by: Velcro  40 mins spent on case: >50% face to face  Martyn Ehrich, NP 11/19/2021

## 2021-11-19 ENCOUNTER — Telehealth: Payer: Self-pay | Admitting: Primary Care

## 2021-11-19 ENCOUNTER — Encounter: Payer: Self-pay | Admitting: Primary Care

## 2021-11-19 ENCOUNTER — Telehealth: Payer: Self-pay | Admitting: Internal Medicine

## 2021-11-19 ENCOUNTER — Ambulatory Visit (INDEPENDENT_AMBULATORY_CARE_PROVIDER_SITE_OTHER): Payer: Medicaid Other | Admitting: Primary Care

## 2021-11-19 ENCOUNTER — Other Ambulatory Visit: Payer: Self-pay

## 2021-11-19 DIAGNOSIS — J449 Chronic obstructive pulmonary disease, unspecified: Secondary | ICD-10-CM | POA: Diagnosis not present

## 2021-11-19 DIAGNOSIS — J9503 Malfunction of tracheostomy stoma: Secondary | ICD-10-CM | POA: Diagnosis not present

## 2021-11-19 DIAGNOSIS — J690 Pneumonitis due to inhalation of food and vomit: Secondary | ICD-10-CM | POA: Diagnosis not present

## 2021-11-19 DIAGNOSIS — J432 Centrilobular emphysema: Secondary | ICD-10-CM

## 2021-11-19 MED ORDER — STIOLTO RESPIMAT 2.5-2.5 MCG/ACT IN AERS: 2.0000 | INHALATION_SPRAY | Freq: Every day | RESPIRATORY_TRACT | 6 refills | Status: AC

## 2021-11-19 MED ORDER — GUAIFENESIN-DM 100-10 MG/5ML PO SYRP: 5.0000 mL | ORAL_SOLUTION | ORAL | 1 refills | Status: AC | PRN

## 2021-11-19 NOTE — Assessment & Plan Note (Addendum)
-   Hx malignant neoplasm of supraglottis and tracheal stenosis s/p tracheostomy. He was admitted for three days in October for aspiration pneumonia. He was seen by our trach clinic on 11/14/21 and sent in additional RX Augmentin 1 tab BID x 10 days. He continues to have weight loss and productive cough with thick clear mucus. He underwent CTA on 11/14/21 that showed findings concerning for metastatic disease involving the chest. Biapical cavitary lesions are again demonstrated with extensive necrotic appearing tumor on the right side measuring a maximum 4 cm. He has an apt with Dr/ Julien Nordmann with Oncology on December 14th, his sister will see if they can move this apt up. Recommend he start Robitussin-DM 50ml every 4 hours (RX send to pharmacy). FU in 1 month with Dr. Loanne Drilling or sooner if needed.

## 2021-11-19 NOTE — Telephone Encounter (Signed)
Scheduled appt per 11/21 referral. Spoke to pt's sister who is aware of appt date and time. I did offer her an appt on 12/7, however she said she is his ride and she is working that day so she would be unable to bring him. She also said it would not be possible for him to do morning appts. I scheduled him on 12/14 per her request, she is off that day and able to bring him.

## 2021-11-19 NOTE — Patient Instructions (Addendum)
Recommendations: Take Robitussin-DM 46ml every 4 hours (RX send to pharmacy) Continue Augmentin course until complete Resume Stiolto two puffs daily in the morning  Continue Nebulizer three times a day  Call Dr. Mohamed/oncology to see if you can get sooner apt than December 14th   Follow-up: 1 month with Dr. Loanne Drilling or sooner

## 2021-11-19 NOTE — Assessment & Plan Note (Signed)
-   Following with our trach clinic, last seen on 11/14/21 for trach change/cleaning  Trach Brand: Shiley Size:  4.0  flex  Style: Uncuffed Secured by: Velcro

## 2021-11-19 NOTE — Assessment & Plan Note (Signed)
-   Resume Sitolto respimat two puff once daily in the morning - Continue ipratropium-albuterol nebulizer every 8 hours for sob/wheezing

## 2021-11-20 ENCOUNTER — Telehealth: Payer: Self-pay | Admitting: Internal Medicine

## 2021-11-20 NOTE — Telephone Encounter (Signed)
Pt's sister called requesting to move pt's appt up to earlier date. I did let her know the appt I had offered her yesterday and she had refused was no longer available. I moved him appt to next available which was 12/8 at 11:30. She is aware of new appt date and time.

## 2021-12-04 ENCOUNTER — Other Ambulatory Visit: Payer: Self-pay | Admitting: *Deleted

## 2021-12-04 DIAGNOSIS — C321 Malignant neoplasm of supraglottis: Secondary | ICD-10-CM

## 2021-12-05 ENCOUNTER — Encounter: Payer: Self-pay | Admitting: Internal Medicine

## 2021-12-05 ENCOUNTER — Inpatient Hospital Stay: Payer: Medicaid Other | Attending: Internal Medicine | Admitting: Internal Medicine

## 2021-12-05 ENCOUNTER — Other Ambulatory Visit: Payer: Self-pay

## 2021-12-05 ENCOUNTER — Inpatient Hospital Stay: Payer: Medicaid Other

## 2021-12-05 VITALS — BP 97/73 | HR 115 | Temp 98.0°F | Resp 17 | Ht 62.0 in | Wt 86.5 lb

## 2021-12-05 DIAGNOSIS — C787 Secondary malignant neoplasm of liver and intrahepatic bile duct: Secondary | ICD-10-CM | POA: Insufficient documentation

## 2021-12-05 DIAGNOSIS — Z833 Family history of diabetes mellitus: Secondary | ICD-10-CM | POA: Insufficient documentation

## 2021-12-05 DIAGNOSIS — M898X9 Other specified disorders of bone, unspecified site: Secondary | ICD-10-CM | POA: Diagnosis not present

## 2021-12-05 DIAGNOSIS — R490 Dysphonia: Secondary | ICD-10-CM | POA: Diagnosis not present

## 2021-12-05 DIAGNOSIS — M4802 Spinal stenosis, cervical region: Secondary | ICD-10-CM | POA: Insufficient documentation

## 2021-12-05 DIAGNOSIS — J432 Centrilobular emphysema: Secondary | ICD-10-CM | POA: Insufficient documentation

## 2021-12-05 DIAGNOSIS — J449 Chronic obstructive pulmonary disease, unspecified: Secondary | ICD-10-CM | POA: Diagnosis not present

## 2021-12-05 DIAGNOSIS — Z79899 Other long term (current) drug therapy: Secondary | ICD-10-CM | POA: Insufficient documentation

## 2021-12-05 DIAGNOSIS — C7801 Secondary malignant neoplasm of right lung: Secondary | ICD-10-CM | POA: Insufficient documentation

## 2021-12-05 DIAGNOSIS — C7951 Secondary malignant neoplasm of bone: Secondary | ICD-10-CM | POA: Diagnosis not present

## 2021-12-05 DIAGNOSIS — R059 Cough, unspecified: Secondary | ICD-10-CM

## 2021-12-05 DIAGNOSIS — I700 Atherosclerosis of aorta: Secondary | ICD-10-CM | POA: Insufficient documentation

## 2021-12-05 DIAGNOSIS — K7689 Other specified diseases of liver: Secondary | ICD-10-CM

## 2021-12-05 DIAGNOSIS — R0609 Other forms of dyspnea: Secondary | ICD-10-CM | POA: Insufficient documentation

## 2021-12-05 DIAGNOSIS — R630 Anorexia: Secondary | ICD-10-CM | POA: Diagnosis not present

## 2021-12-05 DIAGNOSIS — Z808 Family history of malignant neoplasm of other organs or systems: Secondary | ICD-10-CM | POA: Insufficient documentation

## 2021-12-05 DIAGNOSIS — F17210 Nicotine dependence, cigarettes, uncomplicated: Secondary | ICD-10-CM | POA: Insufficient documentation

## 2021-12-05 DIAGNOSIS — R5383 Other fatigue: Secondary | ICD-10-CM | POA: Diagnosis not present

## 2021-12-05 DIAGNOSIS — Z801 Family history of malignant neoplasm of trachea, bronchus and lung: Secondary | ICD-10-CM | POA: Diagnosis not present

## 2021-12-05 DIAGNOSIS — C7802 Secondary malignant neoplasm of left lung: Secondary | ICD-10-CM | POA: Diagnosis not present

## 2021-12-05 DIAGNOSIS — M47812 Spondylosis without myelopathy or radiculopathy, cervical region: Secondary | ICD-10-CM | POA: Insufficient documentation

## 2021-12-05 DIAGNOSIS — R634 Abnormal weight loss: Secondary | ICD-10-CM | POA: Insufficient documentation

## 2021-12-05 DIAGNOSIS — R590 Localized enlarged lymph nodes: Secondary | ICD-10-CM | POA: Diagnosis not present

## 2021-12-05 DIAGNOSIS — C321 Malignant neoplasm of supraglottis: Secondary | ICD-10-CM | POA: Insufficient documentation

## 2021-12-05 DIAGNOSIS — Z809 Family history of malignant neoplasm, unspecified: Secondary | ICD-10-CM

## 2021-12-05 DIAGNOSIS — Z515 Encounter for palliative care: Secondary | ICD-10-CM | POA: Insufficient documentation

## 2021-12-05 DIAGNOSIS — J329 Chronic sinusitis, unspecified: Secondary | ICD-10-CM | POA: Insufficient documentation

## 2021-12-05 DIAGNOSIS — C3490 Malignant neoplasm of unspecified part of unspecified bronchus or lung: Secondary | ICD-10-CM

## 2021-12-05 DIAGNOSIS — Z823 Family history of stroke: Secondary | ICD-10-CM | POA: Diagnosis not present

## 2021-12-05 DIAGNOSIS — M6281 Muscle weakness (generalized): Secondary | ICD-10-CM | POA: Diagnosis not present

## 2021-12-05 DIAGNOSIS — R0600 Dyspnea, unspecified: Secondary | ICD-10-CM

## 2021-12-05 DIAGNOSIS — J90 Pleural effusion, not elsewhere classified: Secondary | ICD-10-CM | POA: Insufficient documentation

## 2021-12-05 DIAGNOSIS — R911 Solitary pulmonary nodule: Secondary | ICD-10-CM

## 2021-12-05 DIAGNOSIS — Z923 Personal history of irradiation: Secondary | ICD-10-CM | POA: Insufficient documentation

## 2021-12-05 LAB — CBC WITH DIFFERENTIAL (CANCER CENTER ONLY)
Abs Immature Granulocytes: 0.03 K/uL (ref 0.00–0.07)
Basophils Absolute: 0.1 K/uL (ref 0.0–0.1)
Basophils Relative: 1 %
Eosinophils Absolute: 0.2 K/uL (ref 0.0–0.5)
Eosinophils Relative: 2 %
HCT: 29.8 % — ABNORMAL LOW (ref 39.0–52.0)
Hemoglobin: 8.7 g/dL — ABNORMAL LOW (ref 13.0–17.0)
Immature Granulocytes: 0 %
Lymphocytes Relative: 14 %
Lymphs Abs: 1.2 K/uL (ref 0.7–4.0)
MCH: 24.3 pg — ABNORMAL LOW (ref 26.0–34.0)
MCHC: 29.2 g/dL — ABNORMAL LOW (ref 30.0–36.0)
MCV: 83.2 fL (ref 80.0–100.0)
Monocytes Absolute: 0.6 K/uL (ref 0.1–1.0)
Monocytes Relative: 7 %
Neutro Abs: 6.4 K/uL (ref 1.7–7.7)
Neutrophils Relative %: 76 %
Platelet Count: 463 K/uL — ABNORMAL HIGH (ref 150–400)
RBC: 3.58 MIL/uL — ABNORMAL LOW (ref 4.22–5.81)
RDW: 15.9 % — ABNORMAL HIGH (ref 11.5–15.5)
WBC Count: 8.5 K/uL (ref 4.0–10.5)
nRBC: 0 % (ref 0.0–0.2)

## 2021-12-05 LAB — CMP (CANCER CENTER ONLY)
ALT: <5 U/L (ref 0–44)
AST: 11 U/L — ABNORMAL LOW (ref 15–41)
Albumin: 2.4 g/dL — ABNORMAL LOW (ref 3.5–5.0)
Alkaline Phosphatase: 82 U/L (ref 38–126)
Anion gap: 9 (ref 5–15)
BUN: 12 mg/dL (ref 6–20)
CO2: 28 mmol/L (ref 22–32)
Calcium: 8.8 mg/dL — ABNORMAL LOW (ref 8.9–10.3)
Chloride: 103 mmol/L (ref 98–111)
Creatinine: 0.64 mg/dL (ref 0.61–1.24)
GFR, Estimated: >60 mL/min
Glucose, Bld: 103 mg/dL — ABNORMAL HIGH (ref 70–99)
Potassium: 3.7 mmol/L (ref 3.5–5.1)
Sodium: 140 mmol/L (ref 135–145)
Total Bilirubin: 0.5 mg/dL (ref 0.3–1.2)
Total Protein: 7.7 g/dL (ref 6.5–8.1)
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
## 2021-12-06 ENCOUNTER — Telehealth: Payer: Self-pay | Admitting: Internal Medicine

## 2021-12-06 NOTE — Telephone Encounter (Signed)
Sch per 12/7 los, left msg .

## 2021-12-10 ENCOUNTER — Telehealth: Payer: Self-pay | Admitting: Family Medicine

## 2021-12-10 NOTE — Telephone Encounter (Signed)
Copied from [place] [id number]. Topic: General - Inquiry >> Dec 09, 2021 12:18 PM Loma Boston wrote: Pls reach out to [facility]) as she states that they have not received orders from 10/19 and no return calls, pls verify looks like orders from [place] not [facility]ff wants FU [phone]

## 2021-12-11 ENCOUNTER — Other Ambulatory Visit: Payer: Self-pay

## 2021-12-11 ENCOUNTER — Encounter: Payer: Self-pay | Admitting: Pulmonary Disease

## 2021-12-11 ENCOUNTER — Other Ambulatory Visit: Payer: Medicaid Other

## 2021-12-11 ENCOUNTER — Ambulatory Visit: Payer: Medicaid Other | Admitting: Internal Medicine

## 2021-12-11 ENCOUNTER — Ambulatory Visit (INDEPENDENT_AMBULATORY_CARE_PROVIDER_SITE_OTHER): Payer: Medicaid Other | Admitting: Pulmonary Disease

## 2021-12-11 ENCOUNTER — Telehealth: Payer: Self-pay | Admitting: Pulmonary Disease

## 2021-12-11 VITALS — BP 116/70 | HR 78 | Ht 66.0 in | Wt 83.2 lb

## 2021-12-11 DIAGNOSIS — J439 Emphysema, unspecified: Secondary | ICD-10-CM | POA: Diagnosis not present

## 2021-12-11 DIAGNOSIS — R918 Other nonspecific abnormal finding of lung field: Secondary | ICD-10-CM | POA: Diagnosis not present

## 2021-12-11 NOTE — Telephone Encounter (Signed)
I scheduled pt for 12/19 at 10:00 at [place].  Pt will go for covid test on 12/16.  Gave appt info to pt's sister Blain Pais.

## 2021-12-11 NOTE — Telephone Encounter (Signed)
Thanks for the update

## 2021-12-11 NOTE — H&P (View-Only) (Signed)
b  @Patient  ID: Perry Waller, male    DOB: [birth year], 60 y.o.   MRN: [id number]  Chief Complaint  Patient presents with   Follow-up    Patient states with little exertion he is short of breath. Wants to talk about a portable suction machine.     Referring provider: Charlott Rakes, MD  HPI: 60 year old male, current everyday smoker. PMH significant for COPD, centrilobular emphysema, aspiration pneumonia, malignant neoplasm supraglottis, tracheal stenosis following tracheostomy. Presents for follow-up.  Previous [facility] pulmonary encounter: 10/12/21- Dr. Loanne Drilling  [name] is a 60 year old male with head and neck cancer s/p chemoradiation s/p trach and PEG s/p removal who presents for follow-up.  Since our last visit, he reports his appetite has returned however has episodes of coughing that occur several minutes after eating and drinking. He does not cough with the act of eating but it seems to occur several minutes afterwards. Denies chest burning, abdominal pain, reflux or sour taste in his mouth, nausea or emesis. Does not occur when laying down. He is drinking more Ensure and attempting to get his weight up.   He did not pick up his Stioloto until yesterday. He has been using his albuterol 3-4 times at minimum daily. He received the prescription for his Duonebs however realized he did not have a nebulizer. He did not call the office as he planned to wait until this visit to inform me. He feels the Stiolto is making a tremendous difference. He has also cut down to 2-3 cigarettes daily from 1/2 ppd.  Social History: Active smoker. Previously 2ppd x 40 years. Currently 2-3 cigarettes daily.  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 Recently discharged from hospital about 1 mo ago after being treated for aspiration PNA. On CT imaging  he had dense RUL consolidation and loculated pleural fluid in RUL. He completed what looks like 10 d total course therapy. He had been doing ok until about 3 d ago when he began to notice worsening cough (this was thick, yellow/creamy sputum) fatigue, and increased SOB. He could not tell me if had fevers but overall he felt poor. He had presented today planned trach change in addition to the new complaints above  Impression/plan H/o head and neck cancer Trach dependence  Aspiration PNA w/ concern for evolving cavitary PNA Severe protein calorie malnutrition    Discussion Frail I think he got worse after stopping abx. Prob needs longer course. Also can't exclude underlying malignancy hiding in the dense consolidative airspace disease.  Luckily he does have CT chest ordered for follow up today at [facility] long and he is headed there today    Plan I have given him a script for 10 days of augmentin He will follow up in pulmonary on 22nd at 2 pm (I called his sister to let her know)  F/u with me in 12 weeks for trach change   11/19/21 - NP [name] Patient presents today for hospital follow-up. He was admitted for three days in October 2022 for aspiration pneumonia. Treated  He follows with Marni Griffon in tracheostomy clinic, last seen on 11/14/21 for trach change and cleaning. Sent in Rx Augmentin x 10 days during that visit. He continues to have a productive cough with thick, clear mucus. He is not currently taking anything OTC for cough. He is  compliant with nebulizer three times a day as scheduled. He has lost weight recently. He is not able to eat meals. Drinks 3-4 ensure a day. He had CTA imaging on 11/14/21 that showed concern for metastic disease. Biapical cavitary lesions are again demonstrated with extensive necrotic appearing tumor on the right side measuring a maximum 4 cm. He has an apt with oncology on 12/14. Denies nausea or vomiting. He is still smoking 15-20 cigarettes a day, he is not ready  to quit.   12/11/2021- Interim hx  Since his last visit he has completed antibiotics from 11/17-11/27. He is present with his sister and caregiver. He reports ongoing shortness of breath and productive cough. Uses his Stiolto daily and Duonebs twice a day. He continues to be an active smoker 1ppd. He was recently seen by his oncologist for recent CT regarding concern for metastatic disease with mediastinal/hilar lymphadenopathy  Trach Brand: Shiley Size:  4.0  flex  Style: Uncuffed Secured by: Velcro  Allergies  Allergen Reactions   Fish Allergy Other (See Comments)    Breaks out    Immunization History  Administered Date(s) Administered   PFIZER(Purple Top)SARS-COV-2 Vaccination 05/18/2020, 06/08/2020   Pneumococcal Polysaccharide-23 08/07/2019    Past Medical History:  Diagnosis Date   Acute respiratory acidosis ([place]) 06/29/2019   Acute respiratory failure with hypoxia ([place]) 06/30/2019   Arthritis    Chronic anemia    baseline hgb 10-12   COPD (chronic obstructive pulmonary disease) ([place])    History of radiation therapy 08/17/19- 10/10/19   Larynx treated with 35 fractions of 2 Gy each to total 70 Gy.    Neck mass 08/05/2019    Tobacco History: Social History   Tobacco Use  Smoking Status Every Day   Packs/day: 1.00   Years: 45.00   Pack years: 45.00   Types: Cigarettes  Smokeless Tobacco Never  Tobacco Comments   he reports smoking 7-8 cigarettes daily   Ready to quit: Not Answered Counseling given: Not Answered Tobacco comments: he reports smoking 7-8 cigarettes daily   Outpatient Medications Prior to Visit  Medication Sig Dispense Refill   acetaminophen (TYLENOL) 500 MG tablet Take 500 mg by mouth every 6 (six) hours as needed for moderate pain or headache.     feeding supplement, ENSURE COMPLETE, (ENSURE COMPLETE) LIQD Take 237 mLs by mouth 3 (three) times daily between meals. 21330 mL 3   gabapentin (NEURONTIN) 300 MG capsule Take 1 capsule (300 mg total) by  mouth 2 (two) times daily. 60 capsule 3   Glycopyrrolate 1 MG/5ML SOLN Take 5 mLs (1 mg total) by mouth 3 (three) times daily as needed. (Patient not taking: Reported on 12/05/2021) 473 mL 1   guaiFENesin-dextromethorphan (ROBITUSSIN DM) 100-10 MG/5ML syrup Take 5 mLs by mouth every 4 (four) hours as needed for cough. 240 mL 1   ipratropium-albuterol (DUONEB) 0.5-2.5 (3) MG/3ML SOLN Take 3 mLs by nebulization in the morning, at noon, and at bedtime. (Patient taking differently: Take 3 mLs by nebulization in the morning and at bedtime.) 360 mL 6   magnesium hydroxide (MILK OF MAGNESIA) 400 MG/5ML suspension Take 15 mLs by mouth daily as needed for mild constipation.     metoprolol tartrate (LOPRESSOR) 25 MG tablet Take 0.5 tablets (12.5 mg total) by mouth 2 (two) times daily. For tachycardia (Patient not taking: Reported on 12/05/2021) 30 tablet 6   midodrine (PROAMATINE) 10 MG tablet Take 1 tablet (10 mg total) by mouth 3 (three) times daily with meals. [place]  tablet 1   mirtazapine (REMERON) 15 MG tablet Take 1 tablet (15 mg total) by mouth at bedtime. (Patient not taking: Reported on 12/05/2021) 30 tablet 3   Misc. Devices MISC Ensure complete 237 ml 90 cans/@ 3 cans per day Need for supplement, need for weight gain due to diagnosis of protein caloric malnutrition, squamous cell carcinoma of the larynx method of feeding : PO 90 each 5   Misc. Devices MISC 1 each by Does not apply route daily. Camwalker for R LE 1 each 0   pantoprazole (PROTONIX) 40 MG tablet Take 1 tablet (40 mg total) by mouth daily. 90 tablet 0   polyethylene glycol powder (GLYCOLAX/MIRALAX) 17 GM/SCOOP powder Take 17 g by mouth 2 (two) times daily as needed for mild constipation. 255 g 1   Tiotropium Bromide-Olodaterol (STIOLTO RESPIMAT) 2.5-2.5 MCG/ACT AERS Inhale 2 puffs into the lungs daily. 4 g 6   No facility-administered medications prior to visit.    Review of Systems  Review of Systems  Constitutional:  Negative for  chills, diaphoresis and fever.  HENT:  Negative for congestion.   Respiratory:  Positive for cough and shortness of breath. Negative for wheezing.   Cardiovascular:  Negative for chest pain, palpitations and leg swelling.    Physical Exam  BP 116/70 (BP Location: Left Arm, Patient Position: Sitting, Cuff Size: Normal)    Pulse 78    Ht 5\' 6"  (1.676 m)    Wt 83 lb 3.2 oz (37.7 kg)    SpO2 100%    BMI 13.43 kg/m   Physical Exam: General: Thin, chronically ill-appearing, no acute distress HENT: [place], AT Neck: Cuffless trach with PMV in place Eyes: EOMI, no scleral icterus Respiratory: Diminished breath sounds bilaterally.  No crackles, wheezing or rales Cardiovascular: RRR, -M/R/G, no JVD Extremities:-Edema,-tenderness Neuro: AAO x4, CNII-XII grossly intact Psych: Normal mood, normal affect  Lab Results:  CBC    Component Value Date/Time   WBC 8.5 12/05/2021 1103   WBC 8.3 10/14/2021 0148   RBC 3.58 (L) 12/05/2021 1103   HGB 8.7 (L) 12/05/2021 1103   HGB 8.7 (L) 11/07/2021 1427   HCT 29.8 (L) 12/05/2021 1103   HCT 28.6 (L) 11/07/2021 1427   PLT 463 (H) 12/05/2021 1103   PLT 550 (H) 11/07/2021 1427   MCV 83.2 12/05/2021 1103   MCV 81 11/07/2021 1427   MCH 24.3 (L) 12/05/2021 1103   MCHC 29.2 (L) 12/05/2021 1103   RDW 15.9 (H) 12/05/2021 1103   RDW 15.3 11/07/2021 1427   LYMPHSABS 1.2 12/05/2021 1103   LYMPHSABS 0.9 11/07/2021 1427   MONOABS 0.6 12/05/2021 1103   EOSABS 0.2 12/05/2021 1103   EOSABS 0.2 11/07/2021 1427   BASOSABS 0.1 12/05/2021 1103   BASOSABS 0.1 11/07/2021 1427    BMET    Component Value Date/Time   NA 140 12/05/2021 1103   NA 138 11/07/2021 1427   K 3.7 12/05/2021 1103   CL 103 12/05/2021 1103   CO2 28 12/05/2021 1103   GLUCOSE 103 (H) 12/05/2021 1103   BUN 12 12/05/2021 1103   BUN 11 11/07/2021 1427   CREATININE 0.64 12/05/2021 1103   CALCIUM 8.8 (L) 12/05/2021 1103   GFRNONAA >60 12/05/2021 1103   GFRAA 126 11/20/2020 1426   GFRAA >60  06/01/2020 1148    BNP No results found for: BNP  ProBNP No results found for: PROBNP  Imaging: CT Angio Chest W/Cm &/Or Wo Cm  Result Date: 11/14/2021 CLINICAL DATA:  History of squamous  cell carcinoma larynx with extensive metastatic disease. EXAM: CT ANGIOGRAPHY CHEST WITH CONTRAST TECHNIQUE: Multidetector CT imaging of the chest was performed using the standard protocol during bolus administration of intravenous contrast. Multiplanar CT image reconstructions and MIPs were obtained to evaluate the vascular anatomy. CONTRAST:  79mL OMNIPAQUE IOHEXOL 350 MG/ML SOLN COMPARISON:  Chest CT 10/12/2021 FINDINGS: Cardiovascular: The heart is within normal limits in size. No pericardial effusion. There is mild tortuosity of the thoracic aorta but no focal aneurysm or dissection. Scattered atherosclerotic calcifications. Scattered coronary artery calcifications. Suboptimal opacification of the pulmonary arteries. No obvious pulmonary emboli. Mediastinum/Nodes: Extensive mediastinal and right hilar lymphadenopathy. Right paratracheal node on image 30/4 measures 2.3 cm 17 mm precarinal lymph node on image 36/4. 2 cm AP window node on image 38/4 2.2 cm suprahilar node on image 39/4 2.4 cm subcarinal nodal mass on image 47/4 The tracheostomy tube is in good position. The esophagus is grossly normal. Lungs/Pleura: Biapical cavitary lesions are again demonstrated with extensive necrotic appearing tumor on the right side measuring a maximum 4 cm. Small rim of tumor on the left side. Extensive metastatic disease involving the lungs more notably on the right side with probable endobronchial spread of tumor throughout the right lung along with probable hematogenous Mets also. Small right pleural effusion. Upper Abdomen: Bizarre enhancement pattern in the liver could be due to portal vein thrombosis or diffuse metastatic disease. No obvious adrenal gland lesions or renal lesions. Musculoskeletal: The destructive bony  changes involving the right third posterior rib suggesting metastatic disease I do not see any other obvious bone lesions. Review of the MIP images confirms the above findings. IMPRESSION: 1. Suboptimal opacification of the pulmonary arteries but no obvious pulmonary emboli. 2. Extensive metastatic disease involving the chest as detailed above. 3. Destructive bony changes involving the right third posterior rib suggesting metastatic disease. 4. Bizarre enhancement pattern in the liver could be due to portal vein thrombosis or diffuse metastatic disease. 5. Aortic atherosclerosis. Aortic Atherosclerosis (ICD10-I70.0). Electronically Signed   By: Marijo Sanes M.D.   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 Recent chest imaging recently concerning for infection during his hospitalization in October 2022. He was treated with antibiotics 10/14-10/22 and again on 11/17-11/27. Repeat CT 11/14/21 with extensive mediastinal and right hilar lymphadenopathy, similar biapical cavitary lesions with necrotic tumor on the right measuring 4 cm, possible liver involvement and destructive bony changes in right posterior rib but no overt bone lesions.  Based on imaging, lung mass and mediastinal/right hilar adenopathy is highly concerning for malignancy. May potentially represent infection. We discussed diagnostic testing via bronchoscopy. After addressing patient/family's questions, patient wishes to pursue diagnostic testing via bronchoscopy. We discussed risks and benefits of procedure including infection, bleeding and lung collapse. Based on ARISCAT, he is intermediate (13.3%) risk of in-hospital post-op pulmonary complications including  respiratory failure, respiratory infection, pleural effusion, atelectasis, pneumothorax, bronchospasm, aspiration pneumonitis). Patient consented to procedure. Coordinated procedure with RN and OR scheduler.  Scheduled for 12/16/21 at 10 AM for EBUS  I have spent a total time of 60-minutes on the day of the appointment reviewing prior documentation, coordinating care and discussing medical diagnosis and plan with the patient/family. Past medical history, allergies, medications were reviewed.  Pertinent imaging, labs and tests included in this note have been reviewed and interpreted independently by me.  Daryus Sowash Rodman Pickle, MD 12/11/2021

## 2021-12-11 NOTE — Patient Instructions (Addendum)
Abnormal CT lung scan  SCHEDULE bronchoscopy with EBUS Await scheduling instructions  Emphysema CONTINUE Stiolo TWO puffs ONCE a day CONTINUE Duonebs THREE times a day We will request portable suction through DME

## 2021-12-11 NOTE — Progress Notes (Signed)
b  @Patient  ID: Perry Waller, male    DOB: [birth year], 60 y.o.   MRN: [id number]  Chief Complaint  Patient presents with   Follow-up    Patient states with little exertion he is short of breath. Wants to talk about a portable suction machine.     Referring provider: Charlott Rakes, MD  HPI: 60 year old male, current everyday smoker. PMH significant for COPD, centrilobular emphysema, aspiration pneumonia, malignant neoplasm supraglottis, tracheal stenosis following tracheostomy. Presents for follow-up.  Previous [facility] pulmonary encounter: 10/12/21- Dr. Loanne Drilling  Perry Waller is a 60 year old male with head and neck cancer s/p chemoradiation s/p trach and PEG s/p removal who presents for follow-up.  Since our last visit, he reports his appetite has returned however has episodes of coughing that occur several minutes after eating and drinking. He does not cough with the act of eating but it seems to occur several minutes afterwards. Denies chest burning, abdominal pain, reflux or sour taste in his mouth, nausea or emesis. Does not occur when laying down. He is drinking more Ensure and attempting to get his weight up.   He did not pick up his Stioloto until yesterday. He has been using his albuterol 3-4 times at minimum daily. He received the prescription for his Duonebs however realized he did not have a nebulizer. He did not call the office as he planned to wait until this visit to inform me. He feels the Stiolto is making a tremendous difference. He has also cut down to 2-3 cigarettes daily from 1/2 ppd.  Social History: Active smoker. Previously 2ppd x 40 years. Currently 2-3 cigarettes daily.  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 Recently discharged from hospital about 1 mo ago after being treated for aspiration PNA. On CT imaging  he had dense RUL consolidation and loculated pleural fluid in RUL. He completed what looks like 10 d total course therapy. He had been doing ok until about 3 d ago when he began to notice worsening cough (this was thick, yellow/creamy sputum) fatigue, and increased SOB. He could not tell me if had fevers but overall he felt poor. He had presented today planned trach change in addition to the new complaints above  Impression/plan H/o head and neck cancer Trach dependence  Aspiration PNA w/ concern for evolving cavitary PNA Severe protein calorie malnutrition    Discussion Frail I think he got worse after stopping abx. Prob needs longer course. Also can't exclude underlying malignancy hiding in the dense consolidative airspace disease.  Luckily he does have CT chest ordered for follow up today at [facility] and he is headed there today    Plan I have given him a script for 10 days of augmentin He will follow up in pulmonary on 22nd at 2 pm (I called his sister to let her know)  F/u with me in 12 weeks for trach change   11/19/21 - NP [name] Patient presents today for hospital follow-up. He was admitted for three days in October 2022 for aspiration pneumonia. Treated  He follows with Marni Griffon in tracheostomy clinic, last seen on 11/14/21 for trach change and cleaning. Sent in Rx Augmentin x 10 days during that visit. He continues to have a productive cough with thick, clear mucus. He is not currently taking anything OTC for cough. He is  compliant with nebulizer three times a day as scheduled. He has lost weight recently. He is not able to eat meals. Drinks 3-4 ensure a day. He had CTA imaging on 11/14/21 that showed concern for metastic disease. Biapical cavitary lesions are again demonstrated with extensive necrotic appearing tumor on the right side measuring a maximum 4 cm. He has an apt with oncology on 12/14. Denies nausea or vomiting. He is still smoking 15-20 cigarettes a day, he is not ready  to quit.   12/11/2021- Interim hx  Since his last visit he has completed antibiotics from 11/17-11/27. He is present with his sister and caregiver. He reports ongoing shortness of breath and productive cough. Uses his Stiolto daily and Duonebs twice a day. He continues to be an active smoker 1ppd. He was recently seen by his oncologist for recent CT regarding concern for metastatic disease with mediastinal/hilar lymphadenopathy  Trach Brand: Shiley Size:  4.0  flex  Style: Uncuffed Secured by: Velcro  Allergies  Allergen Reactions   Fish Allergy Other (See Comments)    Breaks out    Immunization History  Administered Date(s) Administered   PFIZER(Purple Top)SARS-COV-2 Vaccination 05/18/2020, 06/08/2020   Pneumococcal Polysaccharide-23 08/07/2019    Past Medical History:  Diagnosis Date   Acute respiratory acidosis ([place]) 06/29/2019   Acute respiratory failure with hypoxia ([place]) 06/30/2019   Arthritis    Chronic anemia    baseline hgb 10-12   COPD (chronic obstructive pulmonary disease) ([place])    History of radiation therapy 08/17/19- 10/10/19   Larynx treated with 35 fractions of 2 Gy each to total 70 Gy.    Neck mass 08/05/2019    Tobacco History: Social History   Tobacco Use  Smoking Status Every Day   Packs/day: 1.00   Years: 45.00   Pack years: 45.00   Types: Cigarettes  Smokeless Tobacco Never  Tobacco Comments   he reports smoking 7-8 cigarettes daily   Ready to quit: Not Answered Counseling given: Not Answered Tobacco comments: he reports smoking 7-8 cigarettes daily   Outpatient Medications Prior to Visit  Medication Sig Dispense Refill   acetaminophen (TYLENOL) 500 MG tablet Take 500 mg by mouth every 6 (six) hours as needed for moderate pain or headache.     feeding supplement, ENSURE COMPLETE, (ENSURE COMPLETE) LIQD Take 237 mLs by mouth 3 (three) times daily between meals. 21330 mL 3   gabapentin (NEURONTIN) 300 MG capsule Take 1 capsule (300 mg total) by  mouth 2 (two) times daily. 60 capsule 3   Glycopyrrolate 1 MG/5ML SOLN Take 5 mLs (1 mg total) by mouth 3 (three) times daily as needed. (Patient not taking: Reported on 12/05/2021) 473 mL 1   guaiFENesin-dextromethorphan (ROBITUSSIN DM) 100-10 MG/5ML syrup Take 5 mLs by mouth every 4 (four) hours as needed for cough. 240 mL 1   ipratropium-albuterol (DUONEB) 0.5-2.5 (3) MG/3ML SOLN Take 3 mLs by nebulization in the morning, at noon, and at bedtime. (Patient taking differently: Take 3 mLs by nebulization in the morning and at bedtime.) 360 mL 6   magnesium hydroxide (MILK OF MAGNESIA) 400 MG/5ML suspension Take 15 mLs by mouth daily as needed for mild constipation.     metoprolol tartrate (LOPRESSOR) 25 MG tablet Take 0.5 tablets (12.5 mg total) by mouth 2 (two) times daily. For tachycardia (Patient not taking: Reported on 12/05/2021) 30 tablet 6   midodrine (PROAMATINE) 10 MG tablet Take 1 tablet (10 mg total) by mouth 3 (three) times daily with meals. [place]  tablet 1   mirtazapine (REMERON) 15 MG tablet Take 1 tablet (15 mg total) by mouth at bedtime. (Patient not taking: Reported on 12/05/2021) 30 tablet 3   Misc. Devices MISC Ensure complete 237 ml 90 cans/@ 3 cans per day Need for supplement, need for weight gain due to diagnosis of protein caloric malnutrition, squamous cell carcinoma of the larynx method of feeding : PO 90 each 5   Misc. Devices MISC 1 each by Does not apply route daily. Camwalker for R LE 1 each 0   pantoprazole (PROTONIX) 40 MG tablet Take 1 tablet (40 mg total) by mouth daily. 90 tablet 0   polyethylene glycol powder (GLYCOLAX/MIRALAX) 17 GM/SCOOP powder Take 17 g by mouth 2 (two) times daily as needed for mild constipation. 255 g 1   Tiotropium Bromide-Olodaterol (STIOLTO RESPIMAT) 2.5-2.5 MCG/ACT AERS Inhale 2 puffs into the lungs daily. 4 g 6   No facility-administered medications prior to visit.    Review of Systems  Review of Systems  Constitutional:  Negative for  chills, diaphoresis and fever.  HENT:  Negative for congestion.   Respiratory:  Positive for cough and shortness of breath. Negative for wheezing.   Cardiovascular:  Negative for chest pain, palpitations and leg swelling.    Physical Exam  BP 116/70 (BP Location: Left Arm, Patient Position: Sitting, Cuff Size: Normal)    Pulse 78    Ht 5\' 6"  (1.676 m)    Wt 83 lb 3.2 oz (37.7 kg)    SpO2 100%    BMI 13.43 kg/m   Physical Exam: General: Thin, chronically ill-appearing, no acute distress HENT: [place], AT Neck: Cuffless trach with PMV in place Eyes: EOMI, no scleral icterus Respiratory: Diminished breath sounds bilaterally.  No crackles, wheezing or rales Cardiovascular: RRR, -M/R/G, no JVD Extremities:-Edema,-tenderness Neuro: AAO x4, CNII-XII grossly intact Psych: Normal mood, normal affect  Lab Results:  CBC    Component Value Date/Time   WBC 8.5 12/05/2021 1103   WBC 8.3 10/14/2021 0148   RBC 3.58 (L) 12/05/2021 1103   HGB 8.7 (L) 12/05/2021 1103   HGB 8.7 (L) 11/07/2021 1427   HCT 29.8 (L) 12/05/2021 1103   HCT 28.6 (L) 11/07/2021 1427   PLT 463 (H) 12/05/2021 1103   PLT 550 (H) 11/07/2021 1427   MCV 83.2 12/05/2021 1103   MCV 81 11/07/2021 1427   MCH 24.3 (L) 12/05/2021 1103   MCHC 29.2 (L) 12/05/2021 1103   RDW 15.9 (H) 12/05/2021 1103   RDW 15.3 11/07/2021 1427   LYMPHSABS 1.2 12/05/2021 1103   LYMPHSABS 0.9 11/07/2021 1427   MONOABS 0.6 12/05/2021 1103   EOSABS 0.2 12/05/2021 1103   EOSABS 0.2 11/07/2021 1427   BASOSABS 0.1 12/05/2021 1103   BASOSABS 0.1 11/07/2021 1427    BMET    Component Value Date/Time   NA 140 12/05/2021 1103   NA 138 11/07/2021 1427   K 3.7 12/05/2021 1103   CL 103 12/05/2021 1103   CO2 28 12/05/2021 1103   GLUCOSE 103 (H) 12/05/2021 1103   BUN 12 12/05/2021 1103   BUN 11 11/07/2021 1427   CREATININE 0.64 12/05/2021 1103   CALCIUM 8.8 (L) 12/05/2021 1103   GFRNONAA >60 12/05/2021 1103   GFRAA 126 11/20/2020 1426   GFRAA >60  06/01/2020 1148    BNP No results found for: BNP  ProBNP No results found for: PROBNP  Imaging: CT Angio Chest W/Cm &/Or Wo Cm  Result Date: 11/14/2021 CLINICAL DATA:  History of squamous  cell carcinoma larynx with extensive metastatic disease. EXAM: CT ANGIOGRAPHY CHEST WITH CONTRAST TECHNIQUE: Multidetector CT imaging of the chest was performed using the standard protocol during bolus administration of intravenous contrast. Multiplanar CT image reconstructions and MIPs were obtained to evaluate the vascular anatomy. CONTRAST:  89mL OMNIPAQUE IOHEXOL 350 MG/ML SOLN COMPARISON:  Chest CT 10/12/2021 FINDINGS: Cardiovascular: The heart is within normal limits in size. No pericardial effusion. There is mild tortuosity of the thoracic aorta but no focal aneurysm or dissection. Scattered atherosclerotic calcifications. Scattered coronary artery calcifications. Suboptimal opacification of the pulmonary arteries. No obvious pulmonary emboli. Mediastinum/Nodes: Extensive mediastinal and right hilar lymphadenopathy. Right paratracheal node on image 30/4 measures 2.3 cm 17 mm precarinal lymph node on image 36/4. 2 cm AP window node on image 38/4 2.2 cm suprahilar node on image 39/4 2.4 cm subcarinal nodal mass on image 47/4 The tracheostomy tube is in good position. The esophagus is grossly normal. Lungs/Pleura: Biapical cavitary lesions are again demonstrated with extensive necrotic appearing tumor on the right side measuring a maximum 4 cm. Small rim of tumor on the left side. Extensive metastatic disease involving the lungs more notably on the right side with probable endobronchial spread of tumor throughout the right lung along with probable hematogenous Mets also. Small right pleural effusion. Upper Abdomen: Bizarre enhancement pattern in the liver could be due to portal vein thrombosis or diffuse metastatic disease. No obvious adrenal gland lesions or renal lesions. Musculoskeletal: The destructive bony  changes involving the right third posterior rib suggesting metastatic disease I do not see any other obvious bone lesions. Review of the MIP images confirms the above findings. IMPRESSION: 1. Suboptimal opacification of the pulmonary arteries but no obvious pulmonary emboli. 2. Extensive metastatic disease involving the chest as detailed above. 3. Destructive bony changes involving the right third posterior rib suggesting metastatic disease. 4. Bizarre enhancement pattern in the liver could be due to portal vein thrombosis or diffuse metastatic disease. 5. Aortic atherosclerosis. Aortic Atherosclerosis (ICD10-I70.0). Electronically Signed   By: Marijo Sanes M.D.   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 Recent chest imaging recently concerning for infection during his hospitalization in October 2022. He was treated with antibiotics 10/14-10/22 and again on 11/17-11/27. Repeat CT 11/14/21 with extensive mediastinal and right hilar lymphadenopathy, similar biapical cavitary lesions with necrotic tumor on the right measuring 4 cm, possible liver involvement and destructive bony changes in right posterior rib but no overt bone lesions.  Based on imaging, lung mass and mediastinal/right hilar adenopathy is highly concerning for malignancy. May potentially represent infection. We discussed diagnostic testing via bronchoscopy. After addressing patient/family's questions, patient wishes to pursue diagnostic testing via bronchoscopy. We discussed risks and benefits of procedure including infection, bleeding and lung collapse. Based on ARISCAT, he is intermediate (13.3%) risk of in-hospital post-op pulmonary complications including  respiratory failure, respiratory infection, pleural effusion, atelectasis, pneumothorax, bronchospasm, aspiration pneumonitis). Patient consented to procedure. Coordinated procedure with RN and OR scheduler.  Scheduled for 12/16/21 at 10 AM for EBUS  I have spent a total time of 60-minutes on the day of the appointment reviewing prior documentation, coordinating care and discussing medical diagnosis and plan with the patient/family. Past medical history, allergies, medications were reviewed.  Pertinent imaging, labs and tests included in this note have been reviewed and interpreted independently by me.  Jossilyn Benda Rodman Pickle, MD 12/11/2021

## 2021-12-12 NOTE — Telephone Encounter (Signed)
Perry Waller was called and asked if orders can be faxed over and PCP will sign off and they will be faxed to [facility].

## 2021-12-13 ENCOUNTER — Other Ambulatory Visit: Payer: Self-pay | Admitting: Pulmonary Disease

## 2021-12-13 ENCOUNTER — Encounter: Payer: Self-pay | Admitting: *Deleted

## 2021-12-13 LAB — SARS CORONAVIRUS 2 (TAT 6-24 HRS): SARS Coronavirus 2: NEGATIVE

## 2021-12-13 NOTE — Progress Notes (Signed)
Oncology Nurse Navigator Documentation  Oncology Nurse Navigator Flowsheets 12/13/2021 05/21/2021 09/07/2020 08/31/2020 08/28/2020 06/01/2020 05/07/2020  Abnormal Finding Date - - - - - - -  Confirmed Diagnosis Date - - - - - - -  Diagnosis Status - - - - - - -  Planned Course of Treatment - - - - - - -  Phase of Treatment - - - - - - -  Radiation Actual Start Date: - - - - - - -  Radiation Expected End Date: - - - - - - -  Radiation Actual End Date: - - - - - - -  Navigator Follow Up Reason: - - PEG Removal - Other: - -  Navigator Location [facility] [facility] [facility] [facility]  Referral Date to RadOnc/MedOnc - - - - - - -  Navigator Encounter Type Clinic/MDC;Initial MedOnc;Other: Telephone - Telephone Other: Telephone Telephone  Telephone - Outgoing Call - Outgoing Call - Appt Confirmation/Clarification Appt Confirmation/Clarification  Multidisiplinary Clinic Date - - - - - - -  [place] Initiated Date - - - - - - -  Patient Visit Type Initial;MedOnc;Other - Other - Other Other -  Treatment Phase Abnormal Scans Other Other Post-Tx Follow-up Post-Tx Follow-up Post-Tx Follow-up -  Barriers/Navigation Needs Coordination of Care;Education/I spoke to Mr. Mahr at his first appt to see med onc.  Mr. Carley has hx head and neck cancer and  has a lung mass which needs further workup.  I spoke to patient, his sister, and Jeneen Rinks his nurse at his first visit.  Patient needs PET, MRI brain, and bx per Dr. Julien Nordmann. I help to explain plan of care to patient.  I followed up today and patients plan of care is schedule bx 12/19, mri brain 12/20, PET 12/21, and follow up on 12/22. No other  barriers identified at this time. I will update H & N navigator.  Coordination of Care;Transportation Coordination of Care Coordination of Care;Education;Health Literacy;Morbidities/Frailty;Transportation Coordination  of Care;Education;Transportation Coordination of Care;Transportation Coordination of Care  Education Other - - Other Accessing Care/ Finding Providers;Coping with Diagnosis/ Prognosis - -  Interventions Coordination of Care;Psycho-Social Support Transportation;Coordination of Care - - - - -  Acuity Level 4-High Needs (Greater Than 4 Barriers Identified) Level 2-Minimal Needs (1-2 Barriers Identified) Level 2-Minimal Needs (1-2 Barriers Identified) Level 4-High Needs (Greater Than 4 Barriers Identified) Level 3-Moderate Needs (3-4 Barriers Identified) Level 2-Minimal Needs (1-2 Barriers Identified) Level 2-Minimal Needs (1-2 Barriers Identified)  Coordination of Care Other Appts;Other - - - - -  Education Method - Verbal - Verbal Verbal - -  Time Spent with Patient 45 15 15 15 15 15  15

## 2021-12-16 ENCOUNTER — Ambulatory Visit (HOSPITAL_COMMUNITY): Payer: Medicaid Other | Admitting: Anesthesiology

## 2021-12-16 ENCOUNTER — Encounter (HOSPITAL_COMMUNITY)
Admission: RE | Disposition: A | Discharge status: Home / Self Care | Payer: Self-pay | Source: Ambulatory Visit | Attending: Pulmonary Disease

## 2021-12-16 ENCOUNTER — Other Ambulatory Visit: Payer: Self-pay

## 2021-12-16 ENCOUNTER — Ambulatory Visit (HOSPITAL_COMMUNITY)
Admission: RE | Admit: 2021-12-16 | Discharge: 2021-12-16 | Disposition: A | Discharge status: Home / Self Care | Payer: Medicaid Other | Source: Ambulatory Visit | Attending: Pulmonary Disease | Admitting: Pulmonary Disease

## 2021-12-16 ENCOUNTER — Encounter (HOSPITAL_COMMUNITY): Payer: Self-pay | Admitting: Pulmonary Disease

## 2021-12-16 DIAGNOSIS — R590 Localized enlarged lymph nodes: Secondary | ICD-10-CM | POA: Diagnosis not present

## 2021-12-16 DIAGNOSIS — J432 Centrilobular emphysema: Secondary | ICD-10-CM | POA: Insufficient documentation

## 2021-12-16 DIAGNOSIS — R846 Abnormal cytological findings in specimens from respiratory organs and thorax: Secondary | ICD-10-CM | POA: Diagnosis not present

## 2021-12-16 DIAGNOSIS — F17210 Nicotine dependence, cigarettes, uncomplicated: Secondary | ICD-10-CM | POA: Diagnosis not present

## 2021-12-16 DIAGNOSIS — Z930 Tracheostomy status: Secondary | ICD-10-CM | POA: Insufficient documentation

## 2021-12-16 DIAGNOSIS — R918 Other nonspecific abnormal finding of lung field: Secondary | ICD-10-CM | POA: Diagnosis not present

## 2021-12-16 DIAGNOSIS — Z8521 Personal history of malignant neoplasm of larynx: Secondary | ICD-10-CM | POA: Diagnosis not present

## 2021-12-16 HISTORY — PX: VIDEO BRONCHOSCOPY: SHX5072

## 2021-12-16 HISTORY — PX: BRONCHIAL WASHINGS: SHX5105

## 2021-12-16 HISTORY — PX: ENDOBRONCHIAL ULTRASOUND: SHX5096

## 2021-12-16 HISTORY — PX: BRONCHIAL BRUSHINGS: SHX5108

## 2021-12-16 HISTORY — PX: BRONCHIAL NEEDLE ASPIRATION BIOPSY: SHX5106

## 2021-12-16 LAB — COMPREHENSIVE METABOLIC PANEL
ALT: 7 U/L (ref 0–44)
AST: 17 U/L (ref 15–41)
Albumin: 2.9 g/dL — ABNORMAL LOW (ref 3.5–5.0)
Alkaline Phosphatase: 75 U/L (ref 38–126)
Anion gap: 12 (ref 5–15)
BUN: 16 mg/dL (ref 6–20)
CO2: 26 mmol/L (ref 22–32)
Calcium: 9.2 mg/dL (ref 8.9–10.3)
Chloride: 100 mmol/L (ref 98–111)
Creatinine, Ser: 0.63 mg/dL (ref 0.61–1.24)
GFR, Estimated: >60 mL/min (ref >60)
Glucose, Bld: 92 mg/dL (ref 70–99)
Potassium: 3.6 mmol/L (ref 3.5–5.1)
Sodium: 138 mmol/L (ref 135–145)
Total Bilirubin: 1 mg/dL (ref 0.3–1.2)
Total Protein: 8.1 g/dL (ref 6.5–8.1)

## 2021-12-16 LAB — PROTIME-INR
INR: 1.2 (ref 0.8–1.2)
Prothrombin Time: 14.8 seconds (ref 11.4–15.2)

## 2021-12-16 LAB — BODY FLUID CELL COUNT WITH DIFFERENTIAL
Eos, Fluid: 0 %
Lymphs, Fluid: 4 %
Monocyte-Macrophage-Serous Fluid: 6 % — ABNORMAL LOW (ref 50–90)
Neutrophil Count, Fluid: 90 % — ABNORMAL HIGH (ref 0–25)
Total Nucleated Cell Count, Fluid: 770 cu mm (ref 0–1000)

## 2021-12-16 LAB — CBC
HCT: 31 % — ABNORMAL LOW (ref 39.0–52.0)
Hemoglobin: 8.9 g/dL — ABNORMAL LOW (ref 13.0–17.0)
MCH: 24.3 pg — ABNORMAL LOW (ref 26.0–34.0)
MCHC: 28.7 g/dL — ABNORMAL LOW (ref 30.0–36.0)
MCV: 84.7 fL (ref 80.0–100.0)
Platelets: 493 10*3/uL — ABNORMAL HIGH (ref 150–400)
RBC: 3.66 MIL/uL — ABNORMAL LOW (ref 4.22–5.81)
RDW: 16.5 % — ABNORMAL HIGH (ref 11.5–15.5)
WBC: 7.9 10*3/uL (ref 4.0–10.5)
nRBC: 0 % (ref 0.0–0.2)

## 2021-12-16 LAB — APTT: aPTT: 32 seconds (ref 24–36)

## 2021-12-16 SURGERY — VIDEO BRONCHOSCOPY WITHOUT FLUORO
Anesthesia: General | Laterality: Bilateral

## 2021-12-16 MED ORDER — ONDANSETRON HCL 4 MG/2ML IJ SOLN
INTRAMUSCULAR | Status: DC | PRN
  Administered 2021-12-16: 4 mg via INTRAVENOUS

## 2021-12-16 MED ORDER — PROPOFOL 10 MG/ML IV BOLUS
INTRAVENOUS | Status: DC | PRN
  Administered 2021-12-16: 40 mg via INTRAVENOUS

## 2021-12-16 MED ORDER — DEXAMETHASONE SODIUM PHOSPHATE 10 MG/ML IJ SOLN
INTRAMUSCULAR | Status: DC | PRN
  Administered 2021-12-16: 5 mg via INTRAVENOUS

## 2021-12-16 MED ORDER — ROCURONIUM BROMIDE 10 MG/ML (PF) SYRINGE
PREFILLED_SYRINGE | INTRAVENOUS | Status: DC | PRN
  Administered 2021-12-16: 50 mg via INTRAVENOUS
  Administered 2021-12-16: 10 mg via INTRAVENOUS

## 2021-12-16 MED ORDER — PHENYLEPHRINE HCL (PRESSORS) 10 MG/ML IV SOLN
INTRAVENOUS | Status: AC
  Filled 2021-12-16: qty 1

## 2021-12-16 MED ORDER — PHENYLEPHRINE HCL-NACL 20-0.9 MG/250ML-% IV SOLN
INTRAVENOUS | Status: DC | PRN
  Administered 2021-12-16: 40 ug/min via INTRAVENOUS

## 2021-12-16 MED ORDER — LACTATED RINGERS IV SOLN: INTRAVENOUS | Status: DC

## 2021-12-16 MED ORDER — SUGAMMADEX SODIUM 200 MG/2ML IV SOLN
INTRAVENOUS | Status: DC | PRN
  Administered 2021-12-16: 100 mg via INTRAVENOUS

## 2021-12-16 MED ORDER — PHENYLEPHRINE 40 MCG/ML (10ML) SYRINGE FOR IV PUSH (FOR BLOOD PRESSURE SUPPORT)
PREFILLED_SYRINGE | INTRAVENOUS | Status: DC | PRN
  Administered 2021-12-16 (×2): 120 ug via INTRAVENOUS
  Administered 2021-12-16: 80 ug via INTRAVENOUS
  Administered 2021-12-16: 160 ug via INTRAVENOUS
  Administered 2021-12-16: 120 ug via INTRAVENOUS
  Administered 2021-12-16 (×2): 80 ug via INTRAVENOUS
  Administered 2021-12-16: 160 ug via INTRAVENOUS

## 2021-12-16 MED ORDER — FENTANYL CITRATE (PF) 100 MCG/2ML IJ SOLN
INTRAMUSCULAR | Status: DC | PRN
  Administered 2021-12-16: 50 ug via INTRAVENOUS

## 2021-12-16 MED ORDER — FENTANYL CITRATE (PF) 100 MCG/2ML IJ SOLN
INTRAMUSCULAR | Status: AC
  Filled 2021-12-16: qty 2

## 2021-12-16 MED ORDER — PROPOFOL 10 MG/ML IV BOLUS
INTRAVENOUS | Status: AC
  Filled 2021-12-16: qty 20

## 2021-12-16 NOTE — Interval H&P Note (Signed)
History and Physical Interval Note:  12/16/2021 10:41 AM  Tempie Hoist  has presented today for surgery, with the diagnosis of hilar adenopathy- lung mass.  The various methods of treatment have been discussed with the patient and family. After consideration of risks, benefits and other options for treatment, the patient has consented to  Procedure(s): VIDEO BRONCHOSCOPY WITHOUT FLUORO (Bilateral) ENDOBRONCHIAL ULTRASOUND (Bilateral) as a surgical intervention.  The patient's history has been reviewed, patient examined, no change in status, stable for surgery.  I have reviewed the patient's chart and labs.  Questions were answered to the patient's satisfaction.     Shakthi Scipio Rodman Pickle, MD

## 2021-12-16 NOTE — Discharge Instructions (Addendum)
Follow up with you Oncologist on 12/19/21

## 2021-12-16 NOTE — Transfer of Care (Signed)
Immediate Anesthesia Transfer of Care Note  Patient: Perry Waller  Procedure(s) Performed: VIDEO BRONCHOSCOPY WITHOUT FLUORO (Bilateral) ENDOBRONCHIAL ULTRASOUND (Bilateral) BRONCHIAL WASHINGS BRONCHIAL BRUSHINGS BRONCHIAL NEEDLE ASPIRATION BIOPSIES  Patient Location: PACU  Anesthesia Type:General  Level of Consciousness: sedated, patient cooperative and responds to stimulation  Airway & Oxygen Therapy: Patient Spontanous Breathing and Patient connected to face mask oxygen  Post-op Assessment: Report given to RN and Post -op Vital signs reviewed and stable  Post vital signs: Reviewed and stable  Last Vitals:  Vitals Value Taken Time  BP 101/71 12/16/21 1305  Temp    Pulse 73 12/16/21 1307  Resp 20 12/16/21 1307  SpO2 100 % 12/16/21 1307  Vitals shown include unvalidated device data.  Last Pain:  Vitals:   12/16/21 0821  TempSrc: Axillary  PainSc: 0-No pain         Complications: No notable events documented.

## 2021-12-16 NOTE — Anesthesia Preprocedure Evaluation (Addendum)
Anesthesia Evaluation  Patient identified by MRN, date of birth, ID band Patient awake    Reviewed: Allergy & Precautions, NPO status , Patient's Chart, lab work & pertinent test results  History of Anesthesia Complications Negative for: history of anesthetic complications  Airway Mallampati: Trach   Neck ROM: Full    Dental no notable dental hx.    Pulmonary COPD, Current Smoker and Patient abstained from smoking.,  H/o head and neck cancer, trach dependence   Pulmonary exam normal        Cardiovascular negative cardio ROS Normal cardiovascular exam     Neuro/Psych negative neurological ROS  negative psych ROS   GI/Hepatic negative GI ROS, Neg liver ROS,   Endo/Other  negative endocrine ROS  Renal/GU negative Renal ROS  negative genitourinary   Musculoskeletal  (+) Arthritis ,   Abdominal   Peds  Hematology  (+) anemia , Hgb 8.9   Anesthesia Other Findings Day of surgery medications reviewed with patient.  Reproductive/Obstetrics negative OB ROS                            Anesthesia Physical Anesthesia Plan  ASA: 3  Anesthesia Plan: General   Post-op Pain Management: Minimal or no pain anticipated   Induction: Intravenous  PONV Risk Score and Plan: 1 and Ondansetron, Dexamethasone and Treatment may vary due to age or medical condition  Airway Management Planned: Tracheostomy  Additional Equipment: None  Intra-op Plan:   Post-operative Plan: Extubation in OR  Informed Consent: I have reviewed the patients History and Physical, chart, labs and discussed the procedure including the risks, benefits and alternatives for the proposed anesthesia with the patient or authorized representative who has indicated his/her understanding and acceptance.       Plan Discussed with: CRNA  Anesthesia Plan Comments:        Anesthesia Quick Evaluation

## 2021-12-17 ENCOUNTER — Ambulatory Visit (HOSPITAL_COMMUNITY)
Admission: RE | Admit: 2021-12-17 | Discharge: 2021-12-17 | Disposition: A | Discharge status: Home / Self Care | Payer: Medicaid Other | Source: Ambulatory Visit | Attending: Internal Medicine | Admitting: Internal Medicine

## 2021-12-17 DIAGNOSIS — G319 Degenerative disease of nervous system, unspecified: Secondary | ICD-10-CM | POA: Diagnosis not present

## 2021-12-17 DIAGNOSIS — C3490 Malignant neoplasm of unspecified part of unspecified bronchus or lung: Secondary | ICD-10-CM

## 2021-12-17 LAB — CYTOLOGY - NON PAP

## 2021-12-17 MED ORDER — GADOBUTROL 1 MMOL/ML IV SOLN
4.0000 mL | Freq: Once | INTRAVENOUS | Status: AC | PRN
  Administered 2021-12-17: 23:00:00 4 mL via INTRAVENOUS

## 2021-12-17 NOTE — Anesthesia Postprocedure Evaluation (Signed)
Anesthesia Post Note  Patient: Perry Waller  Procedure(s) Performed: VIDEO BRONCHOSCOPY WITHOUT FLUORO (Bilateral) ENDOBRONCHIAL ULTRASOUND (Bilateral) BRONCHIAL WASHINGS BRONCHIAL BRUSHINGS BRONCHIAL NEEDLE ASPIRATION BIOPSIES     Patient location during evaluation: PACU Anesthesia Type: General Level of consciousness: awake and alert and oriented Pain management: pain level controlled Vital Signs Assessment: post-procedure vital signs reviewed and stable Respiratory status: spontaneous breathing, nonlabored ventilation and respiratory function stable Cardiovascular status: blood pressure returned to baseline Postop Assessment: no apparent nausea or vomiting Anesthetic complications: no   No notable events documented.  Last Vitals:  Vitals:   12/16/21 1330 12/16/21 1340  BP: 95/71 100/71  Pulse: 81   Resp: (!) 27   Temp: (!) 36.1 C   SpO2: 97%     Last Pain:  Vitals:   12/16/21 1340  TempSrc:   PainSc: 0-No pain                 Marthenia Rolling

## 2021-12-18 ENCOUNTER — Telehealth: Payer: Self-pay | Admitting: Pulmonary Disease

## 2021-12-18 ENCOUNTER — Other Ambulatory Visit: Payer: Self-pay

## 2021-12-18 ENCOUNTER — Ambulatory Visit (HOSPITAL_COMMUNITY)
Admission: RE | Admit: 2021-12-18 | Discharge: 2021-12-18 | Disposition: A | Discharge status: Home / Self Care | Payer: Medicaid Other | Source: Ambulatory Visit | Attending: Internal Medicine | Admitting: Internal Medicine

## 2021-12-18 DIAGNOSIS — C3490 Malignant neoplasm of unspecified part of unspecified bronchus or lung: Secondary | ICD-10-CM | POA: Diagnosis not present

## 2021-12-18 LAB — GLUCOSE, CAPILLARY: Glucose-Capillary: 90 mg/dL (ref 70–99)

## 2021-12-18 LAB — FUNGAL STAIN REFLEX

## 2021-12-18 LAB — FUNGUS STAIN

## 2021-12-18 LAB — CYTOLOGY - NON PAP

## 2021-12-18 MED ORDER — FLUDEOXYGLUCOSE F - 18 (FDG) INJECTION
5.2000 | Freq: Once | INTRAVENOUS | Status: AC
  Administered 2021-12-18: 13:00:00 5.1 via INTRAVENOUS

## 2021-12-18 NOTE — Telephone Encounter (Signed)
[place] [place] [facility] Telephone Encounter  FYI Dr. Julien Nordmann  I have updated patient and his sister on his bronchoscopy results which demonstrated malignancy. He is scheduled for PET/CT today and his oncology visit tomorrow. We discussed patient's GOC and patient will await discussion with his oncologist. We briefly discussed palliative care +/- hospice given patient's overall health condition. His sister expresses understanding and agrees this would be appropriate but she will defer to the patient who wishes to hear all his options.   Staff- please schedule him for follow-up with me in 2 months

## 2021-12-18 NOTE — Telephone Encounter (Signed)
Left voicemail for patient to call back to schedule a 2 month follow up in February.

## 2021-12-19 ENCOUNTER — Inpatient Hospital Stay (HOSPITAL_BASED_OUTPATIENT_CLINIC_OR_DEPARTMENT_OTHER): Payer: Medicaid Other | Admitting: Internal Medicine

## 2021-12-19 ENCOUNTER — Telehealth: Payer: Self-pay | Admitting: Family Medicine

## 2021-12-19 ENCOUNTER — Telehealth: Payer: Self-pay | Admitting: Adult Health

## 2021-12-19 VITALS — BP 99/77 | HR 99 | Temp 97.1°F | Resp 18 | Ht 66.0 in | Wt 88.0 lb

## 2021-12-19 DIAGNOSIS — R5383 Other fatigue: Secondary | ICD-10-CM | POA: Diagnosis not present

## 2021-12-19 DIAGNOSIS — R0609 Other forms of dyspnea: Secondary | ICD-10-CM | POA: Diagnosis not present

## 2021-12-19 DIAGNOSIS — Z79899 Other long term (current) drug therapy: Secondary | ICD-10-CM | POA: Diagnosis not present

## 2021-12-19 DIAGNOSIS — R490 Dysphonia: Secondary | ICD-10-CM | POA: Diagnosis not present

## 2021-12-19 DIAGNOSIS — C7802 Secondary malignant neoplasm of left lung: Secondary | ICD-10-CM | POA: Diagnosis not present

## 2021-12-19 DIAGNOSIS — F17210 Nicotine dependence, cigarettes, uncomplicated: Secondary | ICD-10-CM | POA: Diagnosis not present

## 2021-12-19 DIAGNOSIS — C7951 Secondary malignant neoplasm of bone: Secondary | ICD-10-CM | POA: Diagnosis not present

## 2021-12-19 DIAGNOSIS — C321 Malignant neoplasm of supraglottis: Secondary | ICD-10-CM

## 2021-12-19 DIAGNOSIS — C7801 Secondary malignant neoplasm of right lung: Secondary | ICD-10-CM | POA: Diagnosis not present

## 2021-12-19 DIAGNOSIS — J90 Pleural effusion, not elsewhere classified: Secondary | ICD-10-CM | POA: Diagnosis not present

## 2021-12-19 DIAGNOSIS — R634 Abnormal weight loss: Secondary | ICD-10-CM | POA: Diagnosis not present

## 2021-12-19 DIAGNOSIS — R590 Localized enlarged lymph nodes: Secondary | ICD-10-CM | POA: Diagnosis not present

## 2021-12-19 DIAGNOSIS — C787 Secondary malignant neoplasm of liver and intrahepatic bile duct: Secondary | ICD-10-CM | POA: Diagnosis not present

## 2021-12-19 DIAGNOSIS — R630 Anorexia: Secondary | ICD-10-CM | POA: Diagnosis not present

## 2021-12-19 DIAGNOSIS — I700 Atherosclerosis of aorta: Secondary | ICD-10-CM | POA: Diagnosis not present

## 2021-12-19 DIAGNOSIS — J449 Chronic obstructive pulmonary disease, unspecified: Secondary | ICD-10-CM | POA: Diagnosis not present

## 2021-12-19 DIAGNOSIS — Z515 Encounter for palliative care: Secondary | ICD-10-CM | POA: Diagnosis not present

## 2021-12-19 DIAGNOSIS — M6281 Muscle weakness (generalized): Secondary | ICD-10-CM | POA: Diagnosis not present

## 2021-12-19 LAB — CULTURE, RESPIRATORY W GRAM STAIN

## 2021-12-19 LAB — ACID FAST SMEAR (AFB, MYCOBACTERIA): Acid Fast Smear: NEGATIVE

## 2021-12-19 MED ORDER — CIPROFLOXACIN HCL 500 MG PO TABS: 500.0000 mg | ORAL_TABLET | Freq: Two times a day (BID) | ORAL | 0 refills | Status: AC

## 2021-12-19 NOTE — Progress Notes (Deleted)
Pt referred to [facility]. Notified Margaretmary Eddy, RN

## 2021-12-19 NOTE — Progress Notes (Signed)
Per Dr Julien Nordmann pt Perry Waller is agreeable to Hospice. Referral needs to com e from PCP , Mena Pauls.  Margaretmary Eddy at [facility] notified.

## 2021-12-19 NOTE — Telephone Encounter (Signed)
I have responded to [place] already.

## 2021-12-19 NOTE — Op Note (Addendum)
[facility] Cardiopulmonary Patient Name: Perry Waller Procedure Date: 12/16/2021 MRN: [id number] Attending MD: Rodman Pickle , MD Date of Birth: October 17, 1961 CSN: [id number] Age: 60 Admit Type: Outpatient Ethnicity: Not Hispanic or Latino Procedure:             Bronchoscopy Indications:           Hilar lymphadenopathy of the right side, Mediastinal                         adenopathy Providers:             Rodman Pickle, MD, Doristine Johns, RN, Cletis Athens,                         Technician Referring MD:           Medicines:             General Anesthesia Complications:         No immediate complications Estimated Blood Loss:  Estimated blood loss was minimal. Procedure:      Pre-Anesthesia Assessment:      - A History and Physical has been performed. Patient meds and allergies       have been reviewed. The risks and benefits of the procedure and the       sedation options and risks were discussed with the patient. All       questions were answered and informed consent was obtained. Patient       identification and proposed procedure were verified prior to the       procedure by the physician in the pre-procedure area. Mental Status       Examination: normal. Airway Examination: normal oropharyngeal airway. CV       Examination: RRR, no murmurs, no S3 or S4. ASA Grade Assessment: III - A       patient with severe systemic disease. After reviewing the risks and       benefits, the patient was deemed in satisfactory condition to undergo       the procedure. The anesthesia plan was to use general anesthesia.       Immediately prior to administration of medications, the patient was       re-assessed for adequacy to receive sedatives. The heart rate,       respiratory rate, oxygen saturations, blood pressure, adequacy of       pulmonary ventilation, and response to care were monitored throughout       the procedure. The physical status of the patient was re-assessed after        the procedure.      After obtaining informed consent, the bronchoscope was passed under       direct vision. Throughout the procedure, the patient's blood pressure,       pulse, and oxygen saturations were monitored continuously. The patient       tolerated the procedure well. the BF-H190 ([id number]) Olympus bronchoscope       was introduced through the nose, via the endotracheal tube (the patient       was intubated for the procedure) and advanced to the tracheobronchial       tree. the BF-[id number] ([id number]) Olympus Ebus scope was introduced through       the and advanced to the. Findings:      The endotracheal tube is well positioned via the tracheostomy. The  visualized portion of the trachea is of normal caliber. The carina is       sharp. The tracheobronchial tree was examined to at least the first       subsegmental level. Mucosa with scattered dark pigmentation easily       suctioned, likely related to smoking. Bronchial mucosa and anatomy are       normal; there are no endobronchial lesions. Thin mucopurulent secretions       present throughout easily suctioned and cleared. Extinsic airway       compression of anterior branch of RUL and superior segment of RLL      Bronchoalveolar lavage was performed in the RUL anterior segment (B3) of       the lung and sent for cell count, bacterial culture, viral smears &       culture, and fungal & AFB analysis and cytology. 60 mL of fluid were       instilled. 20 mL were returned. The return was blood-tinged and cloudy.       There were no mucoid plugs in the return fluid.      Brushings were obtained in the anterior segment of the right upper lobe       with a cytology brush and sent for routine cytology.      Flexible bronchoscope exchanged for EBUS bronchoscope. Lymph node       surveillance performed. Station 4L, 7 and 4R/Right hilar mass was       sampled and sent for cytology. EBUS bronchoscope was removed and       flexible  bronchoscope inserted. Blood secretions suctioned and cleared       from airway. No evidence of active bleeding. Bronchoscope removed Impression:      - Hilar lymphadenopathy of the right side      - Mediastinal adenopathy      - The airway examination was normal.      - S/p BAL of anterior branch of RUL, brushing of anterior branch of RUL      - S/p TBNA of 4L, 7 and 4R/right hilar mass Moderate Sedation:      General anesthesia Recommendation:      - Await culture and cytology results. Procedure Code(s):      --- Professional ---      [phone], Bronchoscopy, rigid or flexible, including fluoroscopic guidance,       when performed; with endobronchial ultrasound (EBUS) guided       transtracheal and/or transbronchial sampling (eg,       aspiration[s]/biopsy[ies]), 3 or more mediastinal and/or hilar lymph       node stations or structures      [id number], Bronchoscopy, rigid or flexible, including fluoroscopic guidance,       when performed; with bronchial alveolar lavage      [id number], Bronchoscopy, rigid or flexible, including fluoroscopic guidance,       when performed; with brushing or protected brushings Diagnosis Code(s):      --- Professional ---      R59.0, Localized enlarged lymph nodes CPT copyright 2019 American Medical Association. All rights reserved. The codes documented in this report are preliminary and upon coder review may  be revised to meet current compliance requirements. Rodman Pickle, MD 12/24/2021 11:02:04 AM This report has been signed electronically. Number of Addenda: 0 Scope In: Scope Out:

## 2021-12-19 NOTE — Telephone Encounter (Signed)
I called and spoke with the patient sister (DPR) and she voices understanding. I have made the sister aware that the ABT has been sent in the [facility] per her request. She is aware of the recommendations and I have also made a follow up with the provider as well and sister confirmed the date she wanted and the time. Nothing further needed.

## 2021-12-19 NOTE — Telephone Encounter (Signed)
Resp cx + Psuedomonas and Proteus Mirabilis   Sensitive to Cipro   Begin Cipro 500mg  Twice daily for 14 days -Take with food  Begin probiotic daily   Needs ov with Dr. Loanne Drilling in 3-4 weeks for follow up  Please contact office for sooner follow up if symptoms do not improve or worsen or seek emergency care
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
## 2021-12-19 NOTE — Telephone Encounter (Signed)
Perry Waller called w/ [facility] states that Dr. Inda Merlin request to admit  pt to  hospice today and  needs referral from you and stating you will be attending PCP by  today please call if any questions...[phone]

## 2021-12-20 ENCOUNTER — Telehealth: Payer: Self-pay | Admitting: Family Medicine

## 2021-12-20 NOTE — Telephone Encounter (Signed)
Copied from [id number]. Topic: General - Other >> Dec 19, 2021 11:29 AM Pawlus, Brayton Layman A wrote: Reason for CRM: Caller stated she needs Dr Margarita Rana to send in orders or help get the patient into hospice care, please advise.

## 2021-12-20 NOTE — Telephone Encounter (Signed)
I received this message from hospice yesterday. Dr. Margarita Rana, my name is Margaretmary Eddy and I am the [place] for [employer]. This patient is not doing well and has been referred for hospice with family requesting a visit today. Dr. Julien Nordmann asked that we reach out to you for the order and we are requesting if you would like to be the attending of record. Trying to connect to see if we may proceed. Would you please call me at [phone] Thank you.  I gave my consent to proceed but then received this response from hospice.  Please provide patient's sister with information to reach back out to them if they are now interested.  You are a wonderful. We sent a nurse out and patient and sister do not want to give up the services they have 8 hours/day with home health, so for now he is going to remain with outpatient palliative.

## 2021-12-20 NOTE — Telephone Encounter (Signed)
Pt;s sister is requesting help with getting patient into hospice.

## 2021-12-21 LAB — AEROBIC/ANAEROBIC CULTURE W GRAM STAIN (SURGICAL/DEEP WOUND)

## 2021-12-23 NOTE — Telephone Encounter (Signed)
error 

## 2021-12-24 ENCOUNTER — Telehealth: Payer: Self-pay | Admitting: Pulmonary Disease

## 2021-12-24 ENCOUNTER — Telehealth: Payer: Self-pay | Admitting: Medical Oncology

## 2021-12-24 DIAGNOSIS — N390 Urinary tract infection, site not specified: Secondary | ICD-10-CM | POA: Diagnosis not present

## 2021-12-24 DIAGNOSIS — J449 Chronic obstructive pulmonary disease, unspecified: Secondary | ICD-10-CM | POA: Diagnosis not present

## 2021-12-24 DIAGNOSIS — J9610 Chronic respiratory failure, unspecified whether with hypoxia or hypercapnia: Secondary | ICD-10-CM | POA: Diagnosis not present

## 2021-12-24 DIAGNOSIS — J398 Other specified diseases of upper respiratory tract: Secondary | ICD-10-CM | POA: Diagnosis not present

## 2021-12-24 DIAGNOSIS — Z720 Tobacco use: Secondary | ICD-10-CM | POA: Diagnosis not present

## 2021-12-24 DIAGNOSIS — E43 Unspecified severe protein-calorie malnutrition: Secondary | ICD-10-CM | POA: Diagnosis not present

## 2021-12-24 DIAGNOSIS — D649 Anemia, unspecified: Secondary | ICD-10-CM | POA: Diagnosis not present

## 2021-12-24 DIAGNOSIS — E860 Dehydration: Secondary | ICD-10-CM | POA: Diagnosis not present

## 2021-12-24 DIAGNOSIS — C322 Malignant neoplasm of subglottis: Secondary | ICD-10-CM | POA: Diagnosis not present

## 2021-12-24 DIAGNOSIS — N178 Other acute kidney failure: Secondary | ICD-10-CM | POA: Diagnosis not present

## 2021-12-24 DIAGNOSIS — Q321 Other congenital malformations of trachea: Secondary | ICD-10-CM | POA: Diagnosis not present

## 2021-12-24 NOTE — Telephone Encounter (Signed)
Call placed to [facility] who explained that because the patient has private duty skilled nursing, they are not able to provide hospice care but they can continue with outpatient palliative care.  She said that she spoke to [facility] who confirmed that the patient receives private duty nursing ( RN/LPN) 8 hours x 7 days/week.  This is not an aide who provides PCS. The nurse suctions the patient and administers medications.  Informed her that this CM will check with Alvis Lemmings to clarify the type of nursing care and is it private pay or paid by [facility]  Call placed to [facility]. The call was eventually transferred to [place] division.  Spoke to [facility] [place] who that they are not a Medicare certified home health agency. She thinks Medicaid is paying for the services but will clarify with her billing office and call this CM back.

## 2021-12-24 NOTE — Telephone Encounter (Signed)
I spoke with [facility] and she wants to see if the provider had privileges at [facility] or another hospital. She is working on the authorization for the providers procedure and nothing else is needed. We will get a fax once a response is completed.

## 2021-12-24 NOTE — Telephone Encounter (Signed)
Hospice  referral - Pt is seen by [facility] and has a caregiver  8 hours /day for 7/days /week. Hospice needs to do additional investigation to see if they can provide services. He is staying palliative for now . If he is a CAPS pt then hospice can provide services. Linus Orn will keep [place] updated.

## 2021-12-25 ENCOUNTER — Telehealth: Payer: Self-pay

## 2021-12-25 ENCOUNTER — Ambulatory Visit: Payer: Medicaid Other | Attending: Family Medicine | Admitting: Family Medicine

## 2021-12-25 ENCOUNTER — Other Ambulatory Visit: Payer: Self-pay

## 2021-12-25 VITALS — BP 92/62 | HR 116 | Ht 66.0 in | Wt 87.0 lb

## 2021-12-25 DIAGNOSIS — Z515 Encounter for palliative care: Secondary | ICD-10-CM | POA: Diagnosis not present

## 2021-12-25 DIAGNOSIS — G629 Polyneuropathy, unspecified: Secondary | ICD-10-CM | POA: Diagnosis not present

## 2021-12-25 DIAGNOSIS — E43 Unspecified severe protein-calorie malnutrition: Secondary | ICD-10-CM

## 2021-12-25 DIAGNOSIS — R000 Tachycardia, unspecified: Secondary | ICD-10-CM

## 2021-12-25 DIAGNOSIS — M545 Low back pain, unspecified: Secondary | ICD-10-CM

## 2021-12-25 DIAGNOSIS — C321 Malignant neoplasm of supraglottis: Secondary | ICD-10-CM

## 2021-12-25 DIAGNOSIS — J432 Centrilobular emphysema: Secondary | ICD-10-CM

## 2021-12-25 MED ORDER — GABAPENTIN 300 MG PO CAPS: 600.0000 mg | ORAL_CAPSULE | Freq: Two times a day (BID) | ORAL | 3 refills | Status: AC

## 2021-12-25 MED ORDER — LIDOCAINE 5 % EX PTCH: 1.0000 | MEDICATED_PATCH | CUTANEOUS | 1 refills | Status: AC

## 2021-12-25 NOTE — Telephone Encounter (Signed)
RN call to scheduled follow up for palliative care. Spoke with sister Blain Pais and scheduled F/U appt scheduled for 01/01/22 at 11 am. Report to [place] NP

## 2021-12-25 NOTE — Telephone Encounter (Signed)
RN call to sister Blain Pais.  Palliative noted that patient was scheduled for follow up visit tomorrow from palliative NP, sister states that she needs to cancel because she has to work.  Will keep scheduled appt for palliative visit for 01/01/22 at 11am. Team updated
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
## 2021-12-25 NOTE — Progress Notes (Signed)
SOB [place] back pain requesting medication. Leg and hand pain.

## 2021-12-26 ENCOUNTER — Encounter: Payer: Self-pay | Admitting: Family Medicine

## 2021-12-26 NOTE — Telephone Encounter (Signed)
Copied from [place] [id number]. Topic: General - Other >> Dec 26, 2021  2:44 PM Fields, Museum/gallery conservator R wrote: Reason for CRM: caller from [facility] calling requesting last two  previous [place] orders to be faxed today to [phone]

## 2021-12-27 ENCOUNTER — Telehealth: Payer: Self-pay

## 2021-12-27 NOTE — Telephone Encounter (Signed)
Lidocaine 5% patch PA has been denied.  Medical  criteria not met.  Patient must have a history of trial and failure of 2 groups of meds; tri-cyclic antidepressants,ssri,snri,anticonvulsants,nsaids,cox II's and have a chronic pain diagnosis greater than 6 months in duration.

## 2021-12-27 NOTE — Telephone Encounter (Signed)
Last set of orders signed by PCP has been faxed.

## 2021-12-29 DIAGNOSIS — E860 Dehydration: Secondary | ICD-10-CM | POA: Diagnosis not present

## 2021-12-29 DIAGNOSIS — Q321 Other congenital malformations of trachea: Secondary | ICD-10-CM | POA: Diagnosis not present

## 2021-12-29 DIAGNOSIS — E43 Unspecified severe protein-calorie malnutrition: Secondary | ICD-10-CM | POA: Diagnosis not present

## 2021-12-29 DIAGNOSIS — N390 Urinary tract infection, site not specified: Secondary | ICD-10-CM | POA: Diagnosis not present

## 2021-12-29 DIAGNOSIS — J398 Other specified diseases of upper respiratory tract: Secondary | ICD-10-CM | POA: Diagnosis not present

## 2021-12-29 DIAGNOSIS — D649 Anemia, unspecified: Secondary | ICD-10-CM | POA: Diagnosis not present

## 2021-12-29 DIAGNOSIS — N178 Other acute kidney failure: Secondary | ICD-10-CM | POA: Diagnosis not present

## 2021-12-29 DIAGNOSIS — Z720 Tobacco use: Secondary | ICD-10-CM | POA: Diagnosis not present

## 2021-12-29 DIAGNOSIS — J449 Chronic obstructive pulmonary disease, unspecified: Secondary | ICD-10-CM | POA: Diagnosis not present

## 2021-12-29 DIAGNOSIS — J9610 Chronic respiratory failure, unspecified whether with hypoxia or hypercapnia: Secondary | ICD-10-CM | POA: Diagnosis not present

## 2021-12-29 DIAGNOSIS — C322 Malignant neoplasm of subglottis: Secondary | ICD-10-CM | POA: Diagnosis not present

## 2021-12-30 DIAGNOSIS — N178 Other acute kidney failure: Secondary | ICD-10-CM | POA: Diagnosis not present

## 2021-12-30 DIAGNOSIS — Q321 Other congenital malformations of trachea: Secondary | ICD-10-CM | POA: Diagnosis not present

## 2021-12-30 DIAGNOSIS — J398 Other specified diseases of upper respiratory tract: Secondary | ICD-10-CM | POA: Diagnosis not present

## 2021-12-30 DIAGNOSIS — D649 Anemia, unspecified: Secondary | ICD-10-CM | POA: Diagnosis not present

## 2021-12-30 DIAGNOSIS — Z720 Tobacco use: Secondary | ICD-10-CM | POA: Diagnosis not present

## 2021-12-30 DIAGNOSIS — J9610 Chronic respiratory failure, unspecified whether with hypoxia or hypercapnia: Secondary | ICD-10-CM | POA: Diagnosis not present

## 2021-12-30 DIAGNOSIS — N390 Urinary tract infection, site not specified: Secondary | ICD-10-CM | POA: Diagnosis not present

## 2021-12-30 DIAGNOSIS — E43 Unspecified severe protein-calorie malnutrition: Secondary | ICD-10-CM | POA: Diagnosis not present

## 2021-12-30 DIAGNOSIS — E860 Dehydration: Secondary | ICD-10-CM | POA: Diagnosis not present

## 2021-12-30 DIAGNOSIS — J449 Chronic obstructive pulmonary disease, unspecified: Secondary | ICD-10-CM | POA: Diagnosis not present

## 2021-12-30 DIAGNOSIS — C322 Malignant neoplasm of subglottis: Secondary | ICD-10-CM | POA: Diagnosis not present

## 2022-01-01 ENCOUNTER — Other Ambulatory Visit: Payer: Medicaid Other | Admitting: Hospice

## 2022-01-01 ENCOUNTER — Other Ambulatory Visit: Payer: Self-pay

## 2022-01-01 ENCOUNTER — Telehealth: Payer: Self-pay

## 2022-01-01 ENCOUNTER — Ambulatory Visit: Payer: Medicaid Other | Admitting: Family Medicine

## 2022-01-01 DIAGNOSIS — R918 Other nonspecific abnormal finding of lung field: Secondary | ICD-10-CM

## 2022-01-01 DIAGNOSIS — R1310 Dysphagia, unspecified: Secondary | ICD-10-CM

## 2022-01-01 DIAGNOSIS — Z515 Encounter for palliative care: Secondary | ICD-10-CM

## 2022-01-01 NOTE — Progress Notes (Signed)
[place] Consult Note Telephone: [phone]  Fax: [phone]  PATIENT NAME: Perry Waller DOB: Aug 16, 1961 MRN: [id number]    REFERRING PROVIDER:Dr. Eppie Gibson Responsible Party: Self/Sister -Perry Waller Emergency Contact: sisterLajoyce Corners Henrrietta [phone]    TELEHEALTH VISIT STATEMENT Due to the COVID-19 crisis, this visit was done via telemedicine from my office and it was initiated and consent by this patient and or family. Video-audio (telehealth) contact was unable to be done due to technical barriers from the patients side. I connected with patient OR PROXY by a telephone  and verified that I am speaking with the correct person. I discussed the limitations of evaluation and management by telemedicine. The patient expressed understanding and agreed to proceed.   Visit is to build trust and highlight Palliative Medicine as specialized medical care for people living with serious illness, aimed at facilitating better quality of life through symptoms relief, assisting with advance care plan and establishing goals of care.    CHIEF COMPLAINT: Follow up visit  RECOMMENDATIONS/PLAN:   Advance Care Planning/Code Status: Code status reviewed.  Patient is a DO NOT RESUSCITATE.  2. Goals of Care: Goals of care include to maximize quality of life and symptom management.  [name] explained that patient will not be getting chemo or radiation because his body cannot handle it.  Patient and Perry Waller are interested in hospice service in the near future; visit scheduled for 01/07/2022.  Visit consisted of counseling and education dealing with the complex and emotionally intense issues of symptom management and palliative care in the setting of serious and potentially life-threatening illness. Palliative care team will continue to support patient, patient's family, and medical team.  3. Symptom management/plan:  Lung mass: No plans for  chemotherapy or radiation. Hospice service is considered by patient/family. Meeting to finalize decision 01/07/22. Continue Oxygen supplementation, Albuterol, Stiolto. Smoking cessation reitrated.  Dysphagia: Dysphagia related to malignant neoplasm supraglottis.  Chronic difficulty swallowing.  Continue Dexamethasone oral solution.  Aspiration precautions discussed.  Continue mechanical soft diet. Continue Ensure. Palliative will continue to monitor for symptom management/decline and make recommendations as needed. Return in a week. Encouraged to call provider sooner with any concerns.   HISTORY OF PRESENT ILLNESS:  Perry Waller is a 61 y.o. year old male with multiple medical problems including malignant neoplasm of supraglottis and recently diagnosed Lung Mass; sister reports it is a stage 4. No plans for chemotherapy and radiation. Supportive care only at this time. Patient denies pain/discomfort, endorses shortness of breath with exertion but none at rest. Patient with hx of COPD, arthritis. Palliative Care was asked by Dr. Eppie Gibson to help address goals of care and complex care decision. Rest of 10 point ROS asked and negative.  CODE STATUS: DNR  PPS:  40%  HOSPICE ELIGIBILITY/DIAGNOSIS: TBD  PAST MEDICAL HISTORY:  Past Medical History:  Diagnosis Date   Acute respiratory acidosis ([place]) 06/29/2019   Acute respiratory failure with hypoxia (HCC) 06/30/2019   Arthritis    Chronic anemia    baseline hgb 10-12   COPD (chronic obstructive pulmonary disease) ([place])    History of radiation therapy 08/17/19- 10/10/19   Larynx treated with 35 fractions of 2 Gy each to total 70 Gy.    Neck mass 08/05/2019    SOCIAL HX: Patient lives at home with his sister Perry Waller   ALLERGIES:  Allergies  Allergen Reactions   Fish Allergy Other (See Comments)    Breaks out  PERTINENT MEDICATIONS:  Outpatient Encounter Medications as of 01/01/2022  Medication Sig   acetaminophen (TYLENOL) 500 MG tablet  Take 500 mg by mouth every 6 (six) hours as needed for moderate pain or headache.   ciprofloxacin (CIPRO) 500 MG tablet Take 1 tablet (500 mg total) by mouth 2 (two) times daily.   feeding supplement, ENSURE COMPLETE, (ENSURE COMPLETE) LIQD Take 237 mLs by mouth 3 (three) times daily between meals. (Patient not taking: Reported on 12/11/2021)   gabapentin (NEURONTIN) 300 MG capsule Take 2 capsules (600 mg total) by mouth 2 (two) times daily.   Glycopyrrolate 1 MG/5ML SOLN Take 5 mLs (1 mg total) by mouth 3 (three) times daily as needed.   guaiFENesin-dextromethorphan (ROBITUSSIN DM) 100-10 MG/5ML syrup Take 5 mLs by mouth every 4 (four) hours as needed for cough.   ipratropium-albuterol (DUONEB) 0.5-2.5 (3) MG/3ML SOLN Take 3 mLs by nebulization in the morning, at noon, and at bedtime.   lidocaine (LIDODERM) 5 % Place 1 patch onto the skin daily. Remove & Discard patch within 12 hours or as directed by MD   metoprolol tartrate (LOPRESSOR) 25 MG tablet Take 0.5 tablets (12.5 mg total) by mouth 2 (two) times daily. For tachycardia   midodrine (PROAMATINE) 10 MG tablet Take 1 tablet (10 mg total) by mouth 3 (three) times daily with meals.   mirtazapine (REMERON) 15 MG tablet Take 1 tablet (15 mg total) by mouth at bedtime.   Misc. Devices MISC Ensure complete 237 ml 90 cans/@ 3 cans per day Need for supplement, need for weight gain due to diagnosis of protein caloric malnutrition, squamous cell carcinoma of the larynx method of feeding : PO   Misc. Devices MISC 1 each by Does not apply route daily. Camwalker for R LE   pantoprazole (PROTONIX) 40 MG tablet Take 1 tablet (40 mg total) by mouth daily.   polyethylene glycol powder (GLYCOLAX/MIRALAX) 17 GM/SCOOP powder Take 17 g by mouth 2 (two) times daily as needed for mild constipation.   Tiotropium Bromide-Olodaterol (STIOLTO RESPIMAT) 2.5-2.5 MCG/ACT AERS Inhale 2 puffs into the lungs daily.   No facility-administered encounter medications on file  as of 01/01/2022.   I spent 30 minutes providing this consultation; time includes spent with patient/family, chart review and documentation. More than 50% of the time in this consultation was spent on care coordination  Palliative Care was asked to follow this patient by consultation request of Dr. Eppie Gibson to help address advance care planning and complex decision making. Thank you for the opportunity to participate in the care of Quintin Hjort Please call our office at [phone] if we can be of additional assistance.  Note: Portions of this note were generated with [employer]. Dictation errors may occur despite best attempts at proofreading.  Teodoro Spray, NP

## 2022-01-01 NOTE — Telephone Encounter (Signed)
Pt's sister was called and a VM was left informing her that FMLA paperwork is ready for pick up.

## 2022-01-06 DIAGNOSIS — N178 Other acute kidney failure: Secondary | ICD-10-CM | POA: Diagnosis not present

## 2022-01-06 DIAGNOSIS — N390 Urinary tract infection, site not specified: Secondary | ICD-10-CM | POA: Diagnosis not present

## 2022-01-06 DIAGNOSIS — E860 Dehydration: Secondary | ICD-10-CM | POA: Diagnosis not present

## 2022-01-06 DIAGNOSIS — Z720 Tobacco use: Secondary | ICD-10-CM | POA: Diagnosis not present

## 2022-01-06 DIAGNOSIS — Q321 Other congenital malformations of trachea: Secondary | ICD-10-CM | POA: Diagnosis not present

## 2022-01-06 DIAGNOSIS — J449 Chronic obstructive pulmonary disease, unspecified: Secondary | ICD-10-CM | POA: Diagnosis not present

## 2022-01-06 DIAGNOSIS — J398 Other specified diseases of upper respiratory tract: Secondary | ICD-10-CM | POA: Diagnosis not present

## 2022-01-06 DIAGNOSIS — C322 Malignant neoplasm of subglottis: Secondary | ICD-10-CM | POA: Diagnosis not present

## 2022-01-06 DIAGNOSIS — D649 Anemia, unspecified: Secondary | ICD-10-CM | POA: Diagnosis not present

## 2022-01-06 DIAGNOSIS — E43 Unspecified severe protein-calorie malnutrition: Secondary | ICD-10-CM | POA: Diagnosis not present

## 2022-01-06 DIAGNOSIS — J9610 Chronic respiratory failure, unspecified whether with hypoxia or hypercapnia: Secondary | ICD-10-CM | POA: Diagnosis not present

## 2022-01-07 ENCOUNTER — Emergency Department (HOSPITAL_COMMUNITY)
Admission: EM | Admit: 2022-01-07 | Discharge: 2022-01-08 | Disposition: A | Discharge status: Home / Self Care | Payer: Medicaid Other | Attending: Emergency Medicine | Admitting: Emergency Medicine

## 2022-01-07 ENCOUNTER — Other Ambulatory Visit: Payer: Medicaid Other | Admitting: Hospice

## 2022-01-07 ENCOUNTER — Emergency Department (HOSPITAL_COMMUNITY): Payer: Medicaid Other

## 2022-01-07 ENCOUNTER — Other Ambulatory Visit: Payer: Self-pay

## 2022-01-07 DIAGNOSIS — Z8521 Personal history of malignant neoplasm of larynx: Secondary | ICD-10-CM | POA: Insufficient documentation

## 2022-01-07 DIAGNOSIS — C321 Malignant neoplasm of supraglottis: Secondary | ICD-10-CM

## 2022-01-07 DIAGNOSIS — R1310 Dysphagia, unspecified: Secondary | ICD-10-CM

## 2022-01-07 DIAGNOSIS — Z515 Encounter for palliative care: Secondary | ICD-10-CM

## 2022-01-07 DIAGNOSIS — J449 Chronic obstructive pulmonary disease, unspecified: Secondary | ICD-10-CM | POA: Diagnosis not present

## 2022-01-07 DIAGNOSIS — Z79899 Other long term (current) drug therapy: Secondary | ICD-10-CM | POA: Diagnosis not present

## 2022-01-07 DIAGNOSIS — D649 Anemia, unspecified: Secondary | ICD-10-CM | POA: Diagnosis not present

## 2022-01-07 DIAGNOSIS — R0602 Shortness of breath: Secondary | ICD-10-CM | POA: Diagnosis not present

## 2022-01-07 DIAGNOSIS — D75839 Thrombocytosis, unspecified: Secondary | ICD-10-CM | POA: Insufficient documentation

## 2022-01-07 DIAGNOSIS — C799 Secondary malignant neoplasm of unspecified site: Secondary | ICD-10-CM | POA: Insufficient documentation

## 2022-01-07 DIAGNOSIS — D509 Iron deficiency anemia, unspecified: Secondary | ICD-10-CM | POA: Diagnosis not present

## 2022-01-07 DIAGNOSIS — R000 Tachycardia, unspecified: Secondary | ICD-10-CM | POA: Diagnosis not present

## 2022-01-07 DIAGNOSIS — R531 Weakness: Secondary | ICD-10-CM

## 2022-01-07 DIAGNOSIS — D75838 Other thrombocytosis: Secondary | ICD-10-CM | POA: Diagnosis not present

## 2022-01-07 DIAGNOSIS — I10 Essential (primary) hypertension: Secondary | ICD-10-CM | POA: Insufficient documentation

## 2022-01-07 DIAGNOSIS — R918 Other nonspecific abnormal finding of lung field: Secondary | ICD-10-CM | POA: Diagnosis not present

## 2022-01-07 DIAGNOSIS — R911 Solitary pulmonary nodule: Secondary | ICD-10-CM | POA: Diagnosis not present

## 2022-01-07 DIAGNOSIS — R0902 Hypoxemia: Secondary | ICD-10-CM | POA: Diagnosis not present

## 2022-01-07 DIAGNOSIS — C329 Malignant neoplasm of larynx, unspecified: Secondary | ICD-10-CM | POA: Diagnosis not present

## 2022-01-07 LAB — CBC WITH DIFFERENTIAL/PLATELET
Abs Immature Granulocytes: 0.05 10*3/uL (ref 0.00–0.07)
Basophils Absolute: 0.1 10*3/uL (ref 0.0–0.1)
Basophils Relative: 1 %
Eosinophils Absolute: 0.2 10*3/uL (ref 0.0–0.5)
Eosinophils Relative: 1 %
HCT: 26.1 % — ABNORMAL LOW (ref 39.0–52.0)
Hemoglobin: 7.3 g/dL — ABNORMAL LOW (ref 13.0–17.0)
Immature Granulocytes: 1 %
Lymphocytes Relative: 12 %
Lymphs Abs: 1.2 10*3/uL (ref 0.7–4.0)
MCH: 23.5 pg — ABNORMAL LOW (ref 26.0–34.0)
MCHC: 28 g/dL — ABNORMAL LOW (ref 30.0–36.0)
MCV: 83.9 fL (ref 80.0–100.0)
Monocytes Absolute: 0.7 10*3/uL (ref 0.1–1.0)
Monocytes Relative: 7 %
Neutro Abs: 8.3 10*3/uL — ABNORMAL HIGH (ref 1.7–7.7)
Neutrophils Relative %: 78 %
Platelets: 512 10*3/uL — ABNORMAL HIGH (ref 150–400)
RBC: 3.11 MIL/uL — ABNORMAL LOW (ref 4.22–5.81)
RDW: 17.7 % — ABNORMAL HIGH (ref 11.5–15.5)
WBC: 10.6 10*3/uL — ABNORMAL HIGH (ref 4.0–10.5)
nRBC: 0 % (ref 0.0–0.2)

## 2022-01-07 LAB — URINALYSIS, ROUTINE W REFLEX MICROSCOPIC
Bilirubin Urine: NEGATIVE
Glucose, UA: NEGATIVE mg/dL
Hgb urine dipstick: NEGATIVE
Ketones, ur: NEGATIVE mg/dL
Leukocytes,Ua: NEGATIVE
Nitrite: NEGATIVE
Protein, ur: NEGATIVE mg/dL
Specific Gravity, Urine: 1.023 (ref 1.005–1.030)
pH: 6 (ref 5.0–8.0)

## 2022-01-07 MED ORDER — LACTATED RINGERS IV BOLUS
1000.0000 mL | Freq: Once | INTRAVENOUS | Status: AC
  Administered 2022-01-07: 1000 mL via INTRAVENOUS

## 2022-01-07 NOTE — ED Provider Notes (Signed)
[facility] DEPT Provider Note   CSN: [id number] Arrival date & time: 01/07/22  2233     History  Chief Complaint  Patient presents with   Shortness of Breath   Weakness    Perry Waller is a 61 y.o. male.  The history is provided by the patient.  Shortness of Breath Weakness Associated symptoms: shortness of breath   He has history of hypertension COPD, laryngeal cancer, tracheostomy and comes in complaining of weakness that started about 4 hours ago.  He denies fever or chills but has had sweats.  He denies cough.  Denies nausea or vomiting.  He admits to having decreased oral intake.   Home Medications Prior to Admission medications   Medication Sig Start Date End Date Taking? Authorizing Provider  acetaminophen (TYLENOL) 500 MG tablet Take 500 mg by mouth every 6 (six) hours as needed for moderate pain or headache.    [provider]  ciprofloxacin (CIPRO) 500 MG tablet Take 1 tablet (500 mg total) by mouth 2 (two) times daily. 12/19/21   Parrett, Fonnie Mu, NP  feeding supplement, ENSURE COMPLETE, (ENSURE COMPLETE) LIQD Take 237 mLs by mouth 3 (three) times daily between meals. Patient not taking: Reported on 12/11/2021 11/07/21 12/11/21  Argentina Donovan, PA-C  gabapentin (NEURONTIN) 300 MG capsule Take 2 capsules (600 mg total) by mouth 2 (two) times daily. 12/25/21   Charlott Rakes, MD  Glycopyrrolate 1 MG/5ML SOLN Take 5 mLs (1 mg total) by mouth 3 (three) times daily as needed. 09/30/21   Charlott Rakes, MD  guaiFENesin-dextromethorphan (ROBITUSSIN DM) 100-10 MG/5ML syrup Take 5 mLs by mouth every 4 (four) hours as needed for cough. 11/19/21   Martyn Ehrich, NP  ipratropium-albuterol (DUONEB) 0.5-2.5 (3) MG/3ML SOLN Take 3 mLs by nebulization in the morning, at noon, and at bedtime. 09/30/21   Charlott Rakes, MD  lidocaine (LIDODERM) 5 % Place 1 patch onto the skin daily. Remove & Discard patch within 12 hours or as directed by MD  12/25/21   Charlott Rakes, MD  metoprolol tartrate (LOPRESSOR) 25 MG tablet Take 0.5 tablets (12.5 mg total) by mouth 2 (two) times daily. For tachycardia 09/30/21 12/11/21  Charlott Rakes, MD  midodrine (PROAMATINE) 10 MG tablet Take 1 tablet (10 mg total) by mouth 3 (three) times daily with meals. 10/14/21   Mercy Riding, MD  mirtazapine (REMERON) 15 MG tablet Take 1 tablet (15 mg total) by mouth at bedtime. 09/30/21   Charlott Rakes, MD  Misc. Devices MISC Ensure complete 237 ml 90 cans/@ 3 cans per day Need for supplement, need for weight gain due to diagnosis of protein caloric malnutrition, squamous cell carcinoma of the larynx method of feeding : PO 11/04/21   Charlott Rakes, MD  Misc. Devices MISC 1 each by Does not apply route daily. Camwalker for R LE 11/07/21   Freeman Caldron M, PA-C  pantoprazole (PROTONIX) 40 MG tablet Take 1 tablet (40 mg total) by mouth daily. 10/14/21   Mercy Riding, MD  polyethylene glycol powder (GLYCOLAX/MIRALAX) 17 GM/SCOOP powder Take 17 g by mouth 2 (two) times daily as needed for mild constipation. 10/14/21   Mercy Riding, MD  Tiotropium Bromide-Olodaterol (STIOLTO RESPIMAT) 2.5-2.5 MCG/ACT AERS Inhale 2 puffs into the lungs daily. 11/19/21   Martyn Ehrich, NP      Allergies    Fish allergy    Review of Systems   Review of Systems  Respiratory:  Positive for shortness of breath.  Neurological:  Positive for weakness.  All other systems reviewed and are negative.  Physical Exam Updated Vital Signs BP 104/71 (BP Location: Right Arm)    Pulse (!) 101    Temp 97.7 F (36.5 C) (Oral)    Resp 19    SpO2 96%  Physical Exam Vitals and nursing note reviewed.  Cachectic and chronically ill-appearing 61 year old male, resting comfortably and in no acute distress. Vital signs are significant for borderline elevated heart rate. Oxygen saturation is 96%, which is normal. Head is normocephalic and atraumatic. PERRLA, EOMI. Oropharynx is clear.  Mucous  membranes are dry. Neck is nontender and supple without adenopathy or JVD.  Tracheostomy in place. Back is nontender and there is no CVA tenderness. Lungs are clear without rales, wheezes, or rhonchi. Chest is nontender. Heart has regular rate and rhythm without murmur. Abdomen is soft, flat, nontender. Extremities have no cyanosis or edema, full range of motion is present. Skin is warm and dry without rash. Neurologic: Mental status is normal, cranial nerves are intact, moves all extremities equally.  ED Results / Procedures / Treatments   Labs (all labs ordered are listed, but only abnormal results are displayed) Labs Reviewed  COMPREHENSIVE METABOLIC PANEL - Abnormal; Notable for the following components:      Result Value   Glucose, Bld 141 (*)    BUN 22 (*)    Creatinine, Ser 0.60 (*)    Albumin 2.7 (*)    All other components within normal limits  CBC WITH DIFFERENTIAL/PLATELET - Abnormal; Notable for the following components:   WBC 10.6 (*)    RBC 3.11 (*)    Hemoglobin 7.3 (*)    HCT 26.1 (*)    MCH 23.5 (*)    MCHC 28.0 (*)    RDW 17.7 (*)    Platelets 512 (*)    Neutro Abs 8.3 (*)    All other components within normal limits  URINALYSIS, ROUTINE W REFLEX MICROSCOPIC    Radiology DG Chest Port 1 View  Result Date: 01/07/2022 CLINICAL DATA:  Weakness with shortness of breath. EXAM: PORTABLE CHEST 1 VIEW COMPARISON:  CT angiogram chest 11/14/2021. Chest x-ray 11/11/2021. PET-CT 12/18/2021. FINDINGS: The tip of the tracheostomy is superior to the level of the clavicular heads, unchanged from the prior examination. Right upper lobe dense consolidation with cavitary component in the right lung apex appears similar to the prior examination. There is patchy right mid and lower lung airspace disease, increasing in the right lung base. There is an enlarging rounded density in the left lateral lung measuring 3.2 cm. Left apical bulla again noted. Cardiomediastinal silhouette  within normal limits. No acute fractures. IMPRESSION: 1. Rounded left mid lung nodule appears slightly larger compared to the prior study given differences in technique. 2. Increasing right lung airspace disease. 3. Grossly unchanged right upper lobe mass with cavitary component in the right lung apex. Electronically Signed   By: Ronney Asters M.D.   On: 01/07/2022 23:29    Procedures Procedures    Medications Ordered in ED Medications  lactated ringers bolus 1,000 mL (1,000 mLs Intravenous New Bag/Given 01/07/22 2337)   ED Course/ Medical Decision Making/ A&P                           Medical Decision Making  Weakness with physical exam suggesting dehydration.  He is chronically ill-appearing.  Old records are reviewed confirming history of laryngeal cancer and DNR  status and currently being managed by palliative care.  We will check a chest x-ray to rule out pneumonia, check urinalysis to rule out UTI.  We will check screening labs and give IV fluids.  Urinalysis shows no evidence of infection.  Chest x-ray is read by radiologist as showing increased airspace disease in the right.  I reviewed the images and did not feel that it represents pneumonia.  In addition, patient is not febrile or hypoxic or tachycardic.  Hemoglobin is noted to have dropped from 8.9 on 12/19 to 7.3 today.  I cannot see record of prior blood transfusions, and hemoglobin has been as low as 7.7 in the past.  Other labs are also significant for thrombocytosis which is chronic.  Patient's caregiver and power of attorney has arrived and states that she was having difficulty with suctioning his tracheostomy at home.  He has not had no problems with secretions in the emergency department and continues to maintain good oxygen saturations.  I discussed with the patient and caregiver whether to be admitted to the hospital for consideration of blood transfusion versus going home and continuing outpatient care and consideration for  elective blood transfusion as an outpatient.  With shared decision making, decision was made to go home.  He is to follow-up with his oncologist to decide whether or not to have an elective blood transfusion.  He has also been referred to hospice who can work with any home needs.  Return precautions are discussed.        Final Clinical Impression(s) / ED Diagnoses Final diagnoses:  Weakness  Microcytic anemia  Thrombocytosis  Metastatic malignant neoplasm, unspecified site [facility])    Rx / DC Orders ED Discharge Orders     None         Delora Fuel, MD 49/17/91 0150

## 2022-01-07 NOTE — Progress Notes (Signed)
[facility] Consult Note Telephone: [phone]  Fax: [phone]  PATIENT NAME: Perry Waller DOB: May 16, 1961 MRN: [id number]    REFERRING PROVIDER:Dr. Eppie Gibson Responsible Party: Self/Sister -Blain Pais Emergency Contact: sisterLajoyce Corners Henrrietta [phone]    Visit is to build trust and highlight Palliative Medicine as specialized medical care for people living with serious illness, aimed at facilitating better quality of life through symptoms relief, assisting with advance care plan and establishing goals of care.    CHIEF COMPLAINT: Follow up visit  RECOMMENDATIONS/PLAN:   Advance Care Planning/Code Status: Code status reviewed.  Patient is a DO NOT RESUSCITATE. 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  Goals of Care:  Comfort care.   Visit consisted of counseling and education dealing with the complex and emotionally intense issues of symptom management and palliative care in the setting of serious and potentially life-threatening illness. Palliative care team will continue to support patient, patient's family, and medical team. I spent 46 minutes providing this consultation; time includes spent with patient/family, chart review and documentation and collaborating with PCP and ACC CMO to secure hospice eligibility approval.  More than 50% of the time in this consultation was spent on care coordination. 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 Patient with associated weight loss/ cachexia, current weight 87 Ibs, BMI 14.04, PPS 40%.Significant weight loss and decline in  functional status. Hospice service is requested by patient/family.  NP collaborated with PCP and [facility] CMO on hospice eligibility. Hospice referral sent in accordingly.  Dysphagia: Dysphagia related to malignant neoplasm supraglottis.  Chronic difficulty swallowing.  Continue Dexamethasone oral solution.  Aspiration precautions discussed.  Continue mechanical soft diet. Continue Ensure. Palliative will continue to monitor for symptom management/decline and make recommendations as needed. Return in a week. Encouraged to call provider sooner with any concerns.   HISTORY OF PRESENT ILLNESS:  Perry Waller is a 61 y.o. year old male with multiple medical problems including malignant neoplasm of supraglottis and recently diagnosed Lung Mass; sister reports it is a stage 4. No plans for chemotherapy and radiation. Referral for hospice service initiated. Patient with hx of COPD, arthritis. Palliative Care was asked by Dr. Eppie Gibson to help address goals of care and complex care decision. Rest of 10 point ROS asked and negative.  CODE STATUS: DNR  PPS:  40%  HOSPICE ELIGIBILITY/DIAGNOSIS: TBD  PAST MEDICAL HISTORY:  Past Medical History:  Diagnosis Date   Acute respiratory acidosis ([place]) 06/29/2019   Acute respiratory failure with hypoxia ([facility]) 06/30/2019   Arthritis    Chronic anemia    baseline hgb 10-12   COPD (chronic obstructive pulmonary disease) ([place])    History of radiation therapy 08/17/19- 10/10/19   Larynx treated with 35 fractions of 2 Gy each to total 70 Gy.    Neck mass 08/05/2019    SOCIAL HX: Patient lives at home with his sister Blain Pais   ALLERGIES:  Allergies  Allergen Reactions   Fish Allergy Other (See Comments)    Breaks out      Palliative Care was asked to follow this patient by consultation request of Dr. Eppie Gibson to help address advance care planning and complex decision making. Thank you for the opportunity to participate in the care of Perry Waller Please call our  office at [phone] if we can be of additional assistance.  Note: Portions of this note were generated with Lobby[employer]. Dictation errors may occur despite best attempts at proofreading.  Teodoro Spray, NP

## 2022-01-07 NOTE — ED Triage Notes (Signed)
A&Ox4 ambulatory 2 assist. Patient is a stage 4 lung cancer patient. SOB /generalized weakness. Patient feels like his trach is tight and " it just doesn't feel right". Family did a lot of suctioning more  than usual. From home with family.   Bp 100/72- 116- 97% RA  RR 24 CBG: 131

## 2022-01-07 NOTE — ED Notes (Signed)
Patient was suctioned. Thick yellow secretion expelled.

## 2022-01-08 DIAGNOSIS — C787 Secondary malignant neoplasm of liver and intrahepatic bile duct: Secondary | ICD-10-CM | POA: Diagnosis not present

## 2022-01-08 DIAGNOSIS — D630 Anemia in neoplastic disease: Secondary | ICD-10-CM | POA: Diagnosis not present

## 2022-01-08 DIAGNOSIS — J440 Chronic obstructive pulmonary disease with acute lower respiratory infection: Secondary | ICD-10-CM | POA: Diagnosis not present

## 2022-01-08 DIAGNOSIS — R1310 Dysphagia, unspecified: Secondary | ICD-10-CM | POA: Diagnosis not present

## 2022-01-08 DIAGNOSIS — E43 Unspecified severe protein-calorie malnutrition: Secondary | ICD-10-CM | POA: Diagnosis not present

## 2022-01-08 DIAGNOSIS — I951 Orthostatic hypotension: Secondary | ICD-10-CM | POA: Diagnosis not present

## 2022-01-08 DIAGNOSIS — C7951 Secondary malignant neoplasm of bone: Secondary | ICD-10-CM | POA: Diagnosis not present

## 2022-01-08 DIAGNOSIS — I10 Essential (primary) hypertension: Secondary | ICD-10-CM | POA: Diagnosis not present

## 2022-01-08 DIAGNOSIS — C329 Malignant neoplasm of larynx, unspecified: Secondary | ICD-10-CM | POA: Diagnosis not present

## 2022-01-08 DIAGNOSIS — J159 Unspecified bacterial pneumonia: Secondary | ICD-10-CM | POA: Diagnosis not present

## 2022-01-08 DIAGNOSIS — J9691 Respiratory failure, unspecified with hypoxia: Secondary | ICD-10-CM | POA: Diagnosis not present

## 2022-01-08 DIAGNOSIS — K219 Gastro-esophageal reflux disease without esophagitis: Secondary | ICD-10-CM | POA: Diagnosis not present

## 2022-01-08 LAB — COMPREHENSIVE METABOLIC PANEL
ALT: 9 U/L (ref 0–44)
AST: 24 U/L (ref 15–41)
Albumin: 2.7 g/dL — ABNORMAL LOW (ref 3.5–5.0)
Alkaline Phosphatase: 95 U/L (ref 38–126)
Anion gap: 10 (ref 5–15)
BUN: 22 mg/dL — ABNORMAL HIGH (ref 6–20)
CO2: 25 mmol/L (ref 22–32)
Calcium: 8.9 mg/dL (ref 8.9–10.3)
Chloride: 102 mmol/L (ref 98–111)
Creatinine, Ser: 0.6 mg/dL — ABNORMAL LOW (ref 0.61–1.24)
GFR, Estimated: >60 mL/min (ref >60)
Glucose, Bld: 141 mg/dL — ABNORMAL HIGH (ref 70–99)
Potassium: 4.4 mmol/L (ref 3.5–5.1)
Sodium: 137 mmol/L (ref 135–145)
Total Bilirubin: 0.5 mg/dL (ref 0.3–1.2)
Total Protein: 7.9 g/dL (ref 6.5–8.1)

## 2022-01-08 NOTE — Telephone Encounter (Signed)
[facility] Lemm[facility] called back to report that they are still missing signed orders  Edwinna Areola Best contact: [phone]

## 2022-01-08 NOTE — Telephone Encounter (Signed)
All recent orders that has been received has bee re faxed again to [phone]

## 2022-01-08 NOTE — Discharge Instructions (Signed)
Continue with current treatment plan.  Please discuss with your oncologist whether or not to proceed with a blood transfusion.  Return if you develop a fever or difficulty breathing.

## 2022-01-09 DIAGNOSIS — D630 Anemia in neoplastic disease: Secondary | ICD-10-CM | POA: Diagnosis not present

## 2022-01-09 DIAGNOSIS — C7951 Secondary malignant neoplasm of bone: Secondary | ICD-10-CM | POA: Diagnosis not present

## 2022-01-09 DIAGNOSIS — J9691 Respiratory failure, unspecified with hypoxia: Secondary | ICD-10-CM | POA: Diagnosis not present

## 2022-01-09 DIAGNOSIS — R1310 Dysphagia, unspecified: Secondary | ICD-10-CM | POA: Diagnosis not present

## 2022-01-09 DIAGNOSIS — I10 Essential (primary) hypertension: Secondary | ICD-10-CM | POA: Diagnosis not present

## 2022-01-09 DIAGNOSIS — J159 Unspecified bacterial pneumonia: Secondary | ICD-10-CM | POA: Diagnosis not present

## 2022-01-09 DIAGNOSIS — E43 Unspecified severe protein-calorie malnutrition: Secondary | ICD-10-CM | POA: Diagnosis not present

## 2022-01-09 DIAGNOSIS — C329 Malignant neoplasm of larynx, unspecified: Secondary | ICD-10-CM | POA: Diagnosis not present

## 2022-01-09 DIAGNOSIS — J440 Chronic obstructive pulmonary disease with acute lower respiratory infection: Secondary | ICD-10-CM | POA: Diagnosis not present

## 2022-01-09 DIAGNOSIS — I951 Orthostatic hypotension: Secondary | ICD-10-CM | POA: Diagnosis not present

## 2022-01-09 DIAGNOSIS — C787 Secondary malignant neoplasm of liver and intrahepatic bile duct: Secondary | ICD-10-CM | POA: Diagnosis not present

## 2022-01-09 DIAGNOSIS — K219 Gastro-esophageal reflux disease without esophagitis: Secondary | ICD-10-CM | POA: Diagnosis not present

## 2022-01-10 DIAGNOSIS — I10 Essential (primary) hypertension: Secondary | ICD-10-CM | POA: Diagnosis not present

## 2022-01-10 DIAGNOSIS — D630 Anemia in neoplastic disease: Secondary | ICD-10-CM | POA: Diagnosis not present

## 2022-01-10 DIAGNOSIS — C787 Secondary malignant neoplasm of liver and intrahepatic bile duct: Secondary | ICD-10-CM | POA: Diagnosis not present

## 2022-01-10 DIAGNOSIS — J159 Unspecified bacterial pneumonia: Secondary | ICD-10-CM | POA: Diagnosis not present

## 2022-01-10 DIAGNOSIS — I951 Orthostatic hypotension: Secondary | ICD-10-CM | POA: Diagnosis not present

## 2022-01-10 DIAGNOSIS — E43 Unspecified severe protein-calorie malnutrition: Secondary | ICD-10-CM | POA: Diagnosis not present

## 2022-01-10 DIAGNOSIS — R1310 Dysphagia, unspecified: Secondary | ICD-10-CM | POA: Diagnosis not present

## 2022-01-10 DIAGNOSIS — J440 Chronic obstructive pulmonary disease with acute lower respiratory infection: Secondary | ICD-10-CM | POA: Diagnosis not present

## 2022-01-10 DIAGNOSIS — C7951 Secondary malignant neoplasm of bone: Secondary | ICD-10-CM | POA: Diagnosis not present

## 2022-01-10 DIAGNOSIS — J9691 Respiratory failure, unspecified with hypoxia: Secondary | ICD-10-CM | POA: Diagnosis not present

## 2022-01-10 DIAGNOSIS — K219 Gastro-esophageal reflux disease without esophagitis: Secondary | ICD-10-CM | POA: Diagnosis not present

## 2022-01-10 DIAGNOSIS — C329 Malignant neoplasm of larynx, unspecified: Secondary | ICD-10-CM | POA: Diagnosis not present

## 2022-01-11 DIAGNOSIS — I951 Orthostatic hypotension: Secondary | ICD-10-CM | POA: Diagnosis not present

## 2022-01-11 DIAGNOSIS — E43 Unspecified severe protein-calorie malnutrition: Secondary | ICD-10-CM | POA: Diagnosis not present

## 2022-01-11 DIAGNOSIS — C787 Secondary malignant neoplasm of liver and intrahepatic bile duct: Secondary | ICD-10-CM | POA: Diagnosis not present

## 2022-01-11 DIAGNOSIS — I10 Essential (primary) hypertension: Secondary | ICD-10-CM | POA: Diagnosis not present

## 2022-01-11 DIAGNOSIS — C7951 Secondary malignant neoplasm of bone: Secondary | ICD-10-CM | POA: Diagnosis not present

## 2022-01-11 DIAGNOSIS — K219 Gastro-esophageal reflux disease without esophagitis: Secondary | ICD-10-CM | POA: Diagnosis not present

## 2022-01-11 DIAGNOSIS — D630 Anemia in neoplastic disease: Secondary | ICD-10-CM | POA: Diagnosis not present

## 2022-01-11 DIAGNOSIS — J440 Chronic obstructive pulmonary disease with acute lower respiratory infection: Secondary | ICD-10-CM | POA: Diagnosis not present

## 2022-01-11 DIAGNOSIS — C329 Malignant neoplasm of larynx, unspecified: Secondary | ICD-10-CM | POA: Diagnosis not present

## 2022-01-11 DIAGNOSIS — R1310 Dysphagia, unspecified: Secondary | ICD-10-CM | POA: Diagnosis not present

## 2022-01-11 DIAGNOSIS — J9691 Respiratory failure, unspecified with hypoxia: Secondary | ICD-10-CM | POA: Diagnosis not present

## 2022-01-11 DIAGNOSIS — J159 Unspecified bacterial pneumonia: Secondary | ICD-10-CM | POA: Diagnosis not present

## 2022-01-12 DIAGNOSIS — C329 Malignant neoplasm of larynx, unspecified: Secondary | ICD-10-CM | POA: Diagnosis not present

## 2022-01-12 DIAGNOSIS — I10 Essential (primary) hypertension: Secondary | ICD-10-CM | POA: Diagnosis not present

## 2022-01-12 DIAGNOSIS — D630 Anemia in neoplastic disease: Secondary | ICD-10-CM | POA: Diagnosis not present

## 2022-01-12 DIAGNOSIS — C787 Secondary malignant neoplasm of liver and intrahepatic bile duct: Secondary | ICD-10-CM | POA: Diagnosis not present

## 2022-01-12 DIAGNOSIS — J159 Unspecified bacterial pneumonia: Secondary | ICD-10-CM | POA: Diagnosis not present

## 2022-01-12 DIAGNOSIS — R1310 Dysphagia, unspecified: Secondary | ICD-10-CM | POA: Diagnosis not present

## 2022-01-12 DIAGNOSIS — J9691 Respiratory failure, unspecified with hypoxia: Secondary | ICD-10-CM | POA: Diagnosis not present

## 2022-01-12 DIAGNOSIS — I951 Orthostatic hypotension: Secondary | ICD-10-CM | POA: Diagnosis not present

## 2022-01-12 DIAGNOSIS — E43 Unspecified severe protein-calorie malnutrition: Secondary | ICD-10-CM | POA: Diagnosis not present

## 2022-01-12 DIAGNOSIS — C7951 Secondary malignant neoplasm of bone: Secondary | ICD-10-CM | POA: Diagnosis not present

## 2022-01-12 DIAGNOSIS — K219 Gastro-esophageal reflux disease without esophagitis: Secondary | ICD-10-CM | POA: Diagnosis not present

## 2022-01-12 DIAGNOSIS — J440 Chronic obstructive pulmonary disease with acute lower respiratory infection: Secondary | ICD-10-CM | POA: Diagnosis not present

## 2022-01-13 ENCOUNTER — Other Ambulatory Visit: Payer: Self-pay | Admitting: Pulmonary Disease

## 2022-01-13 DIAGNOSIS — N178 Other acute kidney failure: Secondary | ICD-10-CM | POA: Diagnosis not present

## 2022-01-13 DIAGNOSIS — C329 Malignant neoplasm of larynx, unspecified: Secondary | ICD-10-CM | POA: Diagnosis not present

## 2022-01-13 DIAGNOSIS — I10 Essential (primary) hypertension: Secondary | ICD-10-CM | POA: Diagnosis not present

## 2022-01-13 DIAGNOSIS — J440 Chronic obstructive pulmonary disease with acute lower respiratory infection: Secondary | ICD-10-CM | POA: Diagnosis not present

## 2022-01-13 DIAGNOSIS — J9691 Respiratory failure, unspecified with hypoxia: Secondary | ICD-10-CM | POA: Diagnosis not present

## 2022-01-13 DIAGNOSIS — C787 Secondary malignant neoplasm of liver and intrahepatic bile duct: Secondary | ICD-10-CM | POA: Diagnosis not present

## 2022-01-13 DIAGNOSIS — Q321 Other congenital malformations of trachea: Secondary | ICD-10-CM | POA: Diagnosis not present

## 2022-01-13 DIAGNOSIS — D630 Anemia in neoplastic disease: Secondary | ICD-10-CM | POA: Diagnosis not present

## 2022-01-13 DIAGNOSIS — D649 Anemia, unspecified: Secondary | ICD-10-CM | POA: Diagnosis not present

## 2022-01-13 DIAGNOSIS — R1310 Dysphagia, unspecified: Secondary | ICD-10-CM | POA: Diagnosis not present

## 2022-01-13 DIAGNOSIS — E860 Dehydration: Secondary | ICD-10-CM | POA: Diagnosis not present

## 2022-01-13 DIAGNOSIS — C322 Malignant neoplasm of subglottis: Secondary | ICD-10-CM | POA: Diagnosis not present

## 2022-01-13 DIAGNOSIS — J398 Other specified diseases of upper respiratory tract: Secondary | ICD-10-CM | POA: Diagnosis not present

## 2022-01-13 DIAGNOSIS — J9610 Chronic respiratory failure, unspecified whether with hypoxia or hypercapnia: Secondary | ICD-10-CM | POA: Diagnosis not present

## 2022-01-13 DIAGNOSIS — N390 Urinary tract infection, site not specified: Secondary | ICD-10-CM | POA: Diagnosis not present

## 2022-01-13 DIAGNOSIS — I951 Orthostatic hypotension: Secondary | ICD-10-CM | POA: Diagnosis not present

## 2022-01-13 DIAGNOSIS — C7951 Secondary malignant neoplasm of bone: Secondary | ICD-10-CM | POA: Diagnosis not present

## 2022-01-13 DIAGNOSIS — E43 Unspecified severe protein-calorie malnutrition: Secondary | ICD-10-CM | POA: Diagnosis not present

## 2022-01-13 DIAGNOSIS — K219 Gastro-esophageal reflux disease without esophagitis: Secondary | ICD-10-CM | POA: Diagnosis not present

## 2022-01-13 DIAGNOSIS — J159 Unspecified bacterial pneumonia: Secondary | ICD-10-CM | POA: Diagnosis not present

## 2022-01-13 DIAGNOSIS — Z720 Tobacco use: Secondary | ICD-10-CM | POA: Diagnosis not present

## 2022-01-13 DIAGNOSIS — J449 Chronic obstructive pulmonary disease, unspecified: Secondary | ICD-10-CM | POA: Diagnosis not present

## 2022-01-13 LAB — SARS CORONAVIRUS 2 (TAT 6-24 HRS): SARS Coronavirus 2: NEGATIVE

## 2022-01-14 DIAGNOSIS — I951 Orthostatic hypotension: Secondary | ICD-10-CM | POA: Diagnosis not present

## 2022-01-14 DIAGNOSIS — Z930 Tracheostomy status: Secondary | ICD-10-CM | POA: Diagnosis not present

## 2022-01-14 DIAGNOSIS — R1310 Dysphagia, unspecified: Secondary | ICD-10-CM | POA: Diagnosis not present

## 2022-01-14 DIAGNOSIS — J440 Chronic obstructive pulmonary disease with acute lower respiratory infection: Secondary | ICD-10-CM | POA: Diagnosis not present

## 2022-01-14 DIAGNOSIS — I10 Essential (primary) hypertension: Secondary | ICD-10-CM | POA: Diagnosis not present

## 2022-01-14 DIAGNOSIS — C787 Secondary malignant neoplasm of liver and intrahepatic bile duct: Secondary | ICD-10-CM | POA: Diagnosis not present

## 2022-01-14 DIAGNOSIS — K219 Gastro-esophageal reflux disease without esophagitis: Secondary | ICD-10-CM | POA: Diagnosis not present

## 2022-01-14 DIAGNOSIS — C7951 Secondary malignant neoplasm of bone: Secondary | ICD-10-CM | POA: Diagnosis not present

## 2022-01-14 DIAGNOSIS — E43 Unspecified severe protein-calorie malnutrition: Secondary | ICD-10-CM | POA: Diagnosis not present

## 2022-01-14 DIAGNOSIS — C329 Malignant neoplasm of larynx, unspecified: Secondary | ICD-10-CM | POA: Diagnosis not present

## 2022-01-14 DIAGNOSIS — J9691 Respiratory failure, unspecified with hypoxia: Secondary | ICD-10-CM | POA: Diagnosis not present

## 2022-01-14 DIAGNOSIS — J159 Unspecified bacterial pneumonia: Secondary | ICD-10-CM | POA: Diagnosis not present

## 2022-01-14 DIAGNOSIS — D630 Anemia in neoplastic disease: Secondary | ICD-10-CM | POA: Diagnosis not present

## 2022-01-15 ENCOUNTER — Inpatient Hospital Stay (HOSPITAL_COMMUNITY)
Admission: RE | Admit: 2022-01-15 | Discharge: 2022-01-15 | Disposition: A | Discharge status: Home / Self Care | Payer: Medicaid Other | Source: Ambulatory Visit

## 2022-01-15 DIAGNOSIS — J159 Unspecified bacterial pneumonia: Secondary | ICD-10-CM | POA: Diagnosis not present

## 2022-01-15 DIAGNOSIS — R1310 Dysphagia, unspecified: Secondary | ICD-10-CM | POA: Diagnosis not present

## 2022-01-15 DIAGNOSIS — C329 Malignant neoplasm of larynx, unspecified: Secondary | ICD-10-CM | POA: Diagnosis not present

## 2022-01-15 DIAGNOSIS — J440 Chronic obstructive pulmonary disease with acute lower respiratory infection: Secondary | ICD-10-CM | POA: Diagnosis not present

## 2022-01-15 DIAGNOSIS — I951 Orthostatic hypotension: Secondary | ICD-10-CM | POA: Diagnosis not present

## 2022-01-15 DIAGNOSIS — D630 Anemia in neoplastic disease: Secondary | ICD-10-CM | POA: Diagnosis not present

## 2022-01-15 DIAGNOSIS — C787 Secondary malignant neoplasm of liver and intrahepatic bile duct: Secondary | ICD-10-CM | POA: Diagnosis not present

## 2022-01-15 DIAGNOSIS — C7951 Secondary malignant neoplasm of bone: Secondary | ICD-10-CM | POA: Diagnosis not present

## 2022-01-15 DIAGNOSIS — E43 Unspecified severe protein-calorie malnutrition: Secondary | ICD-10-CM | POA: Diagnosis not present

## 2022-01-15 DIAGNOSIS — K219 Gastro-esophageal reflux disease without esophagitis: Secondary | ICD-10-CM | POA: Diagnosis not present

## 2022-01-15 DIAGNOSIS — I10 Essential (primary) hypertension: Secondary | ICD-10-CM | POA: Diagnosis not present

## 2022-01-15 DIAGNOSIS — J9691 Respiratory failure, unspecified with hypoxia: Secondary | ICD-10-CM | POA: Diagnosis not present

## 2022-01-15 NOTE — Progress Notes (Incomplete)
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 I was him just before his last CT scan after being treated for cavitary PNA and he has since been dx'd by bronch, seen Onc (declined treatment) and is now on hospice. He is here today for routine/planned trach care.  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

## 2022-01-16 DIAGNOSIS — R1310 Dysphagia, unspecified: Secondary | ICD-10-CM | POA: Diagnosis not present

## 2022-01-16 DIAGNOSIS — D630 Anemia in neoplastic disease: Secondary | ICD-10-CM | POA: Diagnosis not present

## 2022-01-16 DIAGNOSIS — J440 Chronic obstructive pulmonary disease with acute lower respiratory infection: Secondary | ICD-10-CM | POA: Diagnosis not present

## 2022-01-16 DIAGNOSIS — I10 Essential (primary) hypertension: Secondary | ICD-10-CM | POA: Diagnosis not present

## 2022-01-16 DIAGNOSIS — J9691 Respiratory failure, unspecified with hypoxia: Secondary | ICD-10-CM | POA: Diagnosis not present

## 2022-01-16 DIAGNOSIS — C329 Malignant neoplasm of larynx, unspecified: Secondary | ICD-10-CM | POA: Diagnosis not present

## 2022-01-16 DIAGNOSIS — K219 Gastro-esophageal reflux disease without esophagitis: Secondary | ICD-10-CM | POA: Diagnosis not present

## 2022-01-16 DIAGNOSIS — J159 Unspecified bacterial pneumonia: Secondary | ICD-10-CM | POA: Diagnosis not present

## 2022-01-16 DIAGNOSIS — I951 Orthostatic hypotension: Secondary | ICD-10-CM | POA: Diagnosis not present

## 2022-01-16 DIAGNOSIS — C787 Secondary malignant neoplasm of liver and intrahepatic bile duct: Secondary | ICD-10-CM | POA: Diagnosis not present

## 2022-01-16 DIAGNOSIS — E43 Unspecified severe protein-calorie malnutrition: Secondary | ICD-10-CM | POA: Diagnosis not present

## 2022-01-16 DIAGNOSIS — C7951 Secondary malignant neoplasm of bone: Secondary | ICD-10-CM | POA: Diagnosis not present

## 2022-01-17 DIAGNOSIS — R1310 Dysphagia, unspecified: Secondary | ICD-10-CM | POA: Diagnosis not present

## 2022-01-17 DIAGNOSIS — C329 Malignant neoplasm of larynx, unspecified: Secondary | ICD-10-CM | POA: Diagnosis not present

## 2022-01-17 DIAGNOSIS — D630 Anemia in neoplastic disease: Secondary | ICD-10-CM | POA: Diagnosis not present

## 2022-01-17 DIAGNOSIS — J440 Chronic obstructive pulmonary disease with acute lower respiratory infection: Secondary | ICD-10-CM | POA: Diagnosis not present

## 2022-01-17 DIAGNOSIS — E43 Unspecified severe protein-calorie malnutrition: Secondary | ICD-10-CM | POA: Diagnosis not present

## 2022-01-17 DIAGNOSIS — J159 Unspecified bacterial pneumonia: Secondary | ICD-10-CM | POA: Diagnosis not present

## 2022-01-17 DIAGNOSIS — E46 Unspecified protein-calorie malnutrition: Secondary | ICD-10-CM | POA: Diagnosis not present

## 2022-01-17 DIAGNOSIS — I951 Orthostatic hypotension: Secondary | ICD-10-CM | POA: Diagnosis not present

## 2022-01-17 DIAGNOSIS — I10 Essential (primary) hypertension: Secondary | ICD-10-CM | POA: Diagnosis not present

## 2022-01-17 DIAGNOSIS — K219 Gastro-esophageal reflux disease without esophagitis: Secondary | ICD-10-CM | POA: Diagnosis not present

## 2022-01-17 DIAGNOSIS — J9691 Respiratory failure, unspecified with hypoxia: Secondary | ICD-10-CM | POA: Diagnosis not present

## 2022-01-17 DIAGNOSIS — C787 Secondary malignant neoplasm of liver and intrahepatic bile duct: Secondary | ICD-10-CM | POA: Diagnosis not present

## 2022-01-17 DIAGNOSIS — C7951 Secondary malignant neoplasm of bone: Secondary | ICD-10-CM | POA: Diagnosis not present

## 2022-01-17 DIAGNOSIS — Z930 Tracheostomy status: Secondary | ICD-10-CM | POA: Diagnosis not present

## 2022-01-18 DIAGNOSIS — J159 Unspecified bacterial pneumonia: Secondary | ICD-10-CM | POA: Diagnosis not present

## 2022-01-18 DIAGNOSIS — E43 Unspecified severe protein-calorie malnutrition: Secondary | ICD-10-CM | POA: Diagnosis not present

## 2022-01-18 DIAGNOSIS — J9691 Respiratory failure, unspecified with hypoxia: Secondary | ICD-10-CM | POA: Diagnosis not present

## 2022-01-18 DIAGNOSIS — C787 Secondary malignant neoplasm of liver and intrahepatic bile duct: Secondary | ICD-10-CM | POA: Diagnosis not present

## 2022-01-18 DIAGNOSIS — I951 Orthostatic hypotension: Secondary | ICD-10-CM | POA: Diagnosis not present

## 2022-01-18 DIAGNOSIS — C329 Malignant neoplasm of larynx, unspecified: Secondary | ICD-10-CM | POA: Diagnosis not present

## 2022-01-18 DIAGNOSIS — R1310 Dysphagia, unspecified: Secondary | ICD-10-CM | POA: Diagnosis not present

## 2022-01-18 DIAGNOSIS — I10 Essential (primary) hypertension: Secondary | ICD-10-CM | POA: Diagnosis not present

## 2022-01-18 DIAGNOSIS — J440 Chronic obstructive pulmonary disease with acute lower respiratory infection: Secondary | ICD-10-CM | POA: Diagnosis not present

## 2022-01-18 DIAGNOSIS — K219 Gastro-esophageal reflux disease without esophagitis: Secondary | ICD-10-CM | POA: Diagnosis not present

## 2022-01-18 DIAGNOSIS — C7951 Secondary malignant neoplasm of bone: Secondary | ICD-10-CM | POA: Diagnosis not present

## 2022-01-18 DIAGNOSIS — D630 Anemia in neoplastic disease: Secondary | ICD-10-CM | POA: Diagnosis not present

## 2022-01-19 DIAGNOSIS — R1310 Dysphagia, unspecified: Secondary | ICD-10-CM | POA: Diagnosis not present

## 2022-01-19 DIAGNOSIS — I10 Essential (primary) hypertension: Secondary | ICD-10-CM | POA: Diagnosis not present

## 2022-01-19 DIAGNOSIS — C329 Malignant neoplasm of larynx, unspecified: Secondary | ICD-10-CM | POA: Diagnosis not present

## 2022-01-19 DIAGNOSIS — C7951 Secondary malignant neoplasm of bone: Secondary | ICD-10-CM | POA: Diagnosis not present

## 2022-01-19 DIAGNOSIS — J440 Chronic obstructive pulmonary disease with acute lower respiratory infection: Secondary | ICD-10-CM | POA: Diagnosis not present

## 2022-01-19 DIAGNOSIS — K219 Gastro-esophageal reflux disease without esophagitis: Secondary | ICD-10-CM | POA: Diagnosis not present

## 2022-01-19 DIAGNOSIS — J159 Unspecified bacterial pneumonia: Secondary | ICD-10-CM | POA: Diagnosis not present

## 2022-01-19 DIAGNOSIS — D630 Anemia in neoplastic disease: Secondary | ICD-10-CM | POA: Diagnosis not present

## 2022-01-19 DIAGNOSIS — J9691 Respiratory failure, unspecified with hypoxia: Secondary | ICD-10-CM | POA: Diagnosis not present

## 2022-01-19 DIAGNOSIS — C787 Secondary malignant neoplasm of liver and intrahepatic bile duct: Secondary | ICD-10-CM | POA: Diagnosis not present

## 2022-01-19 DIAGNOSIS — I951 Orthostatic hypotension: Secondary | ICD-10-CM | POA: Diagnosis not present

## 2022-01-19 DIAGNOSIS — E43 Unspecified severe protein-calorie malnutrition: Secondary | ICD-10-CM | POA: Diagnosis not present

## 2022-01-20 ENCOUNTER — Ambulatory Visit: Payer: Medicaid Other | Admitting: Pulmonary Disease

## 2022-01-20 DIAGNOSIS — E43 Unspecified severe protein-calorie malnutrition: Secondary | ICD-10-CM | POA: Diagnosis not present

## 2022-01-20 DIAGNOSIS — J159 Unspecified bacterial pneumonia: Secondary | ICD-10-CM | POA: Diagnosis not present

## 2022-01-20 DIAGNOSIS — C7951 Secondary malignant neoplasm of bone: Secondary | ICD-10-CM | POA: Diagnosis not present

## 2022-01-20 DIAGNOSIS — I951 Orthostatic hypotension: Secondary | ICD-10-CM | POA: Diagnosis not present

## 2022-01-20 DIAGNOSIS — J9691 Respiratory failure, unspecified with hypoxia: Secondary | ICD-10-CM | POA: Diagnosis not present

## 2022-01-20 DIAGNOSIS — C329 Malignant neoplasm of larynx, unspecified: Secondary | ICD-10-CM | POA: Diagnosis not present

## 2022-01-20 DIAGNOSIS — J440 Chronic obstructive pulmonary disease with acute lower respiratory infection: Secondary | ICD-10-CM | POA: Diagnosis not present

## 2022-01-20 DIAGNOSIS — C787 Secondary malignant neoplasm of liver and intrahepatic bile duct: Secondary | ICD-10-CM | POA: Diagnosis not present

## 2022-01-20 DIAGNOSIS — R1310 Dysphagia, unspecified: Secondary | ICD-10-CM | POA: Diagnosis not present

## 2022-01-20 DIAGNOSIS — D630 Anemia in neoplastic disease: Secondary | ICD-10-CM | POA: Diagnosis not present

## 2022-01-20 DIAGNOSIS — I10 Essential (primary) hypertension: Secondary | ICD-10-CM | POA: Diagnosis not present

## 2022-01-20 DIAGNOSIS — K219 Gastro-esophageal reflux disease without esophagitis: Secondary | ICD-10-CM | POA: Diagnosis not present

## 2022-01-21 DIAGNOSIS — E43 Unspecified severe protein-calorie malnutrition: Secondary | ICD-10-CM | POA: Diagnosis not present

## 2022-01-21 DIAGNOSIS — K219 Gastro-esophageal reflux disease without esophagitis: Secondary | ICD-10-CM | POA: Diagnosis not present

## 2022-01-21 DIAGNOSIS — I10 Essential (primary) hypertension: Secondary | ICD-10-CM | POA: Diagnosis not present

## 2022-01-21 DIAGNOSIS — C7951 Secondary malignant neoplasm of bone: Secondary | ICD-10-CM | POA: Diagnosis not present

## 2022-01-21 DIAGNOSIS — C329 Malignant neoplasm of larynx, unspecified: Secondary | ICD-10-CM | POA: Diagnosis not present

## 2022-01-21 DIAGNOSIS — J9691 Respiratory failure, unspecified with hypoxia: Secondary | ICD-10-CM | POA: Diagnosis not present

## 2022-01-21 DIAGNOSIS — D630 Anemia in neoplastic disease: Secondary | ICD-10-CM | POA: Diagnosis not present

## 2022-01-21 DIAGNOSIS — J440 Chronic obstructive pulmonary disease with acute lower respiratory infection: Secondary | ICD-10-CM | POA: Diagnosis not present

## 2022-01-21 DIAGNOSIS — C787 Secondary malignant neoplasm of liver and intrahepatic bile duct: Secondary | ICD-10-CM | POA: Diagnosis not present

## 2022-01-21 DIAGNOSIS — R1310 Dysphagia, unspecified: Secondary | ICD-10-CM | POA: Diagnosis not present

## 2022-01-21 DIAGNOSIS — I951 Orthostatic hypotension: Secondary | ICD-10-CM | POA: Diagnosis not present

## 2022-01-21 DIAGNOSIS — J159 Unspecified bacterial pneumonia: Secondary | ICD-10-CM | POA: Diagnosis not present

## 2022-01-22 DIAGNOSIS — I951 Orthostatic hypotension: Secondary | ICD-10-CM | POA: Diagnosis not present

## 2022-01-22 DIAGNOSIS — R1310 Dysphagia, unspecified: Secondary | ICD-10-CM | POA: Diagnosis not present

## 2022-01-22 DIAGNOSIS — J440 Chronic obstructive pulmonary disease with acute lower respiratory infection: Secondary | ICD-10-CM | POA: Diagnosis not present

## 2022-01-22 DIAGNOSIS — J159 Unspecified bacterial pneumonia: Secondary | ICD-10-CM | POA: Diagnosis not present

## 2022-01-22 DIAGNOSIS — K219 Gastro-esophageal reflux disease without esophagitis: Secondary | ICD-10-CM | POA: Diagnosis not present

## 2022-01-22 DIAGNOSIS — C329 Malignant neoplasm of larynx, unspecified: Secondary | ICD-10-CM | POA: Diagnosis not present

## 2022-01-22 DIAGNOSIS — J9691 Respiratory failure, unspecified with hypoxia: Secondary | ICD-10-CM | POA: Diagnosis not present

## 2022-01-22 DIAGNOSIS — I10 Essential (primary) hypertension: Secondary | ICD-10-CM | POA: Diagnosis not present

## 2022-01-22 DIAGNOSIS — C7951 Secondary malignant neoplasm of bone: Secondary | ICD-10-CM | POA: Diagnosis not present

## 2022-01-22 DIAGNOSIS — E43 Unspecified severe protein-calorie malnutrition: Secondary | ICD-10-CM | POA: Diagnosis not present

## 2022-01-22 DIAGNOSIS — D630 Anemia in neoplastic disease: Secondary | ICD-10-CM | POA: Diagnosis not present

## 2022-01-22 DIAGNOSIS — C787 Secondary malignant neoplasm of liver and intrahepatic bile duct: Secondary | ICD-10-CM | POA: Diagnosis not present

## 2022-01-22 LAB — FUNGAL ORGANISM REFLEX

## 2022-01-22 LAB — FUNGUS CULTURE WITH STAIN

## 2022-01-22 LAB — FUNGUS CULTURE RESULT

## 2022-01-23 DIAGNOSIS — K219 Gastro-esophageal reflux disease without esophagitis: Secondary | ICD-10-CM | POA: Diagnosis not present

## 2022-01-23 DIAGNOSIS — C329 Malignant neoplasm of larynx, unspecified: Secondary | ICD-10-CM | POA: Diagnosis not present

## 2022-01-23 DIAGNOSIS — I951 Orthostatic hypotension: Secondary | ICD-10-CM | POA: Diagnosis not present

## 2022-01-23 DIAGNOSIS — J440 Chronic obstructive pulmonary disease with acute lower respiratory infection: Secondary | ICD-10-CM | POA: Diagnosis not present

## 2022-01-23 DIAGNOSIS — C787 Secondary malignant neoplasm of liver and intrahepatic bile duct: Secondary | ICD-10-CM | POA: Diagnosis not present

## 2022-01-23 DIAGNOSIS — D630 Anemia in neoplastic disease: Secondary | ICD-10-CM | POA: Diagnosis not present

## 2022-01-23 DIAGNOSIS — J159 Unspecified bacterial pneumonia: Secondary | ICD-10-CM | POA: Diagnosis not present

## 2022-01-23 DIAGNOSIS — R1310 Dysphagia, unspecified: Secondary | ICD-10-CM | POA: Diagnosis not present

## 2022-01-23 DIAGNOSIS — E43 Unspecified severe protein-calorie malnutrition: Secondary | ICD-10-CM | POA: Diagnosis not present

## 2022-01-23 DIAGNOSIS — C7951 Secondary malignant neoplasm of bone: Secondary | ICD-10-CM | POA: Diagnosis not present

## 2022-01-23 DIAGNOSIS — I10 Essential (primary) hypertension: Secondary | ICD-10-CM | POA: Diagnosis not present

## 2022-01-23 DIAGNOSIS — J9691 Respiratory failure, unspecified with hypoxia: Secondary | ICD-10-CM | POA: Diagnosis not present

## 2022-01-24 DIAGNOSIS — R1310 Dysphagia, unspecified: Secondary | ICD-10-CM | POA: Diagnosis not present

## 2022-01-24 DIAGNOSIS — C7951 Secondary malignant neoplasm of bone: Secondary | ICD-10-CM | POA: Diagnosis not present

## 2022-01-24 DIAGNOSIS — J159 Unspecified bacterial pneumonia: Secondary | ICD-10-CM | POA: Diagnosis not present

## 2022-01-24 DIAGNOSIS — E43 Unspecified severe protein-calorie malnutrition: Secondary | ICD-10-CM | POA: Diagnosis not present

## 2022-01-24 DIAGNOSIS — K219 Gastro-esophageal reflux disease without esophagitis: Secondary | ICD-10-CM | POA: Diagnosis not present

## 2022-01-24 DIAGNOSIS — I10 Essential (primary) hypertension: Secondary | ICD-10-CM | POA: Diagnosis not present

## 2022-01-24 DIAGNOSIS — I951 Orthostatic hypotension: Secondary | ICD-10-CM | POA: Diagnosis not present

## 2022-01-24 DIAGNOSIS — J440 Chronic obstructive pulmonary disease with acute lower respiratory infection: Secondary | ICD-10-CM | POA: Diagnosis not present

## 2022-01-24 DIAGNOSIS — C329 Malignant neoplasm of larynx, unspecified: Secondary | ICD-10-CM | POA: Diagnosis not present

## 2022-01-24 DIAGNOSIS — D630 Anemia in neoplastic disease: Secondary | ICD-10-CM | POA: Diagnosis not present

## 2022-01-24 DIAGNOSIS — C787 Secondary malignant neoplasm of liver and intrahepatic bile duct: Secondary | ICD-10-CM | POA: Diagnosis not present

## 2022-01-24 DIAGNOSIS — J9691 Respiratory failure, unspecified with hypoxia: Secondary | ICD-10-CM | POA: Diagnosis not present

## 2022-01-25 DIAGNOSIS — C787 Secondary malignant neoplasm of liver and intrahepatic bile duct: Secondary | ICD-10-CM | POA: Diagnosis not present

## 2022-01-25 DIAGNOSIS — R1310 Dysphagia, unspecified: Secondary | ICD-10-CM | POA: Diagnosis not present

## 2022-01-25 DIAGNOSIS — C7951 Secondary malignant neoplasm of bone: Secondary | ICD-10-CM | POA: Diagnosis not present

## 2022-01-25 DIAGNOSIS — C329 Malignant neoplasm of larynx, unspecified: Secondary | ICD-10-CM | POA: Diagnosis not present

## 2022-01-25 DIAGNOSIS — K219 Gastro-esophageal reflux disease without esophagitis: Secondary | ICD-10-CM | POA: Diagnosis not present

## 2022-01-25 DIAGNOSIS — J9691 Respiratory failure, unspecified with hypoxia: Secondary | ICD-10-CM | POA: Diagnosis not present

## 2022-01-25 DIAGNOSIS — J440 Chronic obstructive pulmonary disease with acute lower respiratory infection: Secondary | ICD-10-CM | POA: Diagnosis not present

## 2022-01-25 DIAGNOSIS — D630 Anemia in neoplastic disease: Secondary | ICD-10-CM | POA: Diagnosis not present

## 2022-01-25 DIAGNOSIS — I951 Orthostatic hypotension: Secondary | ICD-10-CM | POA: Diagnosis not present

## 2022-01-25 DIAGNOSIS — I10 Essential (primary) hypertension: Secondary | ICD-10-CM | POA: Diagnosis not present

## 2022-01-25 DIAGNOSIS — J159 Unspecified bacterial pneumonia: Secondary | ICD-10-CM | POA: Diagnosis not present

## 2022-01-25 DIAGNOSIS — E43 Unspecified severe protein-calorie malnutrition: Secondary | ICD-10-CM | POA: Diagnosis not present

## 2022-01-26 DIAGNOSIS — C329 Malignant neoplasm of larynx, unspecified: Secondary | ICD-10-CM | POA: Diagnosis not present

## 2022-01-26 DIAGNOSIS — E43 Unspecified severe protein-calorie malnutrition: Secondary | ICD-10-CM | POA: Diagnosis not present

## 2022-01-26 DIAGNOSIS — I10 Essential (primary) hypertension: Secondary | ICD-10-CM | POA: Diagnosis not present

## 2022-01-26 DIAGNOSIS — K219 Gastro-esophageal reflux disease without esophagitis: Secondary | ICD-10-CM | POA: Diagnosis not present

## 2022-01-26 DIAGNOSIS — J159 Unspecified bacterial pneumonia: Secondary | ICD-10-CM | POA: Diagnosis not present

## 2022-01-26 DIAGNOSIS — J9691 Respiratory failure, unspecified with hypoxia: Secondary | ICD-10-CM | POA: Diagnosis not present

## 2022-01-26 DIAGNOSIS — J440 Chronic obstructive pulmonary disease with acute lower respiratory infection: Secondary | ICD-10-CM | POA: Diagnosis not present

## 2022-01-26 DIAGNOSIS — D630 Anemia in neoplastic disease: Secondary | ICD-10-CM | POA: Diagnosis not present

## 2022-01-26 DIAGNOSIS — C787 Secondary malignant neoplasm of liver and intrahepatic bile duct: Secondary | ICD-10-CM | POA: Diagnosis not present

## 2022-01-26 DIAGNOSIS — R1310 Dysphagia, unspecified: Secondary | ICD-10-CM | POA: Diagnosis not present

## 2022-01-26 DIAGNOSIS — I951 Orthostatic hypotension: Secondary | ICD-10-CM | POA: Diagnosis not present

## 2022-01-26 DIAGNOSIS — C7951 Secondary malignant neoplasm of bone: Secondary | ICD-10-CM | POA: Diagnosis not present

## 2022-01-27 DIAGNOSIS — I951 Orthostatic hypotension: Secondary | ICD-10-CM | POA: Diagnosis not present

## 2022-01-27 DIAGNOSIS — C787 Secondary malignant neoplasm of liver and intrahepatic bile duct: Secondary | ICD-10-CM | POA: Diagnosis not present

## 2022-01-27 DIAGNOSIS — C7951 Secondary malignant neoplasm of bone: Secondary | ICD-10-CM | POA: Diagnosis not present

## 2022-01-27 DIAGNOSIS — J9691 Respiratory failure, unspecified with hypoxia: Secondary | ICD-10-CM | POA: Diagnosis not present

## 2022-01-27 DIAGNOSIS — K219 Gastro-esophageal reflux disease without esophagitis: Secondary | ICD-10-CM | POA: Diagnosis not present

## 2022-01-27 DIAGNOSIS — C329 Malignant neoplasm of larynx, unspecified: Secondary | ICD-10-CM | POA: Diagnosis not present

## 2022-01-27 DIAGNOSIS — I10 Essential (primary) hypertension: Secondary | ICD-10-CM | POA: Diagnosis not present

## 2022-01-27 DIAGNOSIS — D630 Anemia in neoplastic disease: Secondary | ICD-10-CM | POA: Diagnosis not present

## 2022-01-27 DIAGNOSIS — E43 Unspecified severe protein-calorie malnutrition: Secondary | ICD-10-CM | POA: Diagnosis not present

## 2022-01-27 DIAGNOSIS — J440 Chronic obstructive pulmonary disease with acute lower respiratory infection: Secondary | ICD-10-CM | POA: Diagnosis not present

## 2022-01-27 DIAGNOSIS — R1310 Dysphagia, unspecified: Secondary | ICD-10-CM | POA: Diagnosis not present

## 2022-01-27 DIAGNOSIS — J159 Unspecified bacterial pneumonia: Secondary | ICD-10-CM | POA: Diagnosis not present

## 2022-01-28 DIAGNOSIS — R1310 Dysphagia, unspecified: Secondary | ICD-10-CM | POA: Diagnosis not present

## 2022-01-28 DIAGNOSIS — C7951 Secondary malignant neoplasm of bone: Secondary | ICD-10-CM | POA: Diagnosis not present

## 2022-01-28 DIAGNOSIS — D630 Anemia in neoplastic disease: Secondary | ICD-10-CM | POA: Diagnosis not present

## 2022-01-28 DIAGNOSIS — C787 Secondary malignant neoplasm of liver and intrahepatic bile duct: Secondary | ICD-10-CM | POA: Diagnosis not present

## 2022-01-28 DIAGNOSIS — C329 Malignant neoplasm of larynx, unspecified: Secondary | ICD-10-CM | POA: Diagnosis not present

## 2022-01-28 DIAGNOSIS — I10 Essential (primary) hypertension: Secondary | ICD-10-CM | POA: Diagnosis not present

## 2022-01-28 DIAGNOSIS — I951 Orthostatic hypotension: Secondary | ICD-10-CM | POA: Diagnosis not present

## 2022-01-28 DIAGNOSIS — J9691 Respiratory failure, unspecified with hypoxia: Secondary | ICD-10-CM | POA: Diagnosis not present

## 2022-01-28 DIAGNOSIS — J159 Unspecified bacterial pneumonia: Secondary | ICD-10-CM | POA: Diagnosis not present

## 2022-01-28 DIAGNOSIS — J440 Chronic obstructive pulmonary disease with acute lower respiratory infection: Secondary | ICD-10-CM | POA: Diagnosis not present

## 2022-01-28 DIAGNOSIS — K219 Gastro-esophageal reflux disease without esophagitis: Secondary | ICD-10-CM | POA: Diagnosis not present

## 2022-01-28 DIAGNOSIS — E43 Unspecified severe protein-calorie malnutrition: Secondary | ICD-10-CM | POA: Diagnosis not present

## 2022-01-29 ENCOUNTER — Other Ambulatory Visit: Payer: Self-pay | Admitting: Adult Health

## 2022-01-29 DIAGNOSIS — J159 Unspecified bacterial pneumonia: Secondary | ICD-10-CM | POA: Diagnosis not present

## 2022-01-29 DIAGNOSIS — C787 Secondary malignant neoplasm of liver and intrahepatic bile duct: Secondary | ICD-10-CM | POA: Diagnosis not present

## 2022-01-29 DIAGNOSIS — C329 Malignant neoplasm of larynx, unspecified: Secondary | ICD-10-CM | POA: Diagnosis not present

## 2022-01-29 DIAGNOSIS — C7951 Secondary malignant neoplasm of bone: Secondary | ICD-10-CM | POA: Diagnosis not present

## 2022-01-29 DIAGNOSIS — K219 Gastro-esophageal reflux disease without esophagitis: Secondary | ICD-10-CM | POA: Diagnosis not present

## 2022-01-29 DIAGNOSIS — E43 Unspecified severe protein-calorie malnutrition: Secondary | ICD-10-CM | POA: Diagnosis not present

## 2022-01-29 DIAGNOSIS — I10 Essential (primary) hypertension: Secondary | ICD-10-CM | POA: Diagnosis not present

## 2022-01-29 DIAGNOSIS — R1310 Dysphagia, unspecified: Secondary | ICD-10-CM | POA: Diagnosis not present

## 2022-01-29 DIAGNOSIS — D630 Anemia in neoplastic disease: Secondary | ICD-10-CM | POA: Diagnosis not present

## 2022-01-29 DIAGNOSIS — J9691 Respiratory failure, unspecified with hypoxia: Secondary | ICD-10-CM | POA: Diagnosis not present

## 2022-01-29 DIAGNOSIS — J440 Chronic obstructive pulmonary disease with acute lower respiratory infection: Secondary | ICD-10-CM | POA: Diagnosis not present

## 2022-01-29 DIAGNOSIS — I951 Orthostatic hypotension: Secondary | ICD-10-CM | POA: Diagnosis not present

## 2022-01-30 ENCOUNTER — Telehealth (INDEPENDENT_AMBULATORY_CARE_PROVIDER_SITE_OTHER): Payer: Self-pay | Admitting: Family Medicine

## 2022-01-30 ENCOUNTER — Telehealth: Payer: Self-pay | Admitting: Family Medicine

## 2022-01-30 DIAGNOSIS — D630 Anemia in neoplastic disease: Secondary | ICD-10-CM | POA: Diagnosis not present

## 2022-01-30 DIAGNOSIS — C787 Secondary malignant neoplasm of liver and intrahepatic bile duct: Secondary | ICD-10-CM | POA: Diagnosis not present

## 2022-01-30 DIAGNOSIS — E43 Unspecified severe protein-calorie malnutrition: Secondary | ICD-10-CM | POA: Diagnosis not present

## 2022-01-30 DIAGNOSIS — J440 Chronic obstructive pulmonary disease with acute lower respiratory infection: Secondary | ICD-10-CM | POA: Diagnosis not present

## 2022-01-30 DIAGNOSIS — I951 Orthostatic hypotension: Secondary | ICD-10-CM | POA: Diagnosis not present

## 2022-01-30 DIAGNOSIS — R1310 Dysphagia, unspecified: Secondary | ICD-10-CM | POA: Diagnosis not present

## 2022-01-30 DIAGNOSIS — J159 Unspecified bacterial pneumonia: Secondary | ICD-10-CM | POA: Diagnosis not present

## 2022-01-30 DIAGNOSIS — C7951 Secondary malignant neoplasm of bone: Secondary | ICD-10-CM | POA: Diagnosis not present

## 2022-01-30 DIAGNOSIS — C329 Malignant neoplasm of larynx, unspecified: Secondary | ICD-10-CM | POA: Diagnosis not present

## 2022-01-30 DIAGNOSIS — I10 Essential (primary) hypertension: Secondary | ICD-10-CM | POA: Diagnosis not present

## 2022-01-30 DIAGNOSIS — J9691 Respiratory failure, unspecified with hypoxia: Secondary | ICD-10-CM | POA: Diagnosis not present

## 2022-01-30 DIAGNOSIS — K219 Gastro-esophageal reflux disease without esophagitis: Secondary | ICD-10-CM | POA: Diagnosis not present

## 2022-02-12 LAB — MAC SUSCEPTIBILITY BROTH
Ciprofloxacin: 8
Clarithromycin: 1
Doxycycline: >8
Linezolid: 8
Minocycline: >8
Moxifloxacin: 1
Rifabutin: 0.25
Rifampin: 2
Streptomycin: >32

## 2022-02-12 LAB — ACID FAST CULTURE WITH REFLEXED SENSITIVITIES (MYCOBACTERIA): Acid Fast Culture: POSITIVE — AB

## 2022-02-12 LAB — AFB ORGANISM ID BY DNA PROBE
M avium complex: POSITIVE — AB
M tuberculosis complex: NEGATIVE

## 2022-02-26 NOTE — Telephone Encounter (Signed)
[place] wanting to report time of death for pt today @ 2022-02-20 8:10 am by Izora Gala for Dr Margarita Rana

## 2022-02-26 NOTE — Telephone Encounter (Signed)
Noted  

## 2022-02-26 NOTE — Telephone Encounter (Signed)
Copied from [place] [id number]. Topic: General - Deceased Patient >> 02-13-2022  8:52 AM Loma [place] wrote: Reason for CRM: Neila Gear calling to report time of death @ 8:10 am on 02-13-22 [phone]  Route to department's PEC Pool.

## 2022-02-26 NOTE — Telephone Encounter (Signed)
FYI

## 2022-02-26 DEATH — deceased

## 2022-03-25 ENCOUNTER — Ambulatory Visit: Payer: Medicaid Other | Admitting: Family Medicine

## 2022-03-25 ENCOUNTER — Ambulatory Visit: Payer: Self-pay | Admitting: Radiation Oncology

## 2023-09-14 ENCOUNTER — Other Ambulatory Visit (HOSPITAL_COMMUNITY): Payer: Self-pay

## 2024-06-09 ENCOUNTER — Telehealth: Payer: Self-pay | Admitting: Radiation Oncology

## 2024-06-09 NOTE — Telephone Encounter (Signed)
 Received medical record request from [facility]. Faxed records and forwarded request to [employer].
